# Patient Record
Sex: Female | Born: 1935
Health system: Southern US, Community
[De-identification: ages and names within clinical notes are randomized; demographics above are authoritative.]

## PROBLEM LIST (undated history)

## (undated) DIAGNOSIS — G4733 Obstructive sleep apnea (adult) (pediatric): Secondary | ICD-10-CM

## (undated) DIAGNOSIS — M069 Rheumatoid arthritis, unspecified: Secondary | ICD-10-CM

## (undated) DIAGNOSIS — M810 Age-related osteoporosis without current pathological fracture: Secondary | ICD-10-CM

## (undated) DIAGNOSIS — Z7901 Long term (current) use of anticoagulants: Secondary | ICD-10-CM

## (undated) DIAGNOSIS — Z8679 Personal history of other diseases of the circulatory system: Secondary | ICD-10-CM

## (undated) DIAGNOSIS — F419 Anxiety disorder, unspecified: Secondary | ICD-10-CM

## (undated) DIAGNOSIS — E119 Type 2 diabetes mellitus without complications: Secondary | ICD-10-CM

## (undated) DIAGNOSIS — Z8619 Personal history of other infectious and parasitic diseases: Secondary | ICD-10-CM

## (undated) DIAGNOSIS — J45909 Unspecified asthma, uncomplicated: Secondary | ICD-10-CM

## (undated) DIAGNOSIS — K279 Peptic ulcer, site unspecified, unspecified as acute or chronic, without hemorrhage or perforation: Secondary | ICD-10-CM

## (undated) DIAGNOSIS — I209 Angina pectoris, unspecified: Secondary | ICD-10-CM

## (undated) DIAGNOSIS — Z950 Presence of cardiac pacemaker: Secondary | ICD-10-CM

## (undated) DIAGNOSIS — E785 Hyperlipidemia, unspecified: Secondary | ICD-10-CM

## (undated) DIAGNOSIS — I4891 Unspecified atrial fibrillation: Secondary | ICD-10-CM

## (undated) DIAGNOSIS — F32A Depression, unspecified: Secondary | ICD-10-CM

## (undated) DIAGNOSIS — R0600 Dyspnea, unspecified: Secondary | ICD-10-CM

## (undated) DIAGNOSIS — I509 Heart failure, unspecified: Secondary | ICD-10-CM

## (undated) DIAGNOSIS — I2510 Atherosclerotic heart disease of native coronary artery without angina pectoris: Secondary | ICD-10-CM

## (undated) DIAGNOSIS — I10 Essential (primary) hypertension: Secondary | ICD-10-CM

## (undated) HISTORY — PX: OTHER SURGICAL HISTORY: SHX169

## (undated) HISTORY — DX: Presence of cardiac pacemaker: Z95.0

## (undated) HISTORY — PX: PARTIAL GASTRECTOMY: SHX2172

## (undated) HISTORY — PX: APPENDECTOMY: SHX54

## (undated) HISTORY — PX: ABDOMINAL HYSTERECTOMY: SHX81

## (undated) HISTORY — PX: CHOLECYSTECTOMY: SHX55

## (undated) HISTORY — DX: Personal history of other diseases of the circulatory system: Z86.79

## (undated) HISTORY — PX: ANKLE SURGERY: SHX546

## (undated) HISTORY — DX: Unspecified asthma, uncomplicated: J45.909

## (undated) HISTORY — PX: REPLACEMENT TOTAL KNEE: SUR1224

## (undated) HISTORY — DX: Age-related osteoporosis without current pathological fracture: M81.0

## (undated) HISTORY — DX: Essential (primary) hypertension: I10

## (undated) HISTORY — DX: Type 2 diabetes mellitus without complications: E11.9

## (undated) HISTORY — DX: Peptic ulcer, site unspecified, unspecified as acute or chronic, without hemorrhage or perforation: K27.9

## (undated) HISTORY — DX: Rheumatoid arthritis, unspecified: M06.9

## (undated) HISTORY — DX: Hyperlipidemia, unspecified: E78.5

## (undated) HISTORY — DX: Unspecified atrial fibrillation: I48.91

## (undated) HISTORY — DX: Atherosclerotic heart disease of native coronary artery without angina pectoris: I25.10

---

## 2010-06-25 LAB — PACEMAKER DEVICE OBSERVATION

## 2011-05-16 LAB — BASIC METABOLIC PANEL
BUN: 19 mg/dL (ref 4–21)
Creatinine: 1.2 mg/dL — AB (ref <=1.1)

## 2011-05-16 LAB — LIPID PANEL
Cholesterol: 218 mg/dL — AB (ref 0–200)
HDL: 53 mg/dL (ref 35–70)

## 2011-09-27 ENCOUNTER — Encounter: Payer: Self-pay | Admitting: Family Medicine

## 2011-09-27 ENCOUNTER — Ambulatory Visit (INDEPENDENT_AMBULATORY_CARE_PROVIDER_SITE_OTHER): Payer: Medicare Other | Admitting: Family Medicine

## 2011-09-27 VITALS — BP 118/73 | HR 73 | Temp 98.5°F | Resp 18 | Wt 229.0 lb

## 2011-09-27 DIAGNOSIS — M069 Rheumatoid arthritis, unspecified: Secondary | ICD-10-CM

## 2011-09-27 DIAGNOSIS — I4891 Unspecified atrial fibrillation: Secondary | ICD-10-CM

## 2011-09-27 DIAGNOSIS — I2510 Atherosclerotic heart disease of native coronary artery without angina pectoris: Secondary | ICD-10-CM | POA: Insufficient documentation

## 2011-09-27 DIAGNOSIS — M810 Age-related osteoporosis without current pathological fracture: Secondary | ICD-10-CM

## 2011-09-27 DIAGNOSIS — Z8739 Personal history of other diseases of the musculoskeletal system and connective tissue: Secondary | ICD-10-CM

## 2011-09-27 DIAGNOSIS — G4730 Sleep apnea, unspecified: Secondary | ICD-10-CM

## 2011-09-27 DIAGNOSIS — R509 Fever, unspecified: Secondary | ICD-10-CM

## 2011-09-27 DIAGNOSIS — E785 Hyperlipidemia, unspecified: Secondary | ICD-10-CM

## 2011-09-27 DIAGNOSIS — Z8679 Personal history of other diseases of the circulatory system: Secondary | ICD-10-CM

## 2011-09-27 DIAGNOSIS — I10 Essential (primary) hypertension: Secondary | ICD-10-CM

## 2011-09-27 DIAGNOSIS — Z950 Presence of cardiac pacemaker: Secondary | ICD-10-CM

## 2011-09-27 DIAGNOSIS — N390 Urinary tract infection, site not specified: Secondary | ICD-10-CM

## 2011-09-27 DIAGNOSIS — I4821 Permanent atrial fibrillation: Secondary | ICD-10-CM | POA: Insufficient documentation

## 2011-09-27 HISTORY — DX: Age-related osteoporosis without current pathological fracture: M81.0

## 2011-09-27 HISTORY — DX: Essential (primary) hypertension: I10

## 2011-09-27 HISTORY — DX: Rheumatoid arthritis, unspecified: M06.9

## 2011-09-27 HISTORY — DX: Atherosclerotic heart disease of native coronary artery without angina pectoris: I25.10

## 2011-09-27 HISTORY — DX: Unspecified atrial fibrillation: I48.91

## 2011-09-27 HISTORY — DX: Personal history of other diseases of the circulatory system: Z86.79

## 2011-09-27 HISTORY — DX: Sleep apnea, unspecified: G47.30

## 2011-09-27 HISTORY — DX: Hyperlipidemia, unspecified: E78.5

## 2011-09-27 LAB — POCT URINALYSIS DIPSTICK
Bilirubin, UA: NEGATIVE
Nitrite, UA: NEGATIVE
Protein, UA: 100
pH, UA: 6

## 2011-09-27 MED ORDER — ONDANSETRON 4 MG PO TBDP: 4.0000 mg | ORAL_TABLET | Freq: Three times a day (TID) | ORAL | Status: AC | PRN

## 2011-09-27 MED ORDER — NITROFURANTOIN MONOHYD MACRO 100 MG PO CAPS: 100.0000 mg | ORAL_CAPSULE | Freq: Two times a day (BID) | ORAL | Status: AC

## 2011-09-27 NOTE — Progress Notes (Signed)
CC: Elisama Cervantes is a 78 y.o. female is here for Diarrhea and Fatigue   Subjective: HPI: Patient and the area, former patient of Dr. Marquita Palms in [facility]. Accompanied by her daughter. Complaints of fever as high as 102.0, diarrhea, decreased appetite, mild nausea, pain in the low abdomen, and urination "feels like it should be uncomfortable". Daughter describes the patient being more sleepy and somewhat confused. This is been lasting for last 3 days and today seems somewhat better from a cognitive standpoint but other symptoms persist. No interventions as of yet. Nothing seems to make better or worse. Daughter suspicious of urinary tract infection based on past symptoms with urinary tract infections. Patient denies motor or sensory disturbances, chest pain, shortness of breath, skin breakdown or rash, blood in stool, back pain. The patient denies being in a medical facility or community in the last 2 weeks.  Past medical history is mostly unknown by the patient and the daughter, additionally they're somewhat confused about what medications she takes however we're able to get this from the local pharmacy.  There unsure about the specifics with respect to the Levaquin allergy, however she states penicillin put her in a coma when she was 17. She's not been on a recent antibiotics.   Review Of Systems Outlined In HPI  History reviewed. No pertinent past medical history.   History reviewed. No pertinent family history.   History  Substance Use Topics  . Smoking status: Not on file  . Smokeless tobacco: Not on file  . Alcohol Use:      Objective: Filed Vitals:   09/27/11 0948  BP: 118/73  Pulse: 73  Temp: 98.5 F (36.9 C)  Resp: 18    General: Alert and Oriented, No Acute Distress HEENT: Pupils equal, round, reactive to light. Conjunctivae clear.  External ears unremarkable, canals clear with intact TMs with appropriate landmarks.  Middle ear appears open without  effusion. Pink inferior turbinates.  Moist mucous membranes, pharynx without inflammation nor lesions.  Neck supple without palpable lymphadenopathy nor abnormal masses. 3 cm vertical-linear scar over the right temporal artery. Lungs: Clear to auscultation bilaterally, no wheezing/ronchi/rales.  Comfortable work of breathing. Good air movement. Cardiac: Regular rate and rhythm. Normal S1/S2.  No murmurs, rubs, nor gallops.   Abdomen: Normal bowel sounds, soft and mild suprapubic tenderness without palpable masses. Extremities: No peripheral edema.  Strong peripheral pulses.  Mental Status: No depression, anxiety, nor agitation. Mild confusion with respect to recent events. Skin: Warm and dry.  Assessment & Plan: Jayani was seen today for diarrhea and fatigue.  Diagnoses and associated orders for this visit:  Fever - POCT urinalysis dipstick  Uti (lower urinary tract infection) - nitrofurantoin, macrocrystal-monohydrate, (MACROBID) 100 MG capsule; Take 1 capsule (100 mg total) by mouth 2 (two) times daily. - ondansetron (ZOFRAN ODT) 4 MG disintegrating tablet; Take 1 tablet (4 mg total) by mouth every 8 (eight) hours as needed for nausea. - Urine culture   Urine dipstick shows large blood and leukocytes. We'll send for culture. Antibiotics complicate compared therapy therefore Macrobid until sensitivities none, patient believes fosfomycin caused complications in the past. Will call patient on Monday with culture results and sensitivities. Zofran for nausea to ensure she takes the above antibiotic.Signs and symptoms requring emergent/urgent reevaluation were discussed with the patient. 30 minutes spent in face-to-face visit today of which at least 50% was counseling or coordinating care.    Return if symptoms worsen or fail to improve.  Requested Prescriptions  Signed Prescriptions Disp Refills  . nitrofurantoin, macrocrystal-monohydrate, (MACROBID) 100 MG capsule 14 capsule 0    Sig:  Take 1 capsule (100 mg total) by mouth 2 (two) times daily.  . ondansetron (ZOFRAN ODT) 4 MG disintegrating tablet 20 tablet 0    Sig: Take 1 tablet (4 mg total) by mouth every 8 (eight) hours as needed for nausea.

## 2011-09-29 LAB — URINE CULTURE: Colony Count: >=100000

## 2011-09-30 ENCOUNTER — Telehealth: Payer: Self-pay | Admitting: Family Medicine

## 2011-09-30 MED ORDER — FOSFOMYCIN TROMETHAMINE 3 G PO PACK: 3.0000 g | PACK | Freq: Once | ORAL | Status: DC

## 2011-09-30 NOTE — Telephone Encounter (Signed)
I spoke with her daughter to advise her about the prescription. She states the medication needs a prior auth.

## 2011-09-30 NOTE — Telephone Encounter (Signed)
Marylene Land, I'm able to e-scribe now so I sent this to [facility] pharmacy, would you please let the patient or her daughter know.  Thank you very much.

## 2011-09-30 NOTE — Telephone Encounter (Signed)
Discussed with daughter that Levie is feeling somewhat better and that the urine culture showed multiple species.  Encouraged patient last week and today to use fosfomycin if not significantly improved over the weekend.  Daughter requesting Rx today in order to price it.

## 2011-10-01 ENCOUNTER — Ambulatory Visit (INDEPENDENT_AMBULATORY_CARE_PROVIDER_SITE_OTHER): Payer: Medicare Other | Admitting: Family Medicine

## 2011-10-01 ENCOUNTER — Encounter: Payer: Self-pay | Admitting: Family Medicine

## 2011-10-01 VITALS — BP 126/64 | HR 64 | Temp 98.2°F | Resp 18

## 2011-10-01 DIAGNOSIS — B029 Zoster without complications: Secondary | ICD-10-CM | POA: Insufficient documentation

## 2011-10-01 DIAGNOSIS — N390 Urinary tract infection, site not specified: Secondary | ICD-10-CM

## 2011-10-01 MED ORDER — ACYCLOVIR 400 MG PO TABS: 800.0000 mg | ORAL_TABLET | Freq: Every day | ORAL | Status: AC

## 2011-10-01 NOTE — Progress Notes (Signed)
CC: Katherine Cervantes is a 78 y.o. female is here for Fatigue   Subjective: HPI:  Patient presents for followup of urinary tract infection was diagnosed last week. Due to complicating allergies she was given Macrobid while pending the culture unfortunately the culture was unable to speciate a single bacteria. She reports that the prior nausea, diarrhea, high fevers are now resolved. Additionally flank pain has improved but is still mildly present. She reports low-grade fevers without any objective measurements. She admits that she was quite confused at her last visit and feels that her mentation has improved. I called her former PCP Dr. blank and after talking with his his assistant confirmed that she's not a candidate for Cipro, cephalosporins, sulfas, penicillins, date uncertain reactions in the past that the patient describes as all life threatening. The past 2 days he been waiting for her insurance company to help [place] with a prior authorization for fosfomycin.  She denies chills, shortness of breath, confusion, or dizziness, nausea, dysuria, or change in color or odor consistency of her urine.    she brings to my attention ulcers on her right upper lip I cannot know where 2 days ago. They described as a tingly irritation. She is never had a similar rash before but admits to having shingles many years ago. She is taking methotrexate but no other immunosuppression. She denies any trauma to the site of the these lesions. She denies any pain in her mouth, pain in her ears, ocular pain, nor new neuro or motor or sensory disturbances.   Review Of Systems Outlined In HPI  No past medical history on file.   No family history on file.   History  Substance Use Topics  . Smoking status: Never Smoker   . Smokeless tobacco: Never Used  . Alcohol Use: Not on file     Objective: Filed Vitals:   10/01/11 1145  BP: 126/64  Pulse: 64  Temp: 98.2 F (36.8 C)  Resp: 18    General: Alert and  Oriented, No Acute Distress HEENT: Pupils equal, round, reactive to light. Conjunctivae clear.  External ears unremarkable, canals clear with intact TMs with appropriate landmarks.  Middle ear appears open without effusion. Pink inferior turbinates.  Moist mucous membranes, pharynx without inflammation nor lesions.  Neck supple without palpable lymphadenopathy nor abnormal masses. Right upper lip has clusters of vesicular lesions with erythematous bases not involving the lip. Lungs: Clear to auscultation bilaterally, no wheezing/ronchi/rales.  Comfortable work of breathing. Good air movement. Cardiac: Irregularly irregular rhythm. Normal S1/S2.  No murmurs, rubs, nor gallops.   Back: no midline tenderness, mild CVA tenderness  Mental Status: No depression, anxiety, nor agitation. Alert and oriented to person/place/time Neuro: CN II-XII grossly intact Skin: Warm and dry.  Assessment & Plan: Katherine Cervantes was seen today for fatigue.  Diagnoses and associated orders for this visit:  Shingles outbreak - acyclovir (ZOVIRAX) 400 MG tablet; Take 2 tablets (800 mg total) by mouth 5 (five) times daily.  Uti (lower urinary tract infection)    encourage the patient to consider cath urine culture in order to attempt to speciate and sens to possibly urinary pathogen, discussed but since she's on Macrobid this may complicate the culture. Expressed my concern of lack of antibiotics to use an outpatient and that can guarantee if we get fosfomycin cleared for her that this would treat her 100%. She declined the cath is agreeable to going to the emergency room for any decline in her health which was outlined  in the patient's instruction handout. Additionally discussed with her the diagnosis of shingles in signs and symptoms that require emergent evaluation given her methotrexate use, patient expresses understanding and agreement. Asked her to return on Friday to see how she is progressing before the weekend. Of note  patient's cognitive ability is much improved at today's visit she drastically shows much more insight into her health conditions acutely and chronically.  Return in about 3 days (around 10/04/2011).  Requested Prescriptions   Signed Prescriptions Disp Refills  . acyclovir (ZOVIRAX) 400 MG tablet 70 tablet 0    Sig: Take 2 tablets (800 mg total) by mouth 5 (five) times daily.

## 2011-10-01 NOTE — Telephone Encounter (Signed)
Medication has been approved and patient's daughter is aware.

## 2011-10-01 NOTE — Patient Instructions (Addendum)
Start acyclovir as soon as possible for a total of seven days to help with the rash (shingles) on your upper lip.  If you develop a rash near or on your eye, or approaching your eye, report to the emergency room.  Hold off on methotrexate this week.  If you develop a fever > 100.3, vomiting, confusion, or worsening back/side pain it is imperative that you report to the emergency room because I worry that your UTI could potentially be entering your blood stream.  If we get clearance to Rx you fosfomycin I will call this in ASAP, for now continue with Macrobid.

## 2011-10-04 ENCOUNTER — Ambulatory Visit (INDEPENDENT_AMBULATORY_CARE_PROVIDER_SITE_OTHER): Payer: Medicare Other | Admitting: Family Medicine

## 2011-10-04 ENCOUNTER — Encounter: Payer: Self-pay | Admitting: Family Medicine

## 2011-10-04 VITALS — BP 123/67

## 2011-10-04 DIAGNOSIS — M069 Rheumatoid arthritis, unspecified: Secondary | ICD-10-CM

## 2011-10-04 DIAGNOSIS — N12 Tubulo-interstitial nephritis, not specified as acute or chronic: Secondary | ICD-10-CM

## 2011-10-04 MED ORDER — TRAMADOL-ACETAMINOPHEN 37.5-325 MG PO TABS: 1.0000 | ORAL_TABLET | Freq: Three times a day (TID) | ORAL | Status: DC | PRN

## 2011-10-04 NOTE — Patient Instructions (Signed)
I strongly encourage you to go to the Emergency Room for a possible CT Scan to rule out the possibility of pyelonephritis, I'm limited about what antibiotics I can treat you with as an outpatient due to your allergy list.     Pyelonephritis, Adult Pyelonephritis is a kidney infection. In general, there are 2 main types of pyelonephritis:  Infections that come on quickly without any warning (acute pyelonephritis).   Infections that persist for a long period of time (chronic pyelonephritis).  CAUSES  Two main causes of pyelonephritis are:  Bacteria traveling from the bladder to the kidney. This is a problem especially in pregnant women. The urine in the bladder can become filled with bacteria from multiple causes, including:   Inflammation of the prostate gland (prostatitis).   Sexual intercourse in females.   Bladder infection (cystitis).   Bacteria traveling from the bloodstream to the tissue part of the kidney.  Problems that may increase your risk of getting a kidney infection include:  Diabetes.   Kidney stones or bladder stones.   Cancer.   Catheters placed in the bladder.   Other abnormalities of the kidney or ureter.  SYMPTOMS   Abdominal pain.   Pain in the side or flank area.   Fever.   Chills.   Upset stomach.   Blood in the urine (dark urine).   Frequent urination.   Strong or persistent urge to urinate.   Burning or stinging when urinating.  DIAGNOSIS  Your caregiver may diagnose your kidney infection based on your symptoms. A urine sample may also be taken. TREATMENT  In general, treatment depends on how severe the infection is.   If the infection is mild and caught early, your caregiver may treat you with oral antibiotics and send you home.   If the infection is more severe, the bacteria may have gotten into the bloodstream. This will require intravenous (IV) antibiotics and a hospital stay. Symptoms may include:   High fever.   Severe  flank pain.   Shaking chills.   Even after a hospital stay, your caregiver may require you to be on oral antibiotics for a period of time.   Other treatments may be required depending upon the cause of the infection.  HOME CARE INSTRUCTIONS   Take your antibiotics as directed. Finish them even if you start to feel better.   Make an appointment to have your urine checked to make sure the infection is gone.   Drink enough fluids to keep your urine clear or pale yellow.   Take medicines for the bladder if you have urgency and frequency of urination as directed by your caregiver.  SEEK IMMEDIATE MEDICAL CARE IF:   You have a fever.   You are unable to take your antibiotics or fluids.   You develop shaking chills.   You experience extreme weakness or fainting.   There is no improvement after 2 days of treatment.  MAKE SURE YOU:  Understand these instructions.   Will watch your condition.   Will get help right away if you are not doing well or get worse.  Document Released: 01/28/2005 Document Revised: 01/17/2011 Document Reviewed: 07/04/2010 [facility] Patient Information 2012 [place], [place].

## 2011-10-04 NOTE — Progress Notes (Signed)
CC: Katherine Cervantes is a 78 y.o. female is here for Back Pain   Subjective: HPI:  Patient presents with her daughter Katherine Cervantes, for followup urinary tract infection. Initially treated last week with Macrobid, unfortunately the culture contained multiple species and Katherine Cervantes have reservations about her repeat culture by catheterization when she returned on Tuesday of this week. We're able to get her a dose of fosfomycin which she took on Tuesday. On Tuesday she reported symptomatically improvement including lack of sweats improve cognition but still had some bilateral flank pain. Daughter brings her in today at the patient's cognition has been waxing and waning since Tuesday, she's been experiencing night sweats, has been getting chills throughout the day. Patient reports her flank pain is somewhat worse, present 24 hours a day, and often makes her want to just lie down. She denies any spasm component to her flank pain. She's unsure of any change in the color or consistency of her urine. She denies any shortness of breath or pulmonary complaints, chest pain, abdominal pain, GI distress, nor any new skin lesions.  She requests a refill on Ultracet for her rheumatoid arthritis. She's not taking methotrexate for now is a believe she has an active infection.   Review Of Systems Outlined In HPI  No past medical history on file.   No family history on file.   History  Substance Use Topics  . Smoking status: Never Smoker   . Smokeless tobacco: Never Used  . Alcohol Use: Not on file     Objective: Filed Vitals:   10/04/11 1133  BP: 123/67    General: Alert and Oriented, No Acute Distress HEENT: Pupils equal, round, reactive to light. Conjunctivae clear. Moist mucous membranes, pharynx without inflammation nor lesions.  Neck supple without palpable lymphadenopathy nor abnormal masses. Lungs: Clear to auscultation bilaterally, no wheezing/ronchi/rales.  Comfortable work of breathing. Good air  movement. Cardiac: Irregularly irregular rhythm. Normal S1/S2.  No murmurs, rubs, nor gallops.   Abdomen: Normal bowel sounds, soft and non tender without palpable masses. Extremities: 1+ pitting edema in ankles bilaterally..  Strong peripheral pulses.  Back: CVA tenderness bilaterally, no midline tenderness, no soft tissue tenderness to firm palpation in the paraspinal muscles. Mental Status: No depression, anxiety, nor agitation. Skin: Warm and dry.  Assessment & Plan: Katherine Cervantes was seen today for back pain.  Diagnoses and associated orders for this visit:  Pyelonephritis  Rheumatoid arthritis - traMADol-acetaminophen (ULTRACET) 37.5-325 MG per tablet; Take 1 tablet by mouth every 8 (eight) hours as needed (arthritis pain).    Had a lengthy discussion with Katherine Cervantes in the patient regarding my concerns for pyelonephritis and lack of outpatient medications that would be successful in treating this given her extensive allergy list. I like to get a CT scan to confirm this diagnosis however my clinical suspicion of pyelonephritis is high enough that I believe she warrants inpatient therapy, therefore I strongly encouraged the patient and her daughter to go to the emergency room for urgent evaluation. The patient is stable enough to go by private vehicle, I stressed the importance of antimicrobial therapy in the setting of pyelonephritis in order to to avoid bacteremia and a rapid decline in health. Patient does express understanding however are contemplating whether or not to go today versus tomorrow. I strongly encouraged Katherine Cervantes to get there this afternoon. Regardless if not hospitalized followup with me next week.  Return in about 1 week (around 10/11/2011).  Requested Prescriptions   Signed Prescriptions Disp Refills  .  traMADol-acetaminophen (ULTRACET) 37.5-325 MG per tablet 30 tablet 3    Sig: Take 1 tablet by mouth every 8 (eight) hours as needed (arthritis pain).

## 2011-10-07 ENCOUNTER — Encounter: Payer: Self-pay | Admitting: Family Medicine

## 2011-10-07 ENCOUNTER — Telehealth: Payer: Self-pay

## 2011-10-07 DIAGNOSIS — N200 Calculus of kidney: Secondary | ICD-10-CM

## 2011-10-07 NOTE — Telephone Encounter (Signed)
Patient's daughter aware. Dr Dorothe Pea tried calling the patient at home, there was no answer.

## 2011-10-07 NOTE — Telephone Encounter (Signed)
I'll put in a referral (right now) for a nephrologist within our system.

## 2011-10-07 NOTE — Telephone Encounter (Signed)
Patient has been released from the hospital with a diagnosis of kidney stones. They want her to see a Nephrologist in [place] and she would prefer someone local. Please advise.

## 2011-10-09 ENCOUNTER — Telehealth: Payer: Self-pay | Admitting: Family Medicine

## 2011-10-09 DIAGNOSIS — N200 Calculus of kidney: Secondary | ICD-10-CM

## 2011-10-09 NOTE — Telephone Encounter (Signed)
Katherine Cervantes from [facility] called regarding this patient's referral and said that their Dr. Has reviewed this patient info and thinks we should start with a urology referral. Please advise

## 2011-10-10 ENCOUNTER — Encounter: Payer: Self-pay | Admitting: Family Medicine

## 2011-10-10 NOTE — Telephone Encounter (Signed)
Good morning Katherine Cervantes, The urology referral has been placed, I've listed it as urgent, thank you for the update. -Gregary Signs

## 2011-11-04 ENCOUNTER — Encounter: Payer: Self-pay | Admitting: Family Medicine

## 2011-11-04 DIAGNOSIS — K5790 Diverticulosis of intestine, part unspecified, without perforation or abscess without bleeding: Secondary | ICD-10-CM

## 2011-11-04 HISTORY — DX: Diverticulosis of intestine, part unspecified, without perforation or abscess without bleeding: K57.90

## 2011-12-25 ENCOUNTER — Other Ambulatory Visit: Payer: Self-pay | Admitting: *Deleted

## 2011-12-25 MED ORDER — METHOTREXATE 2.5 MG PO TABS: ORAL_TABLET | ORAL | Status: DC

## 2012-05-21 ENCOUNTER — Other Ambulatory Visit: Payer: Self-pay | Admitting: Family Medicine

## 2012-05-22 ENCOUNTER — Telehealth: Payer: Self-pay | Admitting: Family Medicine

## 2012-05-22 MED ORDER — DIGOXIN 125 MCG PO TABS: 0.1250 mg | ORAL_TABLET | Freq: Every day | ORAL | Status: DC

## 2012-05-22 NOTE — Telephone Encounter (Signed)
Med request

## 2012-05-26 ENCOUNTER — Encounter: Payer: Self-pay | Admitting: Family Medicine

## 2012-05-26 ENCOUNTER — Telehealth: Payer: Self-pay | Admitting: *Deleted

## 2012-05-26 ENCOUNTER — Ambulatory Visit (INDEPENDENT_AMBULATORY_CARE_PROVIDER_SITE_OTHER): Payer: Medicare Other | Admitting: Family Medicine

## 2012-05-26 VITALS — BP 150/78 | HR 75 | Wt 219.0 lb

## 2012-05-26 DIAGNOSIS — L309 Dermatitis, unspecified: Secondary | ICD-10-CM

## 2012-05-26 DIAGNOSIS — R300 Dysuria: Secondary | ICD-10-CM

## 2012-05-26 DIAGNOSIS — L259 Unspecified contact dermatitis, unspecified cause: Secondary | ICD-10-CM

## 2012-05-26 DIAGNOSIS — H547 Unspecified visual loss: Secondary | ICD-10-CM

## 2012-05-26 DIAGNOSIS — Z950 Presence of cardiac pacemaker: Secondary | ICD-10-CM

## 2012-05-26 DIAGNOSIS — I10 Essential (primary) hypertension: Secondary | ICD-10-CM

## 2012-05-26 DIAGNOSIS — I4891 Unspecified atrial fibrillation: Secondary | ICD-10-CM

## 2012-05-26 DIAGNOSIS — I2510 Atherosclerotic heart disease of native coronary artery without angina pectoris: Secondary | ICD-10-CM

## 2012-05-26 DIAGNOSIS — M069 Rheumatoid arthritis, unspecified: Secondary | ICD-10-CM

## 2012-05-26 LAB — POCT URINALYSIS DIPSTICK
Bilirubin, UA: NEGATIVE
Leukocytes, UA: NEGATIVE
Nitrite, UA: NEGATIVE
Protein, UA: 30
pH, UA: 5.5

## 2012-05-26 MED ORDER — PRAVASTATIN SODIUM 10 MG PO TABS: 10.0000 mg | ORAL_TABLET | Freq: Every evening | ORAL | Status: DC

## 2012-05-26 MED ORDER — METOPROLOL SUCCINATE ER 50 MG PO TB24: 50.0000 mg | ORAL_TABLET | Freq: Every day | ORAL | Status: DC

## 2012-05-26 MED ORDER — TRIAMCINOLONE ACETONIDE 0.1 % EX CREA: TOPICAL_CREAM | Freq: Two times a day (BID) | CUTANEOUS | Status: DC

## 2012-05-26 NOTE — Progress Notes (Signed)
CC: Katherine Cervantes is a 79 y.o. female is here for Hypertension   Subjective: HPI:  Followup hypertension: Home health reports blood pressure 180/100 home last week. Patient tells me she stopped taking metoprolol to 2 fears it was causing her hair to fall out. This decision was made months ago. Since then she denies headaches, motor sensory disturbances, chest pain, shortness of breath, irregular heartbeat, orthopnea, peripheral edema.  She informs me that since I saw her last she had a pacemaker replaced in March for a history of atrial fibrillation and congestive heart failure. She has run out digoxin a refill was submitted yesterday. She's requesting a cardiology referral specifically Katherine Cervantes. She has a history of coronary artery disease and has decided to stop taking Plavix and aspirin do to an intolerance but she can't remember at the present time. Her former provider put her on WelChol however she cannot afford this.  She informs me that she has been asked to have a dilated eye exam, she is trouble reading objects up close such as reading material but no other vision loss or ocular complaints but is requesting ophthalmology referral.  Patient complains of dysuria that has been present for the last week. She was seen her former PCP and started on an antibiotic which she stopped after completing the course last week. Since then she's had a fishy odor to her urine with dysuria of the urethra only with urinating. Denies pelvic pain nor vaginal discharge nor GI complaints.   Review Of Systems Outlined In HPI  Past Medical History  Diagnosis Date  . Dyslipidemia 09/27/2011  . Hypertension 09/27/2011  . CAD (coronary artery disease) 09/27/2011  . Atrial fibrillation 09/27/2011  . History of vasculitis 09/27/2011  . Rheumatoid arthritis 09/27/2011  . Osteoporosis 09/27/2011     No family history on file.   History  Substance Use Topics  . Smoking status: Never Smoker   . Smokeless  tobacco: Never Used  . Alcohol Use: Not on file     Objective: Filed Vitals:   05/26/12 0824  BP: 150/78  Pulse: 75    Vital signs reviewed. General: Alert and Oriented, No Acute Distress HEENT: Pupils equal, round, reactive to light. Conjunctivae clear.  External ears unremarkable.  Moist mucous membranes. Lungs: Clear and comfortable work of breathing, speaking in full sentences without accessory muscle use. No wheezing rhonchi nor rales Cardiac: Regular rate and rhythm. No murmurs nor carotid bruits Neuro: CN II-XII grossly intact, gait normal. Extremities: 1+ nonpitting peripheral edema bilaterally of the ankles..  Strong peripheral pulses.  There is a 3 x 1 cm football shaped patch of erythema on the anterior ankle which is nontender Mental Status: No depression, anxiety, nor agitation. Logical though process. Skin: Warm and dry.  Assessment & Plan: Katherine Cervantes was seen today for hypertension.  Diagnoses and associated orders for this visit:  Dysuria - Urine culture - Urinalysis Dipstick  Hypertension - metoprolol succinate (TOPROL XL) 50 MG 24 hr tablet; Take 1 tablet (50 mg total) by mouth daily. Take with or immediately following a meal.  Atrial fibrillation - Ambulatory referral to Cardiology - metoprolol succinate (TOPROL XL) 50 MG 24 hr tablet; Take 1 tablet (50 mg total) by mouth daily. Take with or immediately following a meal.  CAD (coronary artery disease) - Ambulatory referral to Cardiology - metoprolol succinate (TOPROL XL) 50 MG 24 hr tablet; Take 1 tablet (50 mg total) by mouth daily. Take with or immediately following a meal. - pravastatin (  PRAVACHOL) 10 MG tablet; Take 1 tablet (10 mg total) by mouth every evening.  Pacemaker - Ambulatory referral to Cardiology  Vision loss - Ambulatory referral to Ophthalmology  Dermatitis - triamcinolone cream (KENALOG) 0.1 %; Apply topically 2 (two) times daily.    Dysuria: Will obtain culture, urinalysis with  blood therefore culture negative will workup possibility of microscopic hematuria Hypertension: Uncontrolled, restart metoprolol, reassured patient unlikely that this was causing hair loss Atrial fibrillation: Cardiology referral per her request. Currently controlled, we'll use metoprolol as further rate control if needed. Coronary artery disease: Stable, I've asked her to provide with records citing intolerance of her aspirin and/or Plavix as she would greatly benefit from taking either one of these if tolerated. Discussed her benefit of being on a statin therefore we'll start pravastatin to replace welchol Vision loss: Per request patient ophthalmology referral Dermatitis: Apply triamcinolone to right anterior ankle twice a day for 2 weeks  25 minutes spent face-to-face during visit today of which at least 50% was counseling or coordinating care regarding atrial fibrillation, coronary artery disease, vision loss, dermatitis, hypertension, dysuria.   Return in about 4 weeks (around 06/23/2012).

## 2012-05-26 NOTE — Telephone Encounter (Signed)
I'd like her to stop the welchol, pravastatin/pravachol will be a substitute for this.

## 2012-05-26 NOTE — Telephone Encounter (Signed)
Pt called and wanted to know if you were going to rx Welchol for her or was she supposed to get this from Dr. Bronwen Betters? I didn't see welchol on her current of past med list.only pravachol

## 2012-05-27 MED ORDER — TRAMADOL-ACETAMINOPHEN 37.5-325 MG PO TABS: 1.0000 | ORAL_TABLET | Freq: Three times a day (TID) | ORAL | Status: DC | PRN

## 2012-05-27 NOTE — Telephone Encounter (Signed)
Pt notified.pt request a refill for tramodol

## 2012-05-29 LAB — URINE CULTURE: Organism ID, Bacteria: NO GROWTH

## 2012-06-01 ENCOUNTER — Encounter: Payer: Self-pay | Admitting: *Deleted

## 2012-06-24 ENCOUNTER — Ambulatory Visit: Payer: Medicare Other | Admitting: Family Medicine

## 2012-07-17 ENCOUNTER — Other Ambulatory Visit: Payer: Self-pay | Admitting: Family Medicine

## 2012-07-17 DIAGNOSIS — M069 Rheumatoid arthritis, unspecified: Secondary | ICD-10-CM

## 2012-07-31 ENCOUNTER — Ambulatory Visit: Payer: Medicare Other | Admitting: Family Medicine

## 2012-08-26 ENCOUNTER — Other Ambulatory Visit: Payer: Self-pay | Admitting: Family Medicine

## 2012-08-26 NOTE — Telephone Encounter (Signed)
Katherine Cervantes, Can you please clarify if [name] is really requesting torsemide/Demadex, at her last visit in April it was listed that this is no longer being taken and I've never prescribed it for her before.

## 2012-08-28 NOTE — Telephone Encounter (Signed)
Needs f/u appt 

## 2012-11-30 ENCOUNTER — Encounter: Payer: Self-pay | Admitting: Family Medicine

## 2012-11-30 ENCOUNTER — Ambulatory Visit (INDEPENDENT_AMBULATORY_CARE_PROVIDER_SITE_OTHER): Payer: Medicare Other | Admitting: Family Medicine

## 2012-11-30 VITALS — BP 135/73 | HR 60 | Wt 220.0 lb

## 2012-11-30 DIAGNOSIS — E871 Hypo-osmolality and hyponatremia: Secondary | ICD-10-CM

## 2012-11-30 DIAGNOSIS — E119 Type 2 diabetes mellitus without complications: Secondary | ICD-10-CM | POA: Insufficient documentation

## 2012-11-30 MED ORDER — METFORMIN HCL 500 MG PO TABS: ORAL_TABLET | ORAL | Status: DC

## 2012-11-30 NOTE — Progress Notes (Signed)
CC: Katherine Cervantes is a 79 y.o. female is here for Follow-up   Subjective: HPI:  Hospital followup  Seen in emergency room [facility] yesterday due to a skin infection in the groin and while being worked up she was found to have a sugar of 707 after a liter of normal saline and 10 units of insulin blood sugar was brought down to 313. She was discharged on 500 mg of metformin. Patient reports the last 2 months she has had increased thirst, polyphagia and polydipsia. She reports no prior history of elevated blood sugars. She's also complaining of a little bit of blurring in both eyes that has coincided with the above symptoms. She reports fatigue and continuation of above symptoms, she took metformin today has not noticed any side effects as of yet.. she reports she is eating 6 small meals a day but claims she is avoiding starches, pasta, and sweets.  She denies any recent or remote fevers, chills, nausea, abdominal pain, confusion, chest pain, shortness of breath, dysuria  Of note sodium was 125 when sugar was 707 repeat had not been done   Review Of Systems Outlined In HPI  Past Medical History  Diagnosis Date  . Dyslipidemia 09/27/2011  . Hypertension 09/27/2011  . CAD (coronary artery disease) 09/27/2011  . Atrial fibrillation 09/27/2011  . History of vasculitis 09/27/2011  . Rheumatoid arthritis(714.0) 09/27/2011  . Osteoporosis 09/27/2011     History reviewed. No pertinent family history.   History  Substance Use Topics  . Smoking status: Never Smoker   . Smokeless tobacco: Never Used  . Alcohol Use: Not on file     Objective: Filed Vitals:   11/30/12 1438  BP: 135/73  Pulse: 60    General: Alert and Oriented, No Acute Distress HEENT: Pupils equal, round, reactive to light. Conjunctivae clear.  Tacky mucous membranes Lungs: Clear to auscultation bilaterally, no wheezing/ronchi/rales.  Comfortable work of breathing. Good air movement. Cardiac: Regular rate and  rhythm. Normal S1/S2.  No murmurs, rubs, nor gallops.   Abdomen: Obese soft nontender Extremities: No peripheral edema.  Strong peripheral pulses.  Mental Status: No depression, anxiety, nor agitation. Skin: Warm and dry.  Assessment & Plan: Katherine Cervantes was seen today for follow-up.  Diagnoses and associated orders for this visit:  Hyponatremia - Basic Metabolic Panel (BMET)  Type 2 diabetes mellitus - Hemoglobin A1c - Basic Metabolic Panel (BMET) - Ambulatory referral to diabetic education - metFORMIN (GLUCOPHAGE) 500 MG tablet; One by mouth daily, every four days increase by one pill to eventually taking two in the morning and two in the evening.    Type 2 diabetes: Time was taken to answer all the patient's well-thought-out questions, we briefly discussed dietary measures to help keep sugar under control and she was given a folder on diabetic education. I've encouraged her to followup with the diabetic educator referral I placed today. We discussed the titration or metformin as I am sure she'll need a total of 2 g a day, discussed we may need to add further antihypertensive medications in the future based on her response. Encouraged patient to stay well-hydrated would expect polyphagia polydipsia and polyuria to improve with blood sugar control sodium, rechecking glucose and A1c for baseline Hyponatremia: Recheck sodium At the end of the visit patient was asking for me to check her hip, politely asked her to schedule an appointment dedicated to that concern given that we had already used 45 minutes at today's visit  45 minutes spent face-to-face  during visit today of which at least 50% was counseling or coordinating care regarding type 2 diabetes.   Return in about 4 weeks (around 12/28/2012).

## 2012-12-01 LAB — BASIC METABOLIC PANEL
BUN: 16 mg/dL (ref 6–23)
CO2: 23 mEq/L (ref 19–32)
Glucose, Bld: 382 mg/dL — ABNORMAL HIGH (ref 70–99)
Potassium: 4.3 mEq/L (ref 3.5–5.3)
Sodium: 130 mEq/L — ABNORMAL LOW (ref 135–145)

## 2012-12-24 ENCOUNTER — Ambulatory Visit: Payer: Medicare Other | Admitting: Family Medicine

## 2012-12-31 ENCOUNTER — Telehealth: Payer: Self-pay | Admitting: *Deleted

## 2012-12-31 ENCOUNTER — Other Ambulatory Visit: Payer: Self-pay

## 2012-12-31 MED ORDER — DIPHENOXYLATE-ATROPINE 2.5-0.025 MG PO TABS: ORAL_TABLET | ORAL | Status: DC

## 2012-12-31 NOTE — Telephone Encounter (Signed)
Pt has been having diarrhea since  having started metformin. She is now taking two metformin a day.  I did tell pt that this is a common side effect of metformin and a lot of times over time the diarrhea subsides. Pt wanted me to forward this message for recommendations because the diarrhea is unbearable to her right now.Please advise

## 2012-12-31 NOTE — Telephone Encounter (Signed)
Spoke with pt again to let her know about the rx and pt now states she thinks she may have picked up a gi bug. Pt states she feels that since the color of the stool is a yellow color she may have a virus. Pt denies fever or vomiting. Pt states she just has ab cramping and diarrhea. She started taking the metformin twice a day she says on 11/2. I did tell her that metformin can cause cramping and diarrhea. I advised that either way she should stay hydrated she can try to eat food that will bulk up her stools.( foods that are high in fiber) if diarrhea continues through the weekend and/or develops new sxs fever,vomiting or and she is getting dehydrated go to ER for fluids as she has already had some diarrhea for 3 days. If diarrhea remians the same and no other sxs f/u with Dr. Ivan Anchors. Pt agreed and voiced understanding

## 2012-12-31 NOTE — Telephone Encounter (Signed)
I am going to add a prescription for Lomotil, it will be weeding in my box. Keep taking metformin.

## 2013-01-13 ENCOUNTER — Encounter: Payer: Self-pay | Admitting: Family Medicine

## 2013-01-13 ENCOUNTER — Ambulatory Visit (INDEPENDENT_AMBULATORY_CARE_PROVIDER_SITE_OTHER): Payer: Medicare Other | Admitting: Family Medicine

## 2013-01-13 VITALS — BP 143/68 | HR 68 | Wt 206.0 lb

## 2013-01-13 DIAGNOSIS — R220 Localized swelling, mass and lump, head: Secondary | ICD-10-CM

## 2013-01-13 DIAGNOSIS — E119 Type 2 diabetes mellitus without complications: Secondary | ICD-10-CM

## 2013-01-13 DIAGNOSIS — R29818 Other symptoms and signs involving the nervous system: Secondary | ICD-10-CM

## 2013-01-13 DIAGNOSIS — I10 Essential (primary) hypertension: Secondary | ICD-10-CM

## 2013-01-13 DIAGNOSIS — R221 Localized swelling, mass and lump, neck: Secondary | ICD-10-CM

## 2013-01-13 DIAGNOSIS — I2510 Atherosclerotic heart disease of native coronary artery without angina pectoris: Secondary | ICD-10-CM

## 2013-01-13 DIAGNOSIS — R2689 Other abnormalities of gait and mobility: Secondary | ICD-10-CM

## 2013-01-13 DIAGNOSIS — M069 Rheumatoid arthritis, unspecified: Secondary | ICD-10-CM

## 2013-01-13 DIAGNOSIS — I4891 Unspecified atrial fibrillation: Secondary | ICD-10-CM

## 2013-01-13 DIAGNOSIS — E785 Hyperlipidemia, unspecified: Secondary | ICD-10-CM | POA: Insufficient documentation

## 2013-01-13 MED ORDER — METOPROLOL SUCCINATE ER 25 MG PO TB24: 25.0000 mg | ORAL_TABLET | Freq: Every day | ORAL | Status: DC

## 2013-01-13 NOTE — Progress Notes (Signed)
CC: Katherine Cervantes is a 79 y.o. female is here for Diabetes   Subjective: HPI:  Followup type 2 diabetes: Has been taking metformin 1 g twice a day and over the past month denies any GI disturbance such as diarrhea or abdominal discomfort. No outside blood sugars to report. Denies polyuria polyphasia or polydipsia. Vision has slightly improved since starting metformin. Denies poorly healing wounds or skin abnormalities  Followup essential hypertension: She has stopped taking metoprolol because it was causing her hair to fall out. No outside blood pressures to report. She denies chest pain, shortness of breath nor peripheral edema. She does have a history of atrial fibrillation and denies any recent irregular heartbeat or chest pain. She tells me she has recently seen both her former cardiologist and Dr. Leeann Cervantes and she has decided that she does not want anticoagulation.  Her biggest complaint is a balance issue that has been present for the last month. It is described as a unsteadiness that is present only when standing. It is mild in severity present on a daily basis interferes with her confidence when it comes to walking. She denies falls or close calls since I saw her last. She does use a cane for all ambulation. She is independent in all of her activities of daily living  She has decided to stop taking methotrexate for her rheumatoid arthritis. She believes this occurred one to 2 months ago. She denies any new muscle or joint pain since stopping this medication nor joint swelling or redness nor warmth.   Review Of Systems Outlined In HPI  Past Medical History  Diagnosis Date  . Dyslipidemia 09/27/2011  . Hypertension 09/27/2011  . CAD (coronary artery disease) 09/27/2011  . Atrial fibrillation 09/27/2011  . History of vasculitis 09/27/2011  . Rheumatoid arthritis(714.0) 09/27/2011  . Osteoporosis 09/27/2011     History reviewed. No pertinent family history.   History  Substance Use  Topics  . Smoking status: Never Smoker   . Smokeless tobacco: Never Used  . Alcohol Use: Not on file     Objective: Filed Vitals:   01/13/13 1612  BP: 143/68  Pulse: 68    General: Alert and Oriented, No Acute Distress HEENT: Pupils equal, round, reactive to light. Conjunctivae clear.  Moist use membranes pharynx unremarkable. No palpable thyromegaly. In the left supraclavicular space there is a proximally 3.5 cm round soft mass which is nonpulsatile Lungs: Clear to auscultation bilaterally, no wheezing/ronchi/rales.  Comfortable work of breathing. Good air movement. Cardiac: Regular rate and rhythm. Normal S1/S2.  No murmurs, rubs, nor gallops.   Extremities: No peripheral edema.  Strong peripheral pulses.  Mental Status: No depression, anxiety, nor agitation. Skin: Warm and dry.  Assessment & Plan: Katherine Cervantes was seen today for diabetes.  Diagnoses and associated orders for this visit:  Type 2 diabetes mellitus - BASIC METABOLIC PANEL WITH GFR  Atrial fibrillation - metoprolol succinate (TOPROL XL) 25 MG 24 hr tablet; Take 1 tablet (25 mg total) by mouth daily. Take with or immediately following a meal.  CAD (coronary artery disease) - metoprolol succinate (TOPROL XL) 25 MG 24 hr tablet; Take 1 tablet (25 mg total) by mouth daily. Take with or immediately following a meal.  Hypertension - metoprolol succinate (TOPROL XL) 25 MG 24 hr tablet; Take 1 tablet (25 mg total) by mouth daily. Take with or immediately following a meal.  Balance problem - Ambulatory referral to Physical Therapy  Neck mass - US Soft Tissue Head/Neck; Future  Rheumatoid  arthritis    Type 2 diabetes: Currently this is improving, I like her to continue on metformin 1 g twice a day pending a random blood sugar today. Hypokalemia: Checking potassium today to see if we can stop potassium supplementation Coronary artery disease and atrial fibrillation: Clinically controlled I would like her to restart  metoprolol Hypertension: Uncontrolled chronic condition she is agreeable to starting metoprolol again but only at a lower dose than 50 mg which was causing hair loss in her opinion Balance problem: Referring to physical therapy for proprioception training Left neck Mass: Ordering ultrasound soft tissue neck Rheumatoid arthritis: Stable, we discussed risks and benefits of methotrexate, patient currently feels that risks outweigh the benefits which I believe is quite understandable given her age.  40 minutes spent face-to-face during visit today of which at least 50% was counseling or coordinating care regarding type 2 diabetes, hypokalemia, coronary artery disease, hypertension, left neck mass, atrophic relation.   Return in about 4 weeks (around 02/10/2013).

## 2013-01-14 ENCOUNTER — Telehealth: Payer: Self-pay | Admitting: *Deleted

## 2013-01-14 ENCOUNTER — Encounter: Payer: Self-pay | Admitting: Family Medicine

## 2013-01-14 DIAGNOSIS — N289 Disorder of kidney and ureter, unspecified: Secondary | ICD-10-CM

## 2013-01-14 LAB — BASIC METABOLIC PANEL WITH GFR
BUN: 28 mg/dL — ABNORMAL HIGH (ref 6–23)
Chloride: 94 mEq/L — ABNORMAL LOW (ref 96–112)
Creat: 1.27 mg/dL — ABNORMAL HIGH (ref 0.50–1.10)
GFR, Est African American: 46 mL/min — ABNORMAL LOW
GFR, Est Non African American: 40 mL/min — ABNORMAL LOW
Glucose, Bld: 119 mg/dL — ABNORMAL HIGH (ref 70–99)
Potassium: 3.3 mEq/L — ABNORMAL LOW (ref 3.5–5.3)

## 2013-01-14 NOTE — Telephone Encounter (Signed)
Lab order  Printed and faxed downstairs

## 2013-01-15 LAB — BASIC METABOLIC PANEL WITH GFR
BUN: 23 mg/dL (ref 6–23)
CO2: 30 mEq/L (ref 19–32)
Calcium: 9 mg/dL (ref 8.4–10.5)
Chloride: 93 mEq/L — ABNORMAL LOW (ref 96–112)
Creat: 0.93 mg/dL (ref 0.50–1.10)
Glucose, Bld: 130 mg/dL — ABNORMAL HIGH (ref 70–99)

## 2013-01-25 ENCOUNTER — Ambulatory Visit: Payer: Medicare Other | Admitting: Physical Therapy

## 2013-01-26 ENCOUNTER — Encounter: Payer: Self-pay | Admitting: *Deleted

## 2013-01-26 ENCOUNTER — Encounter: Payer: Medicare Other | Attending: Family Medicine | Admitting: *Deleted

## 2013-01-26 VITALS — Ht 60.0 in | Wt 209.8 lb

## 2013-01-26 DIAGNOSIS — Z713 Dietary counseling and surveillance: Secondary | ICD-10-CM | POA: Insufficient documentation

## 2013-01-26 DIAGNOSIS — E119 Type 2 diabetes mellitus without complications: Secondary | ICD-10-CM

## 2013-01-26 NOTE — Progress Notes (Signed)
Appt start time: 1130 end time:  1300.  Assessment:  Patient was seen on  01/26/13 for individual diabetes education. She was diagnosed recently with an A1c of 14.9%. She is here with her oldest daughter, [name], who appears supportive. Recently moved to [place] from [place] to be near her daughter. She lives alone and prepares her own meals. 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 She is not testing her BG yet. She appears familiar with diabetes stating her husband had it prior to passing away in 2002.  Current HbA1c: 14.9% in 11/30/12  Preferred Learning Style:   No preference indicated   Learning Readiness:   Ready  Change in progress  MEDICATIONS: see list, diabetes medication is Metformin  DIETARY INTAKE: 24-hr recall:  B ( AM): hard boiled egg OR 1 slice whole wheat bread OR bagel, coffee with Splenda and creamer Snk ( AM): tries to - fresh fruit  L ( PM): left overs OR salad with vegetables and cooked egg, water Snk ( PM): same as AM D ( PM): lean meat, occasionally a starch, always vegetables, water or coffee, occasionally diet soda Snk ( PM): not typically Beverages: coffee, water, diet soda  Usual physical activity: used to swim, not right now  Estimated energy needs: 1200 calories 135 g carbohydrates 90 g protein 33 g fat    Intervention:  Nutrition counseling provided.  Discussed diabetes disease process and treatment options.  Discussed physiology of diabetes and role of obesity on insulin resistance.  Encouraged moderate weight reduction to improve glucose levels.  Discussed role of medications and diet in glucose control  Provided education on macronutrients on glucose levels.  Provided education on carb counting, importance of regularly scheduled meals/snacks, and meal planning  Discussed effects of physical activity on glucose levels and long-term glucose control.  Recommended she resume her favorite physical  activity of swimming when able  Reviewed patient medications.  Discussed role of medication on blood glucose and possible side effects  Discussed blood glucose monitoring and interpretation.  Discussed recommended target ranges and individual ranges.  Suggested she request Rx from MD for meter and approved strips for her insurance coverage.  Described short-term complications: hyper- and hypo-glycemia.  Discussed causes,symptoms, and treatment options.  Provided information on the following:  Prevention, detection, and treatment of long-term complications.  Discussed the role of prolonged elevated glucose levels on body systems.  Role of stress on blood glucose levels and discussed strategies to manage psychosocial issues.  Recommendations for long-term diabetes self-care.  Established checklist for medical, dental, and emotional self-care.  Plan:  Aim for 2 Carb Choices per meal (30 grams) +/- 1 either way  Aim for 0-1 Carbs per snack if hungry  Consider reading food labels for Total Carbohydrate of foods Continue with your activity level by swimming as tolerated Consider asking your MD about Rx for meter and checking BG at alternate times per day   Teaching Method Utilized: all of the following: Visual Auditory Hands on  Handouts given during visit include: Living Well with Diabetes Carb Counting and Food Label handouts Meal Plan Card  Barriers to learning/adherence to lifestyle change: increasing age and adjusting to new living arrangements  Diabetes self-care support plan:   [facility] support group  Family support  Demonstrated degree of understanding via:  Teach Back   Monitoring/Evaluation:  Dietary intake, exercise, reading food labels, and body weight prn.

## 2013-01-26 NOTE — Patient Instructions (Signed)
Plan:  Aim for 2 Carb Choices per meal (30 grams) +/- 1 either way  Aim for 0-1 Carbs per snack if hungry  Consider reading food labels for Total Carbohydrate of foods Continue with your activity level by swimming as tolerated Consider asking your MD about Rx for meter and checking BG at alternate times per day

## 2013-01-27 ENCOUNTER — Telehealth: Payer: Self-pay | Admitting: *Deleted

## 2013-01-27 NOTE — Telephone Encounter (Signed)
Pt went to her DM education and is asking if you will send rx to [facility] pharmacy for a glucometer and all the other supplies needed for her to check her blood sugar.  Meyer Cory, LPN

## 2013-01-29 MED ORDER — AMBULATORY NON FORMULARY MEDICATION: Status: DC

## 2013-01-29 NOTE — Telephone Encounter (Signed)
Andrea, Rx placed in in-box ready for pickup/faxing.  

## 2013-01-29 NOTE — Telephone Encounter (Signed)
rx have been faxed.

## 2013-02-08 ENCOUNTER — Ambulatory Visit (INDEPENDENT_AMBULATORY_CARE_PROVIDER_SITE_OTHER): Payer: Medicare Other | Admitting: Family Medicine

## 2013-02-08 ENCOUNTER — Ambulatory Visit (INDEPENDENT_AMBULATORY_CARE_PROVIDER_SITE_OTHER): Payer: Medicare Other | Admitting: Physical Therapy

## 2013-02-08 ENCOUNTER — Encounter: Payer: Self-pay | Admitting: Family Medicine

## 2013-02-08 VITALS — BP 182/83 | HR 76 | Wt 221.0 lb

## 2013-02-08 DIAGNOSIS — I10 Essential (primary) hypertension: Secondary | ICD-10-CM

## 2013-02-08 DIAGNOSIS — R220 Localized swelling, mass and lump, head: Secondary | ICD-10-CM

## 2013-02-08 DIAGNOSIS — M25673 Stiffness of unspecified ankle, not elsewhere classified: Secondary | ICD-10-CM

## 2013-02-08 DIAGNOSIS — R29818 Other symptoms and signs involving the nervous system: Secondary | ICD-10-CM

## 2013-02-08 DIAGNOSIS — R2689 Other abnormalities of gait and mobility: Secondary | ICD-10-CM

## 2013-02-08 DIAGNOSIS — R269 Unspecified abnormalities of gait and mobility: Secondary | ICD-10-CM

## 2013-02-08 DIAGNOSIS — M6281 Muscle weakness (generalized): Secondary | ICD-10-CM

## 2013-02-08 DIAGNOSIS — E119 Type 2 diabetes mellitus without complications: Secondary | ICD-10-CM

## 2013-02-08 DIAGNOSIS — R221 Localized swelling, mass and lump, neck: Secondary | ICD-10-CM

## 2013-02-08 NOTE — Progress Notes (Signed)
CC: Katherine Cervantes is a 79 y.o. female is here for Hypertension   Subjective: HPI:  Type 2 DM: Continues on metformin 1 g twice a day without side effects. Fasting blood sugars are ranging in the 90s occasionally 121 has not seen blood sugar higher than this since I saw her last.. Salt diabetic educator patient believes this was very helpful. She's cutting down on portions and carbohydrate intake. Denies polyuria polyphagia polydipsia poorly healing wounds vision loss  Reports she has not taken any of her blood pressure medication this morning. Since restarting metoprolol denies any hair or skin complaints.. Denies any recent chest pain, shortness of breath, orthopnea, peripheral edema, motor sensory disturbances.  Neck mass: She suspects that scheduling called old number that she no longer checks voicemail of. The mass in the left neck has not changed in character severity or size since I saw her last.  Follow balance problem: Since I saw her last she reports no falls or close calls but still uses a cane. First session of physical therapy went extremely well today in her opinion.    Review Of Systems Outlined In HPI  Past Medical History  Diagnosis Date  . Dyslipidemia 09/27/2011  . Hypertension 09/27/2011  . CAD (coronary artery disease) 09/27/2011  . Atrial fibrillation 09/27/2011  . History of vasculitis 09/27/2011  . Rheumatoid arthritis(714.0) 09/27/2011  . Osteoporosis 09/27/2011  . Diabetes mellitus without complication      No family history on file.   History  Substance Use Topics  . Smoking status: Never Smoker   . Smokeless tobacco: Never Used  . Alcohol Use: Not on file     Objective: Filed Vitals:   02/08/13 0947  BP: 182/83  Pulse: 76    General: Alert and Oriented, No Acute Distress HEENT: Pupils equal, round, reactive to light. Conjunctivae clear.   moist mucous membranes pharynx unremarkable. Soft nontender nonpulsatile swelling approximately 3 cm in  diameter in the left medial supraclavicular space. Lungs: Clear to auscultation bilaterally, no wheezing/ronchi/rales.  Comfortable work of breathing. Good air movement. Cardiac:  irregular irregular rhythm . Normal S1/S2.  No murmurs, rubs, nor gallops.   Extremities: No peripheral edema.  Strong peripheral pulses.  Mental Status: No depression, anxiety, nor agitation. Skin: Warm and dry.  Assessment & Plan: Katherine Cervantes was seen today for hypertension.  Diagnoses and associated orders for this visit:  Type 2 diabetes mellitus  Neck mass  Balance problem  Hypertension    Type 2 diabetes: Improved and clinically controlled today. Continue metformin return in one month for A1c applaud her success Essential hypertension: Uncontrolled return later this week for repeat blood pressure check well after taking all a.m. blood pressure medications, next up would be to add lisinopril Neck mass: Persistent and uncontrolled, I provided her with the radiology phone number and encouraged her to call the office directly to schedule time for her ultrasound of the neck the orders still active Balance problem: Stable, continue plan with physical therapy  25 minutes spent face-to-face during visit today of which at least 50% was counseling or coordinating care regarding type 2 diabetes, essential hypertension, neck mass, balance problem.   Return in about 4 weeks (around 03/08/2013) for A1c.

## 2013-03-06 ENCOUNTER — Other Ambulatory Visit: Payer: Self-pay | Admitting: Family Medicine

## 2013-03-10 ENCOUNTER — Ambulatory Visit: Payer: Medicare Other | Admitting: Family Medicine

## 2013-03-19 ENCOUNTER — Ambulatory Visit (INDEPENDENT_AMBULATORY_CARE_PROVIDER_SITE_OTHER): Payer: Medicare Other | Admitting: Family Medicine

## 2013-03-19 ENCOUNTER — Encounter: Payer: Self-pay | Admitting: Family Medicine

## 2013-03-19 VITALS — BP 142/69 | HR 67 | Wt 200.0 lb

## 2013-03-19 DIAGNOSIS — I10 Essential (primary) hypertension: Secondary | ICD-10-CM

## 2013-03-19 DIAGNOSIS — E119 Type 2 diabetes mellitus without complications: Secondary | ICD-10-CM

## 2013-03-19 DIAGNOSIS — R220 Localized swelling, mass and lump, head: Secondary | ICD-10-CM

## 2013-03-19 DIAGNOSIS — Z950 Presence of cardiac pacemaker: Secondary | ICD-10-CM

## 2013-03-19 DIAGNOSIS — I4891 Unspecified atrial fibrillation: Secondary | ICD-10-CM

## 2013-03-19 DIAGNOSIS — R221 Localized swelling, mass and lump, neck: Secondary | ICD-10-CM

## 2013-03-19 DIAGNOSIS — Z8679 Personal history of other diseases of the circulatory system: Secondary | ICD-10-CM

## 2013-03-19 LAB — POCT GLYCOSYLATED HEMOGLOBIN (HGB A1C): Hemoglobin A1C: 6.5

## 2013-03-19 NOTE — Progress Notes (Signed)
CC: Katherine Cervantes is a 80 y.o. female is here for Diabetes   Subjective: HPI:  Followup type 2 diabetes: One month ago she stopped taking metformin because she did not want to take it no known side effects. She's been sticking to a small portion diet and try to stay active with walking on a daily basis. She denies polyuria polyphasia or polydipsia. A1c 3 months ago was 14.9.  Followup hypertension: Continues on metoprolol reports taking this most days of the week but not daily. Denies chest pain, shortness of breath, orthopnea nor peripheral edema  Followup neck mass: Due to a cold she had to cancel an appointment for the soft tissue ultrasound. She states that there's been no change in the swelling in her left neck it is still painless nothing particularly makes better or worse.  Atrial fibrillation with cardiac pacer: Patient reports that a few weeks ago she had an episode where her vision grew dim while she was sitting which was accompanied by fluttering sensation in her chest and in about 1 second later she felt a jolt in her chest and immediate resolution of the above symptoms. She denies any irregular heartbeat, chest pain, nor any motor or sensory disturbances since the episode.  Review Of Systems Outlined In HPI  Past Medical History  Diagnosis Date  . Dyslipidemia 09/27/2011  . Hypertension 09/27/2011  . CAD (coronary artery disease) 09/27/2011  . Atrial fibrillation 09/27/2011  . History of vasculitis 09/27/2011  . Rheumatoid arthritis(714.0) 09/27/2011  . Osteoporosis 09/27/2011  . Diabetes mellitus without complication     Past Surgical History  Procedure Laterality Date  . Appendectomy     No family history on file.  History   Social History  . Marital Status: Widowed    Spouse Name: N/A    Number of Children: N/A  . Years of Education: N/A   Occupational History  . Not on file.   Social History Main Topics  . Smoking status: Never Smoker   . Smokeless tobacco:  Never Used  . Alcohol Use: Not on file  . Drug Use: Not on file  . Sexual Activity: Not on file   Other Topics Concern  . Not on file   Social History Narrative  . No narrative on file     Objective: BP 142/69  Pulse 67  Wt 200 lb (90.719 kg)  General: Alert and Oriented, No Acute Distress HEENT: Pupils equal, round, reactive to light. Conjunctivae clear. Moist mucous membranes pharynx unremarkable Lungs: Clear to auscultation bilaterally, no wheezing/ronchi/rales.  Comfortable work of breathing. Good air movement. Cardiac: Regular rate and rhythm. Normal S1/S2.  No murmurs, rubs, nor gallops.   Extremities: No peripheral edema.  Strong peripheral pulses.  Mental Status: No depression, anxiety, nor agitation. Skin: Warm and dry. Both ankles contain blood-filled vesicles no greater than 5 mm in diameter there about 6 on each ankle they are painless  Assessment & Plan: Renaye was seen today for diabetes.  Diagnoses and associated orders for this visit:  Type 2 diabetes mellitus - POCT HgB A1C - Microalbumin / creatinine urine ratio - Lipid panel - BASIC METABOLIC PANEL WITH GFR  Hypertension  Neck mass  Atrial fibrillation - Ambulatory referral to Cardiology  Cardiac pacemaker in situ - Ambulatory referral to Cardiology  History of vasculitis    Type 2 diabetes: Controlled, since she has obviously made great dietary and exercise changes we will stop metformin for the next 3 months to see if she  actually needs to be restarted on only since she's been off of it for a month already. Hypertension: Uncontrolled encouraged to take metoprolol as prescribed on a daily basis Neck mass: We will attempt to reschedule her for this soft tissue ultrasound Atrial fibrillation with pacemaker: I suspect she has a defibrillator despite her claims that she only has a pacemaker, she is due for follow up with Dr. Leeann Must we will place a referral to help arrange this for her Vasculitis:  Suspect her skin findings on exam is due to her stopping methotrexate during the fall. I've asked her to restart methotrexate she is in agreement   Return in about 3 months (around 06/16/2013).

## 2013-03-26 ENCOUNTER — Other Ambulatory Visit: Payer: Self-pay | Admitting: Family Medicine

## 2013-03-26 DIAGNOSIS — I4891 Unspecified atrial fibrillation: Secondary | ICD-10-CM

## 2013-03-26 DIAGNOSIS — Z950 Presence of cardiac pacemaker: Secondary | ICD-10-CM

## 2013-03-31 ENCOUNTER — Telehealth: Payer: Self-pay | Admitting: *Deleted

## 2013-03-31 NOTE — Telephone Encounter (Signed)
Daughter calls and states that mom is still coughing and has been using Diabetic form of Robitussin no other symptom and states mom mentioned here when seen.  Questions if needs to con tinue the med- has had this since January 28th.  Daughter had at same time and was diagnosed with bronchitis and given antibiotic and injection and inhaler.  Got a bottle of cough med on Monday and now she needs another bottle- med helps for a while but comes right back

## 2013-03-31 NOTE — Telephone Encounter (Signed)
I'd encourage an office visit for evaluation

## 2013-04-01 NOTE — Telephone Encounter (Signed)
Called pt and advised her to schedule an appt. She asked if Dr. Ivan Anchors was available today and I did tell her he was out sick and I didn't know when he would be back. I told her if the only other Dr.here today was booked then the other option would be to be seen in UC. Pt states she does not want to see any other doctor and she would wait until Dr. Ivan Anchors returns. To me pt did sound short of breath on the phone. I did again strongly encourage her to either see what Dr. Karie Schwalbe has available or UC and again pt declined.

## 2013-06-11 ENCOUNTER — Other Ambulatory Visit: Payer: Self-pay | Admitting: Family Medicine

## 2013-07-19 ENCOUNTER — Other Ambulatory Visit: Payer: Self-pay | Admitting: Family Medicine

## 2013-07-21 ENCOUNTER — Other Ambulatory Visit: Payer: Self-pay | Admitting: Family Medicine

## 2013-07-27 ENCOUNTER — Other Ambulatory Visit: Payer: Self-pay | Admitting: Family Medicine

## 2013-07-28 ENCOUNTER — Telehealth: Payer: Self-pay | Admitting: *Deleted

## 2013-07-28 NOTE — Telephone Encounter (Signed)
A nurse from [facility] called and said she had been out to see the patient and she has several non healing lesions on her leg that are pink but not draining. She states the pt wanted to speak to someone about the lesions. I called her back and advised her to schedule an appt to have the lesions evaluated. Pt said she just needed a cream called in for the lesions and to look back in her chart and see that she has vasculitis. I told her that I did see that back in Feb she had been seen for vasculitis but she still needs to be seen for lesions she has today. Pt declined.

## 2013-08-12 ENCOUNTER — Other Ambulatory Visit: Payer: Self-pay | Admitting: *Deleted

## 2013-08-12 DIAGNOSIS — M069 Rheumatoid arthritis, unspecified: Secondary | ICD-10-CM

## 2013-08-12 MED ORDER — METHOTREXATE 2.5 MG PO TABS: ORAL_TABLET | ORAL | Status: DC

## 2013-08-12 NOTE — Telephone Encounter (Signed)
1 month refill sent to pharmacy and note was made to schedule ofc appt for further refills. Corliss Skains, CMA

## 2014-04-12 ENCOUNTER — Ambulatory Visit (INDEPENDENT_AMBULATORY_CARE_PROVIDER_SITE_OTHER): Payer: Medicare Other

## 2014-04-12 ENCOUNTER — Encounter: Payer: Self-pay | Admitting: Family Medicine

## 2014-04-12 ENCOUNTER — Ambulatory Visit (INDEPENDENT_AMBULATORY_CARE_PROVIDER_SITE_OTHER): Payer: Medicare Other | Admitting: Family Medicine

## 2014-04-12 VITALS — BP 149/79 | HR 79 | Wt 233.0 lb

## 2014-04-12 DIAGNOSIS — R222 Localized swelling, mass and lump, trunk: Secondary | ICD-10-CM | POA: Diagnosis not present

## 2014-04-12 DIAGNOSIS — R938 Abnormal findings on diagnostic imaging of other specified body structures: Secondary | ICD-10-CM | POA: Diagnosis not present

## 2014-04-12 DIAGNOSIS — I10 Essential (primary) hypertension: Secondary | ICD-10-CM | POA: Diagnosis not present

## 2014-04-12 DIAGNOSIS — R609 Edema, unspecified: Secondary | ICD-10-CM

## 2014-04-12 DIAGNOSIS — I482 Chronic atrial fibrillation, unspecified: Secondary | ICD-10-CM

## 2014-04-12 DIAGNOSIS — I776 Arteritis, unspecified: Secondary | ICD-10-CM

## 2014-04-12 DIAGNOSIS — L309 Dermatitis, unspecified: Secondary | ICD-10-CM

## 2014-04-12 DIAGNOSIS — E119 Type 2 diabetes mellitus without complications: Secondary | ICD-10-CM

## 2014-04-12 DIAGNOSIS — R221 Localized swelling, mass and lump, neck: Secondary | ICD-10-CM

## 2014-04-12 MED ORDER — METOPROLOL SUCCINATE ER 25 MG PO TB24: 25.0000 mg | ORAL_TABLET | Freq: Every day | ORAL | Status: DC

## 2014-04-12 MED ORDER — TRIAMCINOLONE ACETONIDE 0.1 % EX CREA: TOPICAL_CREAM | Freq: Two times a day (BID) | CUTANEOUS | Status: DC

## 2014-04-12 MED ORDER — FUROSEMIDE 40 MG PO TABS: 40.0000 mg | ORAL_TABLET | Freq: Two times a day (BID) | ORAL | Status: DC

## 2014-04-12 NOTE — Progress Notes (Signed)
CC: Katherine Cervantes is a 81 y.o. female is here for Diabetes   Subjective: HPI:  Follow-up essential hypertension: She continues to take torsemide but has self discontinued metoprolol since I saw her last. She continues to take diltiazem though. No outside blood pressures report. No chest pain shortness of breath orthopnea but she has had worsening peripheral edema over the past few months.  Follow-up type 2 diabetes: A little over 3 months ago she self discontinued metformin because she believed that it was causing her to gain weight. No outside blood sugars to report but she tells me she feels like her blood sugars probably doing pretty well. No formal exercise routine. Admits to not being compliant with trying to reduce carbohydrates. She is currently uncertain how much she is taking it on a daily basis. No polyuria polyphagia or polydipsia.  Follow-up ictal fibrillation: Since I saw her last she tells me that she establish with a cardiologist in [facility]. She was told that she probably didn't need to be on Eliquis.  Further direct questioning she is unable to explain any logic behind her having the option of stopping this blood thinner. She denies any known side effects or intolerance while she was on it.  She has decided not to follow up with cardiology in the [place] system. She's also decided to stop taking digoxin, she denies any known side effects or intolerance to this medication.  Complains of wounds on the back of the calves that have been present for a few months now. She's been putting some over-the-counter moisturizing cream on them but he does not seem to be helping. There painful, tender, but she denies any discharge. She denies any fevers, chills, or swollen lymph nodes. No interventions other than the above. Denies skin changes elsewhere  Complains of worsening edema in both lower extremities localized from the toes proximally up into the knees. She's been taking  furosemide 100 mg once a day but it does not seem to be providing any benefit. Symptoms are present all hours of the day, they do not get any better with elevation. Nothing else seems to make better worse. She is not following any sodium restriction guidelines.  At a prior visit with me a ultrasound of the neck was ordered to evaluate a mass that she has at the left anterior base of the neck. She never got around to doing this. She is uncertain whether not as been getting bigger or smaller but she is confident it is painless.   Review Of Systems Outlined In HPI  Past Medical History  Diagnosis Date  . Dyslipidemia 09/27/2011  . Hypertension 09/27/2011  . CAD (coronary artery disease) 09/27/2011  . Atrial fibrillation 09/27/2011  . History of vasculitis 09/27/2011  . Rheumatoid arthritis(714.0) 09/27/2011  . Osteoporosis 09/27/2011  . Diabetes mellitus without complication     Past Surgical History  Procedure Laterality Date  . Appendectomy     No family history on file.  History   Social History  . Marital Status: Widowed    Spouse Name: N/A  . Number of Children: N/A  . Years of Education: N/A   Occupational History  . Not on file.   Social History Main Topics  . Smoking status: Never Smoker   . Smokeless tobacco: Never Used  . Alcohol Use: Not on file  . Drug Use: Not on file  . Sexual Activity: Not on file   Other Topics Concern  . Not on file  Social History Narrative     Objective: BP 149/79 mmHg  Pulse 79  Wt 233 lb (105.688 kg)  General: Alert and Oriented, No Acute Distress HEENT: Pupils equal, round, reactive to light. Conjunctivae clear.  Moist mucous membranes. No palpable thyromegaly. Soft nontender mass about the size of an ache at the base of the left anterior neck no other palpable adenopathy or abnormality Lungs: Clear to auscultation bilaterally, no wheezing/ronchi/rales.  Comfortable work of breathing. Good air movement. Cardiac: Regular rate and  rhythm. Normal S1/S2.  No murmurs, rubs, nor gallops.   Extremities:   1+ pitting edema from the forefoot proximally back up to the knee.  Mental Status: No depression, anxiety, nor agitation. Skin: Warm and dry. Ulceration approximately 4 cm in diameter tender to the touch on the back of both calves with small islands of hyperkeratosis within these lesions. Assessment & Plan: Rubylee was seen today for diabetes.  Diagnoses and all orders for this visit:  Essential hypertension  Type 2 diabetes mellitus without complication  Chronic atrial fibrillation Orders: -     Ambulatory referral to Cardiology  Vasculitis Orders: -     Ambulatory referral to Rheumatology  Edema Orders: -     furosemide (LASIX) 40 MG tablet; Take 1 tablet (40 mg total) by mouth 2 (two) times daily. To help with swelling.  Neck mass Orders: -     US Soft Tissue Head/Neck; Future  Dermatitis Orders: -     triamcinolone cream (KENALOG) 0.1 %; Apply topically 2 (two) times daily. Apply to ulcers on legs.  Other orders -     metoprolol succinate (TOPROL-XL) 25 MG 24 hr tablet; Take 1 tablet (25 mg total) by mouth daily.    essential hypertension: Uncontrolled, restart metoprolol Type 2 diabetes: A1c of 6.3 today without any metformin use in the last 3 months, this is controlled she does not need to restart her metformin but we discussed the benefits of reducing carbohydrate intake on her diabetes. Atrial fibrillation: I've encouraged her to restart Eliquis since I'm unable to find any contraindication for her to be on this, requesting outside records from her former cardiologist in [place], [place] asked her to visit with Dr. [name] in the near future for further management of atrial fibrillation and her pacemaker. She declines restarting anticoagulation Vasculitis: Uncontrolled chronic condition, continue methotrexate begin using topical triamcinolone cream to the ulcerations on the back of the legs. I strongly  encouraged her to establish with a local rheumatologist and a referral has been placed however she bluntly states that she probably won't go to this visit. Edema: Substituting furosemide for torsemide, do not take both on the same day, let me know furosemide seems to be more effective Neck mass: Ultrasound has been ordered again, I encouraged her to go downstairs to our radiology office before leaving the building today so she doesn't forget to have this procedure done  40 minutes spent face-to-face during visit today of which at least 50% was counseling or coordinating care regarding: 1. Essential hypertension   2. Type 2 diabetes mellitus without complication   3. Chronic atrial fibrillation   4. Vasculitis   5. Edema   6. Neck mass   7. Dermatitis      Return in about 4 weeks (around 05/10/2014) for 30 minute office visit for follow up.

## 2014-04-12 NOTE — Patient Instructions (Addendum)
Please let me know next week if you've not been contacted by the cardiology office for appointment in the near future.  Also please let me know next week if you've not been contacted by a rheumatology office, it is imperative that you see a rheumatologist to manage her vasculitis. For the ulcers in the back of her leg and sent in a prescription of triamcinolone cream to your pharmacy use this for the next 2 weeks.  I've also sent in a prescription for furosemide to replace torsemide to help with the swelling in your legs.   Before you leave the building today would like you to go downstairs for radiology office to talk to them about when they can schedule you for the ultrasound on your neck.

## 2014-04-20 ENCOUNTER — Telehealth: Payer: Self-pay | Admitting: Cardiology

## 2014-04-21 NOTE — Telephone Encounter (Signed)
Closed encounter °

## 2014-04-22 ENCOUNTER — Other Ambulatory Visit: Payer: Self-pay | Admitting: Family Medicine

## 2014-04-23 ENCOUNTER — Telehealth: Payer: Self-pay

## 2014-04-23 NOTE — Telephone Encounter (Signed)
An E-prescription refill request from [facility] pharmacy was received for Tramadol-Acetaminophen 37.5-325 mg for arthritis. Patient was seen on 04/12/2014. Is refill appropriate?

## 2014-04-25 MED ORDER — TRAMADOL-ACETAMINOPHEN 37.5-325 MG PO TABS: ORAL_TABLET | ORAL | Status: DC

## 2014-04-25 NOTE — Telephone Encounter (Signed)
Katherine Cervantes, Rx placed in in-box ready for pickup/faxing.  

## 2014-04-25 NOTE — Telephone Encounter (Signed)
faxed

## 2014-04-28 ENCOUNTER — Telehealth: Payer: Self-pay | Admitting: *Deleted

## 2014-04-28 NOTE — Telephone Encounter (Signed)
Pt wanted to know the status of her referral to rheum.

## 2014-04-29 NOTE — Telephone Encounter (Signed)
I left a msg for them to let me know status.

## 2014-05-02 NOTE — Telephone Encounter (Signed)
Have they updated you yet about an appt? Either way can you let patient know whats going on? Thank you

## 2014-05-04 NOTE — Telephone Encounter (Signed)
Yes.  I have contacted [facility] Rheumatology but they have been unsuccessful in reaching the patient. I have also left msg for patient to return my call but have not heard anything as yet.  Will continue trying.

## 2014-05-10 ENCOUNTER — Ambulatory Visit (INDEPENDENT_AMBULATORY_CARE_PROVIDER_SITE_OTHER): Payer: Medicare Other | Admitting: Family Medicine

## 2014-05-10 ENCOUNTER — Encounter: Payer: Self-pay | Admitting: Family Medicine

## 2014-05-10 VITALS — BP 174/94 | HR 71 | Wt 237.0 lb

## 2014-05-10 DIAGNOSIS — M069 Rheumatoid arthritis, unspecified: Secondary | ICD-10-CM

## 2014-05-10 DIAGNOSIS — I776 Arteritis, unspecified: Secondary | ICD-10-CM

## 2014-05-10 DIAGNOSIS — I10 Essential (primary) hypertension: Secondary | ICD-10-CM | POA: Diagnosis not present

## 2014-05-10 DIAGNOSIS — Z79899 Other long term (current) drug therapy: Secondary | ICD-10-CM

## 2014-05-10 LAB — COMPREHENSIVE METABOLIC PANEL
ALK PHOS: 67 U/L (ref 39–117)
ALT: 15 U/L (ref 0–35)
AST: 14 U/L (ref 0–37)
Albumin: 4.3 g/dL (ref 3.5–5.2)
BUN: 17 mg/dL (ref 6–23)
CO2: 25 meq/L (ref 19–32)
Calcium: 9.6 mg/dL (ref 8.4–10.5)
Chloride: 101 mEq/L (ref 96–112)
Creat: 0.94 mg/dL (ref 0.50–1.10)
GLUCOSE: 104 mg/dL — AB (ref 70–99)
Potassium: 4 mEq/L (ref 3.5–5.3)
SODIUM: 137 meq/L (ref 135–145)
Total Bilirubin: 0.5 mg/dL (ref 0.2–1.2)
Total Protein: 7.8 g/dL (ref 6.0–8.3)

## 2014-05-10 LAB — CBC WITH DIFFERENTIAL/PLATELET
BASOS ABS: 0 10*3/uL (ref 0.0–0.1)
Basophils Relative: 0 % (ref 0–1)
EOS ABS: 0.1 10*3/uL (ref 0.0–0.7)
Eosinophils Relative: 2 % (ref 0–5)
HEMATOCRIT: 43.3 % (ref 36.0–46.0)
HEMOGLOBIN: 14.3 g/dL (ref 12.0–15.0)
Lymphocytes Relative: 26 % (ref 12–46)
Lymphs Abs: 1.9 10*3/uL (ref 0.7–4.0)
MCH: 31 pg (ref 26.0–34.0)
MCHC: 33 g/dL (ref 30.0–36.0)
MCV: 93.7 fL (ref 78.0–100.0)
MPV: 10.4 fL (ref 8.6–12.4)
Monocytes Absolute: 0.7 10*3/uL (ref 0.1–1.0)
Monocytes Relative: 10 % (ref 3–12)
NEUTROS ABS: 4.5 10*3/uL (ref 1.7–7.7)
Neutrophils Relative %: 62 % (ref 43–77)
Platelets: 260 10*3/uL (ref 150–400)
RBC: 4.62 MIL/uL (ref 3.87–5.11)
RDW: 14.3 % (ref 11.5–15.5)
WBC: 7.2 10*3/uL (ref 4.0–10.5)

## 2014-05-10 MED ORDER — METOPROLOL SUCCINATE ER 50 MG PO TB24: 50.0000 mg | ORAL_TABLET | Freq: Every day | ORAL | Status: DC

## 2014-05-10 MED ORDER — METHOTREXATE 2.5 MG PO TABS: ORAL_TABLET | ORAL | Status: DC

## 2014-05-10 MED ORDER — FOLIC ACID 1 MG PO TABS: 1.0000 mg | ORAL_TABLET | Freq: Every day | ORAL | Status: DC

## 2014-05-10 NOTE — Progress Notes (Signed)
CC: Katherine Cervantes is a 81 y.o. female is here for Hypertension   Subjective: HPI:  Follow-up essential hypertension: Currently taking diltiazem, metoprolol 25 mg, and 40 mg of Lasix twice a day. No outside blood pressures reported. No chest pain shortness of breath orthopnea. She states edema is slightly better since I saw her last. Edema is only localized to both lower legs from the shins distally.  Vasculitis: She's been playing phone tag with her rheumatologist and has not been able to establish with a new local rheumatologist.continues to take methotrexate 7.5 mg on a weekly basis with daily folic acid. She had some wounds on her lower legs that were seen last visit, she tells me that these are typical wounds that she gets when her vasculitis has flared up. She was started on triamcinolone cream and she believes it is helping both wounds on the left and right posterior calf. She denies any new skin lesions fevers chills or unintentional weight loss.  Follow-up rheumatoid arthritis: Denies any joint pain since I saw her last.   Review Of Systems Outlined In HPI  Past Medical History  Diagnosis Date  . Dyslipidemia 09/27/2011  . Hypertension 09/27/2011  . CAD (coronary artery disease) 09/27/2011  . Atrial fibrillation 09/27/2011  . History of vasculitis 09/27/2011  . Rheumatoid arthritis(714.0) 09/27/2011  . Osteoporosis 09/27/2011  . Diabetes mellitus without complication     Past Surgical History  Procedure Laterality Date  . Appendectomy     No family history on file.  History   Social History  . Marital Status: Widowed    Spouse Name: N/A  . Number of Children: N/A  . Years of Education: N/A   Occupational History  . Not on file.   Social History Main Topics  . Smoking status: Never Smoker   . Smokeless tobacco: Never Used  . Alcohol Use: Not on file  . Drug Use: Not on file  . Sexual Activity: Not on file   Other Topics Concern  . Not on file   Social History  Narrative     Objective: BP 174/94 mmHg  Pulse 71  Wt 237 lb (107.502 kg)  General: Alert and Oriented, No Acute Distress HEENT: Pupils equal, round, reactive to light. Conjunctivae clear.  Moist mucous membranes pharynxunremarkable Lungs: Clear to auscultation bilaterally, no wheezing/ronchi/rales.  Comfortable work of breathing. Good air movement. Cardiac: Regular rate and rhythm. Normal S1/S2.  No murmurs, rubs, nor gallops.   Extremities: 1+ pitting edema bilaterally in the ankles.  Strong peripheral pulses. Ulcerations on the left calf have shrunk in size and are now crusted over with a scab no signs of infection. Single 1.5 similar diameter shallow ulceration on the back of the inferior right calf without signs of infection Mental Status: No depression, anxiety, nor agitation. Skin: Warm and dry.  Assessment & Plan: Katherine Cervantes was seen today for hypertension.  Diagnoses and all orders for this visit:  Essential hypertension Orders: -     metoprolol succinate (TOPROL-XL) 50 MG 24 hr tablet; Take 1 tablet (50 mg total) by mouth daily.  Vasculitis  Rheumatoid arthritis Orders: -     methotrexate (RHEUMATREX) 2.5 MG tablet; TAKE 3 TABLETS BY MOUTH EVERY WEEK -     CBC w/Diff -     Comp Met (CMET)  High risk medication use Orders: -     CBC w/Diff -     Comp Met (CMET)  Other orders -     folic acid (FOLVITE) 1  MG tablet; Take 1 tablet (1 mg total) by mouth daily.   Essential hypertension: Uncontrolled, increasing metoprolol continue all other medications Vasculitis: Refills provided of methotrexate with folate, she'll need to have CBC and liver function checked today. I have directed her up to the front desk to talk with our referral clerk about getting a direct number and contacting [facility] rheumatology today before she leaves. Vasculitis is uncontrolled I would like her to see rheumatology without any further delays. Rheumatoid arthritis: Controlled, continue methotrexate and  folate.  Return in about 3 months (around 08/10/2014) for DM HTN.

## 2014-05-18 ENCOUNTER — Ambulatory Visit (INDEPENDENT_AMBULATORY_CARE_PROVIDER_SITE_OTHER): Payer: Medicare Other | Admitting: Cardiology

## 2014-05-18 ENCOUNTER — Encounter: Payer: Self-pay | Admitting: Cardiology

## 2014-05-18 VITALS — BP 158/88 | HR 69 | Ht 60.0 in | Wt 212.0 lb

## 2014-05-18 DIAGNOSIS — I10 Essential (primary) hypertension: Secondary | ICD-10-CM | POA: Diagnosis not present

## 2014-05-18 DIAGNOSIS — I482 Chronic atrial fibrillation, unspecified: Secondary | ICD-10-CM

## 2014-05-18 DIAGNOSIS — I2510 Atherosclerotic heart disease of native coronary artery without angina pectoris: Secondary | ICD-10-CM | POA: Diagnosis not present

## 2014-05-18 DIAGNOSIS — I2583 Coronary atherosclerosis due to lipid rich plaque: Secondary | ICD-10-CM

## 2014-05-18 DIAGNOSIS — Z950 Presence of cardiac pacemaker: Secondary | ICD-10-CM | POA: Diagnosis not present

## 2014-05-18 MED ORDER — METOPROLOL SUCCINATE ER 50 MG PO TB24: 50.0000 mg | ORAL_TABLET | Freq: Every day | ORAL | Status: DC

## 2014-05-18 MED ORDER — METOPROLOL SUCCINATE ER 25 MG PO TB24: 25.0000 mg | ORAL_TABLET | Freq: Every day | ORAL | Status: DC

## 2014-05-18 MED ORDER — APIXABAN 5 MG PO TABS: 5.0000 mg | ORAL_TABLET | Freq: Two times a day (BID) | ORAL | Status: DC

## 2014-05-18 NOTE — Patient Instructions (Addendum)
Your physician wants you to follow-up in: 3 MONTHS WITH DR [name] You will receive a reminder letter in the mail two months in advance. If you don't receive a letter, please call our office to schedule the follow-up appointment.   INCREASE METOPROLOL TO 75 MG ONCE DAILY= 1 50 MG TABLET AND 1 25 MG TABLET ONCE DAILY  START ELIQUIS 5 MG ONE TABLET TWICE DAILY  APPOINTMENT WITH ELECTROPHYSIOLOGY TO ESTABLISH FOR PACEMAKER MANAGEMENT  Your physician has requested that you have an echocardiogram. Echocardiography is a painless test that uses sound waves to create images of your heart. It provides your doctor with information about the size and shape of your heart and how well your heart's chambers and valves are working. This procedure takes approximately one hour. There are no restrictions for this procedure.   Your physician recommends that you return for lab work in: 4 WEEKS

## 2014-05-18 NOTE — Assessment & Plan Note (Signed)
Blood pressure elevated.increase Toprol to 75 mg daily.

## 2014-05-18 NOTE — Progress Notes (Signed)
HPI: 81 year old female for evaluation of atrial fibrillation. Nuclear study September 2011 showed ejection fraction 76% and no ischemia or infarction. Patient has had previous care in [place]. She is moving here and presents to establish. She does have some dyspnea on exertion and chronic pedal edema. No orthopnea or PND. She occasionally has chest pain while talking. No syncope or bleeding.  Current Outpatient Prescriptions  Medication Sig Dispense Refill  . folic acid (FOLVITE) 1 MG tablet Take 1 tablet (1 mg total) by mouth daily. 90 tablet 1  . furosemide (LASIX) 40 MG tablet Take 1 tablet (40 mg total) by mouth 2 (two) times daily. To help with swelling. 60 tablet 3  . methotrexate (RHEUMATREX) 2.5 MG tablet TAKE 3 TABLETS BY MOUTH EVERY WEEK 12 tablet 1  . metoprolol succinate (TOPROL-XL) 50 MG 24 hr tablet Take 1 tablet (50 mg total) by mouth daily. 90 tablet 1  . nitroGLYCERIN (NITROSTAT) 0.4 MG SL tablet Place 0.4 mg under the tongue every 5 (five) minutes as needed for chest pain.    . potassium chloride SA (K-DUR,KLOR-CON) 20 MEQ tablet Take 20 mEq by mouth 3 (three) times daily.     . traMADol-acetaminophen (ULTRACET) 37.5-325 MG per tablet TAKE ONE TABLET BY MOUTH EVERY 8 HOURS AS NEEDED FOR ARTHRITIS PAIN 45 tablet 1  . triamcinolone cream (KENALOG) 0.1 % Apply topically 2 (two) times daily. Apply to ulcers on legs. 80 g 1   No current facility-administered medications for this visit.    Allergies  Allergen Reactions  . Aspirin Anaphylaxis  . Codeine Other (See Comments)  . Morphine And Related Other (See Comments)  . Other Other (See Comments)  . Penicillins Other (See Comments)  . Sulfa Antibiotics Other (See Comments)  . Ether   . Levaquin [Levofloxacin In D5w]   . Prednisone   . Procaine   . Toprol Xl  [Metoprolol Tartrate]     Possible     Past Medical History  Diagnosis Date  . Dyslipidemia 09/27/2011  . Hypertension 09/27/2011  . CAD (coronary artery  disease) 09/27/2011  . Atrial fibrillation 09/27/2011  . History of vasculitis 09/27/2011  . Rheumatoid arthritis(714.0) 09/27/2011  . Osteoporosis 09/27/2011  . Diabetes mellitus without complication   . Asthma   . PUD (peptic ulcer disease)   . Pacemaker     Past Surgical History  Procedure Laterality Date  . Appendectomy    . Cholecystectomy    . Abdominal hysterectomy    . Partial gastrectomy    . Ankle surgery    . Carpal tunnel      History   Social History  . Marital Status: Widowed    Spouse Name: N/A  . Number of Children: 7  . Years of Education: N/A   Occupational History  . Not on file.   Social History Main Topics  . Smoking status: Never Smoker   . Smokeless tobacco: Never Used  . Alcohol Use: 0.0 oz/week    0 Standard drinks or equivalent per week     Comment: Occasional  . Drug Use: No  . Sexual Activity: Not on file   Other Topics Concern  . Not on file   Social History Narrative    Family History  Problem Relation Age of Onset  . Hypertension Mother   . Cancer Brother   . Cancer Brother     ROS: no fevers or chills, productive cough, hemoptysis, dysphasia, odynophagia, melena, hematochezia, dysuria, hematuria, rash, seizure activity,  orthopnea, PND, claudication. Remaining systems are negative.  Physical Exam:   Blood pressure 158/88, pulse 69, height 5' (1.524 m), weight 212 lb (96.163 kg).  General:  Well developed/obese in NAD Skin warm/dry Patient not depressed No peripheral clubbing Back-normal HEENT-normal/normal eyelids Neck supple/normal carotid upstroke bilaterally; no bruits; no JVD; no thyromegaly chest - CTA/ normal expansion CV - RRR/normal S1 and S2; no murmurs, rubs or gallops;  PMI nondisplaced Abdomen -NT/ND, no HSM, no mass, + bowel sounds, no bruit 2+ femoral pulses, no bruits Ext-trace ankle edema, no chords, 1+ DP Neuro-grossly nonfocal  ECG ventricular pacing with underlying ectopic atrial tachycardia versus  atrial flutter

## 2014-05-18 NOTE — Assessment & Plan Note (Signed)
Referred to electrophysiology for long-term follow-up.

## 2014-05-18 NOTE — Assessment & Plan Note (Signed)
Patient apparently has a history of atrial fibrillation. She appears to be in ectopic atrial tachycardia versus atrial flutter today. We will obtain all records from [place]. Continue Toprol for rate control. Embolic risk factors of female sex, age greater than 56, diabetes mellitus and hypertension. Add apixaban 5 mg twice a day. Check hemoglobin and renal function in 4 weeks. She also has some dyspnea. Schedule echocardiogram to assess LV function.

## 2014-05-18 NOTE — Assessment & Plan Note (Signed)
Unclear history. She states she has had cardiac catheterizations previously but no stents or procedures. Obtain records from [place].

## 2014-05-25 ENCOUNTER — Telehealth: Payer: Self-pay | Admitting: Cardiology

## 2014-05-25 NOTE — Telephone Encounter (Signed)
Faxed signed release for records to Dr Ernestene Kiel, [facility] [place] per request by Dr [name].  Faxed on 05/25/14. lp

## 2014-05-26 ENCOUNTER — Telehealth: Payer: Self-pay | Admitting: Cardiology

## 2014-05-26 NOTE — Telephone Encounter (Signed)
Received records from Dr Ernestene Kiel [facility]) that Dr [name] requested.  Records given to Foye Spurling Rn for Dr [name] review.  Also records were sent to [facility] on 05/26/14 for appointment with Dr Ladona Ridgel on 05/30/14.

## 2014-05-30 ENCOUNTER — Encounter: Payer: Self-pay | Admitting: Internal Medicine

## 2014-05-30 ENCOUNTER — Ambulatory Visit (INDEPENDENT_AMBULATORY_CARE_PROVIDER_SITE_OTHER): Payer: Medicare Other | Admitting: Internal Medicine

## 2014-05-30 ENCOUNTER — Ambulatory Visit (HOSPITAL_COMMUNITY): Payer: Medicare Other | Attending: Cardiovascular Disease | Admitting: Radiology

## 2014-05-30 VITALS — BP 130/78 | HR 79 | Ht 60.0 in | Wt 238.4 lb

## 2014-05-30 DIAGNOSIS — Z950 Presence of cardiac pacemaker: Secondary | ICD-10-CM | POA: Diagnosis not present

## 2014-05-30 DIAGNOSIS — I2510 Atherosclerotic heart disease of native coronary artery without angina pectoris: Secondary | ICD-10-CM

## 2014-05-30 DIAGNOSIS — I10 Essential (primary) hypertension: Secondary | ICD-10-CM

## 2014-05-30 DIAGNOSIS — I480 Paroxysmal atrial fibrillation: Secondary | ICD-10-CM | POA: Diagnosis not present

## 2014-05-30 DIAGNOSIS — I482 Chronic atrial fibrillation, unspecified: Secondary | ICD-10-CM

## 2014-05-30 DIAGNOSIS — I2583 Coronary atherosclerosis due to lipid rich plaque: Secondary | ICD-10-CM

## 2014-05-30 LAB — MDC_IDC_ENUM_SESS_TYPE_INCLINIC
Brady Statistic RV Percent Paced: 48 %
Date Time Interrogation Session: 20160418170645
Implantable Pulse Generator Model: 5816
Implantable Pulse Generator Serial Number: 1227916
Lead Channel Impedance Value: 238 Ohm
Lead Channel Impedance Value: 319 Ohm
Lead Channel Pacing Threshold Amplitude: 0.5 V
Lead Channel Pacing Threshold Amplitude: 0.875 V
Lead Channel Pacing Threshold Pulse Width: 0.6 ms
Lead Channel Sensing Intrinsic Amplitude: 9.7 mV
Lead Channel Setting Sensing Sensitivity: 2.5 mV
MDC IDC MSMT BATTERY IMPEDANCE: 2300 Ohm
MDC IDC MSMT BATTERY VOLTAGE: 2.75 V
MDC IDC MSMT LEADCHNL RA SENSING INTR AMPL: 1.3 mV
MDC IDC MSMT LEADCHNL RV PACING THRESHOLD PULSEWIDTH: 0.6 ms
MDC IDC SET LEADCHNL RA PACING AMPLITUDE: 2 V
MDC IDC SET LEADCHNL RV PACING PULSEWIDTH: 0.6 ms
MDC IDC STAT BRADY RA PERCENT PACED: 26 %

## 2014-05-30 NOTE — Patient Instructions (Signed)
Medication Instructions:   Your physician recommends that you continue on your current medications as directed. Please refer to the Current Medication list given to you today.    Labwork: None today   Testing/Procedures: None today  Follow-Up: Your physician wants you to follow-up in: 12 months.  You will receive a reminder letter in the mail two months in advance. If you don't receive a letter, please call our office to schedule the follow-up appointment.   ).

## 2014-05-30 NOTE — Progress Notes (Signed)
Echocardiogram complete.  Katherine Cervantes, RCS 

## 2014-05-30 NOTE — Assessment & Plan Note (Signed)
She is in NSR today.

## 2014-05-30 NOTE — Assessment & Plan Note (Signed)
She denies anginal symptoms. She is encouraged to increase her physical activity.

## 2014-05-30 NOTE — Assessment & Plan Note (Signed)
Her blood pressure is minimally elevated. She is encouraged to lose weight and maintain a low sodium diet. She will continue her current meds.

## 2014-05-30 NOTE — Progress Notes (Signed)
HPI Katherine Cervantes is referred today for ongoing evaluation and management of her PPM and atrial fib. She is a pleasant 81 yo woman with a h/o symptomatic bradycardia due to sinus node dysfunction for nearly 10 years. She has had atrial fibrillation which is mostly asymptomatic and appears based on PM interogation to be paroxysmal. She has moved from [place] to [place] to be closer to family. She denies syncope. She admits to non-compliance. She was started on Eliquis by Dr. [name] and has not had the prescription filled. She is anxious about taking a new medication.  Allergies  Allergen Reactions  . Aspirin Anaphylaxis  . Codeine Other (See Comments)  . Morphine And Related Other (See Comments)  . Other Other (See Comments)  . Penicillins Other (See Comments)  . Sulfa Antibiotics Other (See Comments)  . Ether   . Levaquin [Levofloxacin In D5w]   . Prednisone   . Procaine   . Toprol Xl  [Metoprolol Tartrate]     Possible     Current Outpatient Prescriptions  Medication Sig Dispense Refill  . folic acid (FOLVITE) 1 MG tablet Take 1 tablet (1 mg total) by mouth daily. 90 tablet 1  . furosemide (LASIX) 40 MG tablet Take 1 tablet (40 mg total) by mouth 2 (two) times daily. To help with swelling. 60 tablet 3  . methotrexate (RHEUMATREX) 2.5 MG tablet TAKE 3 TABLETS BY MOUTH EVERY WEEK 12 tablet 1  . metoprolol succinate (TOPROL-XL) 50 MG 24 hr tablet Take 1 tablet (50 mg total) by mouth daily. Take with or immediately following a meal. 90 tablet 3  . nitroGLYCERIN (NITROSTAT) 0.4 MG SL tablet Place 0.4 mg under the tongue every 5 (five) minutes as needed for chest pain.    . potassium chloride SA (K-DUR,KLOR-CON) 20 MEQ tablet Take 20 mEq by mouth 3 (three) times daily.     . traMADol-acetaminophen (ULTRACET) 37.5-325 MG per tablet TAKE ONE TABLET BY MOUTH EVERY 8 HOURS AS NEEDED FOR ARTHRITIS PAIN 45 tablet 1  . triamcinolone cream (KENALOG) 0.1 % Apply topically 2 (two) times  daily. Apply to ulcers on legs. 80 g 1  . apixaban (ELIQUIS) 5 MG TABS tablet Take 1 tablet (5 mg total) by mouth 2 (two) times daily. (Patient not taking: Reported on 05/30/2014) 60 tablet 6   No current facility-administered medications for this visit.     Past Medical History  Diagnosis Date  . Dyslipidemia 09/27/2011  . Hypertension 09/27/2011  . CAD (coronary artery disease) 09/27/2011  . Atrial fibrillation 09/27/2011  . History of vasculitis 09/27/2011  . Rheumatoid arthritis(714.0) 09/27/2011  . Osteoporosis 09/27/2011  . Diabetes mellitus without complication   . Asthma   . PUD (peptic ulcer disease)   . Pacemaker     ROS:   All systems reviewed and negative except as noted in the HPI.   Past Surgical History  Procedure Laterality Date  . Appendectomy    . Cholecystectomy    . Abdominal hysterectomy    . Partial gastrectomy    . Ankle surgery    . Carpal tunnel       Family History  Problem Relation Age of Onset  . Hypertension Mother   . Cancer Brother   . Cancer Brother      History   Social History  . Marital Status: Widowed    Spouse Name: N/A  . Number of Children: 7  . Years of Education: N/A   Occupational History  .  Not on file.   Social History Main Topics  . Smoking status: Never Smoker   . Smokeless tobacco: Never Used  . Alcohol Use: 0.0 oz/week    0 Standard drinks or equivalent per week     Comment: Occasional  . Drug Use: No  . Sexual Activity: Not on file   Other Topics Concern  . Not on file   Social History Narrative     BP 130/78 mmHg  Pulse 79  Ht 5' (1.524 m)  Wt 238 lb 6.4 oz (108.138 kg)  BMI 46.56 kg/m2  SpO2 99%  Physical Exam:  Well appearing elderly woman, obese, NAD HEENT: Unremarkable Neck:  No JVD, no thyromegally Back:  No CVA tenderness Lungs:  Clear with no wheezes HEART:  Regular rate rhythm, no murmurs, no rubs, no clicks Abd:  soft, positive bowel sounds, no organomegally, no rebound, no  guarding Ext:  2 plus pulses, no edema, no cyanosis, no clubbing Skin:  No rashes no nodules Neuro:  CN II through XII intact, motor grossly intact   DEVICE  Normal device function.  See PaceArt for details. Underlying rhythm today is sinus  Assess/Plan:

## 2014-06-01 DIAGNOSIS — M0579 Rheumatoid arthritis with rheumatoid factor of multiple sites without organ or systems involvement: Secondary | ICD-10-CM | POA: Diagnosis not present

## 2014-06-01 DIAGNOSIS — Z79899 Other long term (current) drug therapy: Secondary | ICD-10-CM | POA: Diagnosis not present

## 2014-06-02 ENCOUNTER — Telehealth: Payer: Self-pay | Admitting: Cardiology

## 2014-06-02 NOTE — Telephone Encounter (Signed)
Received records from Dr Ernestene Kiel [facility])  requested for patient per Dr [name]   Given to Dr [name] to review.

## 2014-06-16 ENCOUNTER — Other Ambulatory Visit: Payer: Self-pay | Admitting: Family Medicine

## 2014-07-07 DIAGNOSIS — M0579 Rheumatoid arthritis with rheumatoid factor of multiple sites without organ or systems involvement: Secondary | ICD-10-CM | POA: Diagnosis not present

## 2014-07-14 ENCOUNTER — Other Ambulatory Visit: Payer: Self-pay | Admitting: Family Medicine

## 2014-08-10 ENCOUNTER — Ambulatory Visit: Payer: Medicare Other | Admitting: Family Medicine

## 2014-08-10 ENCOUNTER — Telehealth: Payer: Self-pay | Admitting: *Deleted

## 2014-08-10 NOTE — Telephone Encounter (Signed)
Pt left a message on vm that she is having a "kidney issue." I called and talked with patient and she states she has had kindney stones in the past. But then she stated that she had a fever and back pain. She is in [place], [place] and says she can't doesn't want to come in to be seen but wants medication called in. I advised that we cannot call in medications for her without seeing her in the office. Suggested she go to an UC there in [place]. Pt voiced understanding

## 2014-08-12 ENCOUNTER — Emergency Department (INDEPENDENT_AMBULATORY_CARE_PROVIDER_SITE_OTHER)
Admission: EM | Admit: 2014-08-12 | Discharge: 2014-08-12 | Disposition: A | Discharge status: Home / Self Care | Payer: Medicare Other | Source: Home / Self Care | Attending: Family Medicine | Admitting: Family Medicine

## 2014-08-12 ENCOUNTER — Encounter: Payer: Self-pay | Admitting: Emergency Medicine

## 2014-08-12 DIAGNOSIS — R32 Unspecified urinary incontinence: Secondary | ICD-10-CM

## 2014-08-12 DIAGNOSIS — N3090 Cystitis, unspecified without hematuria: Secondary | ICD-10-CM

## 2014-08-12 LAB — POCT URINALYSIS DIP (MANUAL ENTRY)
Bilirubin, UA: NEGATIVE
Glucose, UA: NEGATIVE
Ketones, POC UA: NEGATIVE
Nitrite, UA: NEGATIVE
Protein Ur, POC: NEGATIVE
Spec Grav, UA: 1.015 (ref 1.005–1.03)
UROBILINOGEN UA: 0.2 (ref 0–1)
pH, UA: 5 (ref 5–8)

## 2014-08-12 MED ORDER — FOSFOMYCIN TROMETHAMINE 3 G PO PACK: 3.0000 g | PACK | Freq: Once | ORAL | Status: DC

## 2014-08-12 NOTE — Discharge Instructions (Signed)
Continue increased fluid intake. ° °If symptoms become significantly worse during the night or over the weekend, proceed to the local emergency room.  °

## 2014-08-12 NOTE — ED Notes (Signed)
Urinary incontinence started 4 days ago, had a fever, afebrile today

## 2014-08-12 NOTE — ED Provider Notes (Signed)
CSN: [id number]     Arrival date & time 08/12/14  1724 History   First MD Initiated Contact with Patient 08/12/14 1750     Chief Complaint  Patient presents with  . Urinary Incontinence      HPI Comments: Patient reports that she did not feel well three days ago and developed intermittent urinary incontinence.  She believes that she may have had a low grade fever.  She has developed low back ache and her urine has been malodorous.  She has had lower abdominal pressure sensation.  The history is provided by the patient and a relative.    Past Medical History  Diagnosis Date  . Dyslipidemia 09/27/2011  . Hypertension 09/27/2011  . CAD (coronary artery disease) 09/27/2011  . Atrial fibrillation 09/27/2011  . History of vasculitis 09/27/2011  . Rheumatoid arthritis(714.0) 09/27/2011  . Osteoporosis 09/27/2011  . Diabetes mellitus without complication   . Asthma   . PUD (peptic ulcer disease)   . Pacemaker    Past Surgical History  Procedure Laterality Date  . Appendectomy    . Cholecystectomy    . Abdominal hysterectomy    . Partial gastrectomy    . Ankle surgery    . Carpal tunnel     Family History  Problem Relation Age of Onset  . Hypertension Mother   . Cancer Brother   . Cancer Brother    History  Substance Use Topics  . Smoking status: Never Smoker   . Smokeless tobacco: Never Used  . Alcohol Use: 0.0 oz/week    0 Standard drinks or equivalent per week     Comment: Occasional   OB History    No data available     Review of Systems  Constitutional: Positive for fever, chills, activity change and fatigue.  HENT: Negative.   Eyes: Negative.   Respiratory: Negative.   Cardiovascular: Negative.   Gastrointestinal: Positive for diarrhea. Negative for nausea and abdominal pain.  Genitourinary: Positive for dysuria and urgency. Negative for hematuria.       Urinary incontinence  Musculoskeletal: Positive for back pain.  Skin: Negative.   Neurological: Negative for  headaches.    Allergies  Aspirin; Codeine; Morphine and related; Other; Penicillins; Sulfa antibiotics; Ether; Levaquin; Prednisone; Procaine; and Toprol xl   Home Medications   Prior to Admission medications   Medication Sig Start Date End Date Taking? Authorizing Provider  apixaban (ELIQUIS) 5 MG TABS tablet Take 1 tablet (5 mg total) by mouth 2 (two) times daily. Patient not taking: Reported on 05/30/2014 05/18/14   Lewayne Bunting, MD  folic acid (FOLVITE) 1 MG tablet Take 1 tablet (1 mg total) by mouth daily. 05/10/14   Sean Hommel, DO  fosfomycin (MONUROL) 3 G PACK Take 3 g by mouth once. 08/12/14   Lattie Haw, MD  furosemide (LASIX) 40 MG tablet Take 1 tablet (40 mg total) by mouth 2 (two) times daily. To help with swelling. 04/12/14   Sean Hommel, DO  methotrexate (RHEUMATREX) 2.5 MG tablet TAKE 3 TABLETS BY MOUTH EVERY WEEK 07/15/14   Laren Boom, DO  metoprolol succinate (TOPROL-XL) 50 MG 24 hr tablet Take 1 tablet (50 mg total) by mouth daily. Take with or immediately following a meal. 05/18/14   Lewayne Bunting, MD  nitroGLYCERIN (NITROSTAT) 0.4 MG SL tablet Place 0.4 mg under the tongue every 5 (five) minutes as needed for chest pain.    Historical Provider, MD  potassium chloride SA (K-DUR,KLOR-CON) 20 MEQ tablet Take  20 mEq by mouth 3 (three) times daily.     Historical Provider, MD  traMADol-acetaminophen (ULTRACET) 37.5-325 MG per tablet TAKE ONE TABLET BY MOUTH EVERY 8 HOURS AS NEEDED FOR ARTHRITIS PAIN 06/16/14   Laren Boom, DO  triamcinolone cream (KENALOG) 0.1 % APPLY TOPICALLY 2 TIMES A DAY TO ULCERS ON LEGS. 07/15/14   Sean Hommel, DO   BP 153/73 mmHg  Pulse 70  Temp(Src) 98.1 F (36.7 C) (Oral)  Ht 5' (1.524 m)  Wt 238 lb (107.956 kg)  BMI 46.48 kg/m2  SpO2 96% Physical Exam Nursing notes and Vital Signs reviewed. Appearance:  Patient appears stated age, and in no acute distress.  Patient is obese (BMI 46.5) Eyes:  Pupils are equal, round, and reactive to light and  accomodation.  Extraocular movement is intact.  Conjunctivae are not inflamed  Ears:  Canals normal.  Tympanic membranes normal.  Nose:  Normal turbinates.  No sinus tenderness.  Pharynx:  Normal; moist mucous membranes  Neck:  Supple.  No adenopathy Lungs:  Clear to auscultation.  Breath sounds are equal.  Moving air well. Heart:  Regular rate and rhythm without murmurs, rubs, or gallops.  Abdomen:  Nontender without masses or hepatosplenomegaly.  Bowel sounds are present.  No CVA or flank tenderness.  Extremities:  No edema.  No calf tenderness Skin:  No rash present.   ED Course  Procedures  None    Labs Reviewed  POCT URINALYSIS DIP (MANUAL ENTRY) - Abnormal; Notable for the following:    Clarity, UA cloudy (*)    Blood, UA trace-intact (*)    Leukocytes, UA moderate (2+) (*)    All other components within normal limits  URINE CULTURE      MDM   1. Urinary incontinence, unspecified incontinence type   2. Cystitis    Urine culture pending.  Initially wrote Rx for fosfomycin 3gm powder pack (one dose), but insurance would not cover, and patient could not afford.  Patient very limited as to available choices of antibiotics.  She states that she has taken Keflex in the past without adverse effects.  Will begin Cephalexin 500mg  BID for one week. Continue increased fluid intake. If symptoms become significantly worse during the night or over the weekend, proceed to the local emergency room.  Followup with Family Doctor in 4 days.    , MD 08/16/14 [phone]

## 2014-08-15 ENCOUNTER — Telehealth: Payer: Self-pay | Admitting: *Deleted

## 2014-08-15 LAB — URINE CULTURE

## 2014-08-24 ENCOUNTER — Ambulatory Visit (INDEPENDENT_AMBULATORY_CARE_PROVIDER_SITE_OTHER): Payer: Medicare Other | Admitting: Cardiology

## 2014-08-24 ENCOUNTER — Ambulatory Visit (INDEPENDENT_AMBULATORY_CARE_PROVIDER_SITE_OTHER): Payer: Medicare Other | Admitting: Family Medicine

## 2014-08-24 ENCOUNTER — Encounter: Payer: Self-pay | Admitting: Family Medicine

## 2014-08-24 ENCOUNTER — Encounter: Payer: Self-pay | Admitting: Cardiology

## 2014-08-24 VITALS — BP 156/76 | HR 73 | Wt 238.0 lb

## 2014-08-24 VITALS — BP 126/78 | HR 67 | Ht 60.0 in | Wt 239.0 lb

## 2014-08-24 DIAGNOSIS — I679 Cerebrovascular disease, unspecified: Secondary | ICD-10-CM

## 2014-08-24 DIAGNOSIS — E119 Type 2 diabetes mellitus without complications: Secondary | ICD-10-CM | POA: Diagnosis not present

## 2014-08-24 DIAGNOSIS — I779 Disorder of arteries and arterioles, unspecified: Secondary | ICD-10-CM

## 2014-08-24 DIAGNOSIS — I2510 Atherosclerotic heart disease of native coronary artery without angina pectoris: Secondary | ICD-10-CM

## 2014-08-24 DIAGNOSIS — I482 Chronic atrial fibrillation, unspecified: Secondary | ICD-10-CM

## 2014-08-24 DIAGNOSIS — Z950 Presence of cardiac pacemaker: Secondary | ICD-10-CM

## 2014-08-24 DIAGNOSIS — I10 Essential (primary) hypertension: Secondary | ICD-10-CM

## 2014-08-24 DIAGNOSIS — I776 Arteritis, unspecified: Secondary | ICD-10-CM

## 2014-08-24 DIAGNOSIS — R221 Localized swelling, mass and lump, neck: Secondary | ICD-10-CM | POA: Diagnosis not present

## 2014-08-24 DIAGNOSIS — I2583 Coronary atherosclerosis due to lipid rich plaque: Secondary | ICD-10-CM

## 2014-08-24 DIAGNOSIS — I739 Peripheral vascular disease, unspecified: Secondary | ICD-10-CM

## 2014-08-24 DIAGNOSIS — R609 Edema, unspecified: Secondary | ICD-10-CM | POA: Diagnosis not present

## 2014-08-24 LAB — POCT GLYCOSYLATED HEMOGLOBIN (HGB A1C): HEMOGLOBIN A1C: 6.2

## 2014-08-24 MED ORDER — FUROSEMIDE 40 MG PO TABS: 40.0000 mg | ORAL_TABLET | Freq: Two times a day (BID) | ORAL | Status: DC

## 2014-08-24 MED ORDER — FOLIC ACID 1 MG PO TABS: 1.0000 mg | ORAL_TABLET | Freq: Every day | ORAL | Status: DC

## 2014-08-24 MED ORDER — METOPROLOL SUCCINATE ER 50 MG PO TB24: 50.0000 mg | ORAL_TABLET | Freq: Every day | ORAL | Status: DC

## 2014-08-24 NOTE — Assessment & Plan Note (Signed)
Unclear history. Last nuclear study negative.Will add aspirin or Plavix later if she is unwilling to take anti-coagulants.

## 2014-08-24 NOTE — Assessment & Plan Note (Addendum)
Follow-up carotid Dopplers. We will add aspirin or plavix later if she remains not agreeable to anticoagulation.

## 2014-08-24 NOTE — Progress Notes (Signed)
HPI: FU atrial fibrillation/flutter. Patient has had previous care in [place]. Previous cath but no records available. Previous atrial flutter ablation and pacemaker. Nuclear study September 2011 showed ejection fraction 76% and no ischemia or infarction. Carotid Dopplers July 2014 showed less than 50% bilateral stenosis and follow-up recommended in one year. Echocardiogram April 2016 showed normal LV function, grade 1 diastolic dysfunction and moderate left atrial enlargement. Since last seen, she has some dyspnea on exertion and pedal edema which is chronic. No chest pain, palpitations or syncope.  Current Outpatient Prescriptions  Medication Sig Dispense Refill  . apixaban (ELIQUIS) 5 MG TABS tablet Take 1 tablet (5 mg total) by mouth 2 (two) times daily. 60 tablet 6  . folic acid (FOLVITE) 1 MG tablet Take 1 tablet (1 mg total) by mouth daily. 90 tablet 1  . furosemide (LASIX) 40 MG tablet Take 1 tablet (40 mg total) by mouth 2 (two) times daily. To help with swelling. 60 tablet 3  . methotrexate (RHEUMATREX) 2.5 MG tablet TAKE 3 TABLETS BY MOUTH EVERY WEEK 12 tablet 0  . metoprolol succinate (TOPROL-XL) 50 MG 24 hr tablet Take 1 tablet (50 mg total) by mouth daily. Take with or immediately following a meal. 90 tablet 3  . nitroGLYCERIN (NITROSTAT) 0.4 MG SL tablet Place 0.4 mg under the tongue every 5 (five) minutes as needed for chest pain.    . potassium chloride SA (K-DUR,KLOR-CON) 20 MEQ tablet Take 20 mEq by mouth 3 (three) times daily.     . traMADol-acetaminophen (ULTRACET) 37.5-325 MG per tablet TAKE ONE TABLET BY MOUTH EVERY 8 HOURS AS NEEDED FOR ARTHRITIS PAIN 45 tablet 0  . triamcinolone cream (KENALOG) 0.1 % APPLY TOPICALLY 2 TIMES A DAY TO ULCERS ON LEGS. 80 g 0   No current facility-administered medications for this visit.     Past Medical History  Diagnosis Date  . Dyslipidemia 09/27/2011  . Hypertension 09/27/2011  . CAD (coronary artery disease) 09/27/2011  .  Atrial fibrillation 09/27/2011  . History of vasculitis 09/27/2011  . Rheumatoid arthritis(714.0) 09/27/2011  . Osteoporosis 09/27/2011  . Diabetes mellitus without complication   . Asthma   . PUD (peptic ulcer disease)   . Pacemaker     Past Surgical History  Procedure Laterality Date  . Appendectomy    . Cholecystectomy    . Abdominal hysterectomy    . Partial gastrectomy    . Ankle surgery    . Carpal tunnel      History   Social History  . Marital Status: Widowed    Spouse Name: N/A  . Number of Children: 7  . Years of Education: N/A   Occupational History  . Not on file.   Social History Main Topics  . Smoking status: Never Smoker   . Smokeless tobacco: Never Used  . Alcohol Use: 0.0 oz/week    0 Standard drinks or equivalent per week     Comment: Occasional  . Drug Use: No  . Sexual Activity: Not on file   Other Topics Concern  . Not on file   Social History Narrative    ROS: no fevers or chills, productive cough, hemoptysis, dysphasia, odynophagia, melena, hematochezia, dysuria, hematuria, rash, seizure activity, orthopnea, PND, claudication. Remaining systems are negative.  Physical Exam: Well-developed obese in no acute distress.  Skin is warm and dry.  HEENT is normal.  Neck is supple.  Chest is clear to auscultation with normal expansion.  Cardiovascular exam is regular  rate and rhythm.  Abdominal exam nontender or distended. No masses palpated. Extremities show 1+ edema. neuro grossly intact

## 2014-08-24 NOTE — Assessment & Plan Note (Signed)
Blood pressure controlled. Continue present medications. 

## 2014-08-24 NOTE — Assessment & Plan Note (Signed)
History of atrial fibrillation and flutter. Continue beta blocker. She has risk factors of age greater than 43, female sex and hypertension. I have strongly recommended anticoagulation. She is not taking apixaban and she feels it may be causing side effects. We discussed Coumadin or xarelto. She will consider these and contact us if she is agreeable to take.

## 2014-08-24 NOTE — Progress Notes (Signed)
CC: Katherine Cervantes is a 81 y.o. female is here for Diabetes   Subjective: HPI:  Follow-up Dr. 2 diabetes: Since I saw her last she reports success with continuing to minimize her carbohydrate intake. She is no longer eating dessert every day. She is also spacing out her meals throughout the day and eating more protein relative to carbs. She denies polyuria by vaginal polydipsia. No vision loss. No outside blood sugars to report  Follow-up neck mass: The left-sided neck mass doesn't know longer bother her now that she knows it's only a lipoma. She denies any enlargement or pain. Denies any motor or sensory disturbances in the left upper extremity nor limb claudication.  Follow vasculitis: Since I saw her last she saw a rheumatologist who increased her dose of methotrexate. Since this time she continues to use tramadol and cream on the ulcerations on her right lower leg. She believes it's improving over the past 2 or 3 months. She denies any pain whatsoever. She would like to know if she can start a swimming regimen with these wounds.  Denies fevers, chills, chest pain, nor orthopnea   Review Of Systems Outlined In HPI  Past Medical History  Diagnosis Date  . Dyslipidemia 09/27/2011  . Hypertension 09/27/2011  . CAD (coronary artery disease) 09/27/2011  . Atrial fibrillation 09/27/2011  . History of vasculitis 09/27/2011  . Rheumatoid arthritis(714.0) 09/27/2011  . Osteoporosis 09/27/2011  . Diabetes mellitus without complication   . Asthma   . PUD (peptic ulcer disease)   . Pacemaker     Past Surgical History  Procedure Laterality Date  . Appendectomy    . Cholecystectomy    . Abdominal hysterectomy    . Partial gastrectomy    . Ankle surgery    . Carpal tunnel     Family History  Problem Relation Age of Onset  . Hypertension Mother   . Cancer Brother   . Cancer Brother     History   Social History  . Marital Status: Widowed    Spouse Name: N/A  . Number of Children: 7   . Years of Education: N/A   Occupational History  . Not on file.   Social History Main Topics  . Smoking status: Never Smoker   . Smokeless tobacco: Never Used  . Alcohol Use: 0.0 oz/week    0 Standard drinks or equivalent per week     Comment: Occasional  . Drug Use: No  . Sexual Activity: Not on file   Other Topics Concern  . Not on file   Social History Narrative     Objective: BP 156/76 mmHg  Pulse 73  Wt 238 lb (107.956 kg)  General: Alert and Oriented, No Acute Distress HEENT: Pupils equal, round, reactive to light. Conjunctivae clear.   Moist mucous membranes, pharynx without inflammation nor lesions.  Neck supple without palpable lymphadenopathy nor abnormal masses other than a soft non-mobile approximately 3 cm fleshy mass at the base of   the left lateral neck Lungs: Clear to auscultation bilaterally, no wheezing/ronchi/rales.  Comfortable work of breathing. Good air movement. Cardiac: Regular rate and rhythm. Normal S1/S2.  No murmurs, rubs, nor gallops.   Abdomen: Normal bowel sounds, soft and non tender without palpable masses. Extremities: 1+ peripheral edema in the ankles distally.  Strong peripheral pulses.  Mental Status: No depression, anxiety, nor agitation. Skin: Warm and dry. 2 shallow ulcerations on the lateral distal leg just proximal to the lateral malleoli, one is 0.5 cm diameter  the other approximately 1 cm diameter without signs of infection  Assessment & Plan: Katherine Cervantes was seen today for diabetes.  Diagnoses and all orders for this visit:  Type 2 diabetes mellitus without complication Orders: -     POCT HgB A1C  Neck mass  Vasculitis    type 2 diabetes: A1c 6.2, controlled, congratulated her success with diet. No change to lifestyle interventions other than getting exercise regimen Neck mass: Stable lipoma no further interventions Vasculitis: Improving her lesions are about half the size they were last time she was here, I think it safe to  go swimming if she covers these with Tegaderm, I gave her and her daughter handful of these and showed them how to cover the wounds while she is in the pool water to keep them waterproof.   25 minutes spent face-to-face during visit today of which at least 50% was counseling or coordinating care regarding: 1. Type 2 diabetes mellitus without complication   2. Neck mass   3. Vasculitis      Return in about 3 months (around 11/24/2014).

## 2014-08-24 NOTE — Patient Instructions (Signed)
Medication Instructions:  Patient is going to think about going on  the Eliquis and will let our office   Labwork: -none  Testing/Procedures: Your physician has requested that you have a carotid duplex. This test is an ultrasound of the carotid arteries in your neck. It looks at blood flow through these arteries that supply the brain with blood. Allow one hour for this exam. There are no restrictions or special instructions.    Follow-Up: Your physician wants you to follow-up in: 6 month with Dr. [name]. You will receive a reminder letter in the mail two months in advance. If you don't receive a letter, please call our office to schedule the follow-up appointment.   Any Other Special Instructions Will Be Listed Below (If Applicable).

## 2014-08-24 NOTE — Assessment & Plan Note (Signed)
Managed by electrophysiology. 

## 2014-08-30 ENCOUNTER — Encounter (HOSPITAL_COMMUNITY): Payer: Medicare Other

## 2014-09-01 ENCOUNTER — Encounter (HOSPITAL_COMMUNITY): Payer: Medicare Other

## 2014-09-01 ENCOUNTER — Ambulatory Visit (HOSPITAL_COMMUNITY): Payer: Medicare Other

## 2014-09-06 ENCOUNTER — Encounter (HOSPITAL_COMMUNITY): Payer: Medicare Other

## 2014-09-26 ENCOUNTER — Encounter (HOSPITAL_COMMUNITY): Payer: Medicare Other

## 2014-10-26 ENCOUNTER — Encounter (HOSPITAL_COMMUNITY): Payer: Medicare Other

## 2014-11-25 ENCOUNTER — Ambulatory Visit: Payer: Medicare Other | Admitting: Family Medicine

## 2014-11-30 ENCOUNTER — Ambulatory Visit (INDEPENDENT_AMBULATORY_CARE_PROVIDER_SITE_OTHER): Payer: Medicare Other | Admitting: *Deleted

## 2014-11-30 DIAGNOSIS — I480 Paroxysmal atrial fibrillation: Secondary | ICD-10-CM

## 2014-11-30 DIAGNOSIS — Z950 Presence of cardiac pacemaker: Secondary | ICD-10-CM | POA: Diagnosis not present

## 2014-11-30 LAB — CUP PACEART INCLINIC DEVICE CHECK
Battery Voltage: 2.74 V
Brady Statistic RA Percent Paced: 20 %
Brady Statistic RV Percent Paced: 80 %
Date Time Interrogation Session: 20161019184333
Implantable Lead Location: 753860
Lead Channel Impedance Value: 234 Ohm
Lead Channel Impedance Value: 308 Ohm
Lead Channel Pacing Threshold Amplitude: 1 V
Lead Channel Pacing Threshold Amplitude: 1 V
Lead Channel Pacing Threshold Pulse Width: 0.6 ms
Lead Channel Setting Pacing Pulse Width: 0.6 ms
MDC IDC LEAD IMPLANT DT: 20090521
MDC IDC LEAD IMPLANT DT: 20090521
MDC IDC LEAD LOCATION: 753859
MDC IDC MSMT BATTERY IMPEDANCE: 2700 Ohm
MDC IDC MSMT LEADCHNL RA SENSING INTR AMPL: 0.7 mV
MDC IDC MSMT LEADCHNL RV PACING THRESHOLD PULSEWIDTH: 0.6 ms
MDC IDC MSMT LEADCHNL RV SENSING INTR AMPL: 10.6 mV
MDC IDC SET LEADCHNL RA PACING AMPLITUDE: 2 V
MDC IDC SET LEADCHNL RV SENSING SENSITIVITY: 2.5 mV
Pulse Gen Model: 5816
Pulse Gen Serial Number: 1227916

## 2014-11-30 NOTE — Progress Notes (Signed)
Pacemaker check in clinic. Normal device function. Thresholds, sensing, impedances consistent with previous measurements. Device programmed to maximize longevity. 59 AMS episodes (2.5% mode switch burden), Eliquis ordered but patient not taking due to cost, longest episode 1 day 15 hours, peak A 307bpm. Device programmed at appropriate safety margins. Histogram distribution appropriate for patient activity level. Device programmed to optimize intrinsic conduction. Estimated longevity 1.25-2.5 years. Patient education completed. ROV with GT in 05/2015.   Patient states she will go to pick up Eliquis today after I contacted her pharmacy and confirmed that she should only have to pay $45 this month for the prescription.  We have no samples available and she cannot use the free trial offer card due to having Medicare prescription coverage.  She states that she has been telling her family and her healthcare providers that she is taking the Eliquis but that she has never actually filled the prescription due to cost.  I advised patient about importance of taking Eliquis to reduce risk of CVA.  She states that she is also concerned about her blood pressures and increasing SOB and lower extremity edema, but that she and Dr. [name] had a "slight disagreement" at her last office visit and that she does not want to contact him about her symptoms.  She states that she does not have a scale or a way to check her BP at home.  Spoke with Gypsy Balsam, NP for recommendations, advised appointment with Flex provider to follow-up on SOB and edema.  ROV scheduled with Norma Fredrickson, NP on 12/05/14 at 10:30am.  Patient aware to call with worsening symptoms, questions, or concerns.

## 2014-12-05 ENCOUNTER — Ambulatory Visit: Payer: Medicare Other | Admitting: Nurse Practitioner

## 2014-12-14 ENCOUNTER — Encounter: Payer: Self-pay | Admitting: Internal Medicine

## 2014-12-14 ENCOUNTER — Encounter: Payer: Self-pay | Admitting: Family Medicine

## 2014-12-14 ENCOUNTER — Ambulatory Visit (INDEPENDENT_AMBULATORY_CARE_PROVIDER_SITE_OTHER): Payer: Medicare Other | Admitting: Family Medicine

## 2014-12-14 VITALS — BP 176/93 | HR 72 | Wt 238.0 lb

## 2014-12-14 DIAGNOSIS — I480 Paroxysmal atrial fibrillation: Secondary | ICD-10-CM

## 2014-12-14 DIAGNOSIS — I10 Essential (primary) hypertension: Secondary | ICD-10-CM | POA: Diagnosis not present

## 2014-12-14 DIAGNOSIS — E119 Type 2 diabetes mellitus without complications: Secondary | ICD-10-CM

## 2014-12-14 MED ORDER — FUROSEMIDE 40 MG PO TABS: 40.0000 mg | ORAL_TABLET | Freq: Two times a day (BID) | ORAL | Status: DC

## 2014-12-14 MED ORDER — METOLAZONE 5 MG PO TABS: ORAL_TABLET | ORAL | Status: DC

## 2014-12-14 NOTE — Progress Notes (Signed)
CC: Katherine Cervantes is a 81 y.o. female is here for Hyperglycemia; Leg Swelling; Medication Questions; and Shortness of Breath   Subjective: HPI:  Complaints of bilateral leg edema from the feet proximally to the shins. It's worse the longer she is on her feet. It improves only slightly after taking furosemide. She's tried her best to cut out salt in her diet. She notices that she spends his shortness of breath that is directly proportional to the degree of swelling that she has had any given moment. She denies edema elsewhere. She denies rapid heartbeat or sensation of irregular heartbeat. She tells me she is 100% compliant with eliquis even though she is blaming her edema on this medication. Denies chest pain.  Follow-up essential hypertension: She's been prescribed metoprolol however she admits that she is not entirely compliant with taking his medication. She denies any known side effects or intolerances. No outside blood pressures report from home.  Follow-up type 2 diabetes: No outside blood sugars report. She denies poorly healing wounds or vision loss. She tells me that her diet has not changed since I saw her last and her A1c was controlled. She is currently not taking any antihyperglycemic medications   Review Of Systems Outlined In HPI  Past Medical History  Diagnosis Date  . Dyslipidemia 09/27/2011  . Hypertension 09/27/2011  . CAD (coronary artery disease) 09/27/2011  . Atrial fibrillation (HCC) 09/27/2011  . History of vasculitis 09/27/2011  . Rheumatoid arthritis(714.0) 09/27/2011  . Osteoporosis 09/27/2011  . Diabetes mellitus without complication (HCC)   . Asthma   . PUD (peptic ulcer disease)   . Pacemaker     Past Surgical History  Procedure Laterality Date  . Appendectomy    . Cholecystectomy    . Abdominal hysterectomy    . Partial gastrectomy    . Ankle surgery    . Carpal tunnel     Family History  Problem Relation Age of Onset  . Hypertension Mother   .  Cancer Brother   . Cancer Brother     Social History   Social History  . Marital Status: Widowed    Spouse Name: N/A  . Number of Children: 7  . Years of Education: N/A   Occupational History  . Not on file.   Social History Main Topics  . Smoking status: Never Smoker   . Smokeless tobacco: Never Used  . Alcohol Use: 0.0 oz/week    0 Standard drinks or equivalent per week     Comment: Occasional  . Drug Use: No  . Sexual Activity: Not on file   Other Topics Concern  . Not on file   Social History Narrative     Objective: BP 176/93 mmHg  Pulse 72  Wt 238 lb (107.956 kg)  General: Alert and Oriented, No Acute Distress HEENT: Pupils equal, round, reactive to light. Conjunctivae clear.  Moist mucous membranes Lungs: Clear to auscultation bilaterally, no wheezing/ronchi/rales.  Comfortable work of breathing. Good air movement. Cardiac: distant heart sounds,Regular rate and rhythm. Normal S1/S2.  No murmurs, rubs, nor gallops.   Abdomen: Normal bowel sounds, soft and non tender without palpable masses. Extremities: 1+ pitting edema bilaterally involving the ankles proximally to the middle of the shins, symmetric.  Strong peripheral pulses.  Mental Status: No depression, anxiety, nor agitation. Skin: Warm and dry.  Assessment & Plan: Katherine Cervantes was seen today for hyperglycemia, leg swelling, medication questions and shortness of breath.  Diagnoses and all orders for this visit:  Paroxysmal  atrial fibrillation (HCC) -     furosemide (LASIX) 40 MG tablet; Take 1-1.5 tablets (40-60 mg total) by mouth 2 (two) times daily. To help with swelling. -     metolazone (ZAROXOLYN) 5 MG tablet; Take once every five days first thing in the morning with furosemide, ONLY IF LEG SWELLING IS UNCONTROLLED.  Essential hypertension -     Basic Metabolic Panel (BMET)  Type 2 diabetes mellitus without complication, without long-term current use of insulin (HCC) -     Hemoglobin A1c -      Microalbumin / creatinine urine ratio   Paroxysmal atrial fibrillation: rate is within normal limits today, properly anticoagulated, counseled that is very unlikely Eliquis is causing her edema however we can attack or edema by slightly increasing furosemide and with judicious use of Zaroxolyn. I made sure that she completely understood the latter of these 2 should only be used infrequently and when absolutely necessary, such as when shortness of breath is noticeable. Essential hypertension:uncontrolled, Urged to take metoprolol on a daily basis.return as well as possible blood pressures above 140/90 Type 2 diabetes: Clinically controlledhowever  due for A1c  Return in about 3 months (around 03/16/2015).

## 2014-12-15 LAB — HEMOGLOBIN A1C
HEMOGLOBIN A1C: 6.4 % — AB (ref <5.7)
Mean Plasma Glucose: 137 mg/dL — ABNORMAL HIGH (ref <117)

## 2014-12-15 LAB — BASIC METABOLIC PANEL
BUN: 16 mg/dL (ref 7–25)
CHLORIDE: 101 mmol/L (ref 98–110)
CO2: 26 mmol/L (ref 20–31)
Calcium: 9.6 mg/dL (ref 8.6–10.4)
Creat: 0.88 mg/dL (ref 0.60–0.88)
GLUCOSE: 112 mg/dL — AB (ref 65–99)
POTASSIUM: 3.6 mmol/L (ref 3.5–5.3)
Sodium: 138 mmol/L (ref 135–146)

## 2014-12-15 NOTE — Progress Notes (Signed)
Pt advised.

## 2014-12-19 ENCOUNTER — Other Ambulatory Visit: Payer: Self-pay | Admitting: Family Medicine

## 2014-12-21 ENCOUNTER — Ambulatory Visit: Payer: Medicare Other | Admitting: Family Medicine

## 2014-12-21 ENCOUNTER — Other Ambulatory Visit: Payer: Self-pay | Admitting: Family Medicine

## 2014-12-26 ENCOUNTER — Telehealth: Payer: Self-pay | Admitting: Emergency Medicine

## 2015-02-15 ENCOUNTER — Other Ambulatory Visit: Payer: Self-pay | Admitting: Family Medicine

## 2015-02-24 ENCOUNTER — Telehealth: Payer: Self-pay | Admitting: Emergency Medicine

## 2015-02-24 NOTE — Telephone Encounter (Signed)
Yes

## 2015-03-14 ENCOUNTER — Telehealth: Payer: Self-pay

## 2015-03-14 DIAGNOSIS — E119 Type 2 diabetes mellitus without complications: Secondary | ICD-10-CM

## 2015-03-14 DIAGNOSIS — E785 Hyperlipidemia, unspecified: Secondary | ICD-10-CM

## 2015-03-14 NOTE — Telephone Encounter (Signed)
Just an A1c and Urine Microalbumin which can be obtained with our point of care machines.  No need to get labs ahead of time.

## 2015-03-14 NOTE — Telephone Encounter (Signed)
Daughter would like to know will mom be getting and or need lab work for her Friday visit?

## 2015-03-15 NOTE — Telephone Encounter (Signed)
Daughter notified 

## 2015-03-17 ENCOUNTER — Emergency Department
Admission: EM | Admit: 2015-03-17 | Discharge: 2015-03-17 | Disposition: A | Discharge status: Home / Self Care | Payer: Medicare Other | Source: Home / Self Care | Attending: Family Medicine | Admitting: Family Medicine

## 2015-03-17 ENCOUNTER — Emergency Department (INDEPENDENT_AMBULATORY_CARE_PROVIDER_SITE_OTHER): Payer: Medicare Other

## 2015-03-17 ENCOUNTER — Ambulatory Visit: Payer: Medicare Other | Admitting: Family Medicine

## 2015-03-17 ENCOUNTER — Encounter: Payer: Self-pay | Admitting: *Deleted

## 2015-03-17 DIAGNOSIS — M25531 Pain in right wrist: Secondary | ICD-10-CM | POA: Diagnosis not present

## 2015-03-17 DIAGNOSIS — M8588 Other specified disorders of bone density and structure, other site: Secondary | ICD-10-CM | POA: Diagnosis not present

## 2015-03-17 DIAGNOSIS — M7989 Other specified soft tissue disorders: Secondary | ICD-10-CM | POA: Diagnosis not present

## 2015-03-17 DIAGNOSIS — M25431 Effusion, right wrist: Secondary | ICD-10-CM

## 2015-03-17 DIAGNOSIS — M85841 Other specified disorders of bone density and structure, right hand: Secondary | ICD-10-CM

## 2015-03-17 DIAGNOSIS — M79641 Pain in right hand: Secondary | ICD-10-CM | POA: Diagnosis not present

## 2015-03-17 NOTE — Discharge Instructions (Signed)
May continue Ultram, one or two tabs at bedtime as needed.  Elevate right arm/hand at night. Followup with rheumatologist as soon as possible.

## 2015-03-17 NOTE — ED Provider Notes (Signed)
CSN: [id number]     Arrival date & time 03/17/15  1523 History   First MD Initiated Contact with Patient 03/17/15 1557     Chief Complaint  Patient presents with  . Edema      HPI Comments: Patient recalls experiencing a sudden pain in her right wrist about 6 weeks ago, followed by swelling.  The pain and swelling have persisted, extending to her right elbow.  She recalls no injury.  She feels well otherwise.  Patient is a 82 y.o. female presenting with hand pain. The history is provided by the patient and a relative.  Hand Pain This is a chronic problem. Episode onset: 6 weeks ago. The problem occurs constantly. The problem has been gradually worsening. Exacerbated by: movement of hand. Nothing relieves the symptoms. She has tried a warm compress for the symptoms. The treatment provided mild relief.    Past Medical History  Diagnosis Date  . Dyslipidemia 09/27/2011  . Hypertension 09/27/2011  . CAD (coronary artery disease) 09/27/2011  . Atrial fibrillation (HCC) 09/27/2011  . History of vasculitis 09/27/2011  . Rheumatoid arthritis(714.0) 09/27/2011  . Osteoporosis 09/27/2011  . Diabetes mellitus without complication (HCC)   . Asthma   . PUD (peptic ulcer disease)   . Pacemaker    Past Surgical History  Procedure Laterality Date  . Appendectomy    . Cholecystectomy    . Abdominal hysterectomy    . Partial gastrectomy    . Ankle surgery    . Carpal tunnel     Family History  Problem Relation Age of Onset  . Hypertension Mother   . Cancer Brother   . Cancer Brother    Social History  Substance Use Topics  . Smoking status: Never Smoker   . Smokeless tobacco: Never Used  . Alcohol Use: 0.0 oz/week    0 Standard drinks or equivalent per week     Comment: Occasional   OB History    No data available     Review of Systems  Constitutional: Negative for fever and chills.  All other systems reviewed and are negative.   Allergies  Aspirin; Codeine; Morphine and related;  Other; Penicillins; Sulfa antibiotics; Ether; Levaquin; Metoprolol; Prednisone; Procaine; and Toprol xl   Home Medications   Prior to Admission medications   Medication Sig Start Date End Date Taking? Authorizing Provider  apixaban (ELIQUIS) 5 MG TABS tablet Take 1 tablet (5 mg total) by mouth 2 (two) times daily. 05/18/14   Lewayne Bunting, MD  folic acid (FOLVITE) 1 MG tablet Take 1 tablet (1 mg total) by mouth daily. 08/24/14   Lewayne Bunting, MD  furosemide (LASIX) 40 MG tablet Take 1-1.5 tablets (40-60 mg total) by mouth 2 (two) times daily. To help with swelling. 12/14/14   Sean Hommel, DO  methotrexate (RHEUMATREX) 2.5 MG tablet TAKE 3 TABLETS BY MOUTH EVERY WEEK 07/15/14   Laren Boom, DO  metolazone (ZAROXOLYN) 5 MG tablet Take once every five days first thing in the morning with furosemide, ONLY IF LEG SWELLING IS UNCONTROLLED. 12/14/14   Laren Boom, DO  metoprolol succinate (TOPROL-XL) 50 MG 24 hr tablet Take 1 tablet (50 mg total) by mouth daily. Take with or immediately following a meal. 08/24/14   Lewayne Bunting, MD  nitroGLYCERIN (NITROSTAT) 0.4 MG SL tablet Place 0.4 mg under the tongue every 5 (five) minutes as needed for chest pain.    Historical Provider, MD  potassium chloride SA (K-DUR,KLOR-CON) 20 MEQ tablet Take 20  mEq by mouth 3 (three) times daily.     Historical Provider, MD  traMADol-acetaminophen (ULTRACET) 37.5-325 MG tablet TAKE ONE TABLET BY MOUTH EVERY 8 HOURS AS NEEDED FOR ARTHRITIS PAIN 02/16/15   Laren Boom, DO  triamcinolone cream (KENALOG) 0.1 % APPLY TOPICALLY 2 TIMES A DAY TO ULCERS ON LEGS. 07/15/14   Laren Boom, DO   Meds Ordered and Administered this Visit  Medications - No data to display  BP 166/89 mmHg  Pulse 75  Temp(Src) 97.7 F (36.5 C) (Oral)  Resp 16  Wt 237 lb (107.502 kg)  SpO2 100% No data found.   Physical Exam  Constitutional: She is oriented to person, place, and time. She appears well-developed and well-nourished. No distress.    HENT:  Head: Normocephalic.  Eyes: Pupils are equal, round, and reactive to light.  Neck: Normal range of motion.  Cardiovascular: Normal heart sounds.   Pulmonary/Chest: Breath sounds normal.  Musculoskeletal:       Right wrist: She exhibits decreased range of motion, tenderness, bony tenderness and swelling.       Arms:      Hands: Dorsum of right hand/wrist swollen.  Tenderness over MCP joints and wrist.  Right elbow is tender to palpation. Radial pulse intact.   Neurological: She is alert and oriented to person, place, and time.  Skin: Skin is warm and dry.  Nursing note and vitals reviewed.   ED Course  Procedures  None   Imaging Review Dg Wrist Complete Right  03/17/2015  CLINICAL DATA:  Right wrist pain with swelling for 6 weeks. No known injury. History of rheumatoid arthritis. EXAM: RIGHT WRIST - COMPLETE 3+ VIEW COMPARISON:  None. FINDINGS: The bones are diffusely demineralized. There is no evidence of acute fracture, dislocation or erosive change. Osteoarthritic changes are present at the first carpometacarpal joint. The soft tissues are diffusely prominent without apparent focal swelling at the wrist. IMPRESSION: Osteopenia with nonspecific generalized soft tissue prominence. No acute osseous findings or signs of inflammatory arthropathy. Electronically Signed   By: Carey Bullocks M.D.   On: 03/17/2015 16:46   Dg Hand Complete Right  03/17/2015  CLINICAL DATA:  Right hand pain and swelling for 6 weeks. No known injury. History of rheumatoid arthritis. EXAM: RIGHT HAND - COMPLETE 3+ VIEW COMPARISON:  None. FINDINGS: The bones are diffusely demineralized. There is no evidence of acute fracture, dislocation or erosive change. There is mild metacarpal joint space loss, especially at the first and third MCP joints. The soft tissues are diffusely prominent, especially over the dorsum of the hand. No foreign body identified. IMPRESSION: Osteopenia with nonspecific generalized soft  tissue prominence. No acute osseous findings or signs of inflammatory arthropathy. Electronically Signed   By: Carey Bullocks M.D.   On: 03/17/2015 16:47      MDM   1. Swelling of right hand   2. Pain and swelling of right wrist    Suspect manifestation of RA May continue Ultram, one or two tabs at bedtime as needed.  Elevate right arm/hand at night. Followup with rheumatologist as soon as possible.    Lattie Haw, MD 03/17/15 [id number]

## 2015-03-17 NOTE — ED Notes (Signed)
Patient reports a 1 1/2 month h/o right hand edema without injury. Edema is pitting and radiates to elbow. This happened previously with both hands. H/o RA and vasculitis.

## 2015-03-24 ENCOUNTER — Encounter: Payer: Self-pay | Admitting: Family Medicine

## 2015-03-24 ENCOUNTER — Ambulatory Visit (INDEPENDENT_AMBULATORY_CARE_PROVIDER_SITE_OTHER): Payer: Medicare Other | Admitting: Family Medicine

## 2015-03-24 VITALS — BP 153/72 | HR 73 | Wt 241.0 lb

## 2015-03-24 DIAGNOSIS — I2583 Coronary atherosclerosis due to lipid rich plaque: Secondary | ICD-10-CM

## 2015-03-24 DIAGNOSIS — Z950 Presence of cardiac pacemaker: Secondary | ICD-10-CM

## 2015-03-24 DIAGNOSIS — E119 Type 2 diabetes mellitus without complications: Secondary | ICD-10-CM

## 2015-03-24 DIAGNOSIS — I10 Essential (primary) hypertension: Secondary | ICD-10-CM

## 2015-03-24 DIAGNOSIS — I482 Chronic atrial fibrillation, unspecified: Secondary | ICD-10-CM

## 2015-03-24 DIAGNOSIS — I480 Paroxysmal atrial fibrillation: Secondary | ICD-10-CM

## 2015-03-24 DIAGNOSIS — I2510 Atherosclerotic heart disease of native coronary artery without angina pectoris: Secondary | ICD-10-CM

## 2015-03-24 LAB — POCT GLYCOSYLATED HEMOGLOBIN (HGB A1C): Hemoglobin A1C: 6.5

## 2015-03-24 MED ORDER — FOLIC ACID 1 MG PO TABS: 1.0000 mg | ORAL_TABLET | Freq: Every day | ORAL | Status: DC

## 2015-03-24 MED ORDER — METOPROLOL SUCCINATE ER 100 MG PO TB24: 100.0000 mg | ORAL_TABLET | Freq: Every day | ORAL | Status: DC

## 2015-03-24 MED ORDER — FUROSEMIDE 40 MG PO TABS: 40.0000 mg | ORAL_TABLET | Freq: Two times a day (BID) | ORAL | Status: DC

## 2015-03-24 NOTE — Addendum Note (Signed)
Addended by: Collie Siad on: 03/24/2015 04:22 PM   Modules accepted: Orders

## 2015-03-24 NOTE — Progress Notes (Signed)
CC: Katherine Cervantes is a 82 y.o. female is here for Diabetes   Subjective: HPI:  Follow-up atrial fibrillation: She decided that she does not want take anticoagulation. She tells me that it scares her and makes her feel bad. She is taking metoprolol on a daily basis with no known racing heart or irregular heartbeat. She denies any new motor or sensory disturbances.  Follow-up type 2 diabetes: No current antihypertensive medication regimen. She is watching what she eats and spaces reveals out throughout the day. She tries to stay active and is looking for to starting a water aerobics plans  Follow-up essential hypertension: Continues to take metoprolol on a daily basis without chest pain shortness of breath orthopnea but still has some peripheral edema. If she takes furosemide on a daily basis edema is not interfere with quality of life. She denies orthopnea.  Follow-up CAD: She is requesting a refill on metoprolol. She denies any chest pain or limb claudication.   Review Of Systems Outlined In HPI  Past Medical History  Diagnosis Date  . Dyslipidemia 09/27/2011  . Hypertension 09/27/2011  . CAD (coronary artery disease) 09/27/2011  . Atrial fibrillation (HCC) 09/27/2011  . History of vasculitis 09/27/2011  . Rheumatoid arthritis(714.0) 09/27/2011  . Osteoporosis 09/27/2011  . Diabetes mellitus without complication (HCC)   . Asthma   . PUD (peptic ulcer disease)   . Pacemaker     Past Surgical History  Procedure Laterality Date  . Appendectomy    . Cholecystectomy    . Abdominal hysterectomy    . Partial gastrectomy    . Ankle surgery    . Carpal tunnel     Family History  Problem Relation Age of Onset  . Hypertension Mother   . Cancer Brother   . Cancer Brother     Social History   Social History  . Marital Status: Widowed    Spouse Name: N/A  . Number of Children: 7  . Years of Education: N/A   Occupational History  . Not on file.   Social History Main Topics  .  Smoking status: Never Smoker   . Smokeless tobacco: Never Used  . Alcohol Use: 0.0 oz/week    0 Standard drinks or equivalent per week     Comment: Occasional  . Drug Use: No  . Sexual Activity: Not on file   Other Topics Concern  . Not on file   Social History Narrative     Objective: BP 153/72 mmHg  Pulse 73  Wt 241 lb (109.317 kg)  General: Alert and Oriented, No Acute Distress HEENT: Pupils equal, round, reactive to light. Conjunctivae clear.  Moist membranes pharynx unremarkable Lungs: Clear to auscultation bilaterally, no wheezing/ronchi/rales.  Comfortable work of breathing. Good air movement. Cardiac: Regular rate and rhythm. Normal S1/S2.  No murmurs, rubs, nor gallops.   Abdomen: Normal bowel sounds, soft and non tender without palpable masses. Extremities: 1+ pitting edema on the lower extremities from the forefoot to the shin..  Strong peripheral pulses.  Mental Status: No depression, anxiety, nor agitation. Skin: Warm and dry.  Assessment & Plan: Marca was seen today for diabetes.  Diagnoses and all orders for this visit:  Paroxysmal atrial fibrillation (HCC) -     furosemide (LASIX) 40 MG tablet; Take 1-1.5 tablets (40-60 mg total) by mouth 2 (two) times daily. To help with swelling.  Type 2 diabetes mellitus without complication, without long-term current use of insulin (HCC)  Chronic atrial fibrillation (HCC) -  metoprolol succinate (TOPROL-XL) 100 MG 24 hr tablet; Take 1 tablet (100 mg total) by mouth daily. Take with or immediately following a meal.  Essential hypertension -     metoprolol succinate (TOPROL-XL) 100 MG 24 hr tablet; Take 1 tablet (100 mg total) by mouth daily. Take with or immediately following a meal.  Pacemaker -     metoprolol succinate (TOPROL-XL) 100 MG 24 hr tablet; Take 1 tablet (100 mg total) by mouth daily. Take with or immediately following a meal.  Coronary artery disease due to lipid rich plaque -     metoprolol succinate  (TOPROL-XL) 100 MG 24 hr tablet; Take 1 tablet (100 mg total) by mouth daily. Take with or immediately following a meal.  Other orders -     folic acid (FOLVITE) 1 MG tablet; Take 1 tablet (1 mg total) by mouth daily.   Atrial fibrillation: Currently rate controlled, she is not interested in anticoagulation however I did discuss that it would significantly reduce her risk of stroke. She is aware of this. Essential hypertension: Uncontrolled chronic condition increasing metoprolol Coronary artery disease: She tells me she has a severe allergy to aspirin is not interested in trying this. She does need folic acid refilled for her methotrexate use. Type 2 diabetes: A1c of 6.5, she's made a drastic difference in the last 2 years with improving her A1c. No need for medications for blood sugar at this time.  Return in about 3 months (around 06/21/2015).

## 2015-03-28 ENCOUNTER — Telehealth: Payer: Self-pay | Admitting: Cardiology

## 2015-03-28 NOTE — Telephone Encounter (Signed)
Left detailed message for  Katherine Cervantes, there is no diagnosis of heart failure in the pts chart. Her last EF% on echo from 2014 was 60%. She is to contact us with further questions.

## 2015-03-28 NOTE — Telephone Encounter (Signed)
NewMessage  Rep from [facility] calling to confim a dx of HF. Please call back and discuss.

## 2015-03-30 ENCOUNTER — Other Ambulatory Visit (INDEPENDENT_AMBULATORY_CARE_PROVIDER_SITE_OTHER): Payer: Medicare Other

## 2015-03-30 DIAGNOSIS — R8299 Other abnormal findings in urine: Secondary | ICD-10-CM | POA: Diagnosis not present

## 2015-03-30 DIAGNOSIS — R8290 Unspecified abnormal findings in urine: Secondary | ICD-10-CM

## 2015-03-30 LAB — POCT URINALYSIS DIPSTICK
BILIRUBIN UA: NEGATIVE
Blood, UA: NEGATIVE
Glucose, UA: NEGATIVE
KETONES UA: NEGATIVE
Leukocytes, UA: NEGATIVE
Nitrite, UA: NEGATIVE
PH UA: 5.5
Protein, UA: NEGATIVE
Spec Grav, UA: 1.02
Urobilinogen, UA: 0.2

## 2015-03-31 NOTE — Progress Notes (Signed)
Katherine Cervantes, This patient's urine sample should have had a urine microalbumin:creatine test performed and not a UA.  Can you please ask her to resubmit another sample.

## 2015-03-31 NOTE — Progress Notes (Signed)
Pt notified. Pt stated that she now have blue & black bruises on her wrist that she was complaining about. This is just an [place].

## 2015-04-20 ENCOUNTER — Other Ambulatory Visit (INDEPENDENT_AMBULATORY_CARE_PROVIDER_SITE_OTHER): Payer: Medicare Other | Admitting: Family Medicine

## 2015-04-20 DIAGNOSIS — E119 Type 2 diabetes mellitus without complications: Secondary | ICD-10-CM

## 2015-04-20 LAB — POCT UA - MICROALBUMIN
Albumin/Creatinine Ratio, Urine, POC: <30
Creatinine, POC: 10 mg/dL
Microalbumin Ur, POC: 10 mg/L

## 2015-05-02 ENCOUNTER — Ambulatory Visit (HOSPITAL_COMMUNITY)
Admission: RE | Admit: 2015-05-02 | Discharge: 2015-05-02 | Disposition: A | Discharge status: Home / Self Care | Payer: Medicare Other | Source: Ambulatory Visit | Attending: Cardiovascular Disease | Admitting: Cardiovascular Disease

## 2015-05-02 DIAGNOSIS — Z950 Presence of cardiac pacemaker: Secondary | ICD-10-CM | POA: Diagnosis not present

## 2015-05-02 DIAGNOSIS — I779 Disorder of arteries and arterioles, unspecified: Secondary | ICD-10-CM

## 2015-05-02 DIAGNOSIS — I10 Essential (primary) hypertension: Secondary | ICD-10-CM | POA: Insufficient documentation

## 2015-05-02 DIAGNOSIS — E119 Type 2 diabetes mellitus without complications: Secondary | ICD-10-CM | POA: Diagnosis not present

## 2015-05-02 DIAGNOSIS — E785 Hyperlipidemia, unspecified: Secondary | ICD-10-CM | POA: Diagnosis not present

## 2015-05-02 DIAGNOSIS — I739 Peripheral vascular disease, unspecified: Secondary | ICD-10-CM

## 2015-05-02 DIAGNOSIS — I2510 Atherosclerotic heart disease of native coronary artery without angina pectoris: Secondary | ICD-10-CM | POA: Insufficient documentation

## 2015-05-02 DIAGNOSIS — I4891 Unspecified atrial fibrillation: Secondary | ICD-10-CM | POA: Diagnosis not present

## 2015-05-02 DIAGNOSIS — I6523 Occlusion and stenosis of bilateral carotid arteries: Secondary | ICD-10-CM | POA: Diagnosis not present

## 2015-05-03 ENCOUNTER — Ambulatory Visit (HOSPITAL_COMMUNITY): Payer: Medicare Other

## 2015-05-11 ENCOUNTER — Other Ambulatory Visit: Payer: Self-pay | Admitting: Family Medicine

## 2015-06-13 ENCOUNTER — Encounter: Payer: Self-pay | Admitting: Internal Medicine

## 2015-06-13 ENCOUNTER — Ambulatory Visit (INDEPENDENT_AMBULATORY_CARE_PROVIDER_SITE_OTHER): Payer: Medicare Other | Admitting: Internal Medicine

## 2015-06-13 VITALS — BP 162/84 | HR 83 | Ht 60.0 in | Wt 244.2 lb

## 2015-06-13 DIAGNOSIS — I480 Paroxysmal atrial fibrillation: Secondary | ICD-10-CM

## 2015-06-13 DIAGNOSIS — Z950 Presence of cardiac pacemaker: Secondary | ICD-10-CM

## 2015-06-13 LAB — CUP PACEART INCLINIC DEVICE CHECK
Brady Statistic RV Percent Paced: 69 %
Date Time Interrogation Session: 20170502171139
Implantable Lead Implant Date: 20090521
Implantable Lead Implant Date: 20090521
Implantable Lead Location: 753860
Lead Channel Impedance Value: 344 Ohm
Lead Channel Pacing Threshold Amplitude: 1 V
Lead Channel Pacing Threshold Pulse Width: 0.6 ms
Lead Channel Sensing Intrinsic Amplitude: 10.6 mV
Lead Channel Setting Pacing Amplitude: 2 V
Lead Channel Setting Pacing Pulse Width: 0.6 ms
Lead Channel Setting Sensing Sensitivity: 2.5 mV
MDC IDC LEAD LOCATION: 753859
MDC IDC MSMT BATTERY IMPEDANCE: 3300 Ohm
MDC IDC MSMT BATTERY VOLTAGE: 2.75 V
MDC IDC MSMT LEADCHNL RA IMPEDANCE VALUE: 232 Ohm
MDC IDC MSMT LEADCHNL RA PACING THRESHOLD AMPLITUDE: 0.5 V
MDC IDC MSMT LEADCHNL RA PACING THRESHOLD PULSEWIDTH: 0.6 ms
MDC IDC MSMT LEADCHNL RA SENSING INTR AMPL: 1 mV
MDC IDC STAT BRADY RA PERCENT PACED: 18 %
Pulse Gen Model: 5816
Pulse Gen Serial Number: 1227916

## 2015-06-13 MED ORDER — WARFARIN SODIUM 3 MG PO TABS: 3.0000 mg | ORAL_TABLET | Freq: Every day | ORAL | Status: DC

## 2015-06-13 NOTE — Progress Notes (Signed)
HPI Katherine Cervantes returns today for ongoing evaluation and management of her PPM and atrial fib. She is a pleasant 82 yo woman with a h/o symptomatic bradycardia due to sinus node dysfunction for nearly 10 years. She has had atrial fibrillation which is mostly asymptomatic and appears based on PM interogation to be paroxysmal. She has moved from [place] to [place] to be closer to family. She denies syncope. She admits to non-compliance. She was started on Eliquis by Dr. [name] but c/o trouble with the cost. She is willing to take Warfarin. The patient c/o problems with arthritis and difficulty losing weight.  Allergies  Allergen Reactions  . Aspirin Anaphylaxis  . Codeine Other (See Comments)  . Morphine And Related Other (See Comments)  . Other Other (See Comments)  . Penicillins Other (See Comments)  . Sulfa Antibiotics Other (See Comments)  . Ether   . Levaquin [Levofloxacin In D5w]   . Metoprolol     Possible  . Prednisone   . Procaine   . Toprol Xl  [Metoprolol Tartrate]     Possible     Current Outpatient Prescriptions  Medication Sig Dispense Refill  . folic acid (FOLVITE) 1 MG tablet Take 1 tablet (1 mg total) by mouth daily. 90 tablet 1  . furosemide (LASIX) 40 MG tablet Take 1-1.5 tablets (40-60 mg total) by mouth 2 (two) times daily. To help with swelling. 180 tablet 3  . methotrexate (RHEUMATREX) 2.5 MG tablet TAKE 3 TABLETS BY MOUTH EVERY WEEK 12 tablet 0  . metoprolol succinate (TOPROL-XL) 100 MG 24 hr tablet Take 1 tablet (100 mg total) by mouth daily. Take with or immediately following a meal. 90 tablet 3  . nitroGLYCERIN (NITROSTAT) 0.4 MG SL tablet Place 0.4 mg under the tongue every 5 (five) minutes as needed for chest pain.    . traMADol-acetaminophen (ULTRACET) 37.5-325 MG tablet TAKE ONE TABLET BY MOUTH EVERY 8 HOURS AS NEEDED FOR ARTHRITIS PAIN 45 tablet 0  . warfarin (COUMADIN) 3 MG tablet Take 1 tablet (3 mg total) by mouth daily. 45 tablet 3    No current facility-administered medications for this visit.     Past Medical History  Diagnosis Date  . Dyslipidemia 09/27/2011  . Hypertension 09/27/2011  . CAD (coronary artery disease) 09/27/2011  . Atrial fibrillation (HCC) 09/27/2011  . History of vasculitis 09/27/2011  . Rheumatoid arthritis(714.0) 09/27/2011  . Osteoporosis 09/27/2011  . Diabetes mellitus without complication (HCC)   . Asthma   . PUD (peptic ulcer disease)   . Pacemaker     ROS:   All systems reviewed and negative except as noted in the HPI.   Past Surgical History  Procedure Laterality Date  . Appendectomy    . Cholecystectomy    . Abdominal hysterectomy    . Partial gastrectomy    . Ankle surgery    . Carpal tunnel       Family History  Problem Relation Age of Onset  . Hypertension Mother   . Cancer Brother   . Cancer Brother      Social History   Social History  . Marital Status: Widowed    Spouse Name: N/A  . Number of Children: 7  . Years of Education: N/A   Occupational History  . Not on file.   Social History Main Topics  . Smoking status: Never Smoker   . Smokeless tobacco: Never Used  . Alcohol Use: 0.0 oz/week    0 Standard drinks or  equivalent per week     Comment: Occasional  . Drug Use: No  . Sexual Activity: Not on file   Other Topics Concern  . Not on file   Social History Narrative     BP 162/84 mmHg  Pulse 83  Ht 5' (1.524 m)  Wt 244 lb 3.2 oz (110.768 kg)  BMI 47.69 kg/m2  Physical Exam:  Well appearing elderly woman, obese, NAD HEENT: Unremarkable Neck:  6 cm JVD, no thyromegally Back:  No CVA tenderness Lungs:  Clear with no wheezes HEART:  Regular rate rhythm, no murmurs, no rubs, no clicks Abd:  soft, obese, positive bowel sounds, no organomegally, no rebound, no guarding Ext:  2 plus pulses, no edema, no cyanosis, no clubbing Skin:  No rashes no nodules Neuro:  CN II through XII intact, motor grossly intact  ECG - nsr with atrial  pacing and RBBB/fusion   DEVICE  Normal device function.  See PaceArt for details. Underlying rhythm today is sinus  Assess/Plan: 1. Complete heart block - she is conducting today but with a long AV delay. She paces over 65% of the time despite her long AV delay. Will tighten up the AV delay to a more physiologic interval. 2. Morbid obesity - she is encouraged to lose weight. 3. Rheumatoid arthritis - her symptoms are fairly well controlled. She is unable to take steroids. 4. PPM - her [employer] device is working normally. Will recheck in several months.  5. PAF - she is unwilling to take Eliquis. We will start coumadin and refer to clinic.  Leonia Reeves.D.

## 2015-06-13 NOTE — Patient Instructions (Addendum)
Medication Instructions:  Your physician has recommended you make the following change in your medication:  1) Stop Eliquis 2) Restart Warfarin 3 mg --and follow up with PCP next week--Dr Katrine Coho: None ordered   Testing/Procedures: None ordered     Follow-Up:   Your physician wants you to follow-up in: 6 months with device clinic and 12 months with Dr Court Joy will receive a reminder letter in the mail two months in advance. If you don't receive a letter, please call our office to schedule the follow-up appointment.       Any Other Special Instructions Will Be Listed Below (If Applicable).     If you need a refill on your cardiac medications before your next appointment, please call your pharmacy.

## 2015-08-20 DIAGNOSIS — G4730 Sleep apnea, unspecified: Secondary | ICD-10-CM | POA: Diagnosis not present

## 2015-08-20 DIAGNOSIS — Z882 Allergy status to sulfonamides status: Secondary | ICD-10-CM | POA: Diagnosis not present

## 2015-08-20 DIAGNOSIS — I10 Essential (primary) hypertension: Secondary | ICD-10-CM | POA: Diagnosis not present

## 2015-08-20 DIAGNOSIS — Z7901 Long term (current) use of anticoagulants: Secondary | ICD-10-CM | POA: Diagnosis not present

## 2015-08-20 DIAGNOSIS — Z885 Allergy status to narcotic agent status: Secondary | ICD-10-CM | POA: Diagnosis not present

## 2015-08-20 DIAGNOSIS — R51 Headache: Secondary | ICD-10-CM | POA: Diagnosis not present

## 2015-08-20 DIAGNOSIS — I2510 Atherosclerotic heart disease of native coronary artery without angina pectoris: Secondary | ICD-10-CM | POA: Diagnosis not present

## 2015-08-20 DIAGNOSIS — R509 Fever, unspecified: Secondary | ICD-10-CM | POA: Diagnosis not present

## 2015-08-20 DIAGNOSIS — Z950 Presence of cardiac pacemaker: Secondary | ICD-10-CM | POA: Diagnosis not present

## 2015-08-20 DIAGNOSIS — I4891 Unspecified atrial fibrillation: Secondary | ICD-10-CM | POA: Diagnosis not present

## 2015-08-20 DIAGNOSIS — Z880 Allergy status to penicillin: Secondary | ICD-10-CM | POA: Diagnosis not present

## 2015-08-20 DIAGNOSIS — Z79899 Other long term (current) drug therapy: Secondary | ICD-10-CM | POA: Diagnosis not present

## 2015-08-20 DIAGNOSIS — M109 Gout, unspecified: Secondary | ICD-10-CM | POA: Diagnosis not present

## 2015-08-20 DIAGNOSIS — Z888 Allergy status to other drugs, medicaments and biological substances status: Secondary | ICD-10-CM | POA: Diagnosis not present

## 2015-08-20 DIAGNOSIS — L03116 Cellulitis of left lower limb: Secondary | ICD-10-CM | POA: Diagnosis not present

## 2015-08-20 DIAGNOSIS — M069 Rheumatoid arthritis, unspecified: Secondary | ICD-10-CM | POA: Diagnosis not present

## 2015-08-20 DIAGNOSIS — R110 Nausea: Secondary | ICD-10-CM | POA: Diagnosis not present

## 2015-08-20 DIAGNOSIS — R0602 Shortness of breath: Secondary | ICD-10-CM | POA: Diagnosis not present

## 2015-08-20 DIAGNOSIS — R160 Hepatomegaly, not elsewhere classified: Secondary | ICD-10-CM | POA: Diagnosis not present

## 2015-08-20 DIAGNOSIS — Z96659 Presence of unspecified artificial knee joint: Secondary | ICD-10-CM | POA: Diagnosis not present

## 2015-08-20 DIAGNOSIS — E785 Hyperlipidemia, unspecified: Secondary | ICD-10-CM | POA: Diagnosis not present

## 2015-08-21 ENCOUNTER — Ambulatory Visit (INDEPENDENT_AMBULATORY_CARE_PROVIDER_SITE_OTHER): Payer: Medicare Other | Admitting: Family Medicine

## 2015-08-21 VITALS — BP 134/77 | HR 83

## 2015-08-21 DIAGNOSIS — I10 Essential (primary) hypertension: Secondary | ICD-10-CM

## 2015-08-21 DIAGNOSIS — L03119 Cellulitis of unspecified part of limb: Secondary | ICD-10-CM | POA: Diagnosis not present

## 2015-08-21 DIAGNOSIS — R3911 Hesitancy of micturition: Secondary | ICD-10-CM

## 2015-08-21 MED ORDER — DOXYCYCLINE HYCLATE 100 MG PO TABS: ORAL_TABLET | ORAL | Status: AC

## 2015-08-22 ENCOUNTER — Encounter: Payer: Self-pay | Admitting: Family Medicine

## 2015-08-22 NOTE — Progress Notes (Signed)
CC: Katherine Cervantes is a 82 y.o. female is here for Cellulitis; Headache; Fatigue; and Dizziness   Subjective: HPI:  On the ninth of this month she noticed a red rash on the side of her left leg that was slightly itchy and tender to the touch. She doesn't recall it being there the night before. She went to an emergency room that evening due to her fever 100.4 and she received clindamycin IV and some fluids. She had a CT scan done of her chest, abdomen, pelvis, and brain all of which showed no infectious findings other than a swollen lymph node in the left inguinal region. Other than the single dose of clindamycin she received that night she has not been able to pick up prescription of clindamycin since. She tells that she doesn't feel any better or worse she still feels hot and cold and has some tenderness on her left leg at the site of the rash. She denies any joint pain at the site of the rash. She's had a little bit of nauseousness and decreased appetite.  A urine sample was obtained at the emergency room which showed some blood, protein and white blood cells. She does not she's had urinary hesitancy and incontinence over the past 2 or 3 days but no back pain. She denies any fecal incontinence. She brings a urine sample today that his opaque and cloudy. She denies any flank pain or abdominal pain.   Review Of Systems Outlined In HPI  Past Medical History  Diagnosis Date  . Dyslipidemia 09/27/2011  . Hypertension 09/27/2011  . CAD (coronary artery disease) 09/27/2011  . Atrial fibrillation (HCC) 09/27/2011  . History of vasculitis 09/27/2011  . Rheumatoid arthritis(714.0) 09/27/2011  . Osteoporosis 09/27/2011  . Diabetes mellitus without complication (HCC)   . Asthma   . PUD (peptic ulcer disease)   . Pacemaker     Past Surgical History  Procedure Laterality Date  . Appendectomy    . Cholecystectomy    . Abdominal hysterectomy    . Partial gastrectomy    . Ankle surgery    . Carpal  tunnel     Family History  Problem Relation Age of Onset  . Hypertension Mother   . Cancer Brother   . Cancer Brother     Social History   Social History  . Marital Status: Widowed    Spouse Name: N/A  . Number of Children: 7  . Years of Education: N/A   Occupational History  . Not on file.   Social History Main Topics  . Smoking status: Never Smoker   . Smokeless tobacco: Never Used  . Alcohol Use: 0.0 oz/week    0 Standard drinks or equivalent per week     Comment: Occasional  . Drug Use: No  . Sexual Activity: Not on file   Other Topics Concern  . Not on file   Social History Narrative     Objective: BP 134/77 mmHg  Pulse 83  General: Alert and Oriented, No Acute Distress HEENT: Pupils equal, round, reactive to light. Conjunctivae clear.  External ears unremarkable, canals clear with intact TMs with appropriate landmarks.  Middle ear appears open without effusion. Pink inferior turbinates.  Moist mucous membranes, pharynx without inflammation nor lesions.  Neck supple without palpable lymphadenopathy nor abnormal masses. Lungs: Clear to auscultation bilaterally, no wheezing/ronchi/rales.  Comfortable work of breathing. Good air movement. Cardiac: Regular rate and rhythm. Normal S1/S2.  No murmurs, rubs, nor gallops.   Extremities: .  Strong peripheral pulses.  Just beneath the left knee extending towards the malleolus there is a patch of erythema about the size of a football. This is warm to the touch no signs of abscess. Mental Status: No depression, anxiety, nor agitation. Skin: Warm and dry.  Assessment & Plan: Katherine Cervantes was seen today for cellulitis, headache, fatigue and dizziness.  Diagnoses and all orders for this visit:  Cellulitis of lower extremity, unspecified laterality -     doxycycline (VIBRA-TABS) 100 MG tablet; One by mouth twice a day for ten days.  Essential hypertension  Urinary hesitancy -     Urine culture   Cellulitis: Given her age of  first have double coverage for both staph and strep therefore continue clindamycin once she gets up from the pharmacy after this visit and begin doxycycline as well. Urinary hesitancy is likely reflecting a urinary tract infection given her recent urinalysis. We sent the urine sample that she provided to [place] today to the lab for a urine culture. She has a list of allergies that restricts what antibiotics can be used to treat this. Based on her most recent urine culture I'm hopeful that doxycycline will be helpful if she has Klebsiella again since this effective in  in vitro studies. Signs and symptoms requring emergent/urgent reevaluation were discussed with the patient.   Return if symptoms worsen or fail to improve.

## 2015-08-23 LAB — URINE CULTURE: Colony Count: 40000

## 2015-08-24 ENCOUNTER — Ambulatory Visit (INDEPENDENT_AMBULATORY_CARE_PROVIDER_SITE_OTHER): Payer: Medicare Other | Admitting: Family Medicine

## 2015-08-24 ENCOUNTER — Encounter: Payer: Self-pay | Admitting: Family Medicine

## 2015-08-24 VITALS — BP 156/78 | HR 70 | Temp 97.5°F

## 2015-08-24 DIAGNOSIS — Z882 Allergy status to sulfonamides status: Secondary | ICD-10-CM | POA: Diagnosis not present

## 2015-08-24 DIAGNOSIS — Z7901 Long term (current) use of anticoagulants: Secondary | ICD-10-CM | POA: Diagnosis not present

## 2015-08-24 DIAGNOSIS — D649 Anemia, unspecified: Secondary | ICD-10-CM | POA: Diagnosis not present

## 2015-08-24 DIAGNOSIS — I10 Essential (primary) hypertension: Secondary | ICD-10-CM | POA: Diagnosis not present

## 2015-08-24 DIAGNOSIS — M109 Gout, unspecified: Secondary | ICD-10-CM | POA: Diagnosis not present

## 2015-08-24 DIAGNOSIS — M7989 Other specified soft tissue disorders: Secondary | ICD-10-CM | POA: Diagnosis not present

## 2015-08-24 DIAGNOSIS — M810 Age-related osteoporosis without current pathological fracture: Secondary | ICD-10-CM | POA: Diagnosis not present

## 2015-08-24 DIAGNOSIS — I482 Chronic atrial fibrillation: Secondary | ICD-10-CM | POA: Diagnosis not present

## 2015-08-24 DIAGNOSIS — I2510 Atherosclerotic heart disease of native coronary artery without angina pectoris: Secondary | ICD-10-CM | POA: Diagnosis not present

## 2015-08-24 DIAGNOSIS — Z950 Presence of cardiac pacemaker: Secondary | ICD-10-CM | POA: Diagnosis not present

## 2015-08-24 DIAGNOSIS — G4730 Sleep apnea, unspecified: Secondary | ICD-10-CM | POA: Diagnosis not present

## 2015-08-24 DIAGNOSIS — E119 Type 2 diabetes mellitus without complications: Secondary | ICD-10-CM | POA: Diagnosis not present

## 2015-08-24 DIAGNOSIS — Z888 Allergy status to other drugs, medicaments and biological substances status: Secondary | ICD-10-CM | POA: Diagnosis not present

## 2015-08-24 DIAGNOSIS — Z886 Allergy status to analgesic agent status: Secondary | ICD-10-CM | POA: Diagnosis not present

## 2015-08-24 DIAGNOSIS — Z880 Allergy status to penicillin: Secondary | ICD-10-CM | POA: Diagnosis not present

## 2015-08-24 DIAGNOSIS — L03116 Cellulitis of left lower limb: Secondary | ICD-10-CM | POA: Diagnosis not present

## 2015-08-24 DIAGNOSIS — R791 Abnormal coagulation profile: Secondary | ICD-10-CM | POA: Diagnosis not present

## 2015-08-24 DIAGNOSIS — S81802A Unspecified open wound, left lower leg, initial encounter: Secondary | ICD-10-CM | POA: Diagnosis not present

## 2015-08-24 DIAGNOSIS — I517 Cardiomegaly: Secondary | ICD-10-CM | POA: Diagnosis not present

## 2015-08-24 DIAGNOSIS — M05271 Rheumatoid vasculitis with rheumatoid arthritis of right ankle and foot: Secondary | ICD-10-CM | POA: Diagnosis not present

## 2015-08-24 DIAGNOSIS — L0390 Cellulitis, unspecified: Secondary | ICD-10-CM | POA: Diagnosis not present

## 2015-08-24 DIAGNOSIS — Z884 Allergy status to anesthetic agent status: Secondary | ICD-10-CM | POA: Diagnosis not present

## 2015-08-24 DIAGNOSIS — E876 Hypokalemia: Secondary | ICD-10-CM | POA: Diagnosis not present

## 2015-08-24 DIAGNOSIS — M05272 Rheumatoid vasculitis with rheumatoid arthritis of left ankle and foot: Secondary | ICD-10-CM | POA: Diagnosis not present

## 2015-08-24 DIAGNOSIS — Z881 Allergy status to other antibiotic agents status: Secondary | ICD-10-CM | POA: Diagnosis not present

## 2015-08-24 DIAGNOSIS — R6883 Chills (without fever): Secondary | ICD-10-CM | POA: Diagnosis not present

## 2015-08-24 DIAGNOSIS — Z79899 Other long term (current) drug therapy: Secondary | ICD-10-CM | POA: Diagnosis not present

## 2015-08-24 DIAGNOSIS — Z885 Allergy status to narcotic agent status: Secondary | ICD-10-CM | POA: Diagnosis not present

## 2015-08-24 DIAGNOSIS — M6281 Muscle weakness (generalized): Secondary | ICD-10-CM | POA: Diagnosis not present

## 2015-08-24 DIAGNOSIS — R269 Unspecified abnormalities of gait and mobility: Secondary | ICD-10-CM | POA: Diagnosis not present

## 2015-08-24 NOTE — Patient Instructions (Signed)
Thank you for coming in today. Go to the emergency room Call the office if they think it shingles and you need me to prescribe shingles medicines.

## 2015-08-24 NOTE — Progress Notes (Signed)
Katherine Cervantes is a 82 y.o. female who presents to [name]: Primary Care Sports Medicine today for left leg rash. Patient has for the last several days been treated for a left leg cellulitis initially with clindamycin subsequently with clindamycin and doxycycline. The rash in the lateral aspect of her left leg has improved however starting since last night she developed significant medial leg pain and large erythematous rash with blisters. She notes some subjective fevers and chills without any significant nausea vomiting or diarrhea. She's not quite sure if the current rash is similar to previous episodes of shingles or not. He was worse than she did a few days ago. She currently takes both clindamycin and doxycycline. She is significantly allergic to sulfa antibiotics and penicillin-related antibiotics.   Past Medical History  Diagnosis Date  . Dyslipidemia 09/27/2011  . Hypertension 09/27/2011  . CAD (coronary artery disease) 09/27/2011  . Atrial fibrillation (HCC) 09/27/2011  . History of vasculitis 09/27/2011  . Rheumatoid arthritis(714.0) 09/27/2011  . Osteoporosis 09/27/2011  . Diabetes mellitus without complication (HCC)   . Asthma   . PUD (peptic ulcer disease)   . Pacemaker    Past Surgical History  Procedure Laterality Date  . Appendectomy    . Cholecystectomy    . Abdominal hysterectomy    . Partial gastrectomy    . Ankle surgery    . Carpal tunnel     Social History  Substance Use Topics  . Smoking status: Never Smoker   . Smokeless tobacco: Never Used  . Alcohol Use: 0.0 oz/week    0 Standard drinks or equivalent per week     Comment: Occasional   family history includes Cancer in her brother and brother; Hypertension in her mother.  ROS as above:  Medications: Current Outpatient Prescriptions  Medication Sig Dispense Refill  . doxycycline (VIBRA-TABS) 100 MG tablet  One by mouth twice a day for ten days. 20 tablet 0  . folic acid (FOLVITE) 1 MG tablet Take 1 tablet (1 mg total) by mouth daily. 90 tablet 1  . furosemide (LASIX) 40 MG tablet Take 1-1.5 tablets (40-60 mg total) by mouth 2 (two) times daily. To help with swelling. 180 tablet 3  . methotrexate (RHEUMATREX) 2.5 MG tablet TAKE 3 TABLETS BY MOUTH EVERY WEEK 12 tablet 0  . metoprolol succinate (TOPROL-XL) 100 MG 24 hr tablet Take 1 tablet (100 mg total) by mouth daily. Take with or immediately following a meal. 90 tablet 3  . nitroGLYCERIN (NITROSTAT) 0.4 MG SL tablet Place 0.4 mg under the tongue every 5 (five) minutes as needed for chest pain.    . traMADol-acetaminophen (ULTRACET) 37.5-325 MG tablet TAKE ONE TABLET BY MOUTH EVERY 8 HOURS AS NEEDED FOR ARTHRITIS PAIN 45 tablet 0  . warfarin (COUMADIN) 3 MG tablet Take 1 tablet (3 mg total) by mouth daily. 45 tablet 3   No current facility-administered medications for this visit.   Allergies  Allergen Reactions  . Aspirin Anaphylaxis  . Codeine Other (See Comments)  . Morphine And Related Other (See Comments)  . Other Other (See Comments)  . Penicillins Other (See Comments)  . Sulfa Antibiotics Other (See Comments)  . Ether   . Levaquin [Levofloxacin In D5w]   . Metoprolol     Possible  . Prednisone   . Procaine   . Toprol Xl  [Metoprolol Tartrate]     Possible     Exam:  BP 156/78 mmHg  Pulse 70  Temp(Src) 97.5 F (36.4 C) (Oral)  SpO2 96% Gen: Well NAD HEENT: EOMI,  MMM Lungs: Normal work of breathing. CTABL Heart: RRR no MRG Abd: NABS, Soft. Nondistended, Nontender Exts: warm and well perfused.  Large 10 x 10 cm erythematous patch on the medial leg near the knee with large blisters. Tender to touch. The lateral area of erythema is not particular tender and mild and small.  A culture of a freshly ruptured blister was obtained for bacteria and for zoster.   No results found for this or any previous visit (from the past 24  hour(s)). No results found.    Assessment and Plan: 82 y.o. female with left leg rash concerning for worsening cellulitis. At this point if it is bacterial cellulitis she has failed management with the available oral antibiotics of doxycycline and clindamycin. Unfortunately she is significantly allergic to penicillins as well as sulfa. There really is no other viable oral antibiotic at this time. A wound culture is pending.  Plan to transfer to the emergency department for further evaluation and management hopefully treatment with IV antibiotics.  Certainly this could be shingles. A zoster PCR is also pending.  These results should be visible using care everywhere in the next few days.  Discussed warning signs or symptoms. Please see discharge instructions. Patient expresses understanding.  CC: Laren Boom, DO

## 2015-08-26 LAB — VARICELLA-ZOSTER BY PCR: VZV DNA, QL PCR: NOT DETECTED

## 2015-08-27 LAB — WOUND CULTURE
Gram Stain: NONE SEEN
Organism ID, Bacteria: NO GROWTH

## 2015-08-28 DIAGNOSIS — Z7901 Long term (current) use of anticoagulants: Secondary | ICD-10-CM | POA: Insufficient documentation

## 2015-08-28 DIAGNOSIS — R21 Rash and other nonspecific skin eruption: Secondary | ICD-10-CM | POA: Insufficient documentation

## 2015-08-28 NOTE — Progress Notes (Signed)
Quick Note:  Shingles was not detected.  Culture is negative so far. ______

## 2015-08-29 ENCOUNTER — Telehealth: Payer: Self-pay | Admitting: Family Medicine

## 2015-08-29 DIAGNOSIS — Z792 Long term (current) use of antibiotics: Secondary | ICD-10-CM | POA: Diagnosis not present

## 2015-08-29 DIAGNOSIS — I872 Venous insufficiency (chronic) (peripheral): Secondary | ICD-10-CM | POA: Diagnosis not present

## 2015-08-29 DIAGNOSIS — E785 Hyperlipidemia, unspecified: Secondary | ICD-10-CM | POA: Diagnosis not present

## 2015-08-29 DIAGNOSIS — Z950 Presence of cardiac pacemaker: Secondary | ICD-10-CM | POA: Diagnosis not present

## 2015-08-29 DIAGNOSIS — D849 Immunodeficiency, unspecified: Secondary | ICD-10-CM | POA: Diagnosis not present

## 2015-08-29 DIAGNOSIS — E119 Type 2 diabetes mellitus without complications: Secondary | ICD-10-CM | POA: Diagnosis not present

## 2015-08-29 DIAGNOSIS — Z4800 Encounter for change or removal of nonsurgical wound dressing: Secondary | ICD-10-CM | POA: Diagnosis not present

## 2015-08-29 DIAGNOSIS — L03116 Cellulitis of left lower limb: Secondary | ICD-10-CM | POA: Diagnosis not present

## 2015-08-29 DIAGNOSIS — I482 Chronic atrial fibrillation: Secondary | ICD-10-CM | POA: Diagnosis not present

## 2015-08-29 DIAGNOSIS — Z7901 Long term (current) use of anticoagulants: Secondary | ICD-10-CM | POA: Diagnosis not present

## 2015-08-29 DIAGNOSIS — I10 Essential (primary) hypertension: Secondary | ICD-10-CM | POA: Diagnosis not present

## 2015-08-29 DIAGNOSIS — M069 Rheumatoid arthritis, unspecified: Secondary | ICD-10-CM | POA: Diagnosis not present

## 2015-08-29 DIAGNOSIS — L989 Disorder of the skin and subcutaneous tissue, unspecified: Secondary | ICD-10-CM | POA: Diagnosis not present

## 2015-08-29 DIAGNOSIS — M109 Gout, unspecified: Secondary | ICD-10-CM | POA: Diagnosis not present

## 2015-08-29 DIAGNOSIS — L97929 Non-pressure chronic ulcer of unspecified part of left lower leg with unspecified severity: Secondary | ICD-10-CM | POA: Diagnosis not present

## 2015-08-29 DIAGNOSIS — Z79891 Long term (current) use of opiate analgesic: Secondary | ICD-10-CM | POA: Diagnosis not present

## 2015-08-29 DIAGNOSIS — I2510 Atherosclerotic heart disease of native coronary artery without angina pectoris: Secondary | ICD-10-CM | POA: Diagnosis not present

## 2015-08-29 NOTE — Telephone Encounter (Signed)
Will you please let patient in the pharmacy know that according to the records that I see from her hospitalization with [facility] she received both ceftriaxone and cefuroxime in the hospital without any signs of allergic reaction. I believe it safe for her to continue taking oral cefuroxime.

## 2015-08-29 NOTE — Telephone Encounter (Signed)
I have no further recommendations 

## 2015-08-29 NOTE — Telephone Encounter (Signed)
Katherine Cervantes [phone]) from [facility] called to state the hospital discharge paperwork recommended an aquacel dressing be applied to leg wound. They do not have any in stock so will do vaseline dressing until it comes in. Plan is to change dressing daily.  Pt also is supposed to get her PT/INR done. These results will be sent to PCP and Cardiologist.  Pt was written Rx's for doxycycline and clindamycin. Pt refuses to take clindamycin (due to allergies) and has not started Rx for doxycycline yet. Will route to PCP to see if only Doxy Rx is appropriate.

## 2015-08-29 NOTE — Telephone Encounter (Signed)
Pt was seen in [facility] ED 08/24/15.  They Rx her cefuroxime for cellulitis.  The pharmacy noticed that she has an allergy to penicillin and explained that she could also have an reaction to this medication as well.  Katherine Cervantes is refusing to take the med because of her allergy and is wanting you to Rx her something different. Please advise.

## 2015-08-29 NOTE — Telephone Encounter (Signed)
After explaining to pt that you are ok with her taking cefuroxime she is still refusing to take medication. Please advise.

## 2015-08-30 ENCOUNTER — Telehealth: Payer: Self-pay

## 2015-08-30 DIAGNOSIS — Z7901 Long term (current) use of anticoagulants: Secondary | ICD-10-CM | POA: Diagnosis not present

## 2015-08-30 DIAGNOSIS — Z4800 Encounter for change or removal of nonsurgical wound dressing: Secondary | ICD-10-CM | POA: Diagnosis not present

## 2015-08-30 DIAGNOSIS — I10 Essential (primary) hypertension: Secondary | ICD-10-CM | POA: Diagnosis not present

## 2015-08-30 DIAGNOSIS — M069 Rheumatoid arthritis, unspecified: Secondary | ICD-10-CM | POA: Diagnosis not present

## 2015-08-30 DIAGNOSIS — E785 Hyperlipidemia, unspecified: Secondary | ICD-10-CM | POA: Diagnosis not present

## 2015-08-30 DIAGNOSIS — L97929 Non-pressure chronic ulcer of unspecified part of left lower leg with unspecified severity: Secondary | ICD-10-CM | POA: Diagnosis not present

## 2015-08-30 DIAGNOSIS — Z950 Presence of cardiac pacemaker: Secondary | ICD-10-CM | POA: Diagnosis not present

## 2015-08-30 DIAGNOSIS — Z792 Long term (current) use of antibiotics: Secondary | ICD-10-CM | POA: Diagnosis not present

## 2015-08-30 DIAGNOSIS — I2510 Atherosclerotic heart disease of native coronary artery without angina pectoris: Secondary | ICD-10-CM | POA: Diagnosis not present

## 2015-08-30 DIAGNOSIS — L03116 Cellulitis of left lower limb: Secondary | ICD-10-CM | POA: Diagnosis not present

## 2015-08-30 DIAGNOSIS — M109 Gout, unspecified: Secondary | ICD-10-CM | POA: Diagnosis not present

## 2015-08-30 DIAGNOSIS — Z79891 Long term (current) use of opiate analgesic: Secondary | ICD-10-CM | POA: Diagnosis not present

## 2015-08-30 DIAGNOSIS — I482 Chronic atrial fibrillation: Secondary | ICD-10-CM | POA: Diagnosis not present

## 2015-08-30 DIAGNOSIS — E119 Type 2 diabetes mellitus without complications: Secondary | ICD-10-CM | POA: Diagnosis not present

## 2015-08-30 DIAGNOSIS — D849 Immunodeficiency, unspecified: Secondary | ICD-10-CM | POA: Diagnosis not present

## 2015-08-30 DIAGNOSIS — I872 Venous insufficiency (chronic) (peripheral): Secondary | ICD-10-CM | POA: Diagnosis not present

## 2015-08-30 DIAGNOSIS — L989 Disorder of the skin and subcutaneous tissue, unspecified: Secondary | ICD-10-CM | POA: Diagnosis not present

## 2015-08-30 NOTE — Telephone Encounter (Signed)
Pt.notified

## 2015-08-30 NOTE — Telephone Encounter (Signed)
Christian [phone] ext [phone])  from [facility] called regarding Pt antibiotics. Advised of notes from this phone encounter, verbalized understanding. She will speak with the Pt as a last attempt. No further questions.

## 2015-08-30 NOTE — Telephone Encounter (Signed)
Home health nurse called to report Katherine Cervantes INR.  It was 2.0 and she is taking 3 mg qd.

## 2015-08-30 NOTE — Telephone Encounter (Signed)
If for any reason she is getting enoxaparin shots she can now discontinue this medication.

## 2015-08-30 NOTE — Telephone Encounter (Signed)
French Ana from [facility] called [phone]) and wants to know when INR needs to be recheck. Will route.

## 2015-08-30 NOTE — Telephone Encounter (Signed)
One week please

## 2015-08-31 DIAGNOSIS — Z792 Long term (current) use of antibiotics: Secondary | ICD-10-CM | POA: Diagnosis not present

## 2015-08-31 DIAGNOSIS — E785 Hyperlipidemia, unspecified: Secondary | ICD-10-CM | POA: Diagnosis not present

## 2015-08-31 DIAGNOSIS — L03116 Cellulitis of left lower limb: Secondary | ICD-10-CM | POA: Diagnosis not present

## 2015-08-31 DIAGNOSIS — L989 Disorder of the skin and subcutaneous tissue, unspecified: Secondary | ICD-10-CM | POA: Diagnosis not present

## 2015-08-31 DIAGNOSIS — Z950 Presence of cardiac pacemaker: Secondary | ICD-10-CM | POA: Diagnosis not present

## 2015-08-31 DIAGNOSIS — M069 Rheumatoid arthritis, unspecified: Secondary | ICD-10-CM | POA: Diagnosis not present

## 2015-08-31 DIAGNOSIS — M109 Gout, unspecified: Secondary | ICD-10-CM | POA: Diagnosis not present

## 2015-08-31 DIAGNOSIS — L97929 Non-pressure chronic ulcer of unspecified part of left lower leg with unspecified severity: Secondary | ICD-10-CM | POA: Diagnosis not present

## 2015-08-31 DIAGNOSIS — I10 Essential (primary) hypertension: Secondary | ICD-10-CM | POA: Diagnosis not present

## 2015-08-31 DIAGNOSIS — I872 Venous insufficiency (chronic) (peripheral): Secondary | ICD-10-CM | POA: Diagnosis not present

## 2015-08-31 DIAGNOSIS — I482 Chronic atrial fibrillation: Secondary | ICD-10-CM | POA: Diagnosis not present

## 2015-08-31 DIAGNOSIS — Z79891 Long term (current) use of opiate analgesic: Secondary | ICD-10-CM | POA: Diagnosis not present

## 2015-08-31 DIAGNOSIS — D849 Immunodeficiency, unspecified: Secondary | ICD-10-CM | POA: Diagnosis not present

## 2015-08-31 DIAGNOSIS — Z4800 Encounter for change or removal of nonsurgical wound dressing: Secondary | ICD-10-CM | POA: Diagnosis not present

## 2015-08-31 DIAGNOSIS — E119 Type 2 diabetes mellitus without complications: Secondary | ICD-10-CM | POA: Diagnosis not present

## 2015-08-31 DIAGNOSIS — Z7901 Long term (current) use of anticoagulants: Secondary | ICD-10-CM | POA: Diagnosis not present

## 2015-08-31 DIAGNOSIS — I2510 Atherosclerotic heart disease of native coronary artery without angina pectoris: Secondary | ICD-10-CM | POA: Diagnosis not present

## 2015-08-31 NOTE — Telephone Encounter (Signed)
Tracy notified.

## 2015-09-01 ENCOUNTER — Other Ambulatory Visit: Payer: Self-pay | Admitting: Family Medicine

## 2015-09-01 DIAGNOSIS — Z4800 Encounter for change or removal of nonsurgical wound dressing: Secondary | ICD-10-CM | POA: Diagnosis not present

## 2015-09-01 DIAGNOSIS — M069 Rheumatoid arthritis, unspecified: Secondary | ICD-10-CM | POA: Diagnosis not present

## 2015-09-01 DIAGNOSIS — I872 Venous insufficiency (chronic) (peripheral): Secondary | ICD-10-CM | POA: Diagnosis not present

## 2015-09-01 DIAGNOSIS — I482 Chronic atrial fibrillation: Secondary | ICD-10-CM | POA: Diagnosis not present

## 2015-09-01 DIAGNOSIS — Z792 Long term (current) use of antibiotics: Secondary | ICD-10-CM | POA: Diagnosis not present

## 2015-09-01 DIAGNOSIS — Z950 Presence of cardiac pacemaker: Secondary | ICD-10-CM | POA: Diagnosis not present

## 2015-09-01 DIAGNOSIS — I2510 Atherosclerotic heart disease of native coronary artery without angina pectoris: Secondary | ICD-10-CM | POA: Diagnosis not present

## 2015-09-01 DIAGNOSIS — M109 Gout, unspecified: Secondary | ICD-10-CM | POA: Diagnosis not present

## 2015-09-01 DIAGNOSIS — E119 Type 2 diabetes mellitus without complications: Secondary | ICD-10-CM | POA: Diagnosis not present

## 2015-09-01 DIAGNOSIS — E785 Hyperlipidemia, unspecified: Secondary | ICD-10-CM | POA: Diagnosis not present

## 2015-09-01 DIAGNOSIS — L03116 Cellulitis of left lower limb: Secondary | ICD-10-CM | POA: Diagnosis not present

## 2015-09-01 DIAGNOSIS — I10 Essential (primary) hypertension: Secondary | ICD-10-CM | POA: Diagnosis not present

## 2015-09-01 DIAGNOSIS — Z7901 Long term (current) use of anticoagulants: Secondary | ICD-10-CM | POA: Diagnosis not present

## 2015-09-01 DIAGNOSIS — L989 Disorder of the skin and subcutaneous tissue, unspecified: Secondary | ICD-10-CM | POA: Diagnosis not present

## 2015-09-01 DIAGNOSIS — D849 Immunodeficiency, unspecified: Secondary | ICD-10-CM | POA: Diagnosis not present

## 2015-09-01 DIAGNOSIS — Z79891 Long term (current) use of opiate analgesic: Secondary | ICD-10-CM | POA: Diagnosis not present

## 2015-09-01 DIAGNOSIS — L97929 Non-pressure chronic ulcer of unspecified part of left lower leg with unspecified severity: Secondary | ICD-10-CM | POA: Diagnosis not present

## 2015-09-04 DIAGNOSIS — Z4800 Encounter for change or removal of nonsurgical wound dressing: Secondary | ICD-10-CM | POA: Diagnosis not present

## 2015-09-04 DIAGNOSIS — I2510 Atherosclerotic heart disease of native coronary artery without angina pectoris: Secondary | ICD-10-CM | POA: Diagnosis not present

## 2015-09-04 DIAGNOSIS — M109 Gout, unspecified: Secondary | ICD-10-CM | POA: Diagnosis not present

## 2015-09-04 DIAGNOSIS — L989 Disorder of the skin and subcutaneous tissue, unspecified: Secondary | ICD-10-CM | POA: Diagnosis not present

## 2015-09-04 DIAGNOSIS — I482 Chronic atrial fibrillation: Secondary | ICD-10-CM | POA: Diagnosis not present

## 2015-09-04 DIAGNOSIS — Z950 Presence of cardiac pacemaker: Secondary | ICD-10-CM | POA: Diagnosis not present

## 2015-09-04 DIAGNOSIS — I10 Essential (primary) hypertension: Secondary | ICD-10-CM | POA: Diagnosis not present

## 2015-09-04 DIAGNOSIS — Z7901 Long term (current) use of anticoagulants: Secondary | ICD-10-CM | POA: Diagnosis not present

## 2015-09-04 DIAGNOSIS — E785 Hyperlipidemia, unspecified: Secondary | ICD-10-CM | POA: Diagnosis not present

## 2015-09-04 DIAGNOSIS — D849 Immunodeficiency, unspecified: Secondary | ICD-10-CM | POA: Diagnosis not present

## 2015-09-04 DIAGNOSIS — L97929 Non-pressure chronic ulcer of unspecified part of left lower leg with unspecified severity: Secondary | ICD-10-CM | POA: Diagnosis not present

## 2015-09-04 DIAGNOSIS — M069 Rheumatoid arthritis, unspecified: Secondary | ICD-10-CM | POA: Diagnosis not present

## 2015-09-04 DIAGNOSIS — E119 Type 2 diabetes mellitus without complications: Secondary | ICD-10-CM | POA: Diagnosis not present

## 2015-09-04 DIAGNOSIS — Z79891 Long term (current) use of opiate analgesic: Secondary | ICD-10-CM | POA: Diagnosis not present

## 2015-09-04 DIAGNOSIS — L03116 Cellulitis of left lower limb: Secondary | ICD-10-CM | POA: Diagnosis not present

## 2015-09-04 DIAGNOSIS — Z792 Long term (current) use of antibiotics: Secondary | ICD-10-CM | POA: Diagnosis not present

## 2015-09-04 DIAGNOSIS — I872 Venous insufficiency (chronic) (peripheral): Secondary | ICD-10-CM | POA: Diagnosis not present

## 2015-09-05 DIAGNOSIS — M069 Rheumatoid arthritis, unspecified: Secondary | ICD-10-CM | POA: Diagnosis not present

## 2015-09-05 DIAGNOSIS — E785 Hyperlipidemia, unspecified: Secondary | ICD-10-CM | POA: Diagnosis not present

## 2015-09-05 DIAGNOSIS — I10 Essential (primary) hypertension: Secondary | ICD-10-CM | POA: Diagnosis not present

## 2015-09-05 DIAGNOSIS — Z7901 Long term (current) use of anticoagulants: Secondary | ICD-10-CM | POA: Diagnosis not present

## 2015-09-05 DIAGNOSIS — I872 Venous insufficiency (chronic) (peripheral): Secondary | ICD-10-CM | POA: Diagnosis not present

## 2015-09-05 DIAGNOSIS — E119 Type 2 diabetes mellitus without complications: Secondary | ICD-10-CM | POA: Diagnosis not present

## 2015-09-05 DIAGNOSIS — M109 Gout, unspecified: Secondary | ICD-10-CM | POA: Diagnosis not present

## 2015-09-05 DIAGNOSIS — I482 Chronic atrial fibrillation: Secondary | ICD-10-CM | POA: Diagnosis not present

## 2015-09-05 DIAGNOSIS — D849 Immunodeficiency, unspecified: Secondary | ICD-10-CM | POA: Diagnosis not present

## 2015-09-05 DIAGNOSIS — Z792 Long term (current) use of antibiotics: Secondary | ICD-10-CM | POA: Diagnosis not present

## 2015-09-05 DIAGNOSIS — Z950 Presence of cardiac pacemaker: Secondary | ICD-10-CM | POA: Diagnosis not present

## 2015-09-05 DIAGNOSIS — L03116 Cellulitis of left lower limb: Secondary | ICD-10-CM | POA: Diagnosis not present

## 2015-09-05 DIAGNOSIS — L97929 Non-pressure chronic ulcer of unspecified part of left lower leg with unspecified severity: Secondary | ICD-10-CM | POA: Diagnosis not present

## 2015-09-05 DIAGNOSIS — Z4800 Encounter for change or removal of nonsurgical wound dressing: Secondary | ICD-10-CM | POA: Diagnosis not present

## 2015-09-05 DIAGNOSIS — Z79891 Long term (current) use of opiate analgesic: Secondary | ICD-10-CM | POA: Diagnosis not present

## 2015-09-05 DIAGNOSIS — L989 Disorder of the skin and subcutaneous tissue, unspecified: Secondary | ICD-10-CM | POA: Diagnosis not present

## 2015-09-05 DIAGNOSIS — I2510 Atherosclerotic heart disease of native coronary artery without angina pectoris: Secondary | ICD-10-CM | POA: Diagnosis not present

## 2015-09-07 DIAGNOSIS — M069 Rheumatoid arthritis, unspecified: Secondary | ICD-10-CM | POA: Diagnosis not present

## 2015-09-07 DIAGNOSIS — Z792 Long term (current) use of antibiotics: Secondary | ICD-10-CM | POA: Diagnosis not present

## 2015-09-07 DIAGNOSIS — D849 Immunodeficiency, unspecified: Secondary | ICD-10-CM | POA: Diagnosis not present

## 2015-09-07 DIAGNOSIS — M109 Gout, unspecified: Secondary | ICD-10-CM | POA: Diagnosis not present

## 2015-09-07 DIAGNOSIS — Z79891 Long term (current) use of opiate analgesic: Secondary | ICD-10-CM | POA: Diagnosis not present

## 2015-09-07 DIAGNOSIS — L989 Disorder of the skin and subcutaneous tissue, unspecified: Secondary | ICD-10-CM | POA: Diagnosis not present

## 2015-09-07 DIAGNOSIS — I872 Venous insufficiency (chronic) (peripheral): Secondary | ICD-10-CM | POA: Diagnosis not present

## 2015-09-07 DIAGNOSIS — L97929 Non-pressure chronic ulcer of unspecified part of left lower leg with unspecified severity: Secondary | ICD-10-CM | POA: Diagnosis not present

## 2015-09-07 DIAGNOSIS — Z7901 Long term (current) use of anticoagulants: Secondary | ICD-10-CM | POA: Diagnosis not present

## 2015-09-07 DIAGNOSIS — E785 Hyperlipidemia, unspecified: Secondary | ICD-10-CM | POA: Diagnosis not present

## 2015-09-07 DIAGNOSIS — E119 Type 2 diabetes mellitus without complications: Secondary | ICD-10-CM | POA: Diagnosis not present

## 2015-09-07 DIAGNOSIS — L03119 Cellulitis of unspecified part of limb: Secondary | ICD-10-CM | POA: Diagnosis not present

## 2015-09-07 DIAGNOSIS — I2510 Atherosclerotic heart disease of native coronary artery without angina pectoris: Secondary | ICD-10-CM | POA: Diagnosis not present

## 2015-09-07 DIAGNOSIS — L03116 Cellulitis of left lower limb: Secondary | ICD-10-CM | POA: Diagnosis not present

## 2015-09-07 DIAGNOSIS — Z4800 Encounter for change or removal of nonsurgical wound dressing: Secondary | ICD-10-CM | POA: Diagnosis not present

## 2015-09-07 DIAGNOSIS — Z950 Presence of cardiac pacemaker: Secondary | ICD-10-CM | POA: Diagnosis not present

## 2015-09-07 DIAGNOSIS — I482 Chronic atrial fibrillation: Secondary | ICD-10-CM | POA: Diagnosis not present

## 2015-09-07 DIAGNOSIS — I10 Essential (primary) hypertension: Secondary | ICD-10-CM | POA: Diagnosis not present

## 2015-09-08 DIAGNOSIS — M069 Rheumatoid arthritis, unspecified: Secondary | ICD-10-CM | POA: Diagnosis not present

## 2015-09-08 DIAGNOSIS — L97929 Non-pressure chronic ulcer of unspecified part of left lower leg with unspecified severity: Secondary | ICD-10-CM | POA: Diagnosis not present

## 2015-09-08 DIAGNOSIS — M109 Gout, unspecified: Secondary | ICD-10-CM | POA: Diagnosis not present

## 2015-09-08 DIAGNOSIS — I10 Essential (primary) hypertension: Secondary | ICD-10-CM | POA: Diagnosis not present

## 2015-09-08 DIAGNOSIS — Z79891 Long term (current) use of opiate analgesic: Secondary | ICD-10-CM | POA: Diagnosis not present

## 2015-09-08 DIAGNOSIS — Z7901 Long term (current) use of anticoagulants: Secondary | ICD-10-CM | POA: Diagnosis not present

## 2015-09-08 DIAGNOSIS — Z4800 Encounter for change or removal of nonsurgical wound dressing: Secondary | ICD-10-CM | POA: Diagnosis not present

## 2015-09-08 DIAGNOSIS — Z792 Long term (current) use of antibiotics: Secondary | ICD-10-CM | POA: Diagnosis not present

## 2015-09-08 DIAGNOSIS — I482 Chronic atrial fibrillation: Secondary | ICD-10-CM | POA: Diagnosis not present

## 2015-09-08 DIAGNOSIS — I2510 Atherosclerotic heart disease of native coronary artery without angina pectoris: Secondary | ICD-10-CM | POA: Diagnosis not present

## 2015-09-08 DIAGNOSIS — L989 Disorder of the skin and subcutaneous tissue, unspecified: Secondary | ICD-10-CM | POA: Diagnosis not present

## 2015-09-08 DIAGNOSIS — L03116 Cellulitis of left lower limb: Secondary | ICD-10-CM | POA: Diagnosis not present

## 2015-09-08 DIAGNOSIS — I872 Venous insufficiency (chronic) (peripheral): Secondary | ICD-10-CM | POA: Diagnosis not present

## 2015-09-08 DIAGNOSIS — D849 Immunodeficiency, unspecified: Secondary | ICD-10-CM | POA: Diagnosis not present

## 2015-09-08 DIAGNOSIS — E785 Hyperlipidemia, unspecified: Secondary | ICD-10-CM | POA: Diagnosis not present

## 2015-09-08 DIAGNOSIS — Z950 Presence of cardiac pacemaker: Secondary | ICD-10-CM | POA: Diagnosis not present

## 2015-09-08 DIAGNOSIS — E119 Type 2 diabetes mellitus without complications: Secondary | ICD-10-CM | POA: Diagnosis not present

## 2015-09-12 DIAGNOSIS — I10 Essential (primary) hypertension: Secondary | ICD-10-CM | POA: Diagnosis not present

## 2015-09-12 DIAGNOSIS — I2510 Atherosclerotic heart disease of native coronary artery without angina pectoris: Secondary | ICD-10-CM | POA: Diagnosis not present

## 2015-09-12 DIAGNOSIS — I482 Chronic atrial fibrillation: Secondary | ICD-10-CM | POA: Diagnosis not present

## 2015-09-12 DIAGNOSIS — Z79891 Long term (current) use of opiate analgesic: Secondary | ICD-10-CM | POA: Diagnosis not present

## 2015-09-12 DIAGNOSIS — I872 Venous insufficiency (chronic) (peripheral): Secondary | ICD-10-CM | POA: Diagnosis not present

## 2015-09-12 DIAGNOSIS — L989 Disorder of the skin and subcutaneous tissue, unspecified: Secondary | ICD-10-CM | POA: Diagnosis not present

## 2015-09-12 DIAGNOSIS — M109 Gout, unspecified: Secondary | ICD-10-CM | POA: Diagnosis not present

## 2015-09-12 DIAGNOSIS — L03116 Cellulitis of left lower limb: Secondary | ICD-10-CM | POA: Diagnosis not present

## 2015-09-12 DIAGNOSIS — E119 Type 2 diabetes mellitus without complications: Secondary | ICD-10-CM | POA: Diagnosis not present

## 2015-09-12 DIAGNOSIS — Z792 Long term (current) use of antibiotics: Secondary | ICD-10-CM | POA: Diagnosis not present

## 2015-09-12 DIAGNOSIS — Z4800 Encounter for change or removal of nonsurgical wound dressing: Secondary | ICD-10-CM | POA: Diagnosis not present

## 2015-09-12 DIAGNOSIS — L97929 Non-pressure chronic ulcer of unspecified part of left lower leg with unspecified severity: Secondary | ICD-10-CM | POA: Diagnosis not present

## 2015-09-12 DIAGNOSIS — E785 Hyperlipidemia, unspecified: Secondary | ICD-10-CM | POA: Diagnosis not present

## 2015-09-12 DIAGNOSIS — Z950 Presence of cardiac pacemaker: Secondary | ICD-10-CM | POA: Diagnosis not present

## 2015-09-12 DIAGNOSIS — Z7901 Long term (current) use of anticoagulants: Secondary | ICD-10-CM | POA: Diagnosis not present

## 2015-09-12 DIAGNOSIS — M069 Rheumatoid arthritis, unspecified: Secondary | ICD-10-CM | POA: Diagnosis not present

## 2015-09-12 DIAGNOSIS — D849 Immunodeficiency, unspecified: Secondary | ICD-10-CM | POA: Diagnosis not present

## 2015-09-13 ENCOUNTER — Telehealth: Payer: Self-pay

## 2015-09-13 DIAGNOSIS — I10 Essential (primary) hypertension: Secondary | ICD-10-CM | POA: Diagnosis not present

## 2015-09-13 DIAGNOSIS — Z4800 Encounter for change or removal of nonsurgical wound dressing: Secondary | ICD-10-CM | POA: Diagnosis not present

## 2015-09-13 DIAGNOSIS — E119 Type 2 diabetes mellitus without complications: Secondary | ICD-10-CM | POA: Diagnosis not present

## 2015-09-13 DIAGNOSIS — D849 Immunodeficiency, unspecified: Secondary | ICD-10-CM | POA: Diagnosis not present

## 2015-09-13 DIAGNOSIS — L03116 Cellulitis of left lower limb: Secondary | ICD-10-CM | POA: Diagnosis not present

## 2015-09-13 DIAGNOSIS — L97929 Non-pressure chronic ulcer of unspecified part of left lower leg with unspecified severity: Secondary | ICD-10-CM | POA: Diagnosis not present

## 2015-09-13 DIAGNOSIS — L989 Disorder of the skin and subcutaneous tissue, unspecified: Secondary | ICD-10-CM | POA: Diagnosis not present

## 2015-09-13 DIAGNOSIS — Z792 Long term (current) use of antibiotics: Secondary | ICD-10-CM | POA: Diagnosis not present

## 2015-09-13 DIAGNOSIS — I482 Chronic atrial fibrillation: Secondary | ICD-10-CM | POA: Diagnosis not present

## 2015-09-13 DIAGNOSIS — Z950 Presence of cardiac pacemaker: Secondary | ICD-10-CM | POA: Diagnosis not present

## 2015-09-13 DIAGNOSIS — E785 Hyperlipidemia, unspecified: Secondary | ICD-10-CM | POA: Diagnosis not present

## 2015-09-13 DIAGNOSIS — Z7901 Long term (current) use of anticoagulants: Secondary | ICD-10-CM | POA: Diagnosis not present

## 2015-09-13 DIAGNOSIS — M069 Rheumatoid arthritis, unspecified: Secondary | ICD-10-CM | POA: Diagnosis not present

## 2015-09-13 DIAGNOSIS — I872 Venous insufficiency (chronic) (peripheral): Secondary | ICD-10-CM | POA: Diagnosis not present

## 2015-09-13 DIAGNOSIS — Z79891 Long term (current) use of opiate analgesic: Secondary | ICD-10-CM | POA: Diagnosis not present

## 2015-09-13 DIAGNOSIS — I2510 Atherosclerotic heart disease of native coronary artery without angina pectoris: Secondary | ICD-10-CM | POA: Diagnosis not present

## 2015-09-13 DIAGNOSIS — M109 Gout, unspecified: Secondary | ICD-10-CM | POA: Diagnosis not present

## 2015-09-13 NOTE — Telephone Encounter (Signed)
Pt vm not set up

## 2015-09-13 NOTE — Telephone Encounter (Signed)
Perfect, continue 3mg  of coumadin daily and recheck inr in one month

## 2015-09-14 DIAGNOSIS — Z79891 Long term (current) use of opiate analgesic: Secondary | ICD-10-CM | POA: Diagnosis not present

## 2015-09-14 DIAGNOSIS — Z792 Long term (current) use of antibiotics: Secondary | ICD-10-CM | POA: Diagnosis not present

## 2015-09-14 DIAGNOSIS — I482 Chronic atrial fibrillation: Secondary | ICD-10-CM | POA: Diagnosis not present

## 2015-09-14 DIAGNOSIS — D849 Immunodeficiency, unspecified: Secondary | ICD-10-CM | POA: Diagnosis not present

## 2015-09-14 DIAGNOSIS — L03116 Cellulitis of left lower limb: Secondary | ICD-10-CM | POA: Diagnosis not present

## 2015-09-14 DIAGNOSIS — E785 Hyperlipidemia, unspecified: Secondary | ICD-10-CM | POA: Diagnosis not present

## 2015-09-14 DIAGNOSIS — I872 Venous insufficiency (chronic) (peripheral): Secondary | ICD-10-CM | POA: Diagnosis not present

## 2015-09-14 DIAGNOSIS — M109 Gout, unspecified: Secondary | ICD-10-CM | POA: Diagnosis not present

## 2015-09-14 DIAGNOSIS — I10 Essential (primary) hypertension: Secondary | ICD-10-CM | POA: Diagnosis not present

## 2015-09-14 DIAGNOSIS — Z4800 Encounter for change or removal of nonsurgical wound dressing: Secondary | ICD-10-CM | POA: Diagnosis not present

## 2015-09-14 DIAGNOSIS — I2510 Atherosclerotic heart disease of native coronary artery without angina pectoris: Secondary | ICD-10-CM | POA: Diagnosis not present

## 2015-09-14 DIAGNOSIS — Z950 Presence of cardiac pacemaker: Secondary | ICD-10-CM | POA: Diagnosis not present

## 2015-09-14 DIAGNOSIS — L97929 Non-pressure chronic ulcer of unspecified part of left lower leg with unspecified severity: Secondary | ICD-10-CM | POA: Diagnosis not present

## 2015-09-14 DIAGNOSIS — L989 Disorder of the skin and subcutaneous tissue, unspecified: Secondary | ICD-10-CM | POA: Diagnosis not present

## 2015-09-14 DIAGNOSIS — E119 Type 2 diabetes mellitus without complications: Secondary | ICD-10-CM | POA: Diagnosis not present

## 2015-09-14 DIAGNOSIS — M069 Rheumatoid arthritis, unspecified: Secondary | ICD-10-CM | POA: Diagnosis not present

## 2015-09-14 DIAGNOSIS — Z7901 Long term (current) use of anticoagulants: Secondary | ICD-10-CM | POA: Diagnosis not present

## 2015-09-15 DIAGNOSIS — I482 Chronic atrial fibrillation: Secondary | ICD-10-CM | POA: Diagnosis not present

## 2015-09-15 DIAGNOSIS — I872 Venous insufficiency (chronic) (peripheral): Secondary | ICD-10-CM | POA: Diagnosis not present

## 2015-09-15 DIAGNOSIS — M109 Gout, unspecified: Secondary | ICD-10-CM | POA: Diagnosis not present

## 2015-09-15 DIAGNOSIS — L989 Disorder of the skin and subcutaneous tissue, unspecified: Secondary | ICD-10-CM | POA: Diagnosis not present

## 2015-09-15 DIAGNOSIS — Z950 Presence of cardiac pacemaker: Secondary | ICD-10-CM | POA: Diagnosis not present

## 2015-09-15 DIAGNOSIS — E785 Hyperlipidemia, unspecified: Secondary | ICD-10-CM | POA: Diagnosis not present

## 2015-09-15 DIAGNOSIS — M069 Rheumatoid arthritis, unspecified: Secondary | ICD-10-CM | POA: Diagnosis not present

## 2015-09-15 DIAGNOSIS — Z792 Long term (current) use of antibiotics: Secondary | ICD-10-CM | POA: Diagnosis not present

## 2015-09-15 DIAGNOSIS — Z7901 Long term (current) use of anticoagulants: Secondary | ICD-10-CM | POA: Diagnosis not present

## 2015-09-15 DIAGNOSIS — I2510 Atherosclerotic heart disease of native coronary artery without angina pectoris: Secondary | ICD-10-CM | POA: Diagnosis not present

## 2015-09-15 DIAGNOSIS — Z4800 Encounter for change or removal of nonsurgical wound dressing: Secondary | ICD-10-CM | POA: Diagnosis not present

## 2015-09-15 DIAGNOSIS — D849 Immunodeficiency, unspecified: Secondary | ICD-10-CM | POA: Diagnosis not present

## 2015-09-15 DIAGNOSIS — L97929 Non-pressure chronic ulcer of unspecified part of left lower leg with unspecified severity: Secondary | ICD-10-CM | POA: Diagnosis not present

## 2015-09-15 DIAGNOSIS — E119 Type 2 diabetes mellitus without complications: Secondary | ICD-10-CM | POA: Diagnosis not present

## 2015-09-15 DIAGNOSIS — Z79891 Long term (current) use of opiate analgesic: Secondary | ICD-10-CM | POA: Diagnosis not present

## 2015-09-15 DIAGNOSIS — I10 Essential (primary) hypertension: Secondary | ICD-10-CM | POA: Diagnosis not present

## 2015-09-15 DIAGNOSIS — L03116 Cellulitis of left lower limb: Secondary | ICD-10-CM | POA: Diagnosis not present

## 2015-09-18 ENCOUNTER — Ambulatory Visit (INDEPENDENT_AMBULATORY_CARE_PROVIDER_SITE_OTHER): Payer: Medicare Other | Admitting: Family Medicine

## 2015-09-18 ENCOUNTER — Encounter: Payer: Self-pay | Admitting: Family Medicine

## 2015-09-18 VITALS — BP 145/83 | HR 69 | Wt 235.0 lb

## 2015-09-18 DIAGNOSIS — Z7901 Long term (current) use of anticoagulants: Secondary | ICD-10-CM | POA: Diagnosis not present

## 2015-09-18 DIAGNOSIS — L97929 Non-pressure chronic ulcer of unspecified part of left lower leg with unspecified severity: Secondary | ICD-10-CM | POA: Diagnosis not present

## 2015-09-18 DIAGNOSIS — E119 Type 2 diabetes mellitus without complications: Secondary | ICD-10-CM | POA: Diagnosis not present

## 2015-09-18 DIAGNOSIS — Z4800 Encounter for change or removal of nonsurgical wound dressing: Secondary | ICD-10-CM | POA: Diagnosis not present

## 2015-09-18 DIAGNOSIS — L989 Disorder of the skin and subcutaneous tissue, unspecified: Secondary | ICD-10-CM | POA: Diagnosis not present

## 2015-09-18 DIAGNOSIS — Z792 Long term (current) use of antibiotics: Secondary | ICD-10-CM | POA: Diagnosis not present

## 2015-09-18 DIAGNOSIS — I480 Paroxysmal atrial fibrillation: Secondary | ICD-10-CM | POA: Diagnosis not present

## 2015-09-18 DIAGNOSIS — M109 Gout, unspecified: Secondary | ICD-10-CM | POA: Diagnosis not present

## 2015-09-18 DIAGNOSIS — L03116 Cellulitis of left lower limb: Secondary | ICD-10-CM | POA: Diagnosis not present

## 2015-09-18 DIAGNOSIS — I2510 Atherosclerotic heart disease of native coronary artery without angina pectoris: Secondary | ICD-10-CM | POA: Diagnosis not present

## 2015-09-18 DIAGNOSIS — D849 Immunodeficiency, unspecified: Secondary | ICD-10-CM | POA: Diagnosis not present

## 2015-09-18 DIAGNOSIS — I872 Venous insufficiency (chronic) (peripheral): Secondary | ICD-10-CM | POA: Diagnosis not present

## 2015-09-18 DIAGNOSIS — I482 Chronic atrial fibrillation: Secondary | ICD-10-CM | POA: Diagnosis not present

## 2015-09-18 DIAGNOSIS — M069 Rheumatoid arthritis, unspecified: Secondary | ICD-10-CM | POA: Diagnosis not present

## 2015-09-18 DIAGNOSIS — Z950 Presence of cardiac pacemaker: Secondary | ICD-10-CM | POA: Diagnosis not present

## 2015-09-18 DIAGNOSIS — E785 Hyperlipidemia, unspecified: Secondary | ICD-10-CM | POA: Diagnosis not present

## 2015-09-18 DIAGNOSIS — I10 Essential (primary) hypertension: Secondary | ICD-10-CM | POA: Diagnosis not present

## 2015-09-18 DIAGNOSIS — Z79891 Long term (current) use of opiate analgesic: Secondary | ICD-10-CM | POA: Diagnosis not present

## 2015-09-18 LAB — POCT GLYCOSYLATED HEMOGLOBIN (HGB A1C): HEMOGLOBIN A1C: 6.3

## 2015-09-18 MED ORDER — TRAMADOL-ACETAMINOPHEN 37.5-325 MG PO TABS: ORAL_TABLET | ORAL | 1 refills | Status: DC

## 2015-09-18 MED ORDER — FOLIC ACID 1 MG PO TABS: 1.0000 mg | ORAL_TABLET | Freq: Every day | ORAL | 1 refills | Status: DC

## 2015-09-18 MED ORDER — METHOTREXATE 2.5 MG PO TABS: ORAL_TABLET | ORAL | 1 refills | Status: DC

## 2015-09-18 NOTE — Progress Notes (Signed)
CC: Katherine Cervantes is a 82 y.o. female is here for Medication Refill   Subjective: HPI:  Patient scheduled appointment to discuss transferring her care to a different provider after my departure.  Follow-up type 2 diabetes: No outside blood sugars to report. Currently not taking any antihyperglycemic medication. She stays active doing physical therapy exercises no other formal exercise routine. No polyuria or polyphagia or polydipsia  Follow-up atrial fibrillation: Last week she had a therapeutic INR after being on Coumadin 3 mg for over a week. She denies any compliance issues. She denies any bruising or bleeding abnormalities. As any irregular heartbeats or any motor or sensory disturbances.  Review Of Systems Outlined In HPI  Past Medical History:  Diagnosis Date  . Asthma   . Atrial fibrillation (HCC) 09/27/2011  . CAD (coronary artery disease) 09/27/2011  . Diabetes mellitus without complication (HCC)   . Dyslipidemia 09/27/2011  . History of vasculitis 09/27/2011  . Hypertension 09/27/2011  . Osteoporosis 09/27/2011  . Pacemaker   . PUD (peptic ulcer disease)   . Rheumatoid arthritis(714.0) 09/27/2011    Past Surgical History:  Procedure Laterality Date  . ABDOMINAL HYSTERECTOMY    . ANKLE SURGERY    . APPENDECTOMY    . Carpal tunnel    . CHOLECYSTECTOMY    . PARTIAL GASTRECTOMY     Family History  Problem Relation Age of Onset  . Hypertension Mother   . Cancer Brother   . Cancer Brother     Social History   Social History  . Marital status: Widowed    Spouse name: N/A  . Number of children: 7  . Years of education: N/A   Occupational History  . Not on file.   Social History Main Topics  . Smoking status: Never Smoker  . Smokeless tobacco: Never Used  . Alcohol use 0.0 oz/week     Comment: Occasional  . Drug use: No  . Sexual activity: Not on file   Other Topics Concern  . Not on file   Social History Narrative  . No narrative on file      Objective: BP (!) 145/83   Pulse 69   Wt 235 lb (106.6 kg)   BMI 45.90 kg/m   Vital signs reviewed. General: Alert and Oriented, No Acute Distress HEENT: Pupils equal, round, reactive to light. Conjunctivae clear.  External ears unremarkable.  Moist mucous membranes. Lungs: Clear and comfortable work of breathing, speaking in full sentences without accessory muscle use. Cardiac: Regular rate and rhythm.  Neuro: CN II-XII grossly intact, gait normal. Extremities: No peripheral edema.  Strong peripheral pulses.  Mental Status: No depression, anxiety, nor agitation. Logical though process. Skin: Warm and dry.  Assessment & Plan: Katherine Cervantes was seen today for medication refill.  Diagnoses and all orders for this visit:  Paroxysmal atrial fibrillation (HCC)  Type 2 diabetes mellitus without complication, without long-term current use of insulin (HCC)  Other orders -     traMADol-acetaminophen (ULTRACET) 37.5-325 MG tablet; TAKE ONE TABLET BY MOUTH EVERY 8 HOURS AS NEEDED FOR ARTHRITIS PAIN -     methotrexate (RHEUMATREX) 2.5 MG tablet; TAKE 3 TABLETS BY MOUTH EVERY WEEK -     folic acid (FOLVITE) 1 MG tablet; Take 1 tablet (1 mg total) by mouth daily.   Atrial fibrillation: Currently rate controlled with metoprolol, continue Coumadin 3 mg daily due for repeat INR with home health in 3 weeks. Type 2 diabetes:controlled with a1c of 6.3 no need to consider antihyperglycemic  medication at this time.  Discussed with this patient that I will be resigning from my position here with [facility] in September in order to stay with my family who will be moving to [facility]. I let him know about the providers that are still accepting patients and I feel that this individual will be under great care if he/she stays here with [facility].  Return in about 3 months (around 12/19/2015) for Jade F/U A Fib and Sugar.

## 2015-09-19 DIAGNOSIS — M109 Gout, unspecified: Secondary | ICD-10-CM | POA: Diagnosis not present

## 2015-09-19 DIAGNOSIS — Z7901 Long term (current) use of anticoagulants: Secondary | ICD-10-CM | POA: Diagnosis not present

## 2015-09-19 DIAGNOSIS — E119 Type 2 diabetes mellitus without complications: Secondary | ICD-10-CM | POA: Diagnosis not present

## 2015-09-19 DIAGNOSIS — E785 Hyperlipidemia, unspecified: Secondary | ICD-10-CM | POA: Diagnosis not present

## 2015-09-19 DIAGNOSIS — L989 Disorder of the skin and subcutaneous tissue, unspecified: Secondary | ICD-10-CM | POA: Diagnosis not present

## 2015-09-19 DIAGNOSIS — I872 Venous insufficiency (chronic) (peripheral): Secondary | ICD-10-CM | POA: Diagnosis not present

## 2015-09-19 DIAGNOSIS — L97929 Non-pressure chronic ulcer of unspecified part of left lower leg with unspecified severity: Secondary | ICD-10-CM | POA: Diagnosis not present

## 2015-09-19 DIAGNOSIS — M069 Rheumatoid arthritis, unspecified: Secondary | ICD-10-CM | POA: Diagnosis not present

## 2015-09-19 DIAGNOSIS — D849 Immunodeficiency, unspecified: Secondary | ICD-10-CM | POA: Diagnosis not present

## 2015-09-19 DIAGNOSIS — L03116 Cellulitis of left lower limb: Secondary | ICD-10-CM | POA: Diagnosis not present

## 2015-09-19 DIAGNOSIS — Z950 Presence of cardiac pacemaker: Secondary | ICD-10-CM | POA: Diagnosis not present

## 2015-09-19 DIAGNOSIS — I2510 Atherosclerotic heart disease of native coronary artery without angina pectoris: Secondary | ICD-10-CM | POA: Diagnosis not present

## 2015-09-19 DIAGNOSIS — Z4800 Encounter for change or removal of nonsurgical wound dressing: Secondary | ICD-10-CM | POA: Diagnosis not present

## 2015-09-19 DIAGNOSIS — I10 Essential (primary) hypertension: Secondary | ICD-10-CM | POA: Diagnosis not present

## 2015-09-19 DIAGNOSIS — Z79891 Long term (current) use of opiate analgesic: Secondary | ICD-10-CM | POA: Diagnosis not present

## 2015-09-19 DIAGNOSIS — Z792 Long term (current) use of antibiotics: Secondary | ICD-10-CM | POA: Diagnosis not present

## 2015-09-19 DIAGNOSIS — I482 Chronic atrial fibrillation: Secondary | ICD-10-CM | POA: Diagnosis not present

## 2015-09-20 DIAGNOSIS — I482 Chronic atrial fibrillation: Secondary | ICD-10-CM | POA: Diagnosis not present

## 2015-09-20 DIAGNOSIS — I872 Venous insufficiency (chronic) (peripheral): Secondary | ICD-10-CM | POA: Diagnosis not present

## 2015-09-20 DIAGNOSIS — L989 Disorder of the skin and subcutaneous tissue, unspecified: Secondary | ICD-10-CM | POA: Diagnosis not present

## 2015-09-20 DIAGNOSIS — I2510 Atherosclerotic heart disease of native coronary artery without angina pectoris: Secondary | ICD-10-CM | POA: Diagnosis not present

## 2015-09-20 DIAGNOSIS — D849 Immunodeficiency, unspecified: Secondary | ICD-10-CM | POA: Diagnosis not present

## 2015-09-20 DIAGNOSIS — I10 Essential (primary) hypertension: Secondary | ICD-10-CM | POA: Diagnosis not present

## 2015-09-20 DIAGNOSIS — L97929 Non-pressure chronic ulcer of unspecified part of left lower leg with unspecified severity: Secondary | ICD-10-CM | POA: Diagnosis not present

## 2015-09-20 DIAGNOSIS — Z792 Long term (current) use of antibiotics: Secondary | ICD-10-CM | POA: Diagnosis not present

## 2015-09-20 DIAGNOSIS — M069 Rheumatoid arthritis, unspecified: Secondary | ICD-10-CM | POA: Diagnosis not present

## 2015-09-20 DIAGNOSIS — E785 Hyperlipidemia, unspecified: Secondary | ICD-10-CM | POA: Diagnosis not present

## 2015-09-20 DIAGNOSIS — Z7901 Long term (current) use of anticoagulants: Secondary | ICD-10-CM | POA: Diagnosis not present

## 2015-09-20 DIAGNOSIS — Z79891 Long term (current) use of opiate analgesic: Secondary | ICD-10-CM | POA: Diagnosis not present

## 2015-09-20 DIAGNOSIS — Z950 Presence of cardiac pacemaker: Secondary | ICD-10-CM | POA: Diagnosis not present

## 2015-09-20 DIAGNOSIS — M109 Gout, unspecified: Secondary | ICD-10-CM | POA: Diagnosis not present

## 2015-09-20 DIAGNOSIS — L03116 Cellulitis of left lower limb: Secondary | ICD-10-CM | POA: Diagnosis not present

## 2015-09-20 DIAGNOSIS — Z4800 Encounter for change or removal of nonsurgical wound dressing: Secondary | ICD-10-CM | POA: Diagnosis not present

## 2015-09-20 DIAGNOSIS — E119 Type 2 diabetes mellitus without complications: Secondary | ICD-10-CM | POA: Diagnosis not present

## 2015-09-22 DIAGNOSIS — L97929 Non-pressure chronic ulcer of unspecified part of left lower leg with unspecified severity: Secondary | ICD-10-CM | POA: Diagnosis not present

## 2015-09-22 DIAGNOSIS — E119 Type 2 diabetes mellitus without complications: Secondary | ICD-10-CM | POA: Diagnosis not present

## 2015-09-22 DIAGNOSIS — L03116 Cellulitis of left lower limb: Secondary | ICD-10-CM | POA: Diagnosis not present

## 2015-09-22 DIAGNOSIS — L989 Disorder of the skin and subcutaneous tissue, unspecified: Secondary | ICD-10-CM | POA: Diagnosis not present

## 2015-09-22 DIAGNOSIS — Z79891 Long term (current) use of opiate analgesic: Secondary | ICD-10-CM | POA: Diagnosis not present

## 2015-09-22 DIAGNOSIS — E785 Hyperlipidemia, unspecified: Secondary | ICD-10-CM | POA: Diagnosis not present

## 2015-09-22 DIAGNOSIS — Z950 Presence of cardiac pacemaker: Secondary | ICD-10-CM | POA: Diagnosis not present

## 2015-09-22 DIAGNOSIS — I872 Venous insufficiency (chronic) (peripheral): Secondary | ICD-10-CM | POA: Diagnosis not present

## 2015-09-22 DIAGNOSIS — Z4800 Encounter for change or removal of nonsurgical wound dressing: Secondary | ICD-10-CM | POA: Diagnosis not present

## 2015-09-22 DIAGNOSIS — I482 Chronic atrial fibrillation: Secondary | ICD-10-CM | POA: Diagnosis not present

## 2015-09-22 DIAGNOSIS — M109 Gout, unspecified: Secondary | ICD-10-CM | POA: Diagnosis not present

## 2015-09-22 DIAGNOSIS — I10 Essential (primary) hypertension: Secondary | ICD-10-CM | POA: Diagnosis not present

## 2015-09-22 DIAGNOSIS — M069 Rheumatoid arthritis, unspecified: Secondary | ICD-10-CM | POA: Diagnosis not present

## 2015-09-22 DIAGNOSIS — I2510 Atherosclerotic heart disease of native coronary artery without angina pectoris: Secondary | ICD-10-CM | POA: Diagnosis not present

## 2015-09-22 DIAGNOSIS — Z7901 Long term (current) use of anticoagulants: Secondary | ICD-10-CM | POA: Diagnosis not present

## 2015-09-22 DIAGNOSIS — Z792 Long term (current) use of antibiotics: Secondary | ICD-10-CM | POA: Diagnosis not present

## 2015-09-22 DIAGNOSIS — D849 Immunodeficiency, unspecified: Secondary | ICD-10-CM | POA: Diagnosis not present

## 2015-09-25 DIAGNOSIS — L97929 Non-pressure chronic ulcer of unspecified part of left lower leg with unspecified severity: Secondary | ICD-10-CM | POA: Diagnosis not present

## 2015-09-25 DIAGNOSIS — D849 Immunodeficiency, unspecified: Secondary | ICD-10-CM | POA: Diagnosis not present

## 2015-09-25 DIAGNOSIS — E119 Type 2 diabetes mellitus without complications: Secondary | ICD-10-CM | POA: Diagnosis not present

## 2015-09-25 DIAGNOSIS — L989 Disorder of the skin and subcutaneous tissue, unspecified: Secondary | ICD-10-CM | POA: Diagnosis not present

## 2015-09-25 DIAGNOSIS — Z4800 Encounter for change or removal of nonsurgical wound dressing: Secondary | ICD-10-CM | POA: Diagnosis not present

## 2015-09-25 DIAGNOSIS — I2510 Atherosclerotic heart disease of native coronary artery without angina pectoris: Secondary | ICD-10-CM | POA: Diagnosis not present

## 2015-09-25 DIAGNOSIS — I872 Venous insufficiency (chronic) (peripheral): Secondary | ICD-10-CM | POA: Diagnosis not present

## 2015-09-25 DIAGNOSIS — I482 Chronic atrial fibrillation: Secondary | ICD-10-CM | POA: Diagnosis not present

## 2015-09-25 DIAGNOSIS — Z7901 Long term (current) use of anticoagulants: Secondary | ICD-10-CM | POA: Diagnosis not present

## 2015-09-25 DIAGNOSIS — M109 Gout, unspecified: Secondary | ICD-10-CM | POA: Diagnosis not present

## 2015-09-25 DIAGNOSIS — Z950 Presence of cardiac pacemaker: Secondary | ICD-10-CM | POA: Diagnosis not present

## 2015-09-25 DIAGNOSIS — E785 Hyperlipidemia, unspecified: Secondary | ICD-10-CM | POA: Diagnosis not present

## 2015-09-25 DIAGNOSIS — Z792 Long term (current) use of antibiotics: Secondary | ICD-10-CM | POA: Diagnosis not present

## 2015-09-25 DIAGNOSIS — I10 Essential (primary) hypertension: Secondary | ICD-10-CM | POA: Diagnosis not present

## 2015-09-25 DIAGNOSIS — M069 Rheumatoid arthritis, unspecified: Secondary | ICD-10-CM | POA: Diagnosis not present

## 2015-09-25 DIAGNOSIS — L03116 Cellulitis of left lower limb: Secondary | ICD-10-CM | POA: Diagnosis not present

## 2015-09-25 DIAGNOSIS — Z79891 Long term (current) use of opiate analgesic: Secondary | ICD-10-CM | POA: Diagnosis not present

## 2015-09-28 ENCOUNTER — Encounter: Payer: Self-pay | Admitting: Cardiology

## 2015-09-29 DIAGNOSIS — L03116 Cellulitis of left lower limb: Secondary | ICD-10-CM | POA: Diagnosis not present

## 2015-09-29 DIAGNOSIS — M109 Gout, unspecified: Secondary | ICD-10-CM | POA: Diagnosis not present

## 2015-09-29 DIAGNOSIS — I2510 Atherosclerotic heart disease of native coronary artery without angina pectoris: Secondary | ICD-10-CM | POA: Diagnosis not present

## 2015-09-29 DIAGNOSIS — Z79891 Long term (current) use of opiate analgesic: Secondary | ICD-10-CM | POA: Diagnosis not present

## 2015-09-29 DIAGNOSIS — I10 Essential (primary) hypertension: Secondary | ICD-10-CM | POA: Diagnosis not present

## 2015-09-29 DIAGNOSIS — Z7901 Long term (current) use of anticoagulants: Secondary | ICD-10-CM | POA: Diagnosis not present

## 2015-09-29 DIAGNOSIS — M069 Rheumatoid arthritis, unspecified: Secondary | ICD-10-CM | POA: Diagnosis not present

## 2015-09-29 DIAGNOSIS — Z950 Presence of cardiac pacemaker: Secondary | ICD-10-CM | POA: Diagnosis not present

## 2015-09-29 DIAGNOSIS — D849 Immunodeficiency, unspecified: Secondary | ICD-10-CM | POA: Diagnosis not present

## 2015-09-29 DIAGNOSIS — E119 Type 2 diabetes mellitus without complications: Secondary | ICD-10-CM | POA: Diagnosis not present

## 2015-09-29 DIAGNOSIS — L989 Disorder of the skin and subcutaneous tissue, unspecified: Secondary | ICD-10-CM | POA: Diagnosis not present

## 2015-09-29 DIAGNOSIS — I872 Venous insufficiency (chronic) (peripheral): Secondary | ICD-10-CM | POA: Diagnosis not present

## 2015-09-29 DIAGNOSIS — I482 Chronic atrial fibrillation: Secondary | ICD-10-CM | POA: Diagnosis not present

## 2015-09-29 DIAGNOSIS — Z4800 Encounter for change or removal of nonsurgical wound dressing: Secondary | ICD-10-CM | POA: Diagnosis not present

## 2015-09-29 DIAGNOSIS — E785 Hyperlipidemia, unspecified: Secondary | ICD-10-CM | POA: Diagnosis not present

## 2015-09-29 DIAGNOSIS — Z792 Long term (current) use of antibiotics: Secondary | ICD-10-CM | POA: Diagnosis not present

## 2015-09-29 DIAGNOSIS — L97929 Non-pressure chronic ulcer of unspecified part of left lower leg with unspecified severity: Secondary | ICD-10-CM | POA: Diagnosis not present

## 2015-10-04 DIAGNOSIS — M109 Gout, unspecified: Secondary | ICD-10-CM | POA: Diagnosis not present

## 2015-10-04 DIAGNOSIS — M069 Rheumatoid arthritis, unspecified: Secondary | ICD-10-CM | POA: Diagnosis not present

## 2015-10-04 DIAGNOSIS — L97929 Non-pressure chronic ulcer of unspecified part of left lower leg with unspecified severity: Secondary | ICD-10-CM | POA: Diagnosis not present

## 2015-10-04 DIAGNOSIS — E785 Hyperlipidemia, unspecified: Secondary | ICD-10-CM | POA: Diagnosis not present

## 2015-10-04 DIAGNOSIS — Z79891 Long term (current) use of opiate analgesic: Secondary | ICD-10-CM | POA: Diagnosis not present

## 2015-10-04 DIAGNOSIS — D849 Immunodeficiency, unspecified: Secondary | ICD-10-CM | POA: Diagnosis not present

## 2015-10-04 DIAGNOSIS — I2510 Atherosclerotic heart disease of native coronary artery without angina pectoris: Secondary | ICD-10-CM | POA: Diagnosis not present

## 2015-10-04 DIAGNOSIS — E119 Type 2 diabetes mellitus without complications: Secondary | ICD-10-CM | POA: Diagnosis not present

## 2015-10-04 DIAGNOSIS — I10 Essential (primary) hypertension: Secondary | ICD-10-CM | POA: Diagnosis not present

## 2015-10-04 DIAGNOSIS — L03116 Cellulitis of left lower limb: Secondary | ICD-10-CM | POA: Diagnosis not present

## 2015-10-04 DIAGNOSIS — Z7901 Long term (current) use of anticoagulants: Secondary | ICD-10-CM | POA: Diagnosis not present

## 2015-10-04 DIAGNOSIS — I872 Venous insufficiency (chronic) (peripheral): Secondary | ICD-10-CM | POA: Diagnosis not present

## 2015-10-04 DIAGNOSIS — Z950 Presence of cardiac pacemaker: Secondary | ICD-10-CM | POA: Diagnosis not present

## 2015-10-04 DIAGNOSIS — L989 Disorder of the skin and subcutaneous tissue, unspecified: Secondary | ICD-10-CM | POA: Diagnosis not present

## 2015-10-04 DIAGNOSIS — I482 Chronic atrial fibrillation: Secondary | ICD-10-CM | POA: Diagnosis not present

## 2015-10-04 DIAGNOSIS — Z792 Long term (current) use of antibiotics: Secondary | ICD-10-CM | POA: Diagnosis not present

## 2015-10-04 DIAGNOSIS — Z4800 Encounter for change or removal of nonsurgical wound dressing: Secondary | ICD-10-CM | POA: Diagnosis not present

## 2015-10-06 DIAGNOSIS — I872 Venous insufficiency (chronic) (peripheral): Secondary | ICD-10-CM | POA: Diagnosis not present

## 2015-10-06 DIAGNOSIS — I10 Essential (primary) hypertension: Secondary | ICD-10-CM | POA: Diagnosis not present

## 2015-10-06 DIAGNOSIS — Z950 Presence of cardiac pacemaker: Secondary | ICD-10-CM | POA: Diagnosis not present

## 2015-10-06 DIAGNOSIS — L97929 Non-pressure chronic ulcer of unspecified part of left lower leg with unspecified severity: Secondary | ICD-10-CM | POA: Diagnosis not present

## 2015-10-06 DIAGNOSIS — L03116 Cellulitis of left lower limb: Secondary | ICD-10-CM | POA: Diagnosis not present

## 2015-10-06 DIAGNOSIS — E119 Type 2 diabetes mellitus without complications: Secondary | ICD-10-CM | POA: Diagnosis not present

## 2015-10-06 DIAGNOSIS — D849 Immunodeficiency, unspecified: Secondary | ICD-10-CM | POA: Diagnosis not present

## 2015-10-06 DIAGNOSIS — Z792 Long term (current) use of antibiotics: Secondary | ICD-10-CM | POA: Diagnosis not present

## 2015-10-06 DIAGNOSIS — M069 Rheumatoid arthritis, unspecified: Secondary | ICD-10-CM | POA: Diagnosis not present

## 2015-10-06 DIAGNOSIS — Z7901 Long term (current) use of anticoagulants: Secondary | ICD-10-CM | POA: Diagnosis not present

## 2015-10-06 DIAGNOSIS — Z79891 Long term (current) use of opiate analgesic: Secondary | ICD-10-CM | POA: Diagnosis not present

## 2015-10-06 DIAGNOSIS — E785 Hyperlipidemia, unspecified: Secondary | ICD-10-CM | POA: Diagnosis not present

## 2015-10-06 DIAGNOSIS — L989 Disorder of the skin and subcutaneous tissue, unspecified: Secondary | ICD-10-CM | POA: Diagnosis not present

## 2015-10-06 DIAGNOSIS — I482 Chronic atrial fibrillation: Secondary | ICD-10-CM | POA: Diagnosis not present

## 2015-10-06 DIAGNOSIS — Z4800 Encounter for change or removal of nonsurgical wound dressing: Secondary | ICD-10-CM | POA: Diagnosis not present

## 2015-10-06 DIAGNOSIS — I2510 Atherosclerotic heart disease of native coronary artery without angina pectoris: Secondary | ICD-10-CM | POA: Diagnosis not present

## 2015-10-06 DIAGNOSIS — M109 Gout, unspecified: Secondary | ICD-10-CM | POA: Diagnosis not present

## 2015-10-09 DIAGNOSIS — Z7901 Long term (current) use of anticoagulants: Secondary | ICD-10-CM | POA: Diagnosis not present

## 2015-10-09 DIAGNOSIS — Z950 Presence of cardiac pacemaker: Secondary | ICD-10-CM | POA: Diagnosis not present

## 2015-10-09 DIAGNOSIS — M109 Gout, unspecified: Secondary | ICD-10-CM | POA: Diagnosis not present

## 2015-10-09 DIAGNOSIS — E119 Type 2 diabetes mellitus without complications: Secondary | ICD-10-CM | POA: Diagnosis not present

## 2015-10-09 DIAGNOSIS — Z4800 Encounter for change or removal of nonsurgical wound dressing: Secondary | ICD-10-CM | POA: Diagnosis not present

## 2015-10-09 DIAGNOSIS — I2510 Atherosclerotic heart disease of native coronary artery without angina pectoris: Secondary | ICD-10-CM | POA: Diagnosis not present

## 2015-10-09 DIAGNOSIS — L03116 Cellulitis of left lower limb: Secondary | ICD-10-CM | POA: Diagnosis not present

## 2015-10-09 DIAGNOSIS — I10 Essential (primary) hypertension: Secondary | ICD-10-CM | POA: Diagnosis not present

## 2015-10-09 DIAGNOSIS — Z792 Long term (current) use of antibiotics: Secondary | ICD-10-CM | POA: Diagnosis not present

## 2015-10-09 DIAGNOSIS — L989 Disorder of the skin and subcutaneous tissue, unspecified: Secondary | ICD-10-CM | POA: Diagnosis not present

## 2015-10-09 DIAGNOSIS — M069 Rheumatoid arthritis, unspecified: Secondary | ICD-10-CM | POA: Diagnosis not present

## 2015-10-09 DIAGNOSIS — I872 Venous insufficiency (chronic) (peripheral): Secondary | ICD-10-CM | POA: Diagnosis not present

## 2015-10-09 DIAGNOSIS — Z79891 Long term (current) use of opiate analgesic: Secondary | ICD-10-CM | POA: Diagnosis not present

## 2015-10-09 DIAGNOSIS — L97929 Non-pressure chronic ulcer of unspecified part of left lower leg with unspecified severity: Secondary | ICD-10-CM | POA: Diagnosis not present

## 2015-10-09 DIAGNOSIS — I482 Chronic atrial fibrillation: Secondary | ICD-10-CM | POA: Diagnosis not present

## 2015-10-09 DIAGNOSIS — E785 Hyperlipidemia, unspecified: Secondary | ICD-10-CM | POA: Diagnosis not present

## 2015-10-09 DIAGNOSIS — D849 Immunodeficiency, unspecified: Secondary | ICD-10-CM | POA: Diagnosis not present

## 2015-10-10 NOTE — Progress Notes (Signed)
HPI: FU atrial fibrillation/flutter. Patient has had previous care in [place]. Previous cath but no records available. Previous atrial flutter ablation and pacemaker. Nuclear study September 2011 showed ejection fraction 76% and no ischemia or infarction. Echocardiogram April 2016 showed normal LV function, grade 1 diastolic dysfunction and moderate left atrial enlargement. Carotid dopplers 3/17 showed 1-39 bilateral stenosis. Since last seen, She was admitted in [place] with cellulitis. She has had not recovered completely from this. She notes some dyspnea on exertion. She has chronic pedal edema. She has chest tightness with activities that has been present for years.  Current Outpatient Prescriptions  Medication Sig Dispense Refill  . folic acid (FOLVITE) 1 MG tablet Take 1 tablet (1 mg total) by mouth daily. 90 tablet 1  . furosemide (LASIX) 40 MG tablet Take 1-1.5 tablets (40-60 mg total) by mouth 2 (two) times daily. To help with swelling. 180 tablet 3  . methotrexate (RHEUMATREX) 2.5 MG tablet TAKE 3 TABLETS BY MOUTH EVERY WEEK (Patient taking differently: Take 3.5 mg by mouth once a week. TAKE 3 TABLETS BY MOUTH EVERY WEEK) 12 tablet 1  . metolazone (ZAROXOLYN) 5 MG tablet Take 5 mg by mouth daily as needed.    . metoprolol succinate (TOPROL-XL) 100 MG 24 hr tablet Take 1 tablet (100 mg total) by mouth daily. Take with or immediately following a meal. 90 tablet 3  . nitroGLYCERIN (NITROSTAT) 0.4 MG SL tablet Place 0.4 mg under the tongue every 5 (five) minutes as needed for chest pain.    . traMADol-acetaminophen (ULTRACET) 37.5-325 MG tablet TAKE ONE TABLET BY MOUTH EVERY 8 HOURS AS NEEDED FOR ARTHRITIS PAIN 45 tablet 1  . warfarin (COUMADIN) 3 MG tablet Take 1 tablet (3 mg total) by mouth daily. 45 tablet 3   No current facility-administered medications for this visit.      Past Medical History:  Diagnosis Date  . Asthma   . Atrial fibrillation (HCC) 09/27/2011  . CAD  (coronary artery disease) 09/27/2011  . Diabetes mellitus without complication (HCC)   . Dyslipidemia 09/27/2011  . History of vasculitis 09/27/2011  . Hypertension 09/27/2011  . Osteoporosis 09/27/2011  . Pacemaker   . PUD (peptic ulcer disease)   . Rheumatoid arthritis(714.0) 09/27/2011    Past Surgical History:  Procedure Laterality Date  . ABDOMINAL HYSTERECTOMY    . ANKLE SURGERY    . APPENDECTOMY    . Carpal tunnel    . CHOLECYSTECTOMY    . PARTIAL GASTRECTOMY      Social History   Social History  . Marital status: Widowed    Spouse name: N/A  . Number of children: 7  . Years of education: N/A   Occupational History  . Not on file.   Social History Main Topics  . Smoking status: Never Smoker  . Smokeless tobacco: Never Used  . Alcohol use 0.0 oz/week     Comment: Occasional  . Drug use: No  . Sexual activity: Not on file   Other Topics Concern  . Not on file   Social History Narrative  . No narrative on file    Family History  Problem Relation Age of Onset  . Hypertension Mother   . Cancer Brother   . Cancer Brother     ROS: no fevers or chills, productive cough, hemoptysis, dysphasia, odynophagia, melena, hematochezia, dysuria, hematuria, rash, seizure activity, orthopnea, PND, pedal edema, claudication. Remaining systems are negative.  Physical Exam: Well-developed obese in no acute distress.  Skin is warm and dry.  HEENT is normal.  Neck is supple.  Chest is clear to auscultation with normal expansion. Pacemaker left chest Cardiovascular exam is regular rate and rhythm.  Abdominal exam nontender or distended. No masses palpated. Extremities show 2+ edema. neuro grossly intact  ECG AV paced  A/P  1 Hypertension-Blood pressure elevated but she has not taken her medications this morning. She will monitor her blood pressure and we will increase Toprol if needed.  2 status post pacemaker-management per electrophysiology.  3 history of  paroxysmal atrial fibrillation. Risk factors for embolic event include age greater than 59, female sex and hypertension. Also with possible coronary disease. Continue Coumadin. Continue Toprol.  4 cerebrovascular disease-mild on most recent studies.  5 coronary artery disease-history unclear. Some chest discomfort with exertion chronically.  6 Lower extremity edema-continue present dose of diuretics. Check potassium and renal function.  Olga Millers, MD

## 2015-10-12 ENCOUNTER — Ambulatory Visit (INDEPENDENT_AMBULATORY_CARE_PROVIDER_SITE_OTHER): Payer: Medicare Other | Admitting: Cardiology

## 2015-10-12 ENCOUNTER — Encounter: Payer: Self-pay | Admitting: Cardiology

## 2015-10-12 VITALS — BP 154/84 | HR 60 | Ht 61.0 in | Wt 240.0 lb

## 2015-10-12 DIAGNOSIS — I10 Essential (primary) hypertension: Secondary | ICD-10-CM

## 2015-10-12 DIAGNOSIS — I4891 Unspecified atrial fibrillation: Secondary | ICD-10-CM

## 2015-10-12 DIAGNOSIS — Z79899 Other long term (current) drug therapy: Secondary | ICD-10-CM

## 2015-10-12 LAB — BASIC METABOLIC PANEL
BUN: 20 mg/dL (ref 7–25)
CALCIUM: 9.6 mg/dL (ref 8.6–10.4)
CO2: 23 mmol/L (ref 20–31)
CREATININE: 0.93 mg/dL — AB (ref 0.60–0.88)
Chloride: 103 mmol/L (ref 98–110)
Glucose, Bld: 87 mg/dL (ref 65–99)
Potassium: 4.1 mmol/L (ref 3.5–5.3)
Sodium: 140 mmol/L (ref 135–146)

## 2015-10-12 NOTE — Patient Instructions (Signed)
Medication Instructions:   NO CHANGES.  Labwork: Your physician recommends that you return for lab work TODAY.   Follow-Up: Your physician wants you to follow-up in: 6 MONTHS WITH DR CRENSHAW. You will receive a reminder letter in the mail two months in advance. If you don't receive a letter, please call our office to schedule the follow-up appointment.   If you need a refill on your cardiac medications before your next appointment, please call your pharmacy.

## 2015-10-13 ENCOUNTER — Ambulatory Visit (INDEPENDENT_AMBULATORY_CARE_PROVIDER_SITE_OTHER): Payer: Medicare Other | Admitting: Physician Assistant

## 2015-10-13 ENCOUNTER — Encounter: Payer: Self-pay | Admitting: Physician Assistant

## 2015-10-13 ENCOUNTER — Telehealth: Payer: Self-pay | Admitting: Cardiology

## 2015-10-13 VITALS — BP 156/79 | HR 75 | Ht 61.0 in | Wt 234.0 lb

## 2015-10-13 DIAGNOSIS — R195 Other fecal abnormalities: Secondary | ICD-10-CM

## 2015-10-13 DIAGNOSIS — E785 Hyperlipidemia, unspecified: Secondary | ICD-10-CM

## 2015-10-13 DIAGNOSIS — R159 Full incontinence of feces: Secondary | ICD-10-CM | POA: Diagnosis not present

## 2015-10-13 DIAGNOSIS — R32 Unspecified urinary incontinence: Secondary | ICD-10-CM

## 2015-10-13 DIAGNOSIS — Z79899 Other long term (current) drug therapy: Secondary | ICD-10-CM | POA: Diagnosis not present

## 2015-10-13 NOTE — Telephone Encounter (Signed)
Returned call to patient-made aware of lab results.  Verbalized understanding.

## 2015-10-13 NOTE — Telephone Encounter (Signed)
New message ° ° ° °Pt verbalized that she is returning call for rn  °

## 2015-10-14 LAB — LIPID PANEL
Cholesterol: 214 mg/dL — ABNORMAL HIGH (ref 125–200)
HDL: 66 mg/dL (ref >=46)
LDL CALC: 120 mg/dL (ref <130)
TRIGLYCERIDES: 141 mg/dL (ref <150)
Total CHOL/HDL Ratio: 3.2 Ratio (ref <=5.0)
VLDL: 28 mg/dL (ref <30)

## 2015-10-14 LAB — HEPATIC FUNCTION PANEL
ALK PHOS: 44 U/L (ref 33–130)
ALT: 10 U/L (ref 6–29)
AST: 17 U/L (ref 10–35)
Albumin: 4 g/dL (ref 3.6–5.1)
Bilirubin, Direct: 0.2 mg/dL (ref <=0.2)
Indirect Bilirubin: 0.6 mg/dL (ref 0.2–1.2)
TOTAL PROTEIN: 7.5 g/dL (ref 6.1–8.1)
Total Bilirubin: 0.8 mg/dL (ref 0.2–1.2)

## 2015-10-15 DIAGNOSIS — K921 Melena: Secondary | ICD-10-CM | POA: Insufficient documentation

## 2015-10-15 DIAGNOSIS — R32 Unspecified urinary incontinence: Secondary | ICD-10-CM

## 2015-10-15 DIAGNOSIS — R159 Full incontinence of feces: Secondary | ICD-10-CM | POA: Insufficient documentation

## 2015-10-15 HISTORY — DX: Unspecified urinary incontinence: R32

## 2015-10-16 DIAGNOSIS — Z792 Long term (current) use of antibiotics: Secondary | ICD-10-CM | POA: Diagnosis not present

## 2015-10-16 DIAGNOSIS — Z950 Presence of cardiac pacemaker: Secondary | ICD-10-CM | POA: Diagnosis not present

## 2015-10-16 DIAGNOSIS — I482 Chronic atrial fibrillation: Secondary | ICD-10-CM | POA: Diagnosis not present

## 2015-10-16 DIAGNOSIS — D849 Immunodeficiency, unspecified: Secondary | ICD-10-CM | POA: Diagnosis not present

## 2015-10-16 DIAGNOSIS — E785 Hyperlipidemia, unspecified: Secondary | ICD-10-CM | POA: Diagnosis not present

## 2015-10-16 DIAGNOSIS — Z79891 Long term (current) use of opiate analgesic: Secondary | ICD-10-CM | POA: Diagnosis not present

## 2015-10-16 DIAGNOSIS — I872 Venous insufficiency (chronic) (peripheral): Secondary | ICD-10-CM | POA: Diagnosis not present

## 2015-10-16 DIAGNOSIS — I10 Essential (primary) hypertension: Secondary | ICD-10-CM | POA: Diagnosis not present

## 2015-10-16 DIAGNOSIS — M069 Rheumatoid arthritis, unspecified: Secondary | ICD-10-CM | POA: Diagnosis not present

## 2015-10-16 DIAGNOSIS — L989 Disorder of the skin and subcutaneous tissue, unspecified: Secondary | ICD-10-CM | POA: Diagnosis not present

## 2015-10-16 DIAGNOSIS — Z4800 Encounter for change or removal of nonsurgical wound dressing: Secondary | ICD-10-CM | POA: Diagnosis not present

## 2015-10-16 DIAGNOSIS — I2510 Atherosclerotic heart disease of native coronary artery without angina pectoris: Secondary | ICD-10-CM | POA: Diagnosis not present

## 2015-10-16 DIAGNOSIS — M109 Gout, unspecified: Secondary | ICD-10-CM | POA: Diagnosis not present

## 2015-10-16 DIAGNOSIS — L03116 Cellulitis of left lower limb: Secondary | ICD-10-CM | POA: Diagnosis not present

## 2015-10-16 DIAGNOSIS — Z7901 Long term (current) use of anticoagulants: Secondary | ICD-10-CM | POA: Diagnosis not present

## 2015-10-16 DIAGNOSIS — E119 Type 2 diabetes mellitus without complications: Secondary | ICD-10-CM | POA: Diagnosis not present

## 2015-10-16 DIAGNOSIS — L97929 Non-pressure chronic ulcer of unspecified part of left lower leg with unspecified severity: Secondary | ICD-10-CM | POA: Diagnosis not present

## 2015-10-17 ENCOUNTER — Other Ambulatory Visit: Payer: Self-pay | Admitting: Physician Assistant

## 2015-10-17 ENCOUNTER — Encounter: Payer: Self-pay | Admitting: Physician Assistant

## 2015-10-17 MED ORDER — SIMVASTATIN 20 MG PO TABS: 20.0000 mg | ORAL_TABLET | Freq: Every day | ORAL | 5 refills | Status: DC

## 2015-10-17 NOTE — Progress Notes (Signed)
Subjective:    Patient ID: Katherine Cervantes, female    DOB: 1929/10/11, 82 y.o.   MRN: [id number]  HPI Patient is a 82 year old female who presents to the clinic to reestablish care with myself. She is a Dr. Kary Kos patient and reestablishing with myself due to him leaving the practice. Today she would like to go over her problem list and get to know anymore.  .. Active Ambulatory Problems    Diagnosis Date Noted  . Dyslipidemia 09/27/2011  . Hypertension 09/27/2011  . CAD (coronary artery disease) 09/27/2011  . Atrial fibrillation (HCC) 09/27/2011  . Sleep apnea 09/27/2011  . History of gout 09/27/2011  . Pacemaker 09/27/2011  . Rheumatoid arthritis(714.0) 09/27/2011  . Osteoporosis 09/27/2011  . Shingles outbreak 10/01/2011  . Nephrolithiasis - 0.3cm Right Ureter August 2013 10/07/2011  . History of (heterozygous) Hemochromatosis 10/10/2011  . Diverticulosis 11/04/2011  . Type 2 diabetes mellitus (HCC) 11/30/2012  . Hyperlipidemia 01/13/2013  . Neck mass 02/08/2013  . Vasculitis (HCC) 04/12/2014  . Cerebrovascular disease 08/24/2014  . Blood in stool 10/15/2015  . Encopresis 10/15/2015  . Incontinence 10/15/2015   Resolved Ambulatory Problems    Diagnosis Date Noted  . No Resolved Ambulatory Problems   Past Medical History:  Diagnosis Date  . Asthma   . Atrial fibrillation (HCC) 09/27/2011  . CAD (coronary artery disease) 09/27/2011  . Diabetes mellitus without complication (HCC)   . Dyslipidemia 09/27/2011  . History of vasculitis 09/27/2011  . Hypertension 09/27/2011  . Osteoporosis 09/27/2011  . Pacemaker   . PUD (peptic ulcer disease)   . Rheumatoid arthritis(714.0) 09/27/2011   .Marland Kitchen Family History  Problem Relation Age of Onset  . Hypertension Mother   . Cancer Brother   . Cancer Brother    .Marland Kitchen Social History   Social History  . Marital status: Widowed    Spouse name: N/A  . Number of children: 7  . Years of education: N/A   Occupational History  .  Not on file.   Social History Main Topics  . Smoking status: Never Smoker  . Smokeless tobacco: Never Used  . Alcohol use 0.0 oz/week     Comment: Occasional  . Drug use: No  . Sexual activity: Not on file   Other Topics Concern  . Not on file   Social History Narrative  . No narrative on file   She does mention an episode of losing control of her bladder and bowels. This is only happened once. She also describes her stools appeared to be more dark in color. She wonders if this could be blood. She denies any abdominal pain or rectal pain today. These issues have not occurred in over 3 weeks.  She does not need any medication refills today.   Review of Systems    see HPI.  Objective:   Physical Exam  Constitutional: She is oriented to person, place, and time. She appears well-developed and well-nourished.  Morbidly obese.   HENT:  Head: Normocephalic and atraumatic.  Cardiovascular: Normal rate, regular rhythm and normal heart sounds.   Pulmonary/Chest: Effort normal and breath sounds normal.  Neurological: She is alert and oriented to person, place, and time.  Psychiatric: She has a normal mood and affect. Her behavior is normal.          Assessment & Plan:  Hyperlipidemia- will order to check due to DM goal lower. Pt is not on statin.   Dark stools-  given stool cards to collect and look  for blood.   Encopresis/inconinence- only happened once. Discussed if happened again more work up would be needed.

## 2015-10-19 ENCOUNTER — Other Ambulatory Visit (INDEPENDENT_AMBULATORY_CARE_PROVIDER_SITE_OTHER): Payer: Medicare Other

## 2015-10-19 DIAGNOSIS — R195 Other fecal abnormalities: Secondary | ICD-10-CM

## 2015-10-19 LAB — POC HEMOCCULT BLD/STL (HOME/3-CARD/SCREEN)
Card #2 Fecal Occult Blod, POC: NEGATIVE
Card #3 Fecal Occult Blood, POC: NEGATIVE
Fecal Occult Blood, POC: NEGATIVE

## 2015-10-20 DIAGNOSIS — L989 Disorder of the skin and subcutaneous tissue, unspecified: Secondary | ICD-10-CM | POA: Diagnosis not present

## 2015-10-20 DIAGNOSIS — Z79891 Long term (current) use of opiate analgesic: Secondary | ICD-10-CM | POA: Diagnosis not present

## 2015-10-20 DIAGNOSIS — L97929 Non-pressure chronic ulcer of unspecified part of left lower leg with unspecified severity: Secondary | ICD-10-CM | POA: Diagnosis not present

## 2015-10-20 DIAGNOSIS — I482 Chronic atrial fibrillation: Secondary | ICD-10-CM | POA: Diagnosis not present

## 2015-10-20 DIAGNOSIS — I2510 Atherosclerotic heart disease of native coronary artery without angina pectoris: Secondary | ICD-10-CM | POA: Diagnosis not present

## 2015-10-20 DIAGNOSIS — D849 Immunodeficiency, unspecified: Secondary | ICD-10-CM | POA: Diagnosis not present

## 2015-10-20 DIAGNOSIS — I10 Essential (primary) hypertension: Secondary | ICD-10-CM | POA: Diagnosis not present

## 2015-10-20 DIAGNOSIS — M069 Rheumatoid arthritis, unspecified: Secondary | ICD-10-CM | POA: Diagnosis not present

## 2015-10-20 DIAGNOSIS — E785 Hyperlipidemia, unspecified: Secondary | ICD-10-CM | POA: Diagnosis not present

## 2015-10-20 DIAGNOSIS — L03116 Cellulitis of left lower limb: Secondary | ICD-10-CM | POA: Diagnosis not present

## 2015-10-20 DIAGNOSIS — M109 Gout, unspecified: Secondary | ICD-10-CM | POA: Diagnosis not present

## 2015-10-20 DIAGNOSIS — Z792 Long term (current) use of antibiotics: Secondary | ICD-10-CM | POA: Diagnosis not present

## 2015-10-20 DIAGNOSIS — Z950 Presence of cardiac pacemaker: Secondary | ICD-10-CM | POA: Diagnosis not present

## 2015-10-20 DIAGNOSIS — Z7901 Long term (current) use of anticoagulants: Secondary | ICD-10-CM | POA: Diagnosis not present

## 2015-10-20 DIAGNOSIS — I872 Venous insufficiency (chronic) (peripheral): Secondary | ICD-10-CM | POA: Diagnosis not present

## 2015-10-20 DIAGNOSIS — Z4800 Encounter for change or removal of nonsurgical wound dressing: Secondary | ICD-10-CM | POA: Diagnosis not present

## 2015-10-20 DIAGNOSIS — E119 Type 2 diabetes mellitus without complications: Secondary | ICD-10-CM | POA: Diagnosis not present

## 2015-10-23 DIAGNOSIS — Z7901 Long term (current) use of anticoagulants: Secondary | ICD-10-CM | POA: Diagnosis not present

## 2015-10-23 DIAGNOSIS — I872 Venous insufficiency (chronic) (peripheral): Secondary | ICD-10-CM | POA: Diagnosis not present

## 2015-10-23 DIAGNOSIS — L989 Disorder of the skin and subcutaneous tissue, unspecified: Secondary | ICD-10-CM | POA: Diagnosis not present

## 2015-10-23 DIAGNOSIS — M109 Gout, unspecified: Secondary | ICD-10-CM | POA: Diagnosis not present

## 2015-10-23 DIAGNOSIS — L97929 Non-pressure chronic ulcer of unspecified part of left lower leg with unspecified severity: Secondary | ICD-10-CM | POA: Diagnosis not present

## 2015-10-23 DIAGNOSIS — M069 Rheumatoid arthritis, unspecified: Secondary | ICD-10-CM | POA: Diagnosis not present

## 2015-10-23 DIAGNOSIS — I2510 Atherosclerotic heart disease of native coronary artery without angina pectoris: Secondary | ICD-10-CM | POA: Diagnosis not present

## 2015-10-23 DIAGNOSIS — Z79891 Long term (current) use of opiate analgesic: Secondary | ICD-10-CM | POA: Diagnosis not present

## 2015-10-23 DIAGNOSIS — E119 Type 2 diabetes mellitus without complications: Secondary | ICD-10-CM | POA: Diagnosis not present

## 2015-10-23 DIAGNOSIS — L03116 Cellulitis of left lower limb: Secondary | ICD-10-CM | POA: Diagnosis not present

## 2015-10-23 DIAGNOSIS — Z792 Long term (current) use of antibiotics: Secondary | ICD-10-CM | POA: Diagnosis not present

## 2015-10-23 DIAGNOSIS — I10 Essential (primary) hypertension: Secondary | ICD-10-CM | POA: Diagnosis not present

## 2015-10-23 DIAGNOSIS — Z4800 Encounter for change or removal of nonsurgical wound dressing: Secondary | ICD-10-CM | POA: Diagnosis not present

## 2015-10-23 DIAGNOSIS — I482 Chronic atrial fibrillation: Secondary | ICD-10-CM | POA: Diagnosis not present

## 2015-10-23 DIAGNOSIS — D849 Immunodeficiency, unspecified: Secondary | ICD-10-CM | POA: Diagnosis not present

## 2015-10-23 DIAGNOSIS — E785 Hyperlipidemia, unspecified: Secondary | ICD-10-CM | POA: Diagnosis not present

## 2015-10-23 DIAGNOSIS — Z950 Presence of cardiac pacemaker: Secondary | ICD-10-CM | POA: Diagnosis not present

## 2015-10-27 DIAGNOSIS — L97929 Non-pressure chronic ulcer of unspecified part of left lower leg with unspecified severity: Secondary | ICD-10-CM | POA: Diagnosis not present

## 2015-10-27 DIAGNOSIS — D849 Immunodeficiency, unspecified: Secondary | ICD-10-CM | POA: Diagnosis not present

## 2015-10-27 DIAGNOSIS — Z4800 Encounter for change or removal of nonsurgical wound dressing: Secondary | ICD-10-CM | POA: Diagnosis not present

## 2015-10-27 DIAGNOSIS — E785 Hyperlipidemia, unspecified: Secondary | ICD-10-CM | POA: Diagnosis not present

## 2015-10-27 DIAGNOSIS — Z7901 Long term (current) use of anticoagulants: Secondary | ICD-10-CM | POA: Diagnosis not present

## 2015-10-27 DIAGNOSIS — Z792 Long term (current) use of antibiotics: Secondary | ICD-10-CM | POA: Diagnosis not present

## 2015-10-27 DIAGNOSIS — M069 Rheumatoid arthritis, unspecified: Secondary | ICD-10-CM | POA: Diagnosis not present

## 2015-10-27 DIAGNOSIS — E119 Type 2 diabetes mellitus without complications: Secondary | ICD-10-CM | POA: Diagnosis not present

## 2015-10-27 DIAGNOSIS — M109 Gout, unspecified: Secondary | ICD-10-CM | POA: Diagnosis not present

## 2015-10-27 DIAGNOSIS — L03116 Cellulitis of left lower limb: Secondary | ICD-10-CM | POA: Diagnosis not present

## 2015-10-27 DIAGNOSIS — I482 Chronic atrial fibrillation: Secondary | ICD-10-CM | POA: Diagnosis not present

## 2015-10-27 DIAGNOSIS — L989 Disorder of the skin and subcutaneous tissue, unspecified: Secondary | ICD-10-CM | POA: Diagnosis not present

## 2015-10-27 DIAGNOSIS — I2510 Atherosclerotic heart disease of native coronary artery without angina pectoris: Secondary | ICD-10-CM | POA: Diagnosis not present

## 2015-10-27 DIAGNOSIS — Z950 Presence of cardiac pacemaker: Secondary | ICD-10-CM | POA: Diagnosis not present

## 2015-10-27 DIAGNOSIS — I10 Essential (primary) hypertension: Secondary | ICD-10-CM | POA: Diagnosis not present

## 2015-10-27 DIAGNOSIS — I872 Venous insufficiency (chronic) (peripheral): Secondary | ICD-10-CM | POA: Diagnosis not present

## 2015-10-27 DIAGNOSIS — Z79891 Long term (current) use of opiate analgesic: Secondary | ICD-10-CM | POA: Diagnosis not present

## 2015-11-09 ENCOUNTER — Other Ambulatory Visit: Payer: Self-pay | Admitting: Family Medicine

## 2015-11-17 ENCOUNTER — Encounter: Payer: Self-pay | Admitting: Internal Medicine

## 2015-11-29 ENCOUNTER — Other Ambulatory Visit: Payer: Self-pay | Admitting: Physician Assistant

## 2015-12-15 ENCOUNTER — Other Ambulatory Visit: Payer: Self-pay | Admitting: Physician Assistant

## 2015-12-15 DIAGNOSIS — I480 Paroxysmal atrial fibrillation: Secondary | ICD-10-CM

## 2015-12-19 ENCOUNTER — Ambulatory Visit: Payer: Medicare Other | Admitting: Physician Assistant

## 2016-02-07 ENCOUNTER — Other Ambulatory Visit: Payer: Self-pay | Admitting: Pharmacist

## 2016-02-07 NOTE — Patient Outreach (Signed)
Outreach call to Katherine Cervantes regarding medication adherence to simvastatin. Called and spoke with patient. HIPAA identifiers verified and verbal consent received.  Katherine Cervantes reports that she has been taking her simvastatin as directed. Denies any issues with medication side effects or cost. Reports that she believes that she missed a couple of doses when traveling for the holidays and then also when she was in the hospital. Patient looks at her bottles and reports that she has plenty of medication at this time. Discuss with patient the importance of medication adherence. Katherine Cervantes reports that she has no further medication questions/concerns at this time.   Duanne Moron, PharmD, [facility] Clinical Pharmacist [facility] Care Management [phone]

## 2016-04-16 ENCOUNTER — Encounter: Payer: Self-pay | Admitting: Physician Assistant

## 2016-04-16 ENCOUNTER — Ambulatory Visit (INDEPENDENT_AMBULATORY_CARE_PROVIDER_SITE_OTHER): Payer: Medicare Other | Admitting: Physician Assistant

## 2016-04-16 VITALS — BP 177/79 | HR 82 | Ht 61.0 in | Wt 238.0 lb

## 2016-04-16 DIAGNOSIS — Z950 Presence of cardiac pacemaker: Secondary | ICD-10-CM

## 2016-04-16 DIAGNOSIS — I2583 Coronary atherosclerosis due to lipid rich plaque: Secondary | ICD-10-CM

## 2016-04-16 DIAGNOSIS — I10 Essential (primary) hypertension: Secondary | ICD-10-CM | POA: Diagnosis not present

## 2016-04-16 DIAGNOSIS — I482 Chronic atrial fibrillation, unspecified: Secondary | ICD-10-CM

## 2016-04-16 DIAGNOSIS — I2510 Atherosclerotic heart disease of native coronary artery without angina pectoris: Secondary | ICD-10-CM | POA: Diagnosis not present

## 2016-04-16 DIAGNOSIS — I480 Paroxysmal atrial fibrillation: Secondary | ICD-10-CM | POA: Diagnosis not present

## 2016-04-16 MED ORDER — LISINOPRIL 10 MG PO TABS: 10.0000 mg | ORAL_TABLET | Freq: Every day | ORAL | 1 refills | Status: DC

## 2016-04-16 MED ORDER — WARFARIN SODIUM 3 MG PO TABS: 3.0000 mg | ORAL_TABLET | Freq: Every day | ORAL | 1 refills | Status: DC

## 2016-04-16 MED ORDER — METOPROLOL SUCCINATE ER 100 MG PO TB24: 100.0000 mg | ORAL_TABLET | Freq: Every day | ORAL | 1 refills | Status: DC

## 2016-04-16 MED ORDER — FUROSEMIDE 40 MG PO TABS: ORAL_TABLET | ORAL | 2 refills | Status: DC

## 2016-04-16 MED ORDER — SIMVASTATIN 20 MG PO TABS: 20.0000 mg | ORAL_TABLET | Freq: Every day | ORAL | 1 refills | Status: DC

## 2016-04-16 NOTE — Progress Notes (Signed)
Subjective:    Patient ID: Katherine Cervantes, female    DOB: April 15, 1929, 83 y.o.   MRN: [id number]  HPI  Pt is a 83 yo female who is non-complaint and presents to the clinic for medication follow up.   .. Active Ambulatory Problems    Diagnosis Date Noted  . Dyslipidemia 09/27/2011  . Hypertension 09/27/2011  . CAD (coronary artery disease) 09/27/2011  . Atrial fibrillation (HCC) 09/27/2011  . Sleep apnea 09/27/2011  . History of gout 09/27/2011  . Pacemaker 09/27/2011  . Rheumatoid arthritis(714.0) 09/27/2011  . Osteoporosis 09/27/2011  . Shingles outbreak 10/01/2011  . Nephrolithiasis - 0.3cm Right Ureter August 2013 10/07/2011  . History of (heterozygous) Hemochromatosis 10/10/2011  . Diverticulosis 11/04/2011  . Type 2 diabetes mellitus (HCC) 11/30/2012  . Hyperlipidemia 01/13/2013  . Neck mass 02/08/2013  . Vasculitis (HCC) 04/12/2014  . Cerebrovascular disease 08/24/2014  . Blood in stool 10/15/2015  . Encopresis 10/15/2015  . Incontinence 10/15/2015   Resolved Ambulatory Problems    Diagnosis Date Noted  . No Resolved Ambulatory Problems   Past Medical History:  Diagnosis Date  . Asthma   . Atrial fibrillation (HCC) 09/27/2011  . CAD (coronary artery disease) 09/27/2011  . Diabetes mellitus without complication (HCC)   . Dyslipidemia 09/27/2011  . History of vasculitis 09/27/2011  . Hypertension 09/27/2011  . Osteoporosis 09/27/2011  . Pacemaker   . PUD (peptic ulcer disease)   . Rheumatoid arthritis(714.0) 09/27/2011     Pt has paroxysmal afib with pacemaker and supposed to see Dr. Ladona Ridgel her cardiologist. She has not seen him. She is not currently on any anticoagulation.    She has no concerns.      Review of Systems  All other systems reviewed and are negative.      Objective:   Physical Exam  Constitutional: She is oriented to person, place, and time. She appears well-developed and well-nourished.  Morbidly obese.   HENT:  Head:  Normocephalic and atraumatic.  Cardiovascular: Normal rate, regular rhythm and normal heart sounds.   Pulmonary/Chest: Effort normal and breath sounds normal. She has no wheezes.  Neurological: She is alert and oriented to person, place, and time.  Psychiatric: She has a normal mood and affect. Her behavior is normal.          Assessment & Plan:  Marland KitchenMarland KitchenDiagnoses and all orders for this visit:  Essential hypertension -     metoprolol succinate (TOPROL-XL) 100 MG 24 hr tablet; Take 1 tablet (100 mg total) by mouth daily. Take with or immediately following a meal. -     lisinopril (PRINIVIL,ZESTRIL) 10 MG tablet; Take 1 tablet (10 mg total) by mouth daily.  Paroxysmal atrial fibrillation (HCC) -     furosemide (LASIX) 40 MG tablet; TAKE 1-1&1/2 TABLETS BY MOUTH 2 TIMES A DAY TO HELP WITH SWELLING -     warfarin (COUMADIN) 3 MG tablet; Take 1 tablet (3 mg total) by mouth daily.  Chronic atrial fibrillation (HCC) -     metoprolol succinate (TOPROL-XL) 100 MG 24 hr tablet; Take 1 tablet (100 mg total) by mouth daily. Take with or immediately following a meal.  Pacemaker -     metoprolol succinate (TOPROL-XL) 100 MG 24 hr tablet; Take 1 tablet (100 mg total) by mouth daily. Take with or immediately following a meal.  Coronary artery disease due to lipid rich plaque -     metoprolol succinate (TOPROL-XL) 100 MG 24 hr tablet; Take 1 tablet (100 mg total)  by mouth daily. Take with or immediately following a meal. -     simvastatin (ZOCOR) 20 MG tablet; Take 1 tablet (20 mg total) by mouth at bedtime.   Strongly encouraged to restart anticoagulation. Ordered coumadin 3mg . Recheck in 2 weeks.  BP elevated continue metoprolol and added lisinopril. Recheck in 2 weeks.   Will get labs once on medications again.   Discussed risk of not properly treating chronic diseases.

## 2016-04-16 NOTE — Patient Instructions (Signed)
Check Blood pressure daily bring log. Goal BP under 140/90.  Added lisinopril 10mg  daily.  Start coumadin 3mg  daily. Recheck INR recheck in 2 weeks.

## 2016-04-30 ENCOUNTER — Ambulatory Visit: Payer: Medicare Other | Admitting: Physician Assistant

## 2016-05-13 ENCOUNTER — Telehealth: Payer: Self-pay | Admitting: Emergency Medicine

## 2016-05-13 NOTE — Telephone Encounter (Signed)
Call daughter back. I do need patient to be compliant with follow up appointments. What are her BP readings? Is she having any chest pain? What is her heart rate? I am ok with increasing lisinopril to 20mg  daily and recheck in 2 weeks. Ok to send new dose.

## 2016-05-13 NOTE — Telephone Encounter (Signed)
Patient's daughter, Katherine Cervantes, called to report patient's BP remaining higher than normal and wonders if this needs to be changed; also states patient somewhat agitated. She did not keep her 3/20 appointment for check up and labs. Please respond to daughter at [phone].

## 2016-05-15 ENCOUNTER — Ambulatory Visit (INDEPENDENT_AMBULATORY_CARE_PROVIDER_SITE_OTHER): Payer: Medicare Other | Admitting: Osteopathic Medicine

## 2016-05-15 ENCOUNTER — Encounter: Payer: Self-pay | Admitting: Osteopathic Medicine

## 2016-05-15 ENCOUNTER — Telehealth: Payer: Self-pay

## 2016-05-15 VITALS — BP 135/85 | HR 76 | Wt 235.0 lb

## 2016-05-15 DIAGNOSIS — N3001 Acute cystitis with hematuria: Secondary | ICD-10-CM | POA: Diagnosis not present

## 2016-05-15 DIAGNOSIS — R300 Dysuria: Secondary | ICD-10-CM | POA: Diagnosis not present

## 2016-05-15 DIAGNOSIS — I10 Essential (primary) hypertension: Secondary | ICD-10-CM

## 2016-05-15 DIAGNOSIS — M549 Dorsalgia, unspecified: Secondary | ICD-10-CM

## 2016-05-15 LAB — POCT URINALYSIS DIPSTICK
Bilirubin, UA: NEGATIVE
Glucose, UA: NEGATIVE
Ketones, UA: NEGATIVE
NITRITE UA: POSITIVE
PH UA: 5.5 (ref 5.0–8.0)
PROTEIN UA: 30
Spec Grav, UA: 1.02 (ref 1.030–1.035)
UROBILINOGEN UA: 0.2 (ref ?–2.0)

## 2016-05-15 MED ORDER — NITROFURANTOIN MONOHYD MACRO 100 MG PO CAPS: 100.0000 mg | ORAL_CAPSULE | Freq: Two times a day (BID) | ORAL | 0 refills | Status: DC

## 2016-05-15 NOTE — Telephone Encounter (Signed)
Daughter called, Katherine Cervantes's BP this am is 201/108, Started on a new medication last week, and is concerned that it continues to rise.  Pt is also having signs of confusion.  Pt is not having chest pain, and her pulse is 76.  Spoke with Tandy Gaw, PA-C, and scheduled appointment with Dr. Lyn Hollingshead this afternoon.

## 2016-05-15 NOTE — Progress Notes (Signed)
HPI: Katherine Cervantes is a 83 y.o. female  who presents to [facility] Katherine Cervantes today, 05/15/16,  for chief complaint of:  Chief Complaint  Patient presents with  . Other    BLOOD PRESSURE CONERNS AND POSSIBLE UTI    HTN:   See phone note 05/13/16 - missed follow-up for BP. Katherine Cervantes advised could go up to Lisinopril 20 mg and recheck in 2 weeks.   Patient's daughter made today's appointment. Blood pressures on home cuff reading a bit high there is some confusion about the medications. Patient is not sure whether she is taking the lisinopril, cannot confirm the metoprolol dose. On record review, looks like lisinopril was started at last visit and metoprolol was increased  Home blood pressure monitor verified today in the office, 140/72 on home monitor versus 135/85 with manual recheck by myself.  UTI:  Urinary frequency and low back pain, no nausea or dysuria. Patient is a bit unclear about onset of her symptoms. Daughter has not noted any increased confusion.    140/72 va 135/85 Past medical history, surgical history, social history and family history reviewed.  Patient Active Problem List   Diagnosis Date Noted  . Blood in stool 10/15/2015  . Encopresis 10/15/2015  . Incontinence 10/15/2015  . Cerebrovascular disease 08/24/2014  . Vasculitis (HCC) 04/12/2014  . Neck mass 02/08/2013  . Hyperlipidemia 01/13/2013  . Type 2 diabetes mellitus (HCC) 11/30/2012  . Diverticulosis 11/04/2011  . History of (heterozygous) Hemochromatosis 10/10/2011  . Nephrolithiasis - 0.3cm Right Ureter August 2013 10/07/2011  . Shingles outbreak 10/01/2011  . Dyslipidemia 09/27/2011  . Hypertension 09/27/2011  . CAD (coronary artery disease) 09/27/2011  . Atrial fibrillation (HCC) 09/27/2011  . Sleep apnea 09/27/2011  . History of gout 09/27/2011  . Pacemaker 09/27/2011  . Rheumatoid arthritis(714.0) 09/27/2011  . Osteoporosis 09/27/2011    Current medication list  and allergy/intolerance information reviewed.   Current Outpatient Prescriptions on File Prior to Visit  Medication Sig Dispense Refill  . furosemide (LASIX) 40 MG tablet TAKE 1-1&1/2 TABLETS BY MOUTH 2 TIMES A DAY TO HELP WITH SWELLING 180 tablet 2  . lisinopril (PRINIVIL,ZESTRIL) 10 MG tablet Take 1 tablet (10 mg total) by mouth daily. 30 tablet 1  . metoprolol succinate (TOPROL-XL) 100 MG 24 hr tablet Take 1 tablet (100 mg total) by mouth daily. Take with or immediately following a meal. 90 tablet 1  . nitroGLYCERIN (NITROSTAT) 0.4 MG SL tablet Place 0.4 mg under the tongue every 5 (five) minutes as needed for chest pain.    . simvastatin (ZOCOR) 20 MG tablet Take 1 tablet (20 mg total) by mouth at bedtime. 90 tablet 1  . traMADol-acetaminophen (ULTRACET) 37.5-325 MG tablet TAKE ONE TABLET BY MOUTH EVERY 8 HOURS AS NEEDED FOR ARTHRITIS PAIN 45 tablet 0  . warfarin (COUMADIN) 3 MG tablet Take 1 tablet (3 mg total) by mouth daily. 45 tablet 1   No current facility-administered medications on file prior to visit.    Allergies  Allergen Reactions  . Aspirin Anaphylaxis  . Codeine Other (See Comments)  . Morphine And Related Other (See Comments)  . Other Other (See Comments)  . Penicillins Other (See Comments)  . Sulfa Antibiotics Other (See Comments)  . Ether   . Levaquin [Levofloxacin In D5w]   . Metoprolol     Possible  . Prednisone   . Procaine   . Toprol Xl  [Metoprolol Tartrate]     Possible  Review of Systems:  Constitutional: No recent illness  HEENT: No  headache, no vision change  Cardiac: No  chest pain, No  pressure, No palpitations  Respiratory:  No  shortness of breath. No  Cough  Gastrointestinal: No  abdominal pain  Neurologic: No  weakness, No  Dizziness   Exam:  BP (!) 159/84   Pulse 76   Wt 235 lb (106.6 kg)   BMI 44.40 kg/m   Constitutional: VS see above. General Appearance: alert, well-developed, well-nourished, NAD  Eyes: Normal lids and  conjunctive, non-icteric sclera  Ears, Nose, Mouth, Throat: MMM, Normal external inspection ears/nares/mouth/lips/gums.  Neck: No masses, trachea midline.   Respiratory: Normal respiratory effort. no wheeze, no rhonchi, no rales  Cardiovascular: S1/S2 normal, no murmur, no rub/gallop auscultated. RRR.   Musculoskeletal: Gait normal. Symmetric and independent movement of all extremities  Neurological: Normal balance/coordination. No tremor.  Skin: warm, dry, intact.   Psychiatric: Normal judgment/insight. Normal mood and affect. Oriented x3.    Recent Results (from the past 2160 hour(s))  POCT Urinalysis Dipstick     Status: Abnormal   Collection Time: 05/15/16  3:50 PM  Result Value Ref Range   Color, UA YELLOW    Clarity, UA CLEAR    Glucose, UA NEGATIVE    Bilirubin, UA NEGATIVE    Ketones, UA NEGATIVE    Spec Grav, UA 1.020 1.030 - 1.035   Blood, UA SMALL    pH, UA 5.5 5.0 - 8.0   Protein, UA 30    Urobilinogen, UA 0.2 Negative - 2.0   Nitrite, UA POSITIVE    Leukocytes, UA small (1+) (A) Negative    No results found.  No flowsheet data found.    ASSESSMENT/PLAN:   Essential hypertension - Systolic on home blood pressure cuff within 10 mmHg the diastolic is not. Advised recheck when high at home. Brings pill bottles to follow up appointment - Plan: BASIC METABOLIC PANEL WITH GFR  Acute left-sided back pain, unspecified back location - Plan: POCT Urinalysis Dipstick  Dysuria - Plan: Urine Culture  Acute cystitis with hematuria - Small leukocytes with positive nitrites and blood. Initiate antibiotics and await culture - Plan: nitrofurantoin, macrocrystal-monohydrate, (MACROBID) 100 MG capsule    Follow-up plan: Return in about 2 weeks (around 05/29/2016) for RECHECK BLOOD PRESSURE WITH JADE - bring pill bottles .  Visit summary with medication list and pertinent instructions was printed for patient to review, alert [place] if any changes needed. All questions at  time of visit were answered - patient instructed to contact office with any additional concerns. ER/RTC precautions were reviewed with the patient and understanding verbalized.

## 2016-05-16 LAB — BASIC METABOLIC PANEL WITH GFR
BUN: 26 mg/dL — AB (ref 7–25)
CALCIUM: 9.6 mg/dL (ref 8.6–10.4)
CO2: 25 mmol/L (ref 20–31)
CREATININE: 1.05 mg/dL — AB (ref 0.60–0.88)
Chloride: 105 mmol/L (ref 98–110)
GFR, Est African American: 56 mL/min — ABNORMAL LOW (ref >=60)
GFR, Est Non African American: 49 mL/min — ABNORMAL LOW (ref >=60)
Glucose, Bld: 112 mg/dL — ABNORMAL HIGH (ref 65–99)
Potassium: 3.7 mmol/L (ref 3.5–5.3)
Sodium: 141 mmol/L (ref 135–146)

## 2016-05-18 LAB — URINE CULTURE

## 2016-05-21 ENCOUNTER — Ambulatory Visit: Payer: Medicare Other | Admitting: Physician Assistant

## 2016-05-21 ENCOUNTER — Other Ambulatory Visit: Payer: Self-pay

## 2016-05-21 ENCOUNTER — Telehealth: Payer: Self-pay

## 2016-05-21 DIAGNOSIS — M549 Dorsalgia, unspecified: Secondary | ICD-10-CM

## 2016-05-21 NOTE — Telephone Encounter (Signed)
Pt stated that she is less confused, and able to take care of herself.  She has more energy.  She feels that the antibiotic is helping as prior she really didn't care about eating and taking care of herself.  I updated the allergy list.  Some of the medication reactions were years ago.  She said that while she was confused for several days, she did not take doses of medication, so she has enough to get through until Saturday.  Please advise as to if you will change the antibiotic and if she needs to come back in for a urine.

## 2016-05-21 NOTE — Telephone Encounter (Signed)
Patient advised of recommendations and she will come back for a urine culture.

## 2016-05-21 NOTE — Telephone Encounter (Signed)
I would like her to come back for a nurse visit for urine culture collection to confirm resolution of the infection. Whoever is taking care of nurse visits today and tomorrow, please check with them if it is okay to add this patient to schedule.

## 2016-05-22 ENCOUNTER — Telehealth: Payer: Self-pay | Admitting: *Deleted

## 2016-05-22 DIAGNOSIS — N390 Urinary tract infection, site not specified: Secondary | ICD-10-CM

## 2016-05-22 DIAGNOSIS — M549 Dorsalgia, unspecified: Secondary | ICD-10-CM | POA: Diagnosis not present

## 2016-05-22 DIAGNOSIS — R319 Hematuria, unspecified: Principal | ICD-10-CM

## 2016-05-23 LAB — URINE CULTURE: ORGANISM ID, BACTERIA: NO GROWTH

## 2016-05-28 ENCOUNTER — Encounter: Payer: Self-pay | Admitting: Internal Medicine

## 2016-05-29 ENCOUNTER — Telehealth: Payer: Self-pay

## 2016-05-29 NOTE — Telephone Encounter (Signed)
Pt called and cancelled her appointment on 4-19.  Daughter called and wanted to know if you need to see her, Dorene Grebe asked her to follow up with you in 2 weeks.  If you need to see her, the daughter asked for the office to call and set up the appointment, as the patient will cancel if daughter sets it up.

## 2016-05-30 ENCOUNTER — Ambulatory Visit: Payer: Medicare Other | Admitting: Physician Assistant

## 2016-05-30 NOTE — Telephone Encounter (Signed)
Yes, I want to see that blood pressures are improving. Please call patient to see if will set up.

## 2016-05-30 NOTE — Telephone Encounter (Signed)
Pt is going to talk with her daughter tonight and call back tomorrow and schedule an appointment.

## 2016-06-13 ENCOUNTER — Encounter: Payer: Self-pay | Admitting: Internal Medicine

## 2016-06-13 ENCOUNTER — Ambulatory Visit (INDEPENDENT_AMBULATORY_CARE_PROVIDER_SITE_OTHER): Payer: Medicare Other | Admitting: Internal Medicine

## 2016-06-13 ENCOUNTER — Encounter (INDEPENDENT_AMBULATORY_CARE_PROVIDER_SITE_OTHER): Payer: Self-pay

## 2016-06-13 VITALS — BP 158/84 | HR 72 | Ht 60.0 in | Wt 236.6 lb

## 2016-06-13 DIAGNOSIS — I480 Paroxysmal atrial fibrillation: Secondary | ICD-10-CM

## 2016-06-13 DIAGNOSIS — Z950 Presence of cardiac pacemaker: Secondary | ICD-10-CM

## 2016-06-13 DIAGNOSIS — I10 Essential (primary) hypertension: Secondary | ICD-10-CM | POA: Diagnosis not present

## 2016-06-13 MED ORDER — NITROGLYCERIN 0.4 MG SL SUBL: 0.4000 mg | SUBLINGUAL_TABLET | SUBLINGUAL | 3 refills | Status: DC | PRN

## 2016-06-13 NOTE — Patient Instructions (Addendum)
Medication Instructions:  Your physician recommends that you continue on your current medications as directed. Please refer to the Current Medication list given to you today.   Labwork: None ordered  Testing/Procedures: None ordered  Follow-Up: Your physician wants you to follow-up in: device clinic in 6 months. You will receive a reminder letter in the mail two months in advance. If you don't receive a letter, please call our office to schedule the follow-up appointment.     Your physician wants you to follow-up in: 1 year with Dr. Ladona Ridgel. You will receive a reminder letter in the mail two months in advance. If you don't receive a letter, please call our office to schedule the follow-up appointment.   Any Other Special Instructions Will Be Listed Below (If Applicable).     If you need a refill on your cardiac medications before your next appointment, please call your pharmacy.

## 2016-06-13 NOTE — Progress Notes (Signed)
HPI Katherine Cervantes returns today for ongoing evaluation and management of her PPM and atrial fib. She is a pleasant 83 yo woman with a h/o symptomatic bradycardia due to sinus node dysfunction for nearly 10 years. She has had atrial fibrillation which is mostly asymptomatic and appears based on PM interogation to be paroxysmal. She has moved from [place] to [place] to be closer to family. She denies syncope. She had cellulitis in her leg and required IV anti-biotics. The patient c/o problems with arthritis and difficulty losing weight.  Allergies  Allergen Reactions  . Aspirin Anaphylaxis  . Other Anaphylaxis  . Penicillins Anaphylaxis    Anaphylaxis  . Codeine Other (See Comments)    SOB Asthmatic issues  . Morphine And Related Other (See Comments)    Anaphylaxis  . Sulfa Antibiotics Rash  . Ether   . Levaquin [Levofloxacin In D5w] Other (See Comments)    Disoriented, Hallucinations  . Metoprolol     Possible  . Prednisone Other (See Comments)    Lethargic, rash  . Procaine   . Toprol Xl  [Metoprolol Tartrate]     Possible     Current Outpatient Prescriptions  Medication Sig Dispense Refill  . furosemide (LASIX) 40 MG tablet TAKE 1-1&1/2 TABLETS BY MOUTH 2 TIMES A DAY TO HELP WITH SWELLING 180 tablet 2  . lisinopril (PRINIVIL,ZESTRIL) 10 MG tablet Take 1 tablet (10 mg total) by mouth daily. 30 tablet 1  . metoprolol succinate (TOPROL-XL) 100 MG 24 hr tablet Take 1 tablet (100 mg total) by mouth daily. Take with or immediately following a meal. 90 tablet 1  . nitroGLYCERIN (NITROSTAT) 0.4 MG SL tablet Place 1 tablet (0.4 mg total) under the tongue every 5 (five) minutes as needed for chest pain (MAX 3 TABLETS). 25 tablet 3  . simvastatin (ZOCOR) 20 MG tablet Take 1 tablet (20 mg total) by mouth at bedtime. 90 tablet 1  . traMADol-acetaminophen (ULTRACET) 37.5-325 MG tablet TAKE ONE TABLET BY MOUTH EVERY 8 HOURS AS NEEDED FOR ARTHRITIS PAIN 45 tablet 0  . warfarin  (COUMADIN) 3 MG tablet Take 1 tablet (3 mg total) by mouth daily. 45 tablet 1   No current facility-administered medications for this visit.      Past Medical History:  Diagnosis Date  . Asthma   . Atrial fibrillation (HCC) 09/27/2011  . CAD (coronary artery disease) 09/27/2011  . Diabetes mellitus without complication (HCC)   . Dyslipidemia 09/27/2011  . History of vasculitis 09/27/2011  . Hypertension 09/27/2011  . Osteoporosis 09/27/2011  . Pacemaker   . PUD (peptic ulcer disease)   . Rheumatoid arthritis(714.0) 09/27/2011    ROS:   All systems reviewed and negative except as noted in the HPI.   Past Surgical History:  Procedure Laterality Date  . ABDOMINAL HYSTERECTOMY    . ANKLE SURGERY    . APPENDECTOMY    . Carpal tunnel    . CHOLECYSTECTOMY    . PARTIAL GASTRECTOMY       Family History  Problem Relation Age of Onset  . Hypertension Mother   . Cancer Brother   . Cancer Brother      Social History   Social History  . Marital status: Widowed    Spouse name: N/A  . Number of children: 7  . Years of education: N/A   Occupational History  . Not on file.   Social History Main Topics  . Smoking status: Never Smoker  . Smokeless tobacco: Never  Used  . Alcohol use 0.0 oz/week     Comment: Occasional  . Drug use: No  . Sexual activity: Not on file   Other Topics Concern  . Not on file   Social History Narrative  . No narrative on file     BP (!) 158/84   Pulse 72   Ht 5' (1.524 m)   Wt 236 lb 9.6 oz (107.3 kg)   SpO2 93%   BMI 46.21 kg/m   Physical Exam:  Well appearing elderly woman, obese, NAD HEENT: Unremarkable Neck:  6 cm JVD, no thyromegally Back:  No CVA tenderness Lungs:  Clear with no wheezes HEART:  Regular rate rhythm, no murmurs, no rubs, no clicks Abd:  soft, obese, positive bowel sounds, no organomegally, no rebound, no guarding Ext:  2 plus pulses, no edema, no cyanosis, no clubbing Skin:  No rashes no nodules Neuro:  CN  II through XII intact, motor grossly intact  ECG - nsr with atrial pacing and RBBB/fusion   DEVICE  Normal device function.  See PaceArt for details. Underlying rhythm today is sinus  Assess/Plan: 1. Complete heart block - she is conducting today but with a long AV delay. She paces over 65% of the time despite her long AV delay. Will tighten up the AV delay to a more physiologic interval. 2. Morbid obesity - she is encouraged to lose weight. 3. PPM - her [employer] device is working normally. Will recheck in several months.  4. PAF - she will continue warfarin. Her rate is controlled.  Leonia Reeves.D.

## 2016-06-14 LAB — CUP PACEART INCLINIC DEVICE CHECK
Battery Voltage: 2.73 V
Implantable Lead Implant Date: 20090521
Implantable Lead Location: 753860
Implantable Pulse Generator Implant Date: 20090521
Lead Channel Impedance Value: 237 Ohm
Lead Channel Impedance Value: 331 Ohm
Lead Channel Pacing Threshold Amplitude: 0.875 V
Lead Channel Pacing Threshold Pulse Width: 0.6 ms
Lead Channel Setting Pacing Amplitude: 2 V
Lead Channel Setting Pacing Pulse Width: 0.6 ms
Lead Channel Setting Sensing Sensitivity: 2.5 mV
MDC IDC LEAD IMPLANT DT: 20090521
MDC IDC LEAD LOCATION: 753859
MDC IDC MSMT BATTERY IMPEDANCE: 5300 Ohm
MDC IDC MSMT LEADCHNL RA PACING THRESHOLD AMPLITUDE: 0.5 V
MDC IDC MSMT LEADCHNL RA PACING THRESHOLD PULSEWIDTH: 0.6 ms
MDC IDC MSMT LEADCHNL RA SENSING INTR AMPL: 1.9 mV
MDC IDC PG SERIAL: 1227916
MDC IDC SESS DTM: 20180503162140
Pulse Gen Model: 5816

## 2016-06-27 ENCOUNTER — Other Ambulatory Visit: Payer: Self-pay | Admitting: Physician Assistant

## 2016-06-27 DIAGNOSIS — I10 Essential (primary) hypertension: Secondary | ICD-10-CM

## 2016-07-04 ENCOUNTER — Ambulatory Visit (INDEPENDENT_AMBULATORY_CARE_PROVIDER_SITE_OTHER): Payer: Medicare Other | Admitting: Osteopathic Medicine

## 2016-07-04 ENCOUNTER — Encounter: Payer: Self-pay | Admitting: Osteopathic Medicine

## 2016-07-04 VITALS — BP 142/70 | HR 78 | Temp 97.5°F | Wt 244.0 lb

## 2016-07-04 DIAGNOSIS — M545 Low back pain, unspecified: Secondary | ICD-10-CM

## 2016-07-04 DIAGNOSIS — G8929 Other chronic pain: Secondary | ICD-10-CM

## 2016-07-04 DIAGNOSIS — Z7189 Other specified counseling: Secondary | ICD-10-CM

## 2016-07-04 LAB — POCT URINALYSIS DIPSTICK
Bilirubin, UA: NEGATIVE
Glucose, UA: NEGATIVE
Ketones, UA: NEGATIVE
Nitrite, UA: NEGATIVE
Spec Grav, UA: 1.015 (ref 1.010–1.025)
UROBILINOGEN UA: 0.2 U/dL
pH, UA: 6 (ref 5.0–8.0)

## 2016-07-04 NOTE — Progress Notes (Signed)
Chief Complaint: Possible UTI  History of Present Illness: Katherine Cervantes is a 83 y.o. female who presents to [name]  today with concerns for Chief Complaint  Patient presents with  . Back Pain    Onset: few weeks ago, getting a bit worse past few days Location: R lower back  Quality: soreness, cramping/tightness feeling Context: here because daughter asked she be evaluated for UTI, pt thinks might be "just back pain" hasn't taken Rx/OTC analgesic meds, worse with movement/twisting of the back   Previous Cx(+)UTI: 05/2016  Recurrent UTI (3 times/more annually): no    Past medical, social and family history reviewed: Past Medical History:  Diagnosis Date  . Asthma   . Atrial fibrillation (HCC) 09/27/2011  . CAD (coronary artery disease) 09/27/2011  . Diabetes mellitus without complication (HCC)   . Dyslipidemia 09/27/2011  . History of vasculitis 09/27/2011  . Hypertension 09/27/2011  . Osteoporosis 09/27/2011  . Pacemaker   . PUD (peptic ulcer disease)   . Rheumatoid arthritis(714.0) 09/27/2011   Past Surgical History:  Procedure Laterality Date  . ABDOMINAL HYSTERECTOMY    . ANKLE SURGERY    . APPENDECTOMY    . Carpal tunnel    . CHOLECYSTECTOMY    . PARTIAL GASTRECTOMY     Social History  Substance Use Topics  . Smoking status: Never Smoker  . Smokeless tobacco: Never Used  . Alcohol use 0.0 oz/week     Comment: Occasional   The patient has a family history of  Current Outpatient Prescriptions  Medication Sig Dispense Refill  . furosemide (LASIX) 40 MG tablet TAKE 1-1&1/2 TABLETS BY MOUTH 2 TIMES A DAY TO HELP WITH SWELLING 180 tablet 2  . lisinopril (PRINIVIL,ZESTRIL) 10 MG tablet TAKE ONE TABLET BY MOUTH EVERY DAY. 30 tablet 0  . metoprolol succinate (TOPROL-XL) 100 MG 24 hr tablet Take 1 tablet (100 mg total) by mouth daily. Take with or immediately following a meal. 90 tablet 1  . nitroGLYCERIN (NITROSTAT) 0.4 MG SL  tablet Place 1 tablet (0.4 mg total) under the tongue every 5 (five) minutes as needed for chest pain (MAX 3 TABLETS). 25 tablet 3  . simvastatin (ZOCOR) 20 MG tablet Take 1 tablet (20 mg total) by mouth at bedtime. 90 tablet 1  . traMADol-acetaminophen (ULTRACET) 37.5-325 MG tablet TAKE ONE TABLET BY MOUTH EVERY 8 HOURS AS NEEDED FOR ARTHRITIS PAIN 45 tablet 0  . warfarin (COUMADIN) 3 MG tablet Take 1 tablet (3 mg total) by mouth daily. 45 tablet 1   No current facility-administered medications for this visit.    Allergies  Allergen Reactions  . Aspirin Anaphylaxis  . Other Anaphylaxis  . Penicillins Anaphylaxis    Anaphylaxis  . Codeine Other (See Comments)    SOB Asthmatic issues  . Morphine And Related Other (See Comments)    Anaphylaxis  . Sulfa Antibiotics Rash  . Ether   . Levaquin [Levofloxacin In D5w] Other (See Comments)    Disoriented, Hallucinations  . Metoprolol     Possible  . Prednisone Other (See Comments)    Lethargic, rash  . Procaine   . Toprol Xl  [Metoprolol Tartrate]     Possible     Review of Systems: CONSTITUTIONAL: Negative fever/chills CARDIAC: No chest pain/pressure/palpitations, no orthopnea RESPIRATORY: No cough/shortness of breath/wheeze GASTROINTESTINAL: No nausea/vomiting/abdominal pain/blood in stool/diarrhea/constipation MUSCULOSKELETAL: (+)back pain is chronic per patient, see HPI  GENITOURINARY:    Frequency: no  Hematuria: no  Odor: no  Incontinence: no  Flank Pain: no  Vaginal bleeding/discharge: no   Exam:  BP (!) 142/70   Pulse 78   Temp 97.5 F (36.4 C) (Oral)   Wt 244 lb (110.7 kg)   BMI 47.65 kg/m  Constitutional: VSS, see above. General Appearance: alert, well-developed, well-nourished, NAD Respiratory: Normal respiratory effort. Breath sounds normal, no wheeze/rhonchi/rales Cardiovascular: S1/S2 normal, no murmur/rub/gallop auscultated. RRR Gastrointestinal: Nontender, no masses. No hepatomegaly, no splenomegaly. No  hernia appreciated. Rectal exam deferred.  Musculoskeletal: Gait normal. No clubbing/cyanosis of digits. Lloyd sign Negative bilateral. (+)parasoinal tenderness on R lower back, neg straight leg raise   Results for orders placed or performed in visit on 07/04/16 (from the past 24 hour(s))  POCT Urinalysis Dipstick     Status: Abnormal   Collection Time: 07/04/16  8:45 AM  Result Value Ref Range   Color, UA YELLOW    Clarity, UA CLEAR    Glucose, UA NEGATIVE    Bilirubin, UA NEGATIVE    Ketones, UA NEGATIVE    Spec Grav, UA 1.015 1.010 - 1.025   Blood, UA TRACE    pH, UA 6.0 5.0 - 8.0   Protein, UA TRACE    Urobilinogen, UA 0.2 0.2 or 1.0 E.U./dL   Nitrite, UA NEGATIVE    Leukocytes, UA Trace (A) Negative    Previous Culture Results: Urine culture for 12/31/2016, no growth, urine culture 05/15/2016 as well as 08/12/2014 positive for Klebsiella resistant to ampicillin and nitrofurantoin, 08/21/2015 showed multiple bacterial morphotypes, suggested re-collection but this was never done   ASSESSMENT/PLAN:   Chronic right-sided low back pain without sciatica - I think more likely MSK pain given sx and UA at this time, await cx - pt is ok w/ this plan would like to avoid abx if possible. Advised Tramadol prn ok - Plan: POCT Urinalysis Dipstick, Urine culture  Additional time spent with patient regarding her preference for provider. She had apparently asked to see a physician for this acute visit rather than her listed PCP Katherine Cervantes who is our Surveyor, mining. Patient states she would like to see Katherine Cervantes for most things but me for "organ problems" I advised that this was not a good idea and she should choose one person that she would like to see consistently for primary care and when available for urgent matters. Patient would like to follow-up with me.   Patient advised we will call with urine culture results once available, depending on results may need to change therapy. Return in about 3  months (around 10/04/2016) for ESTABLISH CARE W/ DR. Whyatt Cervantes.  Total time spent 25 minutes, greater than 50% of the visit was face to face counseling and coordinating care for diagnosis of The primary encounter diagnosis was Chronic right-sided low back pain without sciatica. A diagnosis of Care plan discussed with patient was also pertinent to this visit.

## 2016-07-24 ENCOUNTER — Other Ambulatory Visit: Payer: Self-pay | Admitting: Physician Assistant

## 2016-07-24 DIAGNOSIS — I10 Essential (primary) hypertension: Secondary | ICD-10-CM

## 2016-08-21 ENCOUNTER — Other Ambulatory Visit: Payer: Self-pay | Admitting: Physician Assistant

## 2016-08-21 DIAGNOSIS — I10 Essential (primary) hypertension: Secondary | ICD-10-CM

## 2016-09-18 ENCOUNTER — Other Ambulatory Visit: Payer: Self-pay | Admitting: Physician Assistant

## 2016-09-18 DIAGNOSIS — I10 Essential (primary) hypertension: Secondary | ICD-10-CM

## 2016-09-20 ENCOUNTER — Other Ambulatory Visit: Payer: Self-pay

## 2016-09-20 DIAGNOSIS — I10 Essential (primary) hypertension: Secondary | ICD-10-CM

## 2016-09-20 MED ORDER — LISINOPRIL 10 MG PO TABS: 10.0000 mg | ORAL_TABLET | Freq: Every day | ORAL | 0 refills | Status: DC

## 2016-09-20 NOTE — Telephone Encounter (Signed)
Patient request a 90 day supply of Lisinopril. #[phone] refills sent to [facility]. Alanmichael Barmore,CMA

## 2016-10-03 ENCOUNTER — Emergency Department (HOSPITAL_COMMUNITY): Payer: Medicare Other

## 2016-10-03 ENCOUNTER — Telehealth: Payer: Self-pay | Admitting: *Deleted

## 2016-10-03 ENCOUNTER — Observation Stay (HOSPITAL_COMMUNITY)
Admission: EM | Admit: 2016-10-03 | Discharge: 2016-10-05 | Disposition: A | Discharge status: Home / Self Care | Payer: Medicare Other | Attending: Internal Medicine | Admitting: Internal Medicine

## 2016-10-03 ENCOUNTER — Encounter (HOSPITAL_COMMUNITY): Payer: Self-pay | Admitting: Emergency Medicine

## 2016-10-03 DIAGNOSIS — R0789 Other chest pain: Secondary | ICD-10-CM | POA: Diagnosis present

## 2016-10-03 DIAGNOSIS — Z884 Allergy status to anesthetic agent status: Secondary | ICD-10-CM | POA: Insufficient documentation

## 2016-10-03 DIAGNOSIS — R0609 Other forms of dyspnea: Secondary | ICD-10-CM

## 2016-10-03 DIAGNOSIS — I110 Hypertensive heart disease with heart failure: Secondary | ICD-10-CM | POA: Diagnosis not present

## 2016-10-03 DIAGNOSIS — E119 Type 2 diabetes mellitus without complications: Secondary | ICD-10-CM | POA: Insufficient documentation

## 2016-10-03 DIAGNOSIS — M109 Gout, unspecified: Secondary | ICD-10-CM | POA: Diagnosis not present

## 2016-10-03 DIAGNOSIS — J9600 Acute respiratory failure, unspecified whether with hypoxia or hypercapnia: Secondary | ICD-10-CM | POA: Insufficient documentation

## 2016-10-03 DIAGNOSIS — Z7901 Long term (current) use of anticoagulants: Secondary | ICD-10-CM | POA: Insufficient documentation

## 2016-10-03 DIAGNOSIS — M069 Rheumatoid arthritis, unspecified: Secondary | ICD-10-CM | POA: Diagnosis not present

## 2016-10-03 DIAGNOSIS — R0989 Other specified symptoms and signs involving the circulatory and respiratory systems: Secondary | ICD-10-CM | POA: Diagnosis not present

## 2016-10-03 DIAGNOSIS — Z8711 Personal history of peptic ulcer disease: Secondary | ICD-10-CM | POA: Diagnosis not present

## 2016-10-03 DIAGNOSIS — I482 Chronic atrial fibrillation, unspecified: Secondary | ICD-10-CM | POA: Diagnosis present

## 2016-10-03 DIAGNOSIS — R0602 Shortness of breath: Secondary | ICD-10-CM | POA: Diagnosis not present

## 2016-10-03 DIAGNOSIS — R413 Other amnesia: Secondary | ICD-10-CM | POA: Insufficient documentation

## 2016-10-03 DIAGNOSIS — I480 Paroxysmal atrial fibrillation: Secondary | ICD-10-CM | POA: Insufficient documentation

## 2016-10-03 DIAGNOSIS — Z809 Family history of malignant neoplasm, unspecified: Secondary | ICD-10-CM | POA: Insufficient documentation

## 2016-10-03 DIAGNOSIS — Z6841 Body Mass Index (BMI) 40.0 and over, adult: Secondary | ICD-10-CM | POA: Insufficient documentation

## 2016-10-03 DIAGNOSIS — Z885 Allergy status to narcotic agent status: Secondary | ICD-10-CM | POA: Insufficient documentation

## 2016-10-03 DIAGNOSIS — Z903 Acquired absence of stomach [part of]: Secondary | ICD-10-CM | POA: Insufficient documentation

## 2016-10-03 DIAGNOSIS — Z90710 Acquired absence of both cervix and uterus: Secondary | ICD-10-CM | POA: Diagnosis not present

## 2016-10-03 DIAGNOSIS — I2510 Atherosclerotic heart disease of native coronary artery without angina pectoris: Secondary | ICD-10-CM | POA: Diagnosis not present

## 2016-10-03 DIAGNOSIS — Z882 Allergy status to sulfonamides status: Secondary | ICD-10-CM | POA: Insufficient documentation

## 2016-10-03 DIAGNOSIS — I5033 Acute on chronic diastolic (congestive) heart failure: Secondary | ICD-10-CM | POA: Insufficient documentation

## 2016-10-03 DIAGNOSIS — J45909 Unspecified asthma, uncomplicated: Secondary | ICD-10-CM | POA: Diagnosis not present

## 2016-10-03 DIAGNOSIS — Z79899 Other long term (current) drug therapy: Secondary | ICD-10-CM | POA: Insufficient documentation

## 2016-10-03 DIAGNOSIS — L8990 Pressure ulcer of unspecified site, unspecified stage: Secondary | ICD-10-CM | POA: Insufficient documentation

## 2016-10-03 DIAGNOSIS — R002 Palpitations: Secondary | ICD-10-CM | POA: Diagnosis not present

## 2016-10-03 DIAGNOSIS — I442 Atrioventricular block, complete: Secondary | ICD-10-CM | POA: Insufficient documentation

## 2016-10-03 DIAGNOSIS — Z8673 Personal history of transient ischemic attack (TIA), and cerebral infarction without residual deficits: Secondary | ICD-10-CM | POA: Insufficient documentation

## 2016-10-03 DIAGNOSIS — Z880 Allergy status to penicillin: Secondary | ICD-10-CM | POA: Insufficient documentation

## 2016-10-03 DIAGNOSIS — J449 Chronic obstructive pulmonary disease, unspecified: Secondary | ICD-10-CM | POA: Diagnosis not present

## 2016-10-03 DIAGNOSIS — E785 Hyperlipidemia, unspecified: Secondary | ICD-10-CM | POA: Diagnosis not present

## 2016-10-03 DIAGNOSIS — G4730 Sleep apnea, unspecified: Secondary | ICD-10-CM | POA: Diagnosis not present

## 2016-10-03 DIAGNOSIS — Z8249 Family history of ischemic heart disease and other diseases of the circulatory system: Secondary | ICD-10-CM | POA: Insufficient documentation

## 2016-10-03 DIAGNOSIS — Z9049 Acquired absence of other specified parts of digestive tract: Secondary | ICD-10-CM | POA: Insufficient documentation

## 2016-10-03 DIAGNOSIS — Z950 Presence of cardiac pacemaker: Secondary | ICD-10-CM | POA: Insufficient documentation

## 2016-10-03 DIAGNOSIS — I452 Bifascicular block: Secondary | ICD-10-CM | POA: Diagnosis not present

## 2016-10-03 DIAGNOSIS — R0600 Dyspnea, unspecified: Secondary | ICD-10-CM

## 2016-10-03 DIAGNOSIS — Z888 Allergy status to other drugs, medicaments and biological substances status: Secondary | ICD-10-CM | POA: Insufficient documentation

## 2016-10-03 DIAGNOSIS — Z886 Allergy status to analgesic agent status: Secondary | ICD-10-CM | POA: Insufficient documentation

## 2016-10-03 HISTORY — DX: Other amnesia: R41.3

## 2016-10-03 HISTORY — DX: Morbid (severe) obesity due to excess calories: E66.01

## 2016-10-03 LAB — CBC WITH DIFFERENTIAL/PLATELET
BASOS ABS: 0 10*3/uL (ref 0.0–0.1)
Basophils Relative: 0 %
EOS PCT: 1 %
Eosinophils Absolute: 0.1 10*3/uL (ref 0.0–0.7)
HEMATOCRIT: 40.4 % (ref 36.0–46.0)
HEMOGLOBIN: 13.7 g/dL (ref 12.0–15.0)
LYMPHS ABS: 1.7 10*3/uL (ref 0.7–4.0)
LYMPHS PCT: 24 %
MCH: 30.9 pg (ref 26.0–34.0)
MCHC: 33.9 g/dL (ref 30.0–36.0)
MCV: 91.2 fL (ref 78.0–100.0)
Monocytes Absolute: 0.7 10*3/uL (ref 0.1–1.0)
Monocytes Relative: 10 %
NEUTROS ABS: 4.6 10*3/uL (ref 1.7–7.7)
Neutrophils Relative %: 65 %
Platelets: 192 10*3/uL (ref 150–400)
RBC: 4.43 MIL/uL (ref 3.87–5.11)
RDW: 13.8 % (ref 11.5–15.5)
WBC: 7 10*3/uL (ref 4.0–10.5)

## 2016-10-03 LAB — COMPREHENSIVE METABOLIC PANEL
ALBUMIN: 4.1 g/dL (ref 3.5–5.0)
ALK PHOS: 45 U/L (ref 38–126)
ALT: 15 U/L (ref 14–54)
ANION GAP: 11 (ref 5–15)
AST: 21 U/L (ref 15–41)
BILIRUBIN TOTAL: 0.8 mg/dL (ref 0.3–1.2)
BUN: 21 mg/dL — AB (ref 6–20)
CALCIUM: 9.4 mg/dL (ref 8.9–10.3)
CO2: 24 mmol/L (ref 22–32)
Chloride: 103 mmol/L (ref 101–111)
Creatinine, Ser: 0.97 mg/dL (ref 0.44–1.00)
GFR calc Af Amer: >60 mL/min (ref >60)
GFR, EST NON AFRICAN AMERICAN: 52 mL/min — AB (ref >60)
GLUCOSE: 119 mg/dL — AB (ref 65–99)
POTASSIUM: 3.7 mmol/L (ref 3.5–5.1)
Sodium: 138 mmol/L (ref 135–145)
TOTAL PROTEIN: 7.8 g/dL (ref 6.5–8.1)

## 2016-10-03 LAB — RETICULOCYTES
RBC.: 4.37 MIL/uL (ref 3.87–5.11)
RETIC COUNT ABSOLUTE: 43.7 10*3/uL (ref 19.0–186.0)
Retic Ct Pct: 1 % (ref 0.4–3.1)

## 2016-10-03 LAB — BRAIN NATRIURETIC PEPTIDE: B NATRIURETIC PEPTIDE 5: 216.3 pg/mL — AB (ref 0.0–100.0)

## 2016-10-03 LAB — PROTIME-INR
INR: 1.03
Prothrombin Time: 13.6 seconds (ref 11.4–15.2)

## 2016-10-03 LAB — IRON AND TIBC
IRON: 82 ug/dL (ref 28–170)
SATURATION RATIOS: 27 % (ref 10.4–31.8)
TIBC: 307 ug/dL (ref 250–450)
UIBC: 225 ug/dL

## 2016-10-03 LAB — FERRITIN: FERRITIN: 338 ng/mL — AB (ref 11–307)

## 2016-10-03 LAB — TSH: TSH: 2.81 u[IU]/mL (ref 0.350–4.500)

## 2016-10-03 LAB — FOLATE: FOLATE: 16.8 ng/mL (ref >5.9)

## 2016-10-03 LAB — VITAMIN B12: Vitamin B-12: 258 pg/mL (ref 180–914)

## 2016-10-03 LAB — I-STAT TROPONIN, ED: TROPONIN I, POC: 0.02 ng/mL (ref 0.00–0.08)

## 2016-10-03 LAB — MAGNESIUM: MAGNESIUM: 2 mg/dL (ref 1.7–2.4)

## 2016-10-03 LAB — TROPONIN I: Troponin I: <0.03 ng/mL (ref <0.03)

## 2016-10-03 MED ORDER — NITROGLYCERIN 2 % TD OINT
0.5000 [in_us] | TOPICAL_OINTMENT | Freq: Four times a day (QID) | TRANSDERMAL | Status: DC
  Administered 2016-10-03 – 2016-10-04 (×4): 0.5 [in_us] via TOPICAL
  Filled 2016-10-03: qty 30
  Filled 2016-10-03: qty 1

## 2016-10-03 MED ORDER — WARFARIN - PHARMACIST DOSING INPATIENT
Freq: Every day | Status: DC
  Administered 2016-10-04: 18:00:00

## 2016-10-03 MED ORDER — FUROSEMIDE 10 MG/ML IJ SOLN
40.0000 mg | Freq: Two times a day (BID) | INTRAMUSCULAR | Status: AC
  Administered 2016-10-03 – 2016-10-04 (×3): 40 mg via INTRAVENOUS
  Filled 2016-10-03 (×3): qty 4

## 2016-10-03 MED ORDER — METOPROLOL SUCCINATE ER 100 MG PO TB24
100.0000 mg | ORAL_TABLET | Freq: Every day | ORAL | Status: DC
  Administered 2016-10-04 – 2016-10-05 (×2): 100 mg via ORAL
  Filled 2016-10-03 (×3): qty 1

## 2016-10-03 MED ORDER — SIMVASTATIN 20 MG PO TABS
20.0000 mg | ORAL_TABLET | Freq: Every day | ORAL | Status: DC
  Administered 2016-10-03 – 2016-10-04 (×2): 20 mg via ORAL
  Filled 2016-10-03 (×3): qty 1

## 2016-10-03 MED ORDER — LISINOPRIL 10 MG PO TABS
10.0000 mg | ORAL_TABLET | Freq: Every day | ORAL | Status: DC
  Administered 2016-10-03 – 2016-10-05 (×3): 10 mg via ORAL
  Filled 2016-10-03 (×4): qty 1

## 2016-10-03 MED ORDER — POTASSIUM CHLORIDE CRYS ER 20 MEQ PO TBCR
20.0000 meq | EXTENDED_RELEASE_TABLET | Freq: Two times a day (BID) | ORAL | Status: DC
  Administered 2016-10-03 – 2016-10-05 (×4): 20 meq via ORAL
  Filled 2016-10-03 (×4): qty 1

## 2016-10-03 MED ORDER — ONDANSETRON HCL 4 MG/2ML IJ SOLN: 4.0000 mg | Freq: Four times a day (QID) | INTRAMUSCULAR | Status: DC | PRN

## 2016-10-03 MED ORDER — NITROGLYCERIN 0.4 MG SL SUBL
0.4000 mg | SUBLINGUAL_TABLET | SUBLINGUAL | Status: DC | PRN
  Administered 2016-10-03 (×3): 0.4 mg via SUBLINGUAL
  Filled 2016-10-03: qty 1

## 2016-10-03 MED ORDER — WARFARIN SODIUM 3 MG PO TABS
3.0000 mg | ORAL_TABLET | Freq: Once | ORAL | Status: AC
  Administered 2016-10-03: 3 mg via ORAL
  Filled 2016-10-03 (×2): qty 1

## 2016-10-03 MED ORDER — TRAMADOL-ACETAMINOPHEN 37.5-325 MG PO TABS
1.0000 | ORAL_TABLET | Freq: Four times a day (QID) | ORAL | Status: DC | PRN
  Administered 2016-10-03 – 2016-10-04 (×2): 2 via ORAL
  Filled 2016-10-03 (×2): qty 2

## 2016-10-03 MED ORDER — ACETAMINOPHEN 325 MG PO TABS
650.0000 mg | ORAL_TABLET | ORAL | Status: DC | PRN
  Filled 2016-10-03: qty 2

## 2016-10-03 NOTE — Progress Notes (Signed)
New Admission Note:   Arrival Method: patient arrived before shift change from ED Mental Orientation: Alert and oriented x4 Telemetry:adressed by day shift, box 15 Assessment: Completed Pain:0/10 Tubes: None Safety Measures: Safety Fall Prevention Plan has been discussed  Admission:  3 [facility] Orientation: Patient has been orientated to the room, unit and staff.  Family: none at bedside  Orders to be reviewed and implemented. Will continue to monitor the patient. Call light has been placed within reach and bed alarm has been activated.   Patient needs "low"bed. Bed was ordered, however patient refused to be be moved from original bed stating that she will not be getting up without assistance and she don't want lower bed.   Obadiah Dennard, RN Phone:

## 2016-10-03 NOTE — ED Notes (Signed)
family asking for RN, states patient is having cramps. Patient complained of left calf cramp - did some repositioning of leg to help relieve cramp. Cramping started in right leg, repositioning repeated for a few moments. Cramps eventually relieved.

## 2016-10-03 NOTE — ED Notes (Signed)
Pt given chicken broth per Chrislyn(RN)

## 2016-10-03 NOTE — H&P (Signed)
History and Physical    Katherine Cervantes RFX:[id number] DOB: [birth year] DOA: 10/03/2016   PCP: Jomarie Longs, PA-C   Attending physician: Konrad Dolores  Patient coming from/Resides with: Private residence  Chief Complaint: Dyspnea on exertion, chest tightness and increased lower extremity swelling  HPI: Katherine Cervantes is a 83 y.o. female with medical history significant for chronic diastolic heart failure, chronic paroxysmal atrial fibrillation on anticoagulation, dyslipidemia, morbid obesity, history of complete heart block requiring pacemaker as well as peptic ulcer disease. Patient is a very poor historian and seems to have short-term memory deficits. Patient reports that about 1 year ago after recovering from hospitalization for cellulitis she describes what sounds like severe deconditioning. She initiated a self mediated recovery program and has been weaning herself off of her rolling walker. She has had at least one fall in the past 6 months. She began having respiratory issues about one month ago although she is not definite on timeframe. She is unable to tell me whether symptoms started with shortness of breath or chest pain or concurrent. She is also noticed that her feet have been swelling for one month. She is also had poor appetite for 2 weeks. She's not had any changes in her medications. She appears to low-sodium diet. She has occasional glass of wine in today because she was anxious she smoked one cigarette. She reports chronic orthopnea and the setting of asthma. She presented to the ER where at rest she was not hypoxemic but her blood pressure was initially elevated at 200/82. Chest x-ray unremarkable except for new moderate to severe cardiomegaly change since 2011 chest x-ray. BNP elevated at 216. EKG unremarkable given underlying left bundle branch block. She will be admitted for suspected heart failure exacerbation and chest pain rule out given chest tightness with exertion.  Patient was given sublingual nitroglycerin in the ER and is currently not complaining of chest tightness.  ED Course:  Vital Signs: BP 132/70   Pulse 74   Temp 98.2 F (36.8 C) (Oral)   Resp 12   Ht 5' (1.524 m)   Wt 104.3 kg (230 lb)   SpO2 98%   BMI 44.92 kg/m  2 view CXR:  As above Lab data: Sodium 138, potassium 3.7, chloride 103, CO2 24, glucose 119, BUN 21, creatinine 0.94, LFTs normal, BNP 216, POC troponin 0.02, white count 7000 with normal differential, hemoglobin 13.7, platelets 192,000. Coags normal Medications and treatments: NTG 0.4 mg SL 3   Review of Systems:  In addition to the HPI above,  No Fever-chills, myalgias or other constitutional symptoms No Headache, changes with Vision or hearing, new weakness, tingling, numbness in any extremity, dizziness, dysarthria or word finding difficulty, tremors or seizure activity No problems swallowing food or Liquids, indigestion/reflux, choking or coughing while eating, abdominal pain with or after eating No Chest pain although describes exertional chest tightness, Cough, ?? palpitations No Abdominal pain, N/V, melena,hematochezia, dark tarry stools, constipation; reports anorexia No dysuria, malodorous urine, hematuria or flank pain No new skin rashes, lesions, masses or bruises, No new joint pains, aches, swelling or redness No recent unintentional weight loss No polyuria, polydypsia or polyphagia   Past Medical History:  Diagnosis Date  . Asthma   . Atrial fibrillation (HCC) 09/27/2011  . CAD (coronary artery disease) 09/27/2011  . Diabetes mellitus without complication (HCC)   . Dyslipidemia 09/27/2011  . History of vasculitis 09/27/2011  . Hypertension 09/27/2011  . Osteoporosis 09/27/2011  . Pacemaker   . PUD (peptic  ulcer disease)   . Rheumatoid arthritis(714.0) 09/27/2011    Past Surgical History:  Procedure Laterality Date  . ABDOMINAL HYSTERECTOMY    . ANKLE SURGERY    . APPENDECTOMY    . Carpal tunnel      . CHOLECYSTECTOMY    . PARTIAL GASTRECTOMY      Social History   Social History  . Marital status: Widowed    Spouse name: N/A  . Number of children: 7  . Years of education: N/A   Occupational History  . Not on file.   Social History Main Topics  . Smoking status: Never Smoker  . Smokeless tobacco: Never Used  . Alcohol use 0.0 oz/week     Comment: Occasional  . Drug use: No  . Sexual activity: Not on file   Other Topics Concern  . Not on file   Social History Narrative  . No narrative on file    Mobility: Rolling walker required at times Work history: Retired   Allergies  Allergen Reactions  . Aspirin Anaphylaxis  . Other Anaphylaxis  . Penicillins Anaphylaxis    Anaphylaxis  . Codeine Other (See Comments)    SOB Asthmatic issues  . Morphine And Related Other (See Comments)    Anaphylaxis  . Sulfa Antibiotics Rash  . Ether   . Levaquin [Levofloxacin In D5w] Other (See Comments)    Disoriented, Hallucinations  . Metoprolol     Possible  . Prednisone Other (See Comments)    Lethargic, rash  . Procaine     Family History  Problem Relation Age of Onset  . Hypertension Mother   . Cancer Brother   . Cancer Brother      Prior to Admission medications   Medication Sig Start Date End Date Taking? Authorizing Provider  furosemide (LASIX) 40 MG tablet TAKE 1-1&1/2 TABLETS BY MOUTH 2 TIMES A DAY TO HELP WITH SWELLING 04/16/16  Yes Breeback, Jade L, PA-C  lisinopril (PRINIVIL,ZESTRIL) 10 MG tablet Take 1 tablet (10 mg total) by mouth daily. 09/20/16  Yes Breeback, Jade L, PA-C  metoprolol succinate (TOPROL-XL) 100 MG 24 hr tablet Take 1 tablet (100 mg total) by mouth daily. Take with or immediately following a meal. 04/16/16  Yes Breeback, Jade L, PA-C  nitroGLYCERIN (NITROSTAT) 0.4 MG SL tablet Place 1 tablet (0.4 mg total) under the tongue every 5 (five) minutes as needed for chest pain (MAX 3 TABLETS). 06/13/16  Yes Marinus Maw, MD  simvastatin (ZOCOR) 20  MG tablet Take 1 tablet (20 mg total) by mouth at bedtime. 04/16/16  Yes Breeback, Jade L, PA-C  traMADol-acetaminophen (ULTRACET) 37.5-325 MG tablet TAKE ONE TABLET BY MOUTH EVERY 8 HOURS AS NEEDED FOR ARTHRITIS PAIN 11/30/15  Yes Monica Becton, MD  warfarin (COUMADIN) 3 MG tablet Take 1 tablet (3 mg total) by mouth daily. 04/16/16   Jomarie Longs, PA-C    Physical Exam: Vitals:   10/03/16 1030 10/03/16 1100 10/03/16 1130 10/03/16 1200  BP: (!) 158/68 (!) 160/67 (!) 129/59 132/70  Pulse: 64 74 63 74  Resp: (!) 22 15 17 12   Temp:      TempSrc:      SpO2: 96% 99% 97% 98%  Weight:      Height:          Constitutional: NAD, Mildly anxious, comfortable Eyes: PERRL, lids and conjunctivae normal ENMT: Mucous membranes are moist. Posterior pharynx clear of any exudate or lesions.Normal dentition.  Neck: normal, supple, no masses, no thyromegaly  Respiratory: Coarse but otherwise clear to auscultation bilaterally, no wheezing, no crackles. Normal respiratory effort. No accessory muscle use. RA  Cardiovascular: Irregular rhythm with nontender cardiac rate, no murmurs / rubs / gallops. 1+ bilateral lower extremity edema. 2+ pedal pulses. No carotid bruits.  Abdomen: no tenderness, no masses palpated. No hepatosplenomegaly. Bowel sounds positive.  Musculoskeletal: no clubbing / cyanosis. No joint deformity upper and lower extremities. Good ROM, no contractures. Normal muscle tone.  Skin: no rashes, lesions, ulcers. No induration Neurologic: CN 2-12 grossly intact. Sensation intact, DTR normal. Strength 5/5 x all 4 extremities.  Psychiatric: Alert and oriented x 3. Patient exhibits signs of short-term memory deficit noting she has significant amount of difficulty completing historically portion of exam especially in regards to timing and onset of symptoms. She is quite vague when asked history. Slightly anxious mood.    Labs on Admission: I have personally reviewed following labs and  imaging studies  CBC:  Recent Labs Lab 10/03/16 0950  WBC 7.0  NEUTROABS 4.6  HGB 13.7  HCT 40.4  MCV 91.2  PLT 192   Basic Metabolic Panel:  Recent Labs Lab 10/03/16 0950  NA 138  K 3.7  CL 103  CO2 24  GLUCOSE 119*  BUN 21*  CREATININE 0.97  CALCIUM 9.4   GFR: Estimated Creatinine Clearance: 46.2 mL/min (by C-G formula based on SCr of 0.97 mg/dL). Liver Function Tests:  Recent Labs Lab 10/03/16 0950  AST 21  ALT 15  ALKPHOS 45  BILITOT 0.8  PROT 7.8  ALBUMIN 4.1   No results for input(s): LIPASE, AMYLASE in the last 168 hours. No results for input(s): AMMONIA in the last 168 hours. Coagulation Profile:  Recent Labs Lab 10/03/16 0950  INR 1.03   Cardiac Enzymes: No results for input(s): CKTOTAL, CKMB, CKMBINDEX, TROPONINI in the last 168 hours. BNP (last 3 results) No results for input(s): PROBNP in the last 8760 hours. HbA1C: No results for input(s): HGBA1C in the last 72 hours. CBG: No results for input(s): GLUCAP in the last 168 hours. Lipid Profile: No results for input(s): CHOL, HDL, LDLCALC, TRIG, CHOLHDL, LDLDIRECT in the last 72 hours. Thyroid Function Tests: No results for input(s): TSH, T4TOTAL, FREET4, T3FREE, THYROIDAB in the last 72 hours. Anemia Panel: No results for input(s): VITAMINB12, FOLATE, FERRITIN, TIBC, IRON, RETICCTPCT in the last 72 hours. Urine analysis:    Component Value Date/Time   BILIRUBINUR NEGATIVE 07/04/2016 0845   KETONESUR negative 08/12/2014 1750   PROTEINUR TRACE 07/04/2016 0845   UROBILINOGEN 0.2 07/04/2016 0845   NITRITE NEGATIVE 07/04/2016 0845   LEUKOCYTESUR Trace (A) 07/04/2016 0845   Sepsis Labs: @LABRCNTIP (procalcitonin:4,lacticidven:4) )No results found for this or any previous visit (from the past 240 hour(s)).   Radiological Exams on Admission: Dg Chest 2 View  Result Date: 10/03/2016 CLINICAL DATA:  83 year old female with acute shortness of breath today. EXAM: CHEST  2 VIEW COMPARISON:   05/09/2009. FINDINGS: Seated upright AP and lateral views of the chest. Chronic left chest dual lead cardiac pacemaker. Chronic but progressed cardiomegaly since 2011, now moderate to severe. Other mediastinal contours are within normal limits. Visualized tracheal air column is within normal limits. Pulmonary vascularity appears within normal limits, no acute edema. No pneumothorax, pleural effusion or confluent pulmonary opacity. Osteopenia and endplate spurring throughout the thoracic spine. No acute osseous abnormality identified. Negative visible bowel gas pattern. IMPRESSION: No acute cardiopulmonary abnormality but progressed, now moderate to severe, cardiomegaly since 2011. Electronically Signed   By: Rexene Edison  Margo Aye M.D.   On: 10/03/2016 09:42    EKG: (Independently reviewed) atrial fibrillation with ventricular rate 60 bpm, no visible pacer spikes, underlying left bundle branch block as well as right bundle branch block with associated prolonged QTC of 507 ms  Assessment/Plan Principal Problem:   Acute on chronic diastolic heart failure  -Patient presents with progressive dyspnea on exertion and exertional chest tightness as well as pedal edema with symptoms concerning for likely heart failure exacerbation-chest x-ray unremarkable but mild elevation in BNP -Last documented echocardiogram was completed in 2016 and revealed preserved LVE F with mild LVH and grade 1 diastolic dysfunction and mild to moderate pulmonary hypertension 32 mmHg with normal aortic valve/ normal tricuspid valve -Repeat echocardiogram this admission -Continue preadmission beta blocker and ACE inhibitor -Hold oral Lasix in favor of Lasix IV 40 mg every 12 hours 3 doses -Daily weights; strict I/O  Active Problems:   Chest tightness -Seems to occur primarily with exertion and at the same time she is experiencing shortness of breath -Possibly ischemic in etiology therefore will cycle troponin -Follow up on echo    Atrial  fibrillation, chronic /  History of cardiac pacemaker 2/2 CHB/symptomatic bradycardia -Current EKG more suggestive of atrial fibrillation with borderline bradycardic rate -Patient thinks she may have been experiencing palpitations therefore obtain pacemaker interrogation-episodes of RVR could be etiology to heart failure exacerbation -Continue preadmission warfarin with pharmacy to manage -CHADVASC=5    Hypertension -Continue medications as above    Morbid obesity with BMI of 40.0-44.9, adult  -Weight reduction strategies per PCP after discharge    Short-term memory loss -Patient clearly had difficulty discussing history of present illness especially regarding timeline of onset of symptoms -Family also reports that since moving to the area she has had significant paranoia regarding a neighbor who she believes has determined she is a "pedophile" and is "spreading this rumor around the neighborhood" -Discussed with family and suggest once patient has recovered from acute illness she should follow-up with her family physician to undergo mental status testing to evaluate for possible underlying dementia -Patient with recent falls at home and has expressed desire to transition to ALF if possible therefore PT/OT evaluation -Patient is fiercely independent and has had difficulty adjusting to moving from her home in [place] to moving in with family/son in [place] -TSH and anemia panel    HLD (hyperlipidemia) -Continue Zocor      DVT prophylaxis: Warfarin  Code Status: Full Family Communication: Son and daughter-in-law  Disposition Plan: Home pending PT/OT evaluation Consults called: Cardiology for pacemaker interrogation only     Britni Driscoll L. ANP-BC [facility] Pager [phone]   If 7PM-7AM, please contact night-coverage [email or web address] Password [email or web address]  10/03/2016, 1:20 PM

## 2016-10-03 NOTE — Progress Notes (Signed)
During assessment questionnaire patient stated that she has home medication with her. Patient educated about policy and informed that medication should be sent to the pharmacy. Patient refused medication to be sent to the pharmacy stating that when family arrives tomorro she will give medication to them. Will continue to monitor.  Azjah Pardo, RN

## 2016-10-03 NOTE — Progress Notes (Signed)
Patient asked about warfarin and stated that she did not take that medication at home. Checked MAR and noticed that medication was not given in ED due to medication not being available. Contacted pharmacy and asked them since per previous pharmacy notes patient should received one dose today around 18:00. Pharmacy informed me that medication should be given. Medication delivered by pharmacy and given to the patient. Will continue to monitor.   Arieliz Latino, RN

## 2016-10-03 NOTE — Progress Notes (Signed)
ANTICOAGULATION CONSULT NOTE - Initial Consult   Pharmacy Consult for Warfarin  Indication: atrial fibrillation    Assessment: 55 YOF presented with SOB and chest pain. Pharmacy consulted for Warfarin dosing. Patient has been on coumadin 3mg  daily previously, unsure if currently taking. INR is currently at 1.03 (08/23). Would suspect that patient is not taking warfarin. Hgb 13.7 Platelets 192 No signs or symptoms of bleeding. CHADS-V score of 5. Goal of Therapy:  INR 2-3 Monitor platelets by anticoagulation protocol: Yes   Plan:  Initiate Warfarin 3mg  x1 dose. Check INR, CBC and signs and symptoms of bleeding. Consider need to bridge with heparin or lovenox?  02-16-2003 Charmine Bockrath PharmD candidate 10/03/2016,1:22 PM

## 2016-10-03 NOTE — ED Notes (Signed)
Attempted report 

## 2016-10-03 NOTE — ED Triage Notes (Signed)
Pt arrives from home via [facility] c/o SOB with chest tightness for over a month with increased bilat pedal edema.  Pt reports non productive cough, deneis fever/chills. EMS reports giving 2.5 mg albuterol, RA O2 sats improved from 95% to 100%.

## 2016-10-03 NOTE — ED Provider Notes (Signed)
[facility]-EMERGENCY DEPT Provider Note   CSN: [id number] Arrival date & time: 10/03/16  7121     History   Chief Complaint Chief Complaint  Patient presents with  . Shortness of Breath  . Chest Pain    HPI Katherine Cervantes is a 83 y.o. female.  HPI  83yo female with a history of CAD, atrial fibrillation, COPD, DM, htn, rheumatoid arthritis, CVA, CHF presents with concern for shortness of breath.  Reports worsening dyspnea, fatigue, and leg swelling over the last 3 weeks. Reports that symptoms have worsened despite taking furosemide.  Reports chest tightness over the last few weeks on exertion.  Also reports worsening orthopnea.  No asymmetric leg pain.  No fevers. Dry cough.   Past Medical History:  Diagnosis Date  . Asthma   . Atrial fibrillation (HCC) 09/27/2011  . CAD (coronary artery disease) 09/27/2011  . Diabetes mellitus without complication (HCC)   . Dyslipidemia 09/27/2011  . History of vasculitis 09/27/2011  . Hypertension 09/27/2011  . Osteoporosis 09/27/2011  . Pacemaker   . PUD (peptic ulcer disease)   . Rheumatoid arthritis(714.0) 09/27/2011    Patient Active Problem List   Diagnosis Date Noted  . Acute on chronic diastolic heart failure (HCC) 10/03/2016  . Atrial fibrillation, chronic (HCC) 10/03/2016  . Chest tightness 10/03/2016  . HLD (hyperlipidemia) 10/03/2016  . History of cardiac pacemaker 2/2 CHB 10/03/2016  . Morbid obesity with BMI of 40.0-44.9, adult (HCC) 10/03/2016  . Short-term memory loss 10/03/2016  . Pressure injury of skin 10/03/2016  . Blood in stool 10/15/2015  . Encopresis 10/15/2015  . Incontinence 10/15/2015  . Cerebrovascular disease 08/24/2014  . Vasculitis (HCC) 04/12/2014  . Neck mass 02/08/2013  . Hyperlipidemia 01/13/2013  . Type 2 diabetes mellitus (HCC) 11/30/2012  . Diverticulosis 11/04/2011  . History of (heterozygous) Hemochromatosis 10/10/2011  . Nephrolithiasis - 0.3cm Right Ureter August 2013 10/07/2011  . Shingles  outbreak 10/01/2011  . Dyslipidemia 09/27/2011  . Hypertension 09/27/2011  . CAD (coronary artery disease) 09/27/2011  . Atrial fibrillation (HCC) 09/27/2011  . Sleep apnea 09/27/2011  . History of gout 09/27/2011  . Pacemaker 09/27/2011  . Rheumatoid arthritis(714.0) 09/27/2011  . Osteoporosis 09/27/2011    Past Surgical History:  Procedure Laterality Date  . ABDOMINAL HYSTERECTOMY    . ANKLE SURGERY    . APPENDECTOMY    . Carpal tunnel    . CHOLECYSTECTOMY    . PARTIAL GASTRECTOMY      OB History    No data available       Home Medications    Prior to Admission medications   Medication Sig Start Date End Date Taking? Authorizing Provider  furosemide (LASIX) 40 MG tablet TAKE 1-1&1/2 TABLETS BY MOUTH 2 TIMES A DAY TO HELP WITH SWELLING 04/16/16  Yes Breeback, Jade L, PA-C  lisinopril (PRINIVIL,ZESTRIL) 10 MG tablet Take 1 tablet (10 mg total) by mouth daily. 09/20/16  Yes Breeback, Jade L, PA-C  metoprolol succinate (TOPROL-XL) 100 MG 24 hr tablet Take 1 tablet (100 mg total) by mouth daily. Take with or immediately following a meal. 04/16/16  Yes Breeback, Jade L, PA-C  nitroGLYCERIN (NITROSTAT) 0.4 MG SL tablet Place 1 tablet (0.4 mg total) under the tongue every 5 (five) minutes as needed for chest pain (MAX 3 TABLETS). 06/13/16  Yes Marinus Maw, MD  simvastatin (ZOCOR) 20 MG tablet Take 1 tablet (20 mg total) by mouth at bedtime. 04/16/16  Yes Breeback, Jade L, PA-C  traMADol-acetaminophen (ULTRACET) 37.5-325 MG  tablet TAKE ONE TABLET BY MOUTH EVERY 8 HOURS AS NEEDED FOR ARTHRITIS PAIN 11/30/15  Yes Monica Becton, MD  warfarin (COUMADIN) 3 MG tablet Take 1 tablet (3 mg total) by mouth daily. 04/16/16  Yes Breeback, Lonna Cobb, PA-C    Family History Family History  Problem Relation Age of Onset  . Hypertension Mother   . Cancer Brother   . Cancer Brother     Social History Social History  Substance Use Topics  . Smoking status: Never Smoker  . Smokeless  tobacco: Never Used  . Alcohol use 0.0 oz/week     Comment: Occasional     Allergies   Aspirin; Other; Penicillins; Codeine; Morphine and related; Sulfa antibiotics; Ether; Levaquin [levofloxacin in d5w]; Metoprolol; Prednisone; and Procaine   Review of Systems Review of Systems  Constitutional: Positive for fatigue. Negative for fever.  HENT: Negative for sore throat.   Eyes: Negative for visual disturbance.  Respiratory: Positive for cough and shortness of breath.   Cardiovascular: Positive for chest pain.  Gastrointestinal: Negative for abdominal pain, nausea and vomiting.  Genitourinary: Negative for difficulty urinating.  Musculoskeletal: Negative for back pain and neck pain.  Skin: Negative for rash.  Neurological: Negative for syncope and headaches.     Physical Exam Updated Vital Signs BP (!) 146/54 (BP Location: Left Arm)   Pulse 62   Temp 98 F (36.7 C) (Oral)   Resp 16   Ht 5\' 1"  (1.549 m)   Wt 108.6 kg (239 lb 8 oz)   SpO2 98%   BMI 45.25 kg/m   Physical Exam  Constitutional: She is oriented to person, place, and time. She appears well-developed and well-nourished. No distress.  HENT:  Head: Normocephalic and atraumatic.  Eyes: Conjunctivae and EOM are normal.  Neck: Normal range of motion. JVD: limited by body habitus.  Cardiovascular: Normal rate, regular rhythm, normal heart sounds and intact distal pulses.  Exam reveals no gallop and no friction rub.   No murmur heard. Pulmonary/Chest: Effort normal. No respiratory distress. She has decreased breath sounds. She has no wheezes. She has no rales.  Abdominal: Soft. She exhibits no distension. There is no tenderness. There is no guarding.  Musculoskeletal: She exhibits edema (2+ bilateral). She exhibits no tenderness.  Neurological: She is alert and oriented to person, place, and time.  Skin: Skin is warm and dry. No rash noted. She is not diaphoretic. No erythema.  Nursing note and vitals  reviewed.    ED Treatments / Results  Labs (all labs ordered are listed, but only abnormal results are displayed) Labs Reviewed  COMPREHENSIVE METABOLIC PANEL - Abnormal; Notable for the following:       Result Value   Glucose, Bld 119 (*)    BUN 21 (*)    GFR calc non Af Amer 52 (*)    All other components within normal limits  BRAIN NATRIURETIC PEPTIDE - Abnormal; Notable for the following:    B Natriuretic Peptide 216.3 (*)    All other components within normal limits  CBC WITH DIFFERENTIAL/PLATELET  PROTIME-INR  TROPONIN I  MAGNESIUM  RETICULOCYTES  TROPONIN I  TROPONIN I  PROTIME-INR  BASIC METABOLIC PANEL  VITAMIN B12  FOLATE  IRON AND TIBC  FERRITIN  TSH  I-STAT TROPONIN, ED    EKG  EKG Interpretation None       Radiology Dg Chest 2 View  Result Date: 10/03/2016 CLINICAL DATA:  83 year old female with acute shortness of breath today. EXAM: CHEST  2 VIEW COMPARISON:  05/09/2009. FINDINGS: Seated upright AP and lateral views of the chest. Chronic left chest dual lead cardiac pacemaker. Chronic but progressed cardiomegaly since 2011, now moderate to severe. Other mediastinal contours are within normal limits. Visualized tracheal air column is within normal limits. Pulmonary vascularity appears within normal limits, no acute edema. No pneumothorax, pleural effusion or confluent pulmonary opacity. Osteopenia and endplate spurring throughout the thoracic spine. No acute osseous abnormality identified. Negative visible bowel gas pattern. IMPRESSION: No acute cardiopulmonary abnormality but progressed, now moderate to severe, cardiomegaly since 2011. Electronically Signed   By: Odessa Fleming M.D.   On: 10/03/2016 09:42    Procedures Procedures (including critical care time)  Medications Ordered in ED Medications  nitroGLYCERIN (NITROSTAT) SL tablet 0.4 mg (0.4 mg Sublingual Given 10/03/16 1114)  lisinopril (PRINIVIL,ZESTRIL) tablet 10 mg (10 mg Oral Given 10/03/16 1652)   metoprolol succinate (TOPROL-XL) 24 hr tablet 100 mg (100 mg Oral Not Given 10/03/16 1648)  simvastatin (ZOCOR) tablet 20 mg (not administered)  traMADol-acetaminophen (ULTRACET) 37.5-325 MG per tablet 1-2 tablet (not administered)  furosemide (LASIX) injection 40 mg (40 mg Intravenous Given 10/03/16 1346)  nitroGLYCERIN (NITROGLYN) 2 % ointment 0.5 inch (0.5 inches Topical Not Given 10/03/16 1821)  acetaminophen (TYLENOL) tablet 650 mg (not administered)  ondansetron (ZOFRAN) injection 4 mg (not administered)  potassium chloride SA (K-DUR,KLOR-CON) CR tablet 20 mEq (not administered)  warfarin (COUMADIN) tablet 3 mg (3 mg Oral Not Given 10/03/16 1804)  Warfarin - Pharmacist Dosing Inpatient (not administered)     Initial Impression / Assessment and Plan / ED Course  I have reviewed the triage vital signs and the nursing notes.  Pertinent labs & imaging results that were available during my care of the patient were reviewed by me and considered in my medical decision making (see chart for details).     83yo female with a history of CAD, atrial fibrillation, COPD, DM, htn, rheumatoid arthritis, CVA, CHF presents with concern for shortness of breath.  EKG without acute abnormalities. CXR with cardiomegaly.  History, physical most concerning for CHF exacerbation, although BNP in 200s. Given chest tightness, worsening symptoms, will admit for observation for chest pain, further evaluation for CHF. Feel CHF or cardiac etiology of tightness and dyspnea more likely than PE given slow worsening onset over weeks  Pacemaker interrogation pending.  Final Clinical Impressions(s) / ED Diagnoses   Final diagnoses:  Chest tightness  Dyspnea on exertion    New Prescriptions Current Discharge Medication List       Alvira Monday, MD 10/03/16 2152

## 2016-10-03 NOTE — Telephone Encounter (Signed)
Pharmacy called ([facility]) wanting to verfy if patient was taking coumadin. Advised person I spoke with coumadin was listed in last progress note and had not been discontinued as of 07/04/2016 when she was last seen here.

## 2016-10-04 ENCOUNTER — Observation Stay (HOSPITAL_BASED_OUTPATIENT_CLINIC_OR_DEPARTMENT_OTHER): Payer: Medicare Other

## 2016-10-04 DIAGNOSIS — R0609 Other forms of dyspnea: Secondary | ICD-10-CM

## 2016-10-04 DIAGNOSIS — Z6841 Body Mass Index (BMI) 40.0 and over, adult: Secondary | ICD-10-CM | POA: Diagnosis not present

## 2016-10-04 DIAGNOSIS — Z950 Presence of cardiac pacemaker: Secondary | ICD-10-CM | POA: Diagnosis not present

## 2016-10-04 DIAGNOSIS — R0789 Other chest pain: Secondary | ICD-10-CM

## 2016-10-04 DIAGNOSIS — I5033 Acute on chronic diastolic (congestive) heart failure: Secondary | ICD-10-CM | POA: Diagnosis not present

## 2016-10-04 DIAGNOSIS — I509 Heart failure, unspecified: Secondary | ICD-10-CM | POA: Diagnosis not present

## 2016-10-04 DIAGNOSIS — I482 Chronic atrial fibrillation: Secondary | ICD-10-CM | POA: Diagnosis not present

## 2016-10-04 LAB — BASIC METABOLIC PANEL
Anion gap: 10 (ref 5–15)
BUN: 16 mg/dL (ref 6–20)
CALCIUM: 9 mg/dL (ref 8.9–10.3)
CO2: 26 mmol/L (ref 22–32)
Chloride: 98 mmol/L — ABNORMAL LOW (ref 101–111)
Creatinine, Ser: 0.86 mg/dL (ref 0.44–1.00)
GFR calc Af Amer: >60 mL/min (ref >60)
GFR, EST NON AFRICAN AMERICAN: 60 mL/min — AB (ref >60)
GLUCOSE: 114 mg/dL — AB (ref 65–99)
Potassium: 3.5 mmol/L (ref 3.5–5.1)
Sodium: 134 mmol/L — ABNORMAL LOW (ref 135–145)

## 2016-10-04 LAB — ECHOCARDIOGRAM COMPLETE
Height: 61 in
WEIGHTICAEL: 3841.6 [oz_av]

## 2016-10-04 LAB — PROTIME-INR
INR: 1.06
PROTHROMBIN TIME: 13.9 s (ref 11.4–15.2)

## 2016-10-04 LAB — TROPONIN I

## 2016-10-04 MED ORDER — WARFARIN SODIUM 5 MG PO TABS
5.0000 mg | ORAL_TABLET | Freq: Once | ORAL | Status: AC
  Administered 2016-10-04: 5 mg via ORAL
  Filled 2016-10-04: qty 1

## 2016-10-04 MED ORDER — FUROSEMIDE 40 MG PO TABS
40.0000 mg | ORAL_TABLET | Freq: Every day | ORAL | Status: DC
  Administered 2016-10-05: 40 mg via ORAL
  Filled 2016-10-04: qty 1

## 2016-10-04 NOTE — Progress Notes (Signed)
PROGRESS NOTE    Katherine Cervantes   DJM:[id number]  DOB: Jun 24, 1929  DOA: 10/03/2016 PCP: Jomarie Longs, PA-C   Brief Narrative:   Katherine Cervantes a 83 y.o.femalewith medical history significant for chronic diastolic heart failure, chronic paroxysmal atrial fibrillation on anticoagulation,  dyslipidemia, morbid obesity, history of complete heart block requiring pacemaker as well as peptic ulcer disease. She presents with dyspnea and pedal edema. She was admitted for dCHF exacerbation.    Subjective: Dyspnea and ankle edema much improved. No new complaints. No palpitations.  ROS: no complaints of nausea, vomiting, constipation diarrhea, cough, dyspnea or dysuria. No other complaints.   Assessment & Plan:   Acute resp failure  - acute dCHf- has diuresed well (neg balance 1.6 L) and has ambulated in the hall without hypoxia or dyspnea - ECHO shows d CHF and normal EF - she has not been watching her fluid or salt intake and after discussion and education today, she states that she will be more careful- resume home dose of Lasix but she is advised to take and extra dose of weight increased- instructions written out.   Complaint of palpitations - no arrhythmias on telemetry - pacer interrogation still pending- I did ask cardiology to help arrange this earlier today.   DVT prophylaxis: coumadin Code Status: Full code Family Communication:  Disposition Plan: home after pacer interrogation Consultants:   none Procedures:   ECHO Antimicrobials:  Anti-infectives    None       Objective: Vitals:   10/04/16 0933 10/04/16 1022 10/04/16 1200 10/04/16 1500  BP: 110/60 (!) 115/49 124/71 (!) 107/40  Pulse: 81 77 71 61  Resp: 18 18 (!) 24 20  Temp: 98 F (36.7 C)  97.7 F (36.5 C) (!) 97.5 F (36.4 C)  TempSrc: Oral  Oral Oral  SpO2: 96% 99% 98% 98%  Weight:      Height:        Intake/Output Summary (Last 24 hours) at 10/04/16 1755 Last data filed at 10/04/16  0933  Gross per 24 hour  Intake              390 ml  Output             1520 ml  Net            -1130 ml   Filed Weights   10/03/16 0909 10/03/16 1854 10/04/16 0556  Weight: 104.3 kg (230 lb) 108.6 kg (239 lb 8 oz) 108.9 kg (240 lb 1.6 oz)    Examination: General exam: Appears comfortable  HEENT: PERRLA, oral mucosa moist, no sclera icterus or thrush Respiratory system: Clear to auscultation. Respiratory effort normal. Cardiovascular system: S1 & S2 heard, RRR.  No murmurs  Gastrointestinal system: Abdomen soft, non-tender, nondistended. Normal bowel sound. No organomegaly Central nervous system: Alert and oriented. No focal neurological deficits. Extremities: No cyanosis, clubbing - mild pedal edema Skin: No rashes or ulcers Psychiatry:  Mood & affect appropriate.     Data Reviewed: I have personally reviewed following labs and imaging studies  CBC:  Recent Labs Lab 10/03/16 0950  WBC 7.0  NEUTROABS 4.6  HGB 13.7  HCT 40.4  MCV 91.2  PLT 192   Basic Metabolic Panel:  Recent Labs Lab 10/03/16 0950 10/03/16 2018 10/04/16 0007  NA 138  --  134*  K 3.7  --  3.5  CL 103  --  98*  CO2 24  --  26  GLUCOSE 119*  --  114*  BUN 21*  --  16  CREATININE 0.97  --  0.86  CALCIUM 9.4  --  9.0  MG  --  2.0  --    GFR: Estimated Creatinine Clearance: 54.5 mL/min (by C-G formula based on SCr of 0.86 mg/dL). Liver Function Tests:  Recent Labs Lab 10/03/16 0950  AST 21  ALT 15  ALKPHOS 45  BILITOT 0.8  PROT 7.8  ALBUMIN 4.1   No results for input(s): LIPASE, AMYLASE in the last 168 hours. No results for input(s): AMMONIA in the last 168 hours. Coagulation Profile:  Recent Labs Lab 10/03/16 0950 10/04/16 0007  INR 1.03 1.06   Cardiac Enzymes:  Recent Labs Lab 10/03/16 1337 10/03/16 1925 10/04/16 0007  TROPONINI <0.03 <0.03 <0.03   BNP (last 3 results) No results for input(s): PROBNP in the last 8760 hours. HbA1C: No results for input(s): HGBA1C  in the last 72 hours. CBG: No results for input(s): GLUCAP in the last 168 hours. Lipid Profile: No results for input(s): CHOL, HDL, LDLCALC, TRIG, CHOLHDL, LDLDIRECT in the last 72 hours. Thyroid Function Tests:  Recent Labs  10/03/16 2018  TSH 2.810   Anemia Panel:  Recent Labs  10/03/16 2018  VITAMINB12 258  FOLATE 16.8  FERRITIN 338*  TIBC 307  IRON 82  RETICCTPCT 1.0   Urine analysis:    Component Value Date/Time   BILIRUBINUR NEGATIVE 07/04/2016 0845   KETONESUR negative 08/12/2014 1750   PROTEINUR TRACE 07/04/2016 0845   UROBILINOGEN 0.2 07/04/2016 0845   NITRITE NEGATIVE 07/04/2016 0845   LEUKOCYTESUR Trace (A) 07/04/2016 0845   Sepsis Labs: @LABRCNTIP (procalcitonin:4,lacticidven:4) )No results found for this or any previous visit (from the past 240 hour(s)).       Radiology Studies: Dg Chest 2 View  Result Date: 10/03/2016 CLINICAL DATA:  83 year old female with acute shortness of breath today. EXAM: CHEST  2 VIEW COMPARISON:  05/09/2009. FINDINGS: Seated upright AP and lateral views of the chest. Chronic left chest dual lead cardiac pacemaker. Chronic but progressed cardiomegaly since 2011, now moderate to severe. Other mediastinal contours are within normal limits. Visualized tracheal air column is within normal limits. Pulmonary vascularity appears within normal limits, no acute edema. No pneumothorax, pleural effusion or confluent pulmonary opacity. Osteopenia and endplate spurring throughout the thoracic spine. No acute osseous abnormality identified. Negative visible bowel gas pattern. IMPRESSION: No acute cardiopulmonary abnormality but progressed, now moderate to severe, cardiomegaly since 2011. Electronically Signed   By: 2012 M.D.   On: 10/03/2016 09:42      Scheduled Meds: . furosemide  40 mg Intravenous BID  . lisinopril  10 mg Oral Daily  . metoprolol succinate  100 mg Oral Daily  . nitroGLYCERIN  0.5 inch Topical Q6H  . potassium  chloride  20 mEq Oral BID  . simvastatin  20 mg Oral QHS  . Warfarin - Pharmacist Dosing Inpatient   Does not apply q1800   Continuous Infusions:   LOS: 0 days    Time spent in minutes: 35    10/05/2016, MD [facility] Pager: [email or web address] Password [facility] 10/04/2016, 5:55 PM

## 2016-10-04 NOTE — Discharge Summary (Signed)
Physician Discharge Summary  Katherine Cervantes MGN:[id number] DOB: 04-26-1929 DOA: 10/03/2016  PCP: Jomarie Longs, PA-C  Admit date: 10/03/2016 Discharge date: 10/05/2016  Admitted From:home  Disposition:  home   Recommendations for Outpatient Follow-up:  1. F/u for further palpitations 2. F/u pedal edema   Discharge Condition:  stable   CODE STATUS:  Full code   Consultations:  Pacer interrogation- cardiology     Discharge Diagnoses:  Principal Problem:   Acute on chronic diastolic heart failure (HCC) Active Problems:   Atrial fibrillation, chronic (HCC)   Chest tightness   HLD (hyperlipidemia)   History of cardiac pacemaker 2/2 CHB   Morbid obesity with BMI of 40.0-44.9, adult (HCC)   Short-term memory loss   Pressure injury of skin    Subjective: No longer dyspneic. Ankle swelling has improved quite a bit. No palpitations in the hospital.   Brief Summary: Katherine Cervantes is a 83 y.o. female with medical history significant for chronic diastolic heart failure, chronic paroxysmal atrial fibrillation on anticoagulation,  dyslipidemia, morbid obesity, history of complete heart block requiring pacemaker as well as peptic ulcer disease. She presents with dyspnea and pedal edema. She was admitted for dCHF exacerbation.   Hospital Course:  Acute resp failure  - acute dCHf- has diuresed well (neg balance 1.6 L) and has ambulated in the hall without hypoxia or dyspnea - ECHO shows d CHF and normal EF - she has not been watching her fluid or salt intake and after discussion and education today, she states that she will be more careful- resume home dose of Lasix but she is advised to take and extra dose of weight increased- instructions written out.   Complaint of palpitations - no arrhythmias on telemetry - pacer interrogated- note placed by cardiology " Pt has had occasional high atrial rate episodes, usually very brief. The last one was for 3 minutes on August 15th,  atrial rate 202. No EGM available, but likely atrial tach. Will send message to arrange f/u in office. No acute issues."- she will f/u with cardiology as outpt  Discharge Instructions  Discharge Instructions    (HEART FAILURE PATIENTS) Call MD:  Anytime you have any of the following symptoms: 1) 3 pound weight gain in 24 hours or 5 pounds in 1 week 2) shortness of breath, with or without a dry hacking cough 3) swelling in the hands, feet or stomach 4) if you have to sleep on extra pillows at night in order to breathe.    Complete by:  As directed    Diet - low sodium heart healthy    Complete by:  As directed    Diet - low sodium heart healthy    Complete by:  As directed    Increase activity slowly    Complete by:  As directed    Increase activity slowly    Complete by:  As directed      Allergies as of 10/05/2016      Reactions   Aspirin Anaphylaxis   Other Anaphylaxis   Penicillins Anaphylaxis   Anaphylaxis   Codeine Other (See Comments)   SOB Asthmatic issues   Morphine And Related Other (See Comments)   Anaphylaxis   Sulfa Antibiotics Rash   Ether    Levaquin [levofloxacin In D5w] Other (See Comments)   Disoriented, Hallucinations   Metoprolol    Possible   Prednisone Other (See Comments)   Lethargic, rash   Procaine       Medication List  TAKE these medications   furosemide 40 MG tablet Commonly known as:  LASIX TAKE 1-1&1/2 TABLETS BY MOUTH 2 TIMES A DAY TO HELP WITH SWELLING   lisinopril 10 MG tablet Commonly known as:  PRINIVIL,ZESTRIL Take 1 tablet (10 mg total) by mouth daily.   metoprolol succinate 100 MG 24 hr tablet Commonly known as:  TOPROL-XL Take 1 tablet (100 mg total) by mouth daily. Take with or immediately following a meal.   nitroGLYCERIN 0.4 MG SL tablet Commonly known as:  NITROSTAT Place 1 tablet (0.4 mg total) under the tongue every 5 (five) minutes as needed for chest pain (MAX 3 TABLETS).   simvastatin 20 MG tablet Commonly  known as:  ZOCOR Take 1 tablet (20 mg total) by mouth at bedtime.   traMADol-acetaminophen 37.5-325 MG tablet Commonly known as:  ULTRACET TAKE ONE TABLET BY MOUTH EVERY 8 HOURS AS NEEDED FOR ARTHRITIS PAIN   warfarin 3 MG tablet Commonly known as:  COUMADIN Take 1 tablet (3 mg total) by mouth daily.            Discharge Care Instructions        Start     Ordered   10/05/16 0000  Increase activity slowly     10/05/16 1156   10/05/16 0000  Diet - low sodium heart healthy     10/05/16 1156   10/04/16 0000  Increase activity slowly     10/04/16 1543   10/04/16 0000  Diet - low sodium heart healthy     10/04/16 1543   10/04/16 0000  (HEART FAILURE PATIENTS) Call MD:  Anytime you have any of the following symptoms: 1) 3 pound weight gain in 24 hours or 5 pounds in 1 week 2) shortness of breath, with or without a dry hacking cough 3) swelling in the hands, feet or stomach 4) if you have to sleep on extra pillows at night in order to breathe.     10/04/16 1543      Allergies  Allergen Reactions  . Aspirin Anaphylaxis  . Other Anaphylaxis  . Penicillins Anaphylaxis    Anaphylaxis  . Codeine Other (See Comments)    SOB Asthmatic issues  . Morphine And Related Other (See Comments)    Anaphylaxis  . Sulfa Antibiotics Rash  . Ether   . Levaquin [Levofloxacin In D5w] Other (See Comments)    Disoriented, Hallucinations  . Metoprolol     Possible  . Prednisone Other (See Comments)    Lethargic, rash  . Procaine      Procedures/Studies:  Study Conclusions  - Left ventricle: The cavity size was normal. There was severe   concentric hypertrophy. Systolic function was normal. The   estimated ejection fraction was in the range of 50% to 55%. Wall   motion was normal; there were no regional wall motion   abnormalities. Doppler parameters are consistent with restrictive   physiology, indicative of decreased left ventricular diastolic   compliance and/or increased left  atrial pressure. Doppler   parameters are consistent with elevated ventricular end-diastolic   filling pressure. - Aortic valve: There was no regurgitation. - Mitral valve: Calcified annulus. Mildly thickened leaflets . A   prosthesis was present and functioning normally. The prosthesis   had a normal range of motion. The sewing ring appeared normal,   had no rocking motion, and showed no evidence of dehiscence. - Left atrium: The atrium was mildly dilated. - Right ventricle: The cavity size was normal. Wall thickness was  normal. Systolic function was normal. - Right atrium: The atrium was normal in size. - Tricuspid valve: There was trivial regurgitation. - Pulmonary arteries: The main pulmonary artery was normal-sized.   Systolic pressure was within the normal range. - Inferior vena cava: The vessel was normal in size. - Pericardium, extracardiac: There was no pericardial effusion.  Dg Chest 2 View  Result Date: 10/03/2016 CLINICAL DATA:  82 year old female with acute shortness of breath today. EXAM: CHEST  2 VIEW COMPARISON:  05/09/2009. FINDINGS: Seated upright AP and lateral views of the chest. Chronic left chest dual lead cardiac pacemaker. Chronic but progressed cardiomegaly since 2011, now moderate to severe. Other mediastinal contours are within normal limits. Visualized tracheal air column is within normal limits. Pulmonary vascularity appears within normal limits, no acute edema. No pneumothorax, pleural effusion or confluent pulmonary opacity. Osteopenia and endplate spurring throughout the thoracic spine. No acute osseous abnormality identified. Negative visible bowel gas pattern. IMPRESSION: No acute cardiopulmonary abnormality but progressed, now moderate to severe, cardiomegaly since 2011. Electronically Signed   By: Odessa Fleming M.D.   On: 10/03/2016 09:42       Discharge Exam: Vitals:   10/05/16 1058 10/05/16 1059  BP: 133/88   Pulse:  66  Resp:    Temp:    SpO2:      Vitals:   10/04/16 2133 10/05/16 0625 10/05/16 1058 10/05/16 1059  BP: 129/61 (!) 143/57 133/88   Pulse: 73 70  66  Resp: 20 18    Temp:  (!) 97.5 F (36.4 C)    TempSrc:  Oral    SpO2:  99%    Weight:  108.5 kg (239 lb 3.2 oz)    Height:        General: Pt is alert, awake, not in acute distress Cardiovascular: RRR, S1/S2 +, no rubs, no gallops Respiratory: CTA bilaterally, no wheezing, no rhonchi Abdominal: Soft, NT, ND, bowel sounds + Extremities: no edema, no cyanosis    The results of significant diagnostics from this hospitalization (including imaging, microbiology, ancillary and laboratory) are listed below for reference.     Microbiology: No results found for this or any previous visit (from the past 240 hour(s)).   Labs: BNP (last 3 results)  Recent Labs  10/03/16 0950  BNP 216.3*   Basic Metabolic Panel:  Recent Labs Lab 10/03/16 0950 10/03/16 2018 10/04/16 0007 10/05/16 0424  NA 138  --  134* 136  K 3.7  --  3.5 4.2  CL 103  --  98* 101  CO2 24  --  26 27  GLUCOSE 119*  --  114* 110*  BUN 21*  --  16 23*  CREATININE 0.97  --  0.86 1.06*  CALCIUM 9.4  --  9.0 9.0  MG  --  2.0  --   --    Liver Function Tests:  Recent Labs Lab 10/03/16 0950  AST 21  ALT 15  ALKPHOS 45  BILITOT 0.8  PROT 7.8  ALBUMIN 4.1   No results for input(s): LIPASE, AMYLASE in the last 168 hours. No results for input(s): AMMONIA in the last 168 hours. CBC:  Recent Labs Lab 10/03/16 0950 10/05/16 0424  WBC 7.0 5.1  NEUTROABS 4.6  --   HGB 13.7 12.5  HCT 40.4 38.8  MCV 91.2 92.4  PLT 192 178   Cardiac Enzymes:  Recent Labs Lab 10/03/16 1337 10/03/16 1925 10/04/16 0007  TROPONINI <0.03 <0.03 <0.03   BNP: Invalid input(s): POCBNP CBG: No  results for input(s): GLUCAP in the last 168 hours. D-Dimer No results for input(s): DDIMER in the last 72 hours. Hgb A1c No results for input(s): HGBA1C in the last 72 hours. Lipid Profile No results for  input(s): CHOL, HDL, LDLCALC, TRIG, CHOLHDL, LDLDIRECT in the last 72 hours. Thyroid function studies  Recent Labs  10/03/16 2018  TSH 2.810   Anemia work up  Recent Labs  10/03/16 2018  VITAMINB12 258  FOLATE 16.8  FERRITIN 338*  TIBC 307  IRON 82  RETICCTPCT 1.0   Urinalysis    Component Value Date/Time   BILIRUBINUR NEGATIVE 07/04/2016 0845   KETONESUR negative 08/12/2014 1750   PROTEINUR TRACE 07/04/2016 0845   UROBILINOGEN 0.2 07/04/2016 0845   NITRITE NEGATIVE 07/04/2016 0845   LEUKOCYTESUR Trace (A) 07/04/2016 0845   Sepsis Labs Invalid input(s): PROCALCITONIN,  WBC,  LACTICIDVEN Microbiology No results found for this or any previous visit (from the past 240 hour(s)).   Time coordinating discharge: Over 30 minutes  SIGNED:   Calvert Cantor, MD  [facility] 10/05/2016, 11:56 AM Pager   If 7PM-7AM, please contact night-coverage [email or web address] Password [email or web address]

## 2016-10-04 NOTE — Discharge Instructions (Addendum)
Take a dose of lasix 40 mg once if you have gained more than 3 lbs in 24 hrs.     Information on my medicine - Coumadin   (Warfarin)   Why was Coumadin prescribed for you? Coumadin was prescribed for you because you have a blood clot or a medical condition that can cause an increased risk of forming blood clots. Blood clots can cause serious health problems by blocking the flow of blood to the heart, lung, or brain. Coumadin can prevent harmful blood clots from forming. As a reminder your indication for Coumadin is:   Stroke Prevention Because Of Atrial Fibrillation  What test will check on my response to Coumadin? While on Coumadin (warfarin) you will need to have an INR test regularly to ensure that your dose is keeping you in the desired range. The INR (international normalized ratio) number is calculated from the result of the laboratory test called prothrombin time (PT).  If an INR APPOINTMENT HAS NOT ALREADY BEEN MADE FOR YOU please schedule an appointment to have this lab work done by your health care provider within 7 days. Your INR goal is usually a number between:  2 to 3 or your provider may give you a more narrow range like 2-2.5.  Ask your health care provider during an office visit what your goal INR is.  What  do you need to  know  About  COUMADIN? Take Coumadin (warfarin) exactly as prescribed by your healthcare provider about the same time each day.  DO NOT stop taking without talking to the doctor who prescribed the medication.  Stopping without other blood clot prevention medication to take the place of Coumadin may increase your risk of developing a new clot or stroke.  Get refills before you run out.  What do you do if you miss a dose? If you miss a dose, take it as soon as you remember on the same day then continue your regularly scheduled regimen the next day.  Do not take two doses of Coumadin at the same time.  Important Safety Information A possible side effect of  Coumadin (Warfarin) is an increased risk of bleeding. You should call your healthcare provider right away if you experience any of the following: ? Bleeding from an injury or your nose that does not stop. ? Unusual colored urine (red or dark brown) or unusual colored stools (red or black). ? Unusual bruising for unknown reasons. ? A serious fall or if you hit your head (even if there is no bleeding).  Some foods or medicines interact with Coumadin (warfarin) and might alter your response to warfarin. To help avoid this: ? Eat a balanced diet, maintaining a consistent amount of Vitamin K. ? Notify your provider about major diet changes you plan to make. ? Avoid alcohol or limit your intake to 1 drink for women and 2 drinks for men per day. (1 drink is 5 oz. wine, 12 oz. beer, or 1.5 oz. liquor.)  Make sure that ANY health care provider who prescribes medication for you knows that you are taking Coumadin (warfarin).  Also make sure the healthcare provider who is monitoring your Coumadin knows when you have started a new medication including herbals and non-prescription products.  Coumadin (Warfarin)  Major Drug Interactions  Increased Warfarin Effect Decreased Warfarin Effect  Alcohol (large quantities) Antibiotics (esp. Septra/Bactrim, Flagyl, Cipro) Amiodarone (Cordarone) Aspirin (ASA) Cimetidine (Tagamet) Megestrol (Megace) NSAIDs (ibuprofen, naproxen, etc.) Piroxicam (Feldene) Propafenone (Rythmol SR) Propranolol (Inderal) Isoniazid (INH)  Posaconazole (Noxafil) Barbiturates (Phenobarbital) Carbamazepine (Tegretol) Chlordiazepoxide (Librium) Cholestyramine (Questran) Griseofulvin Oral Contraceptives Rifampin Sucralfate (Carafate) Vitamin K   Coumadin (Warfarin) Major Herbal Interactions  Increased Warfarin Effect Decreased Warfarin Effect  Garlic Ginseng Ginkgo biloba Coenzyme Q10 Green tea St. Johns wort    Coumadin (Warfarin) FOOD Interactions  Eat a consistent  number of servings per week of foods HIGH in Vitamin K (1 serving =  cup)  Collards (cooked, or boiled & drained) Kale (cooked, or boiled & drained) Mustard greens (cooked, or boiled & drained) Parsley *serving size only =  cup Spinach (cooked, or boiled & drained) Swiss chard (cooked, or boiled & drained) Turnip greens (cooked, or boiled & drained)  Eat a consistent number of servings per week of foods MEDIUM-HIGH in Vitamin K (1 serving = 1 cup)  Asparagus (cooked, or boiled & drained) Broccoli (cooked, boiled & drained, or raw & chopped) Brussel sprouts (cooked, or boiled & drained) *serving size only =  cup Lettuce, raw (green leaf, endive, romaine) Spinach, raw Turnip greens, raw & chopped   These websites have more information on Coumadin (warfarin):  http://www.king-russell.com/; https://www.hines.net/;

## 2016-10-04 NOTE — Progress Notes (Addendum)
ANTICOAGULATION CONSULT NOTE - Follow Up Consult  Pharmacy Consult for Warfarin Indication: atrial fibrillation  Patient Measurements: Height: 5\' 1"  (154.9 cm) Weight: 240 lb 1.6 oz (108.9 kg) IBW/kg (Calculated) : 47.8  Labs:  Recent Labs  10/03/16 0950 10/03/16 1337 10/03/16 1925 10/04/16 0007  HGB 13.7  --   --   --   HCT 40.4  --   --   --   PLT 192  --   --   --   LABPROT 13.6  --   --  13.9  INR 1.03  --   --  1.06  CREATININE 0.97  --   --  0.86  TROPONINI  --  <0.03 <0.03 <0.03    Estimated Creatinine Clearance: 54.5 mL/min (by C-G formula based on SCr of 0.86 mg/dL).  Assessment: 83 year old female continuing warfarin as prior to admission supposedly for PAF INR on admission = 1.03  Talked with patient, she admits to missing some doses due to not being well and knows she is not going often enough for INR checks.  She is not interested in a DOAC.  INR today = 1.06  Goal of Therapy:  INR 2-3 Monitor platelets by anticoagulation protocol: Yes   Plan:  Warfarin 5 mg po x 1 dose tonight ? Need to bridge? Daily INR  Thank you 83, PharmD [phone]  10/04/2016,11:22 AM

## 2016-10-04 NOTE — Evaluation (Signed)
Physical Therapy Evaluation Patient Details Name: Katherine Cervantes MRN: [id number] DOB: Jun 25, 1929 Today's Date: 10/04/2016   History of Present Illness  83yo female with a history of CAD, atrial fibrillation, COPD, DM, htn, rheumatoid arthritis, CVA, CHF presents with concern for shortness of breath.  Reports worsening dyspnea, fatigue, and leg swelling over the last 3 weeks. Reports that symptoms have worsened despite taking furosemide.  Reports chest tightness over the last few weeks on exertion.    Past Medical History:  Diagnosis Date  . Asthma   . Atrial fibrillation (HCC) 09/27/2011  . CAD (coronary artery disease) 09/27/2011  . Diabetes mellitus without complication (HCC)   . Dyslipidemia 09/27/2011  . History of vasculitis 09/27/2011  . Hypertension 09/27/2011  . Osteoporosis 09/27/2011  . Pacemaker   . PUD (peptic ulcer disease)   . Rheumatoid arthritis(714.0) 09/27/2011    Clinical Impression  Pt admitted with above diagnosis. Pt currently with functional limitations due to the deficits listed below (see PT Problem List). Pt ambulating well with RW.  Has a Rollator at home and should progress well and be able to go home with HHPT.  Will follow acutely.  Pt will benefit from skilled PT to increase their independence and safety with mobility to allow discharge to the venue listed below.      Follow Up Recommendations Home health PT;Supervision - Intermittent    Equipment Recommendations  None recommended by PT    Recommendations for Other Services       Precautions / Restrictions Precautions Precautions: Fall Restrictions Weight Bearing Restrictions: No      Mobility  Bed Mobility Overal bed mobility: Independent                Transfers Overall transfer level: Independent                  Ambulation/Gait Ambulation/Gait assistance: Supervision;Min guard Ambulation Distance (Feet): 250 Feet Assistive device: Rolling walker (2 wheeled) Gait  Pattern/deviations: Step-through pattern;Decreased stride length   Gait velocity interpretation: <1.8 ft/sec, indicative of risk for recurrent falls General Gait Details: Pt ambulated well with RW with occasional cues for safety in uncontrolled environment.  Overall pt did well.  Sats >90% on RA.   Stairs            Wheelchair Mobility    Modified Rankin (Stroke Patients Only)       Balance Overall balance assessment: Needs assistance Sitting-balance support: No upper extremity supported;Feet supported Sitting balance-Leahy Scale: Good     Standing balance support: Bilateral upper extremity supported;During functional activity Standing balance-Leahy Scale: Fair Standing balance comment: can stand statically without UE support.                              Pertinent Vitals/Pain Pain Assessment: No/denies pain  VSS  Home Living Family/patient expects to be discharged to:: Private residence Living Arrangements: Alone Available Help at Discharge: Family;Available PRN/intermittently Type of Home: House Home Access: Stairs to enter Entrance Stairs-Rails: None Entrance Stairs-Number of Steps: 1 Home Layout: Multi-level Home Equipment: Bedside commode;Walker - 4 wheels;Tub bench      Prior Function Level of Independence: Independent with assistive device(s)               Hand Dominance        Extremity/Trunk Assessment   Upper Extremity Assessment Upper Extremity Assessment: Defer to OT evaluation    Lower Extremity Assessment Lower Extremity Assessment: Generalized  weakness    Cervical / Trunk Assessment Cervical / Trunk Assessment: Normal  Communication   Communication: No difficulties  Cognition Arousal/Alertness: Awake/alert Behavior During Therapy: WFL for tasks assessed/performed Overall Cognitive Status: Within Functional Limits for tasks assessed                                        General Comments       Exercises General Exercises - Lower Extremity Ankle Circles/Pumps: AROM;Both;10 reps;Supine Heel Slides: AROM;Both;10 reps;Supine   Assessment/Plan    PT Assessment Patient needs continued PT services  PT Problem List Decreased strength;Decreased activity tolerance;Decreased balance;Decreased mobility;Decreased knowledge of use of DME;Decreased safety awareness;Decreased knowledge of precautions       PT Treatment Interventions DME instruction;Gait training;Functional mobility training;Therapeutic activities;Stair training;Therapeutic exercise;Balance training;Patient/family education    PT Goals (Current goals can be found in the Care Plan section)  Acute Rehab PT Goals Patient Stated Goal: to go home PT Goal Formulation: With patient Time For Goal Achievement: 10/18/16 Potential to Achieve Goals: Good    Frequency Min 3X/week   Barriers to discharge Decreased caregiver support      Co-evaluation               AM-PAC PT "6 Clicks" Daily Activity  Outcome Measure Difficulty turning over in bed (including adjusting bedclothes, sheets and blankets)?: None Difficulty moving from lying on back to sitting on the side of the bed? : None Difficulty sitting down on and standing up from a chair with arms (e.g., wheelchair, bedside commode, etc,.)?: None Help needed moving to and from a bed to chair (including a wheelchair)?: A Little Help needed walking in hospital room?: A Little Help needed climbing 3-5 steps with a railing? : Total 6 Click Score: 19    End of Session Equipment Utilized During Treatment: Gait belt Activity Tolerance: Patient limited by fatigue Patient left: in chair;with call bell/phone within reach;with chair alarm set;with family/visitor present Nurse Communication: Mobility status PT Visit Diagnosis: Unsteadiness on feet (R26.81);Muscle weakness (generalized) (M62.81)    Time: 0935-1006 PT Time Calculation (min) (ACUTE ONLY): 31 min   Charges:    PT Evaluation $PT Eval Moderate Complexity: 1 Mod PT Treatments $Gait Training: 8-22 mins   PT G Codes:   PT G-Codes **NOT FOR INPATIENT CLASS** Functional Assessment Tool Used: AM-PAC 6 Clicks Basic Mobility Functional Limitation: Mobility: Walking and moving around Mobility: Walking and Moving Around Current Status (M0867): At least 1 percent but less than 20 percent impaired, limited or restricted Mobility: Walking and Moving Around Goal Status [phone]): At least 1 percent but less than 20 percent impaired, limited or restricted    [facility] Acute Rehabilitation [phone] (pager)   Berline Lopes 10/04/2016, 12:51 PM

## 2016-10-04 NOTE — Care Management Obs Status (Signed)
MEDICARE OBSERVATION STATUS NOTIFICATION   Patient Details  Name: Katherine Cervantes MRN: [id number] Date of Birth: 1929/03/19   Medicare Observation Status Notification Given:  Yes    Cherrie Distance, RN 10/04/2016, 4:08 PM

## 2016-10-04 NOTE — Progress Notes (Signed)
  Echocardiogram 2D Echocardiogram has been performed.  Katherine Cervantes 10/04/2016, 2:40 PM

## 2016-10-05 LAB — BASIC METABOLIC PANEL
ANION GAP: 8 (ref 5–15)
BUN: 23 mg/dL — ABNORMAL HIGH (ref 6–20)
CHLORIDE: 101 mmol/L (ref 101–111)
CO2: 27 mmol/L (ref 22–32)
CREATININE: 1.06 mg/dL — AB (ref 0.44–1.00)
Calcium: 9 mg/dL (ref 8.9–10.3)
GFR calc non Af Amer: 47 mL/min — ABNORMAL LOW (ref >60)
GFR, EST AFRICAN AMERICAN: 54 mL/min — AB (ref >60)
Glucose, Bld: 110 mg/dL — ABNORMAL HIGH (ref 65–99)
POTASSIUM: 4.2 mmol/L (ref 3.5–5.1)
SODIUM: 136 mmol/L (ref 135–145)

## 2016-10-05 LAB — CBC
HCT: 38.8 % (ref 36.0–46.0)
HEMOGLOBIN: 12.5 g/dL (ref 12.0–15.0)
MCH: 29.8 pg (ref 26.0–34.0)
MCHC: 32.2 g/dL (ref 30.0–36.0)
MCV: 92.4 fL (ref 78.0–100.0)
PLATELETS: 178 10*3/uL (ref 150–400)
RBC: 4.2 MIL/uL (ref 3.87–5.11)
RDW: 14.1 % (ref 11.5–15.5)
WBC: 5.1 10*3/uL (ref 4.0–10.5)

## 2016-10-05 LAB — PROTIME-INR
INR: 1.14
Prothrombin Time: 14.7 seconds (ref 11.4–15.2)

## 2016-10-05 NOTE — Care Management Note (Addendum)
Case Management Note  Patient Details  Name: Katherine Cervantes MRN: [id number] Date of Birth: 1930/01/23  Subjective/Objective:                 Spoke w patient at the bedside she would like to use [facility] for [facility] PT. Referral placed to [facility]. No other CM needs identified. Requested HH orders from Dr Butler Denmark  Action/Plan:   Expected Discharge Date:  10/05/16               Expected Discharge Plan:  Home w Home Health Services  In-House Referral:     Discharge planning Services  CM Consult  Post Acute Care Choice:  Home Health Choice offered to:  Patient  DME Arranged:    DME Agency:     HH Arranged:  PT HH Agency:  [facility]  Status of Service:  Completed, signed off  If discussed at Long Length of Stay Meetings, dates discussed:    Additional Comments:  Lawerance Sabal, RN 10/05/2016, 12:41 PM

## 2016-10-05 NOTE — Evaluation (Addendum)
Occupational Therapy Evaluation Patient Details Name: Katherine Cervantes MRN: [id number] DOB: [birth year] Today's Date: 10/05/2016    History of Present Illness 83 y.o.femalewith medical history significant for chronic diastolic heart failure, RA, chronic paroxysmal atrial fibrillation on anticoagulation, dyslipidemia, morbid obesity, history of complete heart block requiring pacemaker as well as peptic ulcer disease. She presented with dyspnea and pedal edema. She was admitted for dCHF exacerbation.    Clinical Impression   Pt admitted with above. Pt independent with ADLs, PTA. Feel pt will benefit from acute OT to increase independence prior to d/c. Recommending HHOT upon d/c.    Follow Up Recommendations  Home health OT;Supervision/Assistance - 24 hour (24/7 at least initially)    Equipment Recommendations  None recommended by OT    Recommendations for Other Services       Precautions / Restrictions Precautions Precautions: Fall Restrictions Weight Bearing Restrictions: No      Mobility Bed Mobility               General bed mobility comments: not tested  Transfers Overall transfer level: Independent (stand to sit; did not observe sit to stand)                    Balance    Min guard for ambulation without use of RW. No LOB with use of RW while ambulating.                                        ADL either performed or assessed with clinical judgement   ADL Overall ADL's : Needs assistance/impaired                     Lower Body Dressing: Supervision/safety;Set up    Toilet Transfer: Supervision/safety;RW;Ambulation           Functional mobility during ADLs: Rolling walker;Min guard (Min guard without RW; Supervision with RW) General ADL Comments: Educated on energy conservation. Briefly discussed AE.     Vision         Perception     Praxis      Pertinent Vitals/Pain Pain Assessment: 0-10 Pain Score:  (5  in chest and 6-7 in back) Pain Location: back and chest Pain Descriptors / Indicators: Throbbing;Pressure Pain Intervention(s): Monitored during session     Hand Dominance     Extremity/Trunk Assessment Upper Extremity Assessment Upper Extremity Assessment: Generalized weakness (weakness in bilateral shoulder flexors; also has RA)   Lower Extremity Assessment Lower Extremity Assessment: Defer to PT evaluation       Communication Communication Communication: No difficulties   Cognition Arousal/Alertness: Awake/alert Behavior During Therapy: WFL for tasks assessed/performed Overall Cognitive Status: No family/caregiver present to determine baseline cognitive functioning (lost train of thought-decreased memory)                                     General Comments       Exercises     Shoulder Instructions      Home Living Family/patient expects to be discharged to:: Private residence Living Arrangements: Alone Available Help at Discharge: Family;Available 24 hours/day (intially) Type of Home: House Home Access: Stairs to enter [employer] of Steps: 1 Entrance Stairs-Rails: None Home Layout: Multi-level Alternate Level Stairs-Number of Steps: 3 Alternate Level Stairs-Rails: Right Bathroom Shower/Tub: Tub/shower unit  Bathroom Toilet: Standard     Home Equipment: Bedside commode;Walker - 4 wheels;Tub bench          Prior Functioning/Environment Level of Independence: Independent with assistive device(s)                 OT Problem List: Decreased strength;Decreased range of motion;Decreased activity tolerance;Impaired balance (sitting and/or standing);Decreased cognition;Decreased safety awareness;Decreased knowledge of precautions;Obesity;Pain      OT Treatment/Interventions: Self-care/ADL training;DME and/or AE instruction;Energy conservation;Therapeutic activities;Cognitive remediation/compensation;Patient/family  education;Balance training    OT Goals(Current goals can be found in the care plan section) Acute Rehab OT Goals Patient Stated Goal: go home; to swim OT Goal Formulation: With patient Time For Goal Achievement: 10/12/16 Potential to Achieve Goals: Good ADL Goals Pt Will Perform Lower Body Bathing: with modified independence;sit to/from stand Pt Will Perform Lower Body Dressing: with modified independence;sit to/from stand Pt Will Transfer to Toilet: with modified independence;ambulating;regular height toilet Additional ADL Goal #1: Pt will independently verbalize 3 energy conservation techniques and utilize in session as needed.  OT Frequency: Min 2X/week   Barriers to D/C:            Co-evaluation              AM-PAC PT "6 Clicks" Daily Activity     Outcome Measure Help from another person eating meals?: None Help from another person taking care of personal grooming?: A Little Help from another person toileting, which includes using toliet, bedpan, or urinal?: A Little Help from another person bathing (including washing, rinsing, drying)?: A Little Help from another person to put on and taking off regular upper body clothing?: A Little Help from another person to put on and taking off regular lower body clothing?: A Little 6 Click Score: 19   End of Session Equipment Utilized During Treatment: Rolling walker Nurse Communication: Other (comment) (asked tech to put chair alarm under pt)  Activity Tolerance: Patient tolerated treatment well Patient left: in chair;with call bell/phone within reach  OT Visit Diagnosis: Pain;Muscle weakness (generalized) (M62.81) Pain - Right/Left:  (back and chest) Pain - part of body:  (back and chest)                Time: 5009-3818 OT Time Calculation (min): 26 min Charges:  OT General Charges $OT Visit: 1 Procedure OT Evaluation $OT Eval Moderate Complexity: 1 Procedure G-Codes: OT G-codes **NOT FOR INPATIENT CLASS** Functional  Assessment Tool Used: Clinical judgement Functional Limitation: Self care Self Care Current Status (E9937): At least 1 percent but less than 20 percent impaired, limited or restricted Self Care Goal Status (J6967): 0 percent impaired, limited or restricted    Earlie Raveling OTR/L 10/05/2016, 10:35 AM

## 2016-10-05 NOTE — Progress Notes (Signed)
Pt w/ St Jude PPM. Device interrogated and reviewed with the rep. Pt has had occasional high atrial rate episodes, usually very brief. The last one was for 3 minutes on August 15th, atrial rate 202. No EGM available, but likely atrial tach. Will send message to arrange f/u in office. No acute issues.  [name], [name] 10/05/2016 10:50 AM Beeper [phone]

## 2016-10-08 DIAGNOSIS — Z79891 Long term (current) use of opiate analgesic: Secondary | ICD-10-CM | POA: Diagnosis not present

## 2016-10-08 DIAGNOSIS — I5033 Acute on chronic diastolic (congestive) heart failure: Secondary | ICD-10-CM | POA: Diagnosis not present

## 2016-10-08 DIAGNOSIS — Z8711 Personal history of peptic ulcer disease: Secondary | ICD-10-CM | POA: Diagnosis not present

## 2016-10-08 DIAGNOSIS — M069 Rheumatoid arthritis, unspecified: Secondary | ICD-10-CM | POA: Diagnosis not present

## 2016-10-08 DIAGNOSIS — I480 Paroxysmal atrial fibrillation: Secondary | ICD-10-CM | POA: Diagnosis not present

## 2016-10-08 DIAGNOSIS — Z950 Presence of cardiac pacemaker: Secondary | ICD-10-CM | POA: Diagnosis not present

## 2016-10-08 DIAGNOSIS — E119 Type 2 diabetes mellitus without complications: Secondary | ICD-10-CM | POA: Diagnosis not present

## 2016-10-08 DIAGNOSIS — Z7901 Long term (current) use of anticoagulants: Secondary | ICD-10-CM | POA: Diagnosis not present

## 2016-10-08 DIAGNOSIS — I110 Hypertensive heart disease with heart failure: Secondary | ICD-10-CM | POA: Diagnosis not present

## 2016-10-08 DIAGNOSIS — J449 Chronic obstructive pulmonary disease, unspecified: Secondary | ICD-10-CM | POA: Diagnosis not present

## 2016-10-08 DIAGNOSIS — I2510 Atherosclerotic heart disease of native coronary artery without angina pectoris: Secondary | ICD-10-CM | POA: Diagnosis not present

## 2016-10-08 DIAGNOSIS — M810 Age-related osteoporosis without current pathological fracture: Secondary | ICD-10-CM | POA: Diagnosis not present

## 2016-10-08 DIAGNOSIS — Z8673 Personal history of transient ischemic attack (TIA), and cerebral infarction without residual deficits: Secondary | ICD-10-CM | POA: Diagnosis not present

## 2016-10-09 DIAGNOSIS — M069 Rheumatoid arthritis, unspecified: Secondary | ICD-10-CM | POA: Diagnosis not present

## 2016-10-09 DIAGNOSIS — E119 Type 2 diabetes mellitus without complications: Secondary | ICD-10-CM | POA: Diagnosis not present

## 2016-10-09 DIAGNOSIS — Z79891 Long term (current) use of opiate analgesic: Secondary | ICD-10-CM | POA: Diagnosis not present

## 2016-10-09 DIAGNOSIS — I5033 Acute on chronic diastolic (congestive) heart failure: Secondary | ICD-10-CM | POA: Diagnosis not present

## 2016-10-09 DIAGNOSIS — Z950 Presence of cardiac pacemaker: Secondary | ICD-10-CM | POA: Diagnosis not present

## 2016-10-09 DIAGNOSIS — I2510 Atherosclerotic heart disease of native coronary artery without angina pectoris: Secondary | ICD-10-CM | POA: Diagnosis not present

## 2016-10-09 DIAGNOSIS — M810 Age-related osteoporosis without current pathological fracture: Secondary | ICD-10-CM | POA: Diagnosis not present

## 2016-10-09 DIAGNOSIS — Z7901 Long term (current) use of anticoagulants: Secondary | ICD-10-CM | POA: Diagnosis not present

## 2016-10-09 DIAGNOSIS — Z8711 Personal history of peptic ulcer disease: Secondary | ICD-10-CM | POA: Diagnosis not present

## 2016-10-09 DIAGNOSIS — J449 Chronic obstructive pulmonary disease, unspecified: Secondary | ICD-10-CM | POA: Diagnosis not present

## 2016-10-09 DIAGNOSIS — I480 Paroxysmal atrial fibrillation: Secondary | ICD-10-CM | POA: Diagnosis not present

## 2016-10-09 DIAGNOSIS — I110 Hypertensive heart disease with heart failure: Secondary | ICD-10-CM | POA: Diagnosis not present

## 2016-10-09 DIAGNOSIS — Z8673 Personal history of transient ischemic attack (TIA), and cerebral infarction without residual deficits: Secondary | ICD-10-CM | POA: Diagnosis not present

## 2016-10-11 DIAGNOSIS — Z8711 Personal history of peptic ulcer disease: Secondary | ICD-10-CM | POA: Diagnosis not present

## 2016-10-11 DIAGNOSIS — E119 Type 2 diabetes mellitus without complications: Secondary | ICD-10-CM | POA: Diagnosis not present

## 2016-10-11 DIAGNOSIS — M810 Age-related osteoporosis without current pathological fracture: Secondary | ICD-10-CM | POA: Diagnosis not present

## 2016-10-11 DIAGNOSIS — I2510 Atherosclerotic heart disease of native coronary artery without angina pectoris: Secondary | ICD-10-CM | POA: Diagnosis not present

## 2016-10-11 DIAGNOSIS — I5033 Acute on chronic diastolic (congestive) heart failure: Secondary | ICD-10-CM | POA: Diagnosis not present

## 2016-10-11 DIAGNOSIS — J449 Chronic obstructive pulmonary disease, unspecified: Secondary | ICD-10-CM | POA: Diagnosis not present

## 2016-10-11 DIAGNOSIS — Z7901 Long term (current) use of anticoagulants: Secondary | ICD-10-CM | POA: Diagnosis not present

## 2016-10-11 DIAGNOSIS — I110 Hypertensive heart disease with heart failure: Secondary | ICD-10-CM | POA: Diagnosis not present

## 2016-10-11 DIAGNOSIS — Z79891 Long term (current) use of opiate analgesic: Secondary | ICD-10-CM | POA: Diagnosis not present

## 2016-10-11 DIAGNOSIS — Z950 Presence of cardiac pacemaker: Secondary | ICD-10-CM | POA: Diagnosis not present

## 2016-10-11 DIAGNOSIS — M069 Rheumatoid arthritis, unspecified: Secondary | ICD-10-CM | POA: Diagnosis not present

## 2016-10-11 DIAGNOSIS — I480 Paroxysmal atrial fibrillation: Secondary | ICD-10-CM | POA: Diagnosis not present

## 2016-10-11 DIAGNOSIS — Z8673 Personal history of transient ischemic attack (TIA), and cerebral infarction without residual deficits: Secondary | ICD-10-CM | POA: Diagnosis not present

## 2016-10-14 DIAGNOSIS — Z950 Presence of cardiac pacemaker: Secondary | ICD-10-CM | POA: Diagnosis not present

## 2016-10-14 DIAGNOSIS — J449 Chronic obstructive pulmonary disease, unspecified: Secondary | ICD-10-CM | POA: Diagnosis not present

## 2016-10-14 DIAGNOSIS — I5033 Acute on chronic diastolic (congestive) heart failure: Secondary | ICD-10-CM | POA: Diagnosis not present

## 2016-10-14 DIAGNOSIS — I2510 Atherosclerotic heart disease of native coronary artery without angina pectoris: Secondary | ICD-10-CM | POA: Diagnosis not present

## 2016-10-14 DIAGNOSIS — M069 Rheumatoid arthritis, unspecified: Secondary | ICD-10-CM | POA: Diagnosis not present

## 2016-10-14 DIAGNOSIS — Z79891 Long term (current) use of opiate analgesic: Secondary | ICD-10-CM | POA: Diagnosis not present

## 2016-10-14 DIAGNOSIS — Z7901 Long term (current) use of anticoagulants: Secondary | ICD-10-CM | POA: Diagnosis not present

## 2016-10-14 DIAGNOSIS — I480 Paroxysmal atrial fibrillation: Secondary | ICD-10-CM | POA: Diagnosis not present

## 2016-10-14 DIAGNOSIS — M810 Age-related osteoporosis without current pathological fracture: Secondary | ICD-10-CM | POA: Diagnosis not present

## 2016-10-14 DIAGNOSIS — Z8673 Personal history of transient ischemic attack (TIA), and cerebral infarction without residual deficits: Secondary | ICD-10-CM | POA: Diagnosis not present

## 2016-10-14 DIAGNOSIS — Z8711 Personal history of peptic ulcer disease: Secondary | ICD-10-CM | POA: Diagnosis not present

## 2016-10-14 DIAGNOSIS — E119 Type 2 diabetes mellitus without complications: Secondary | ICD-10-CM | POA: Diagnosis not present

## 2016-10-14 DIAGNOSIS — I110 Hypertensive heart disease with heart failure: Secondary | ICD-10-CM | POA: Diagnosis not present

## 2016-10-16 DIAGNOSIS — I110 Hypertensive heart disease with heart failure: Secondary | ICD-10-CM | POA: Diagnosis not present

## 2016-10-16 DIAGNOSIS — Z79891 Long term (current) use of opiate analgesic: Secondary | ICD-10-CM | POA: Diagnosis not present

## 2016-10-16 DIAGNOSIS — M810 Age-related osteoporosis without current pathological fracture: Secondary | ICD-10-CM | POA: Diagnosis not present

## 2016-10-16 DIAGNOSIS — Z950 Presence of cardiac pacemaker: Secondary | ICD-10-CM | POA: Diagnosis not present

## 2016-10-16 DIAGNOSIS — Z8673 Personal history of transient ischemic attack (TIA), and cerebral infarction without residual deficits: Secondary | ICD-10-CM | POA: Diagnosis not present

## 2016-10-16 DIAGNOSIS — J449 Chronic obstructive pulmonary disease, unspecified: Secondary | ICD-10-CM | POA: Diagnosis not present

## 2016-10-16 DIAGNOSIS — M069 Rheumatoid arthritis, unspecified: Secondary | ICD-10-CM | POA: Diagnosis not present

## 2016-10-16 DIAGNOSIS — E119 Type 2 diabetes mellitus without complications: Secondary | ICD-10-CM | POA: Diagnosis not present

## 2016-10-16 DIAGNOSIS — Z7901 Long term (current) use of anticoagulants: Secondary | ICD-10-CM | POA: Diagnosis not present

## 2016-10-16 DIAGNOSIS — Z8711 Personal history of peptic ulcer disease: Secondary | ICD-10-CM | POA: Diagnosis not present

## 2016-10-16 DIAGNOSIS — I5033 Acute on chronic diastolic (congestive) heart failure: Secondary | ICD-10-CM | POA: Diagnosis not present

## 2016-10-16 DIAGNOSIS — I2510 Atherosclerotic heart disease of native coronary artery without angina pectoris: Secondary | ICD-10-CM | POA: Diagnosis not present

## 2016-10-16 DIAGNOSIS — I480 Paroxysmal atrial fibrillation: Secondary | ICD-10-CM | POA: Diagnosis not present

## 2016-10-25 ENCOUNTER — Ambulatory Visit: Payer: Medicare Other | Admitting: Nurse Practitioner

## 2016-10-28 ENCOUNTER — Other Ambulatory Visit: Payer: Self-pay | Admitting: Physician Assistant

## 2016-10-28 DIAGNOSIS — I482 Chronic atrial fibrillation, unspecified: Secondary | ICD-10-CM

## 2016-10-28 DIAGNOSIS — I2510 Atherosclerotic heart disease of native coronary artery without angina pectoris: Secondary | ICD-10-CM

## 2016-10-28 DIAGNOSIS — I2583 Coronary atherosclerosis due to lipid rich plaque: Principal | ICD-10-CM

## 2016-10-28 DIAGNOSIS — I10 Essential (primary) hypertension: Secondary | ICD-10-CM

## 2016-10-28 DIAGNOSIS — Z950 Presence of cardiac pacemaker: Secondary | ICD-10-CM

## 2016-11-05 ENCOUNTER — Ambulatory Visit (INDEPENDENT_AMBULATORY_CARE_PROVIDER_SITE_OTHER): Payer: Medicare Other | Admitting: Internal Medicine

## 2016-11-05 ENCOUNTER — Encounter: Payer: Self-pay | Admitting: Internal Medicine

## 2016-11-05 VITALS — BP 152/100 | HR 81 | Ht 61.0 in | Wt 243.4 lb

## 2016-11-05 DIAGNOSIS — I442 Atrioventricular block, complete: Secondary | ICD-10-CM | POA: Diagnosis not present

## 2016-11-05 DIAGNOSIS — I10 Essential (primary) hypertension: Secondary | ICD-10-CM | POA: Diagnosis not present

## 2016-11-05 DIAGNOSIS — I480 Paroxysmal atrial fibrillation: Secondary | ICD-10-CM | POA: Diagnosis not present

## 2016-11-05 LAB — CUP PACEART INCLINIC DEVICE CHECK
Battery Impedance: 7800 Ohm
Battery Voltage: 2.66 V
Brady Statistic RA Percent Paced: 23 %
Date Time Interrogation Session: 20180925104657
Implantable Pulse Generator Implant Date: 20090521
Lead Channel Impedance Value: 215 Ohm
Lead Channel Impedance Value: 273 Ohm
Lead Channel Pacing Threshold Amplitude: 0.5 V
Lead Channel Pacing Threshold Pulse Width: 0.6 ms
Lead Channel Sensing Intrinsic Amplitude: 1.4 mV
Lead Channel Setting Pacing Amplitude: 2 V
MDC IDC LEAD IMPLANT DT: 20090521
MDC IDC LEAD IMPLANT DT: 20090521
MDC IDC LEAD LOCATION: 753859
MDC IDC LEAD LOCATION: 753860
MDC IDC MSMT LEADCHNL RA PACING THRESHOLD PULSEWIDTH: 0.6 ms
MDC IDC MSMT LEADCHNL RV PACING THRESHOLD AMPLITUDE: 1 V
MDC IDC MSMT LEADCHNL RV SENSING INTR AMPL: 8 mV
MDC IDC SET LEADCHNL RV PACING PULSEWIDTH: 0.6 ms
MDC IDC SET LEADCHNL RV SENSING SENSITIVITY: 2.5 mV
MDC IDC STAT BRADY RV PERCENT PACED: 89 %
Pulse Gen Model: 5816
Pulse Gen Serial Number: 1227916
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
## 2016-11-05 NOTE — Patient Instructions (Addendum)
Medication Instructions:  Your physician recommends that you continue on your current medications as directed. Please refer to the Current Medication list given to you today.  Labwork: None ordered.  Testing/Procedures: None ordered.  Follow-Up:  You will follow up in 6 months with the device clinic for a device check.  Your physician wants you to follow-up in: one year with Dr. Ladona Ridgel.   You will receive a reminder letter in the mail two months in advance. If you don't receive a letter, please call our office to schedule the follow-up appointment.   Any Other Special Instructions Will Be Listed Below (If Applicable).    If you need a refill on your cardiac medications before your next appointment, please call your pharmacy.

## 2016-12-05 ENCOUNTER — Other Ambulatory Visit: Payer: Self-pay

## 2016-12-05 ENCOUNTER — Encounter: Payer: Self-pay | Admitting: *Deleted

## 2016-12-06 ENCOUNTER — Other Ambulatory Visit: Payer: Self-pay | Admitting: *Deleted

## 2016-12-06 NOTE — Telephone Encounter (Signed)
This encounter was created in error - please disregard.

## 2016-12-10 ENCOUNTER — Encounter: Payer: Self-pay | Admitting: *Deleted

## 2016-12-10 NOTE — Patient Outreach (Signed)
[facility]) Care Management  12/06/16  Katherine Cervantes [birth year] [id number]   Telephone Screen  Referral Date: 12/05/16 Referral Source: EMMI Prevent Referral Referral Reason: DM, Heart Failure, Atrial Fibrillation,  Insurance: [facility] Medicare  Outreach attempt to patient. HIPAA verified with patient. Patient confirmed, she was in the hospital in from 10/03/16 to 10/05/16 with heart failure. Patient described her medical history. She was diagnosed with autoimmune disorder (Rheumatoid Arthritis) and Vasculitis. She is prescribed Methotrexate for both diagnoses. She reported having hypoglycemia, instead of DM. She was diagnosed with DM, until she changed her diet to include more protein, vegetables, and green leafy products, per patient. Patient reported a weight of 218 pounds. She stated, she doesn't weigh daily or monitor her BG regularly. Patient explained, "She's getting tired more easily, lately". She doesn't know if she has gained weight. She is starting to exercise less, from twice a week to possibly weekly. She stated, "It depends on how I feel". She reported, having Cellulitis during her last hospital admission. She stated, she waited too long toseek treatment. Per patient, her legs were weeping excessively. Patient discussed, she didn't have a choice but to seek treatment. She was weak and unable to walk. She reported, "She has come a long way since her last illness". Her next primary MD appointment is in November 2018.    Plan: RN CM advised patient to contact RNCM for any needs or concerns. RN CM will send referral to [facility] Health Coach for further education and support in managing Heart Failure. RN CM will send patient information regarding Advanced Directives.    Wynelle Cleveland, RN, BSN, MHA/MSL, [facility] Telephonic Care Manager Coordinator [facility] Direct Phone: [phone] Toll Free: [phone] Fax: [phone]

## 2016-12-11 ENCOUNTER — Other Ambulatory Visit: Payer: Self-pay

## 2016-12-11 ENCOUNTER — Encounter: Payer: Self-pay | Admitting: *Deleted

## 2016-12-11 NOTE — Telephone Encounter (Signed)
This encounter was created in error - please disregard.

## 2016-12-11 NOTE — Patient Outreach (Signed)
[facility]) Care Management  12/11/2016  Makhayla Mcmurry 02-Oct-1929 [id number]   RN Health Coach  Spoke with the member and HIPAA verified. RN Health Coach discussed [facility] services with the member.  The member states that she is interested in the services but would like to verify who I am and the company I am with.  She said that there are so many scams going on she would like to be sure.  RN Health Coach gave the patient two numbers for verification.  She stated that she will call me back.  Plan:  RN Health Coach will reconnect with the member in three business days.  Juanell Fairly RN, BSN, [facility] RN Health Coach Disease Management [facility] Dial:  [phone] Fax: [phone]

## 2016-12-16 ENCOUNTER — Other Ambulatory Visit: Payer: Self-pay

## 2016-12-16 NOTE — Patient Outreach (Signed)
[facility]) Care Management  12/16/2016  Katherine Cervantes February 19, 1929 [id number]   Rn Health Coach spoke with the patient. HIPAA verified. Discussed and offered [facility] care management services with patient. The patient stated at this time she does not feel that she needs any education for her condition.   Plan: RN Health Coach will notify [facility] administrative assistant of case closure.   Juanell Fairly RN, BSN, [facility] RN Health Coach Disease Management [facility] Dial:  [phone] Fax: [phone]

## 2016-12-17 NOTE — Progress Notes (Deleted)
HPI: FU atrial fibrillation/flutter. Patient has had previous care in [place]. Previous cath but no records available. Previous atrial flutter ablation and pacemaker. Nuclear study September 2011 showed ejection fraction 76% and no ischemia or infarction. Carotid dopplers 3/17 showed 1-39 bilateral stenosis. She has chronic chest tightness with exertion. Admitted with diastolic congestive heart failure August 2018 and improved with diuresis. Echocardiogram August 2018 showed normal LV function, restrictive filling, mild LAE. Since last seen,  Current Outpatient Medications  Medication Sig Dispense Refill  . furosemide (LASIX) 40 MG tablet TAKE 1-1&1/2 TABLETS BY MOUTH 2 TIMES A DAY TO HELP WITH SWELLING 180 tablet 2  . lisinopril (PRINIVIL,ZESTRIL) 10 MG tablet Take 1 tablet (10 mg total) by mouth daily. 90 tablet 0  . metoprolol succinate (TOPROL-XL) 100 MG 24 hr tablet Take 1 tablet (100 mg total) by mouth daily. Due for follow up visit 30 tablet 0  . nitroGLYCERIN (NITROSTAT) 0.4 MG SL tablet Place 1 tablet (0.4 mg total) under the tongue every 5 (five) minutes as needed for chest pain (MAX 3 TABLETS). 25 tablet 3  . simvastatin (ZOCOR) 20 MG tablet Take 1 tablet (20 mg total) by mouth at bedtime. Due for follow up visit 30 tablet 0  . traMADol-acetaminophen (ULTRACET) 37.5-325 MG tablet TAKE ONE TABLET BY MOUTH EVERY 8 HOURS AS NEEDED FOR ARTHRITIS PAIN 45 tablet 0  . warfarin (COUMADIN) 3 MG tablet Take 3 mg by mouth daily.     No current facility-administered medications for this visit.      Past Medical History:  Diagnosis Date  . Asthma   . Atrial fibrillation (HCC) 09/27/2011  . CAD (coronary artery disease) 09/27/2011  . Diabetes mellitus without complication (HCC)   . Dyslipidemia 09/27/2011  . History of vasculitis 09/27/2011  . Hypertension 09/27/2011  . Osteoporosis 09/27/2011  . Pacemaker   . PUD (peptic ulcer disease)   . Rheumatoid arthritis(714.0) 09/27/2011     Past Surgical History:  Procedure Laterality Date  . ABDOMINAL HYSTERECTOMY    . ANKLE SURGERY    . APPENDECTOMY    . Carpal tunnel    . CHOLECYSTECTOMY    . PARTIAL GASTRECTOMY      Social History   Socioeconomic History  . Marital status: Widowed    Spouse name: Not on file  . Number of children: 7  . Years of education: Not on file  . Highest education level: Not on file  Social Needs  . Financial resource strain: Not on file  . Food insecurity - worry: Not on file  . Food insecurity - inability: Not on file  . Transportation needs - medical: Not on file  . Transportation needs - non-medical: Not on file  Occupational History  . Not on file  Tobacco Use  . Smoking status: Never Smoker  . Smokeless tobacco: Never Used  Substance and Sexual Activity  . Alcohol use: Yes    Alcohol/week: 0.0 oz    Comment: Occasional  . Drug use: No  . Sexual activity: Not on file  Other Topics Concern  . Not on file  Social History Narrative  . Not on file    Family History  Problem Relation Age of Onset  . Hypertension Mother   . Cancer Brother   . Cancer Brother     ROS: no fevers or chills, productive cough, hemoptysis, dysphasia, odynophagia, melena, hematochezia, dysuria, hematuria, rash, seizure activity, orthopnea, PND, pedal edema, claudication. Remaining systems are negative.  Physical Exam:  Well-developed well-nourished in no acute distress.  Skin is warm and dry.  HEENT is normal.  Neck is supple.  Chest is clear to auscultation with normal expansion.  Cardiovascular exam is regular rate and rhythm.  Abdominal exam nontender or distended. No masses palpated. Extremities show no edema. neuro grossly intact  ECG- personally reviewed  A/P  1  Katherine Millers, MD

## 2016-12-19 ENCOUNTER — Other Ambulatory Visit: Payer: Self-pay | Admitting: Physician Assistant

## 2016-12-19 DIAGNOSIS — Z950 Presence of cardiac pacemaker: Secondary | ICD-10-CM

## 2016-12-19 DIAGNOSIS — I10 Essential (primary) hypertension: Secondary | ICD-10-CM

## 2016-12-19 DIAGNOSIS — I482 Chronic atrial fibrillation, unspecified: Secondary | ICD-10-CM

## 2016-12-19 DIAGNOSIS — I2510 Atherosclerotic heart disease of native coronary artery without angina pectoris: Secondary | ICD-10-CM

## 2016-12-19 DIAGNOSIS — I2583 Coronary atherosclerosis due to lipid rich plaque: Secondary | ICD-10-CM

## 2016-12-27 ENCOUNTER — Ambulatory Visit: Payer: Medicare Other | Admitting: Cardiology

## 2017-01-23 ENCOUNTER — Other Ambulatory Visit: Payer: Self-pay | Admitting: Physician Assistant

## 2017-01-23 DIAGNOSIS — I10 Essential (primary) hypertension: Secondary | ICD-10-CM

## 2017-01-23 DIAGNOSIS — I2583 Coronary atherosclerosis due to lipid rich plaque: Secondary | ICD-10-CM

## 2017-01-23 DIAGNOSIS — Z950 Presence of cardiac pacemaker: Secondary | ICD-10-CM

## 2017-01-23 DIAGNOSIS — I482 Chronic atrial fibrillation, unspecified: Secondary | ICD-10-CM

## 2017-01-23 DIAGNOSIS — I2510 Atherosclerotic heart disease of native coronary artery without angina pectoris: Secondary | ICD-10-CM

## 2017-02-22 IMAGING — US US SOFT TISSUE HEAD/NECK
1 series · 14 of 25 positions shown · non-contrast
Comparison: None.

CLINICAL DATA: Palpable mass of the left neck superior to the
clavicle.

EXAM:
ULTRASOUND OF HEAD/NECK SOFT TISSUES
TECHNIQUE: Ultrasound examination of the head and neck soft tissues was
performed in the area of clinical concern.

[Series 1: us soft tissue head/neck · 0.07mm/px · 14 of 25 slices shown]
[im 1/25]
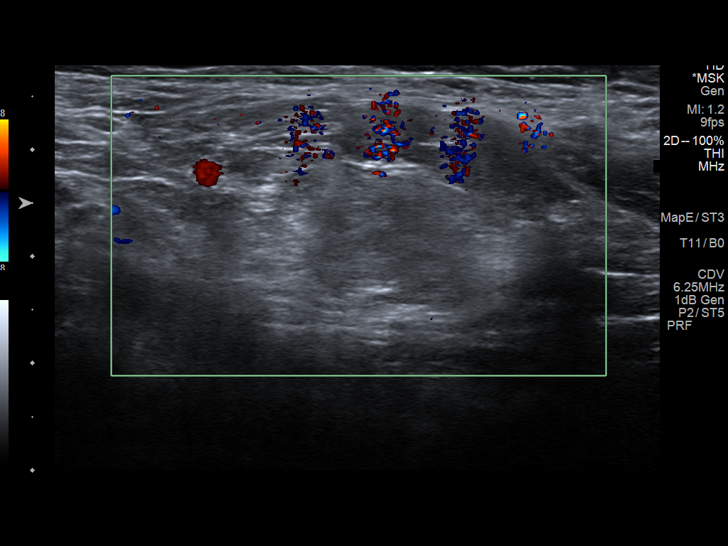
[im 3/25]
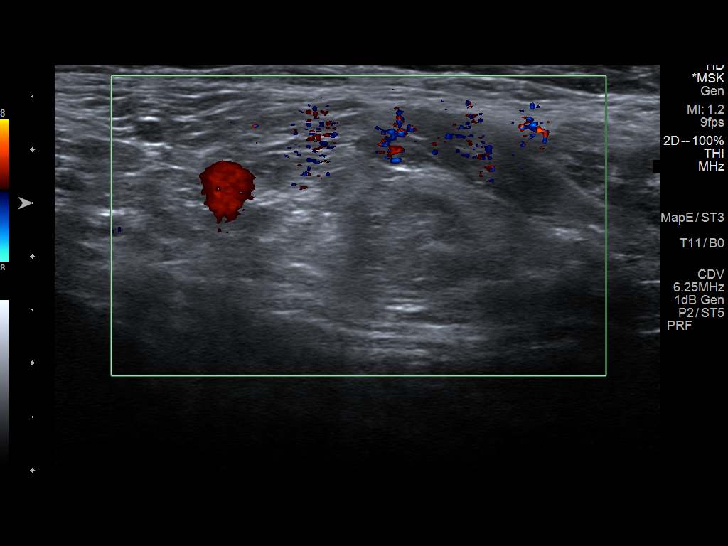
[im 5/25]
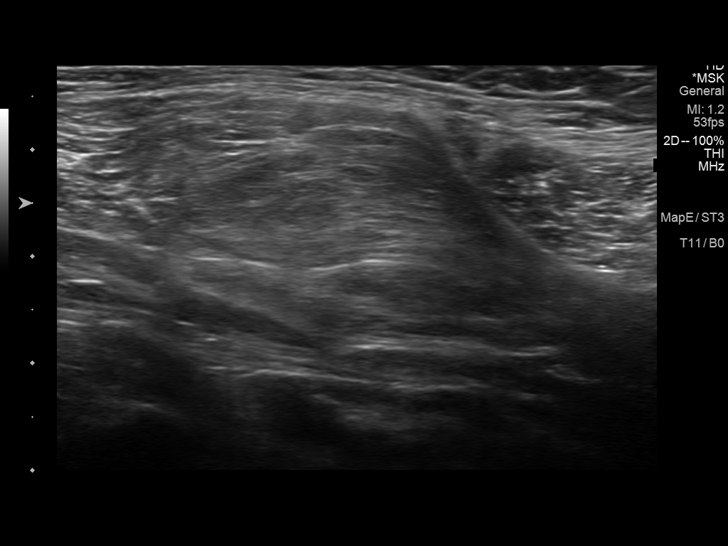
[im 7/25]
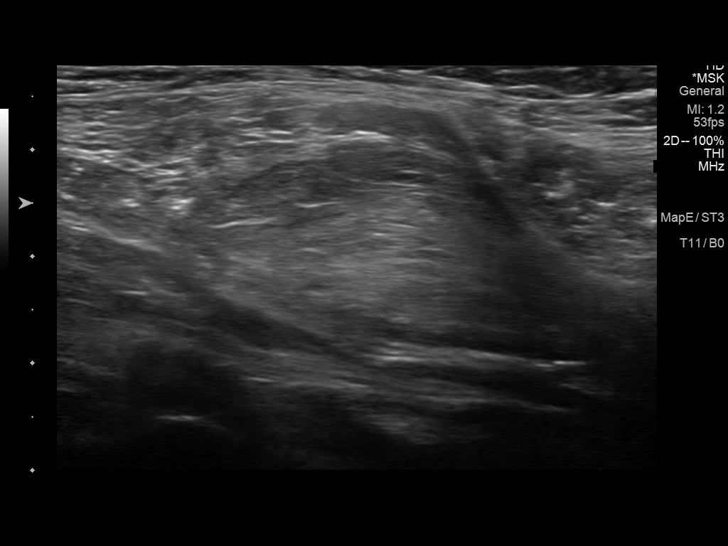
[im 9/25]
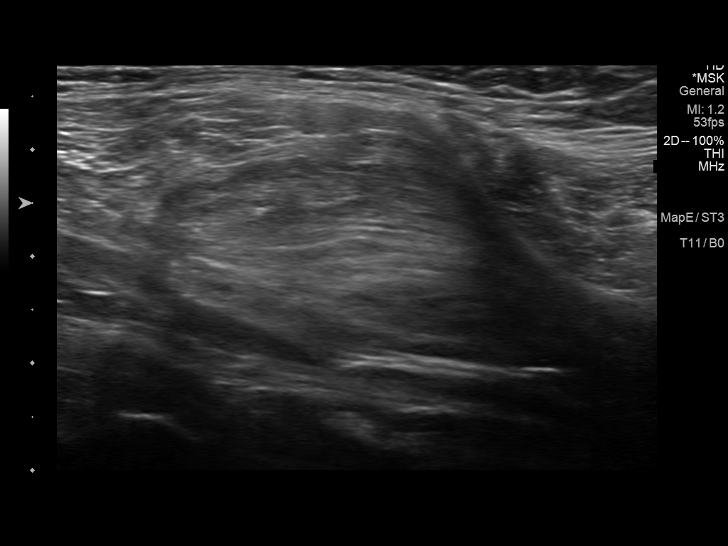
[im 10/25]
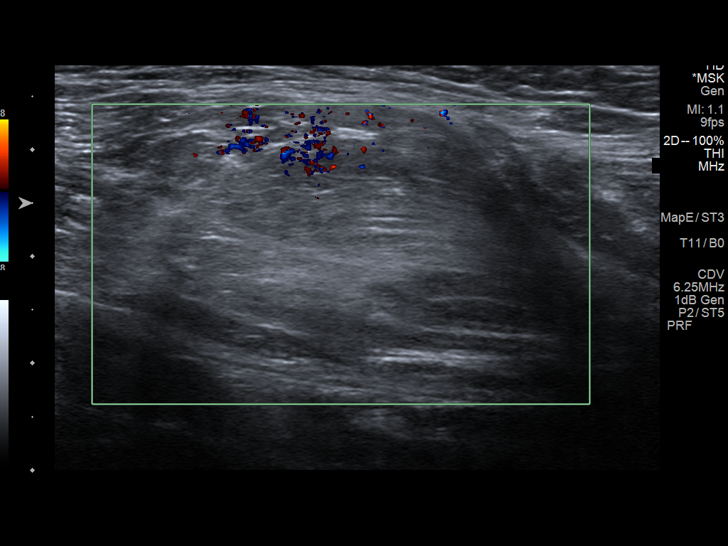
[im 12/25]
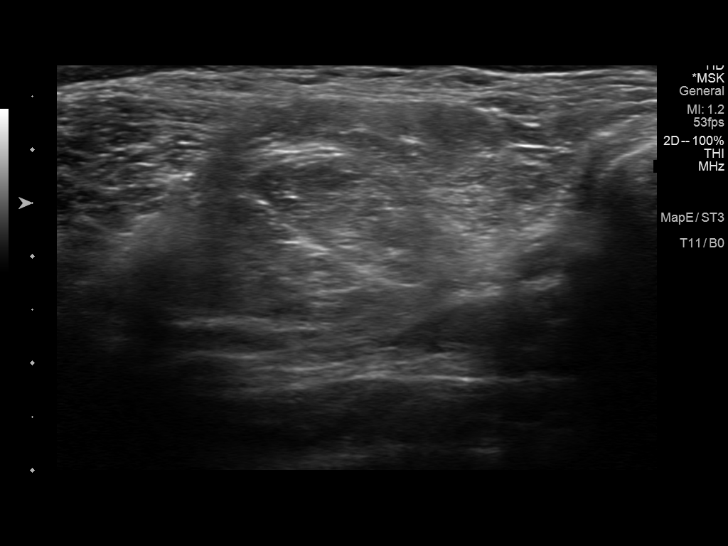
[im 14/25]
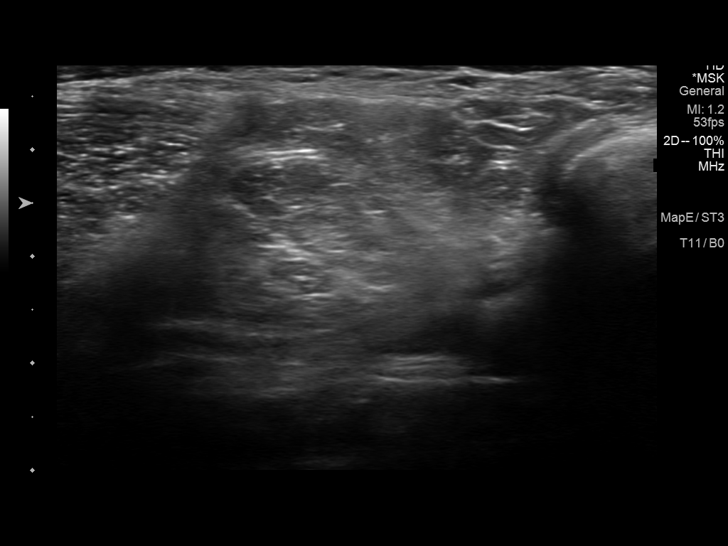
[im 16/25]
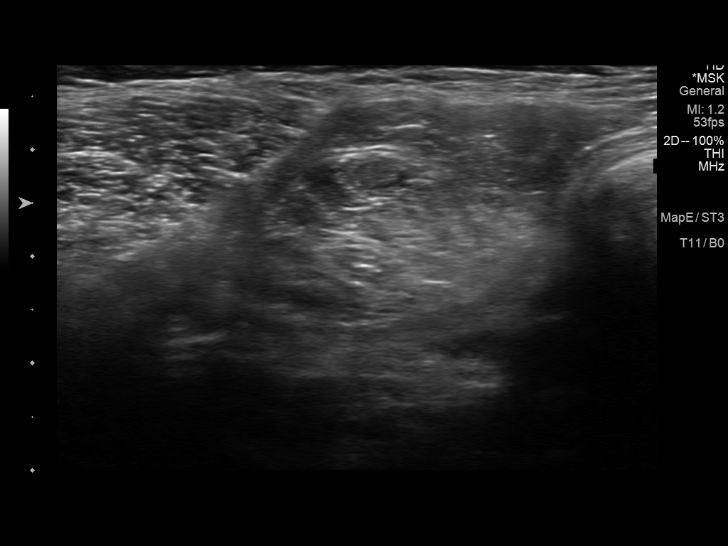
[im 17/25]
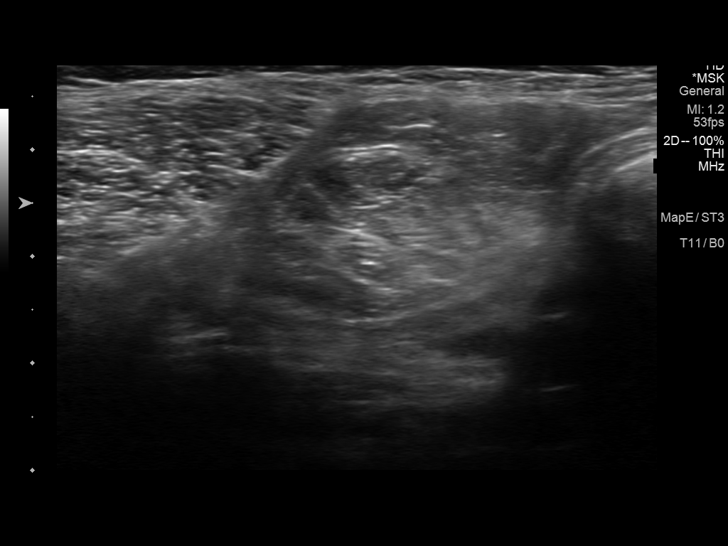
[im 19/25]
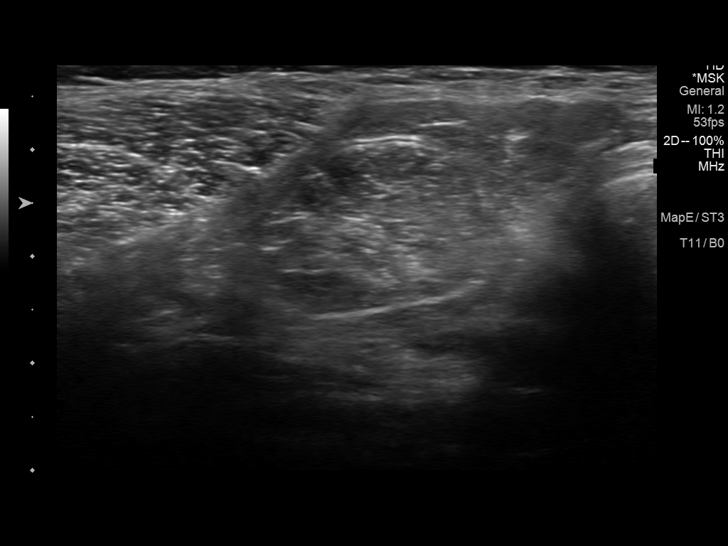
[im 21/25]
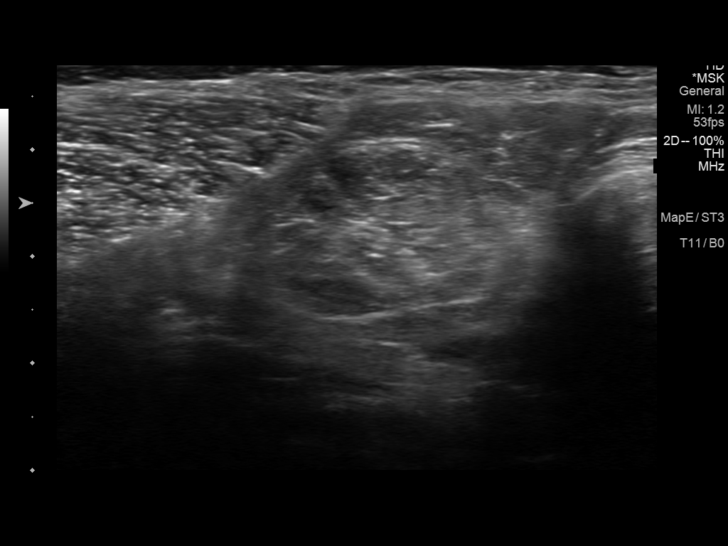
[im 23/25]
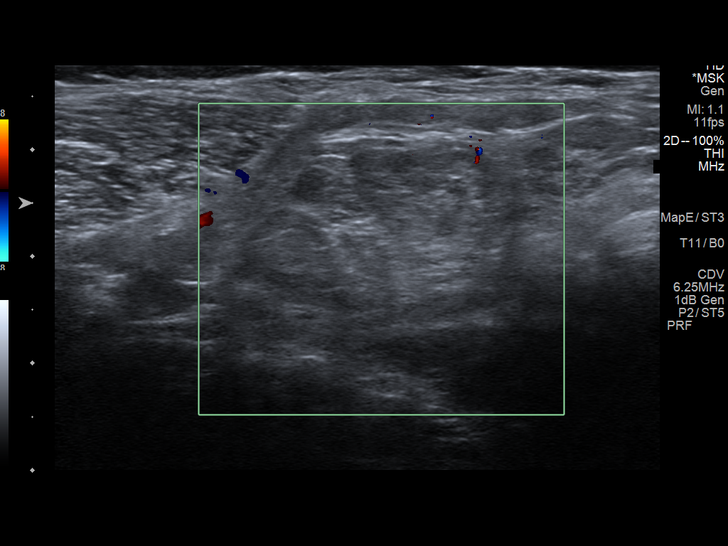
[im 25/25]
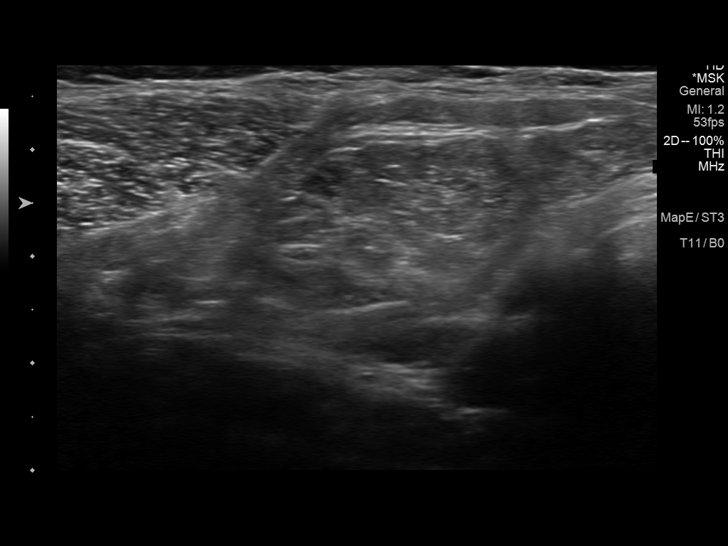

[14 of 25 positions shown; findings below may reference images not displayed]

FINDINGS: Soft tissue ultrasound in the region of clinically palpable
abnormality shows a subtle nodular area of margination in the
subcutaneous fat which could represent a subtle lipoma. This region
measures roughly on the order of 2.5 cm in diameter. No discrete
soft tissue mass, enlarged lymph node or fluid collection is
identified. No vascular abnormalities are seen.
IMPRESSION: Soft tissue ultrasound in the left supraclavicular region shows
vaguely delineated rounded area in the subcutaneous fat. This could
potentially represent a lipoma.

## 2017-03-10 NOTE — Progress Notes (Signed)
HPI: FU atrial fibrillation/flutter. Patient has had previous care in [place]. Previous cath but no records available. Previous atrial flutter ablation and pacemaker. Nuclear study September 2011 showed ejection fraction 76% and no ischemia or infarction. Carotid dopplers 3/17 showed 1-39 bilateral stenosis. Echo 8/18 showed EF 50-55, mild LAE. Since last seen,  she has occasional dyspnea.  Occasional chest tightness that has been intermittent for years and unchanged.  No palpitations or syncope.  Chronic pedal edema. Chronic pedal edema.  Current Outpatient Medications  Medication Sig Dispense Refill  . furosemide (LASIX) 40 MG tablet TAKE 1-1&1/2 TABLETS BY MOUTH 2 TIMES A DAY TO HELP WITH SWELLING 180 tablet 2  . metoprolol succinate (TOPROL-XL) 100 MG 24 hr tablet Take 1 tablet (100 mg total) daily by mouth. MUST make follow up visit for future refills. 15 tablet 0  . nitroGLYCERIN (NITROSTAT) 0.4 MG SL tablet Place 1 tablet (0.4 mg total) under the tongue every 5 (five) minutes as needed for chest pain (MAX 3 TABLETS). 25 tablet 3  . simvastatin (ZOCOR) 20 MG tablet Take 1 tablet (20 mg total) at bedtime by mouth. MUST make appointment for future refills. 15 tablet 0  . traMADol-acetaminophen (ULTRACET) 37.5-325 MG tablet TAKE ONE TABLET BY MOUTH EVERY 8 HOURS AS NEEDED FOR ARTHRITIS PAIN 45 tablet 0   No current facility-administered medications for this visit.      Past Medical History:  Diagnosis Date  . Asthma   . Atrial fibrillation (HCC) 09/27/2011  . CAD (coronary artery disease) 09/27/2011  . Diabetes mellitus without complication (HCC)   . Dyslipidemia 09/27/2011  . History of vasculitis 09/27/2011  . Hypertension 09/27/2011  . Osteoporosis 09/27/2011  . Pacemaker   . PUD (peptic ulcer disease)   . Rheumatoid arthritis(714.0) 09/27/2011    Past Surgical History:  Procedure Laterality Date  . ABDOMINAL HYSTERECTOMY    . ANKLE SURGERY    . APPENDECTOMY    . Carpal  tunnel    . CHOLECYSTECTOMY    . PARTIAL GASTRECTOMY      Social History   Socioeconomic History  . Marital status: Widowed    Spouse name: Not on file  . Number of children: 7  . Years of education: Not on file  . Highest education level: Not on file  Social Needs  . Financial resource strain: Not on file  . Food insecurity - worry: Not on file  . Food insecurity - inability: Not on file  . Transportation needs - medical: Not on file  . Transportation needs - non-medical: Not on file  Occupational History  . Not on file  Tobacco Use  . Smoking status: Never Smoker  . Smokeless tobacco: Never Used  Substance and Sexual Activity  . Alcohol use: Yes    Alcohol/week: 0.0 oz    Comment: Occasional  . Drug use: No  . Sexual activity: Not on file  Other Topics Concern  . Not on file  Social History Narrative  . Not on file    Family History  Problem Relation Age of Onset  . Hypertension Mother   . Cancer Brother   . Cancer Brother     ROS: no fevers or chills, productive cough, hemoptysis, dysphasia, odynophagia, melena, hematochezia, dysuria, hematuria, rash, seizure activity, orthopnea, PND, claudication. Remaining systems are negative.  Physical Exam: Well-developed obese in no acute distress.  Skin is warm and dry.  HEENT is normal.  Neck is supple.  Chest is clear to auscultation  with normal expansion.  Cardiovascular exam is regular rate and rhythm.  Abdominal exam nontender or distended. No masses palpated. Extremities show 1+ edema. neuro grossly intact   A/P  1 paroxysmal atrial fibrillation-patient in sinus rhythm today.  Continue Toprol for rate control if atrial fibrillation recurs.  She discontinued her Coumadin on her own.  She is willing to take apixaban.  Begin 5 mg twice daily.  Check potassium, renal function and hemoglobin in 4 weeks.  2 prior pacemaker-followed by Dr. Ladona Ridgel.  3 hypertension-blood pressure is elevated. Resume lisinopril 10  mg daily and follow.  Advance regimen as needed.  4 coronary artery disease-history is unclear.  Her symptoms of chest pain are unchanged.  No further ischemia evaluation at this point.  5 carotid artery disease-mild on most recent study.  Plan medical therapy.  6 lower extremity edema-continue present dose of diuretic.  Olga Millers, MD

## 2017-03-14 ENCOUNTER — Telehealth: Payer: Self-pay | Admitting: Cardiology

## 2017-03-14 NOTE — Telephone Encounter (Signed)
error 

## 2017-03-18 ENCOUNTER — Ambulatory Visit: Payer: Medicare Other | Admitting: Cardiology

## 2017-03-18 ENCOUNTER — Encounter: Payer: Self-pay | Admitting: Cardiology

## 2017-03-18 VITALS — BP 180/90 | HR 60 | Ht 60.0 in | Wt 243.6 lb

## 2017-03-18 DIAGNOSIS — I480 Paroxysmal atrial fibrillation: Secondary | ICD-10-CM

## 2017-03-18 DIAGNOSIS — I2510 Atherosclerotic heart disease of native coronary artery without angina pectoris: Secondary | ICD-10-CM | POA: Diagnosis not present

## 2017-03-18 DIAGNOSIS — I10 Essential (primary) hypertension: Secondary | ICD-10-CM

## 2017-03-18 MED ORDER — APIXABAN 5 MG PO TABS: 5.0000 mg | ORAL_TABLET | Freq: Two times a day (BID) | ORAL | 6 refills | Status: DC

## 2017-03-18 MED ORDER — LISINOPRIL 10 MG PO TABS: 10.0000 mg | ORAL_TABLET | Freq: Every day | ORAL | 3 refills | Status: DC

## 2017-03-18 NOTE — Patient Instructions (Signed)
Medication Instructions:   RESTART LISINOPRIL 10 MG ONCE DAILY  START ELIQUIS 5 MG ONE TABLET TWICE DAILY  Labwork:  Your physician recommends that you return for lab work in: 4 WEEKS  Follow-Up:  Your physician wants you to follow-up in: 6 MONTHS WITH DR [name] You will receive a reminder letter in the mail two months in advance. If you don't receive a letter, please call our office to schedule the follow-up appointment.   If you need a refill on your cardiac medications before your next appointment, please call your pharmacy.

## 2017-03-19 ENCOUNTER — Encounter: Payer: Self-pay | Admitting: *Deleted

## 2017-03-19 LAB — CBC
HEMATOCRIT: 41.8 % (ref 34.0–46.6)
Hemoglobin: 13.7 g/dL (ref 11.1–15.9)
MCH: 30.3 pg (ref 26.6–33.0)
MCHC: 32.8 g/dL (ref 31.5–35.7)
MCV: 93 fL (ref 79–97)
Platelets: 247 10*3/uL (ref 150–379)
RBC: 4.52 x10E6/uL (ref 3.77–5.28)
RDW: 14.5 % (ref 12.3–15.4)
WBC: 7.5 10*3/uL (ref 3.4–10.8)

## 2017-03-19 LAB — BASIC METABOLIC PANEL
BUN / CREAT RATIO: 17 (ref 12–28)
BUN: 17 mg/dL (ref 8–27)
CO2: 24 mmol/L (ref 20–29)
Calcium: 9.7 mg/dL (ref 8.7–10.3)
Chloride: 102 mmol/L (ref 96–106)
Creatinine, Ser: 1 mg/dL (ref 0.57–1.00)
GFR calc Af Amer: 59 mL/min/{1.73_m2} — ABNORMAL LOW (ref >59)
GFR, EST NON AFRICAN AMERICAN: 51 mL/min/{1.73_m2} — AB (ref >59)
Glucose: 121 mg/dL — ABNORMAL HIGH (ref 65–99)
POTASSIUM: 4 mmol/L (ref 3.5–5.2)
SODIUM: 143 mmol/L (ref 134–144)

## 2017-09-16 ENCOUNTER — Telehealth: Payer: Self-pay | Admitting: Physician Assistant

## 2017-09-16 NOTE — Telephone Encounter (Signed)
Ok with me 

## 2017-09-16 NOTE — Telephone Encounter (Signed)
Pt called and she wants to schedule her CPE and switch her pcp from [facility] to Dr.Alexander. She use to see Hommel when he was here and states she was put with [name] but feels like she connects with Dr.Alexander. Is this okay for the switch and to schedule her CPE?  Thanks

## 2017-09-17 NOTE — Telephone Encounter (Signed)
Fine with me too 

## 2017-10-23 NOTE — Telephone Encounter (Signed)
I called pt tp schedule her Physical and let her know it is okay to switch her pcp to Dr.Alexander but I did not get an answer

## 2017-10-27 ENCOUNTER — Emergency Department (INDEPENDENT_AMBULATORY_CARE_PROVIDER_SITE_OTHER)
Admission: EM | Admit: 2017-10-27 | Discharge: 2017-10-27 | Disposition: A | Discharge status: Home / Self Care | Payer: Medicare Other | Source: Home / Self Care | Attending: Family Medicine | Admitting: Family Medicine

## 2017-10-27 ENCOUNTER — Encounter: Payer: Self-pay | Admitting: Emergency Medicine

## 2017-10-27 ENCOUNTER — Emergency Department (INDEPENDENT_AMBULATORY_CARE_PROVIDER_SITE_OTHER): Payer: Medicare Other

## 2017-10-27 DIAGNOSIS — R600 Localized edema: Secondary | ICD-10-CM | POA: Diagnosis not present

## 2017-10-27 DIAGNOSIS — R05 Cough: Secondary | ICD-10-CM

## 2017-10-27 DIAGNOSIS — L03115 Cellulitis of right lower limb: Secondary | ICD-10-CM

## 2017-10-27 DIAGNOSIS — R0602 Shortness of breath: Secondary | ICD-10-CM | POA: Diagnosis not present

## 2017-10-27 DIAGNOSIS — I509 Heart failure, unspecified: Secondary | ICD-10-CM | POA: Diagnosis not present

## 2017-10-27 DIAGNOSIS — L989 Disorder of the skin and subcutaneous tissue, unspecified: Secondary | ICD-10-CM

## 2017-10-27 DIAGNOSIS — R059 Cough, unspecified: Secondary | ICD-10-CM

## 2017-10-27 DIAGNOSIS — I700 Atherosclerosis of aorta: Secondary | ICD-10-CM | POA: Diagnosis not present

## 2017-10-27 DIAGNOSIS — R300 Dysuria: Secondary | ICD-10-CM

## 2017-10-27 LAB — COMPLETE METABOLIC PANEL WITH GFR
AG Ratio: 1.3 (calc) (ref 1.0–2.5)
ALT: 12 U/L (ref 6–29)
AST: 11 U/L (ref 10–35)
Albumin: 3.8 g/dL (ref 3.6–5.1)
Alkaline phosphatase (APISO): 47 U/L (ref 33–130)
BUN/Creatinine Ratio: 21 (calc) (ref 6–22)
BUN: 19 mg/dL (ref 7–25)
CO2: 29 mmol/L (ref 20–32)
Calcium: 8.8 mg/dL (ref 8.6–10.4)
Chloride: 103 mmol/L (ref 98–110)
Creat: 0.89 mg/dL — ABNORMAL HIGH (ref 0.60–0.88)
GFR, Est African American: 68 mL/min/{1.73_m2} (ref >=60)
GFR, Est Non African American: 59 mL/min/{1.73_m2} — ABNORMAL LOW (ref >=60)
Globulin: 2.9 g/dL (calc) (ref 1.9–3.7)
Glucose, Bld: 134 mg/dL — ABNORMAL HIGH (ref 65–99)
Potassium: 3.3 mmol/L — ABNORMAL LOW (ref 3.5–5.3)
Sodium: 140 mmol/L (ref 135–146)
Total Bilirubin: 0.8 mg/dL (ref 0.2–1.2)
Total Protein: 6.7 g/dL (ref 6.1–8.1)

## 2017-10-27 LAB — CBC
HCT: 38 % (ref 35.0–45.0)
Hemoglobin: 12.7 g/dL (ref 11.7–15.5)
MCH: 30.7 pg (ref 27.0–33.0)
MCHC: 33.4 g/dL (ref 32.0–36.0)
MCV: 91.8 fL (ref 80.0–100.0)
MPV: 12 fL (ref 7.5–12.5)
Platelets: 192 10*3/uL (ref 140–400)
RBC: 4.14 10*6/uL (ref 3.80–5.10)
RDW: 12.6 % (ref 11.0–15.0)
WBC: 6.7 10*3/uL (ref 3.8–10.8)

## 2017-10-27 MED ORDER — MUPIROCIN 2 % EX OINT: TOPICAL_OINTMENT | CUTANEOUS | 0 refills | Status: DC

## 2017-10-27 MED ORDER — DOXYCYCLINE HYCLATE 100 MG PO CAPS: 100.0000 mg | ORAL_CAPSULE | Freq: Two times a day (BID) | ORAL | 0 refills | Status: DC

## 2017-10-27 NOTE — Discharge Instructions (Signed)
°  Please bring back a sample of urine before 8PM tonight, preferably  before 7PM so we can run the urine and send any additional medication if needed.  Please do not take your antibiotic, doxycycline, until you have provided a urine sample so it does not alter the test.   Please take ALL of your medications as prescribed to help with leg swelling and shortness of breath.  Please call Dr. Mardelle Matte office to schedule a follow up appointment this week if possible.  Please go to the hospital if symptoms worsen.

## 2017-10-27 NOTE — ED Notes (Signed)
Attempted to draw blood from [facility], unsuccessful.  Sent to lab for draw

## 2017-10-27 NOTE — ED Provider Notes (Signed)
[facility]    CSN: [id number] Arrival date & time: 10/27/17  0855     History   Chief Complaint Chief Complaint  Patient presents with  . Cough    HPI Lawanda Holzheimer is a 84 y.o. female.   HPI  Jakita Dutkiewicz is a 84 y.o. female presenting to UC with c/o bilateral leg swelling for over 1 month, congestion with cough for about 3 weeks and urinary leakage for about 2 weeks.  She has not taken her Lasix consistently for over 1 year due to not wanting to wear depends due to urinary frequency with the medication.  She has not f/u with her PCP in over 1 year.  She last saw her cardiologist in February of this year.  Denies fever, chills, n/v/d. She does have an inhaler for asthma but has not needed to use it recently. She does have known CHF.  She also reports hx of cellulitis in her legs previously. Her daughter convinced her to come be evaluated for the legs swelling and weeping.    Past Medical History:  Diagnosis Date  . Asthma   . Atrial fibrillation (HCC) 09/27/2011  . CAD (coronary artery disease) 09/27/2011  . Diabetes mellitus without complication (HCC)   . Dyslipidemia 09/27/2011  . History of vasculitis 09/27/2011  . Hypertension 09/27/2011  . Osteoporosis 09/27/2011  . Pacemaker   . PUD (peptic ulcer disease)   . Rheumatoid arthritis(714.0) 09/27/2011    Patient Active Problem List   Diagnosis Date Noted  . Acute on chronic diastolic heart failure (HCC) 10/03/2016  . Atrial fibrillation, chronic (HCC) 10/03/2016  . Chest tightness 10/03/2016  . HLD (hyperlipidemia) 10/03/2016  . History of cardiac pacemaker 2/2 CHB 10/03/2016  . Morbid obesity with BMI of 40.0-44.9, adult (HCC) 10/03/2016  . Short-term memory loss 10/03/2016  . Pressure injury of skin 10/03/2016  . Blood in stool 10/15/2015  . Encopresis 10/15/2015  . Incontinence 10/15/2015  . Cerebrovascular disease 08/24/2014  . Vasculitis (HCC) 04/12/2014  . Neck mass 02/08/2013  .  Hyperlipidemia 01/13/2013  . Type 2 diabetes mellitus (HCC) 11/30/2012  . Diverticulosis 11/04/2011  . History of (heterozygous) Hemochromatosis 10/10/2011  . Nephrolithiasis - 0.3cm Right Ureter August 2013 10/07/2011  . Shingles outbreak 10/01/2011  . Dyslipidemia 09/27/2011  . Hypertension 09/27/2011  . CAD (coronary artery disease) 09/27/2011  . Atrial fibrillation (HCC) 09/27/2011  . Sleep apnea 09/27/2011  . History of gout 09/27/2011  . Pacemaker 09/27/2011  . Rheumatoid arthritis(714.0) 09/27/2011  . Osteoporosis 09/27/2011    Past Surgical History:  Procedure Laterality Date  . ABDOMINAL HYSTERECTOMY    . ANKLE SURGERY    . APPENDECTOMY    . Carpal tunnel    . CHOLECYSTECTOMY    . PARTIAL GASTRECTOMY      OB History   None      Home Medications    Prior to Admission medications   Medication Sig Start Date End Date Taking? Authorizing Provider  apixaban (ELIQUIS) 5 MG TABS tablet Take 1 tablet (5 mg total) by mouth 2 (two) times daily. 03/18/17   Lewayne Bunting, MD  doxycycline (VIBRAMYCIN) 100 MG capsule Take 1 capsule (100 mg total) by mouth 2 (two) times daily. One po bid x 7 days 10/27/17   Lurene Shadow, PA-C  furosemide (LASIX) 40 MG tablet TAKE 1-1&1/2 TABLETS BY MOUTH 2 TIMES A DAY TO HELP WITH SWELLING 04/16/16   Breeback, Jade L, PA-C  lisinopril (PRINIVIL,ZESTRIL) 10 MG  tablet Take 1 tablet (10 mg total) by mouth daily. 03/18/17 06/16/17  Lewayne Bunting, MD  metoprolol succinate (TOPROL-XL) 100 MG 24 hr tablet Take 1 tablet (100 mg total) daily by mouth. MUST make follow up visit for future refills. 01/23/17   Jomarie Longs, PA-C  mupirocin ointment (BACTROBAN) 2 % Apply to wound 3 times daily for 5 days 10/27/17   Lurene Shadow, PA-C  nitroGLYCERIN (NITROSTAT) 0.4 MG SL tablet Place 1 tablet (0.4 mg total) under the tongue every 5 (five) minutes as needed for chest pain (MAX 3 TABLETS). 06/13/16   Marinus Maw, MD  simvastatin (ZOCOR) 20 MG tablet  Take 1 tablet (20 mg total) at bedtime by mouth. MUST make appointment for future refills. 01/23/17   Breeback, Lonna Cobb, PA-C  traMADol-acetaminophen (ULTRACET) 37.5-325 MG tablet TAKE ONE TABLET BY MOUTH EVERY 8 HOURS AS NEEDED FOR ARTHRITIS PAIN 11/30/15   Monica Becton, MD    Family History Family History  Problem Relation Age of Onset  . Hypertension Mother   . Cancer Brother   . Cancer Brother     Social History Social History   Tobacco Use  . Smoking status: Never Smoker  . Smokeless tobacco: Never Used  Substance Use Topics  . Alcohol use: Yes    Alcohol/week: 0.0 standard drinks    Comment: Occasional  . Drug use: No     Allergies   Aspirin; Other; Penicillins; Codeine; Morphine and related; Sulfa antibiotics; Ether; Levaquin [levofloxacin in d5w]; Metoprolol; Prednisone; and Procaine   Review of Systems Review of Systems  Constitutional: Negative for chills and fever.  HENT: Positive for congestion. Negative for ear pain and sore throat.   Respiratory: Positive for cough. Negative for chest tightness and shortness of breath.   Cardiovascular: Positive for leg swelling. Negative for chest pain and palpitations.  Gastrointestinal: Negative for abdominal pain, diarrhea, nausea and vomiting.  Genitourinary: Positive for frequency and urgency.     Physical Exam Triage Vital Signs ED Triage Vitals  Enc Vitals Group     BP 10/27/17 0930 (!) 149/78     Pulse Rate 10/27/17 0930 60     Resp --      Temp 10/27/17 0930 97.6 F (36.4 C)     Temp Source 10/27/17 0930 Oral     SpO2 10/27/17 0930 93 %     Weight 10/27/17 0931 212 lb (96.2 kg)     Height --      Head Circumference --      Peak Flow --      Pain Score --      Pain Loc --      Pain Edu? --      Excl. in GC? --    No data found.  Updated Vital Signs BP (!) 149/78 (BP Location: Right Arm)   Pulse 60   Temp 97.6 F (36.4 C) (Oral)   Wt 212 lb (96.2 kg)   SpO2 93%   BMI 41.40 kg/m    Visual Acuity Right Eye Distance:   Left Eye Distance:   Bilateral Distance:    Right Eye Near:   Left Eye Near:    Bilateral Near:     Physical Exam  Constitutional: She is oriented to person, place, and time. She appears well-developed and well-nourished. No distress.  HENT:  Head: Normocephalic and atraumatic.  Mouth/Throat: Oropharynx is clear and moist.  Eyes: EOM are normal.  Neck: Normal range of motion. Neck supple.  Cardiovascular: Normal rate and regular rhythm.  Pulmonary/Chest: Effort normal. No stridor. No respiratory distress. She has decreased breath sounds in the right middle field, the right lower field, the left middle field and the left lower field. She has no wheezes. She has no rhonchi.  Musculoskeletal: Normal range of motion. She exhibits edema and tenderness.  Bilateral lower leg edema 2-3+ pitting edema. Mild diffuse tenderness in lower legs.   Neurological: She is alert and oriented to person, place, and time.  Skin: Skin is warm and dry. She is not diaphoretic.  Bilateral lower legs: hyperpigmented skin, faint erythema of anterior Right lower leg. Superficial skin wound, weeping clear-yellow discharge on Right leg.  Left leg: 0.5cm area of peeling skin draining scant clear fluid.   Psychiatric: She has a normal mood and affect. Her behavior is normal.  Nursing note and vitals reviewed.    UC Treatments / Results  Labs (all labs ordered are listed, but only abnormal results are displayed) Labs Reviewed  COMPLETE METABOLIC PANEL WITH GFR - Abnormal; Notable for the following components:      Result Value   Glucose, Bld 134 (*)    Creat 0.89 (*)    GFR, Est Non African American 59 (*)    Potassium 3.3 (*)    All other components within normal limits  URINE CULTURE  CBC  BRAIN NATRIURETIC PEPTIDE    EKG None  Radiology Dg Chest 2 View  Result Date: 10/27/2017 CLINICAL DATA:  Cough, chest congestion, and shortness of breath for the past 3-4  months. 3-4 weeks of bilateral leg edema. EXAM: CHEST - 2 VIEW COMPARISON:  PA and lateral chest x-ray of October 03, 2016 FINDINGS: The lungs are well-expanded. The cardiac silhouette is enlarged. The pulmonary vascularity is engorged. The interstitial markings are increased. The ICD is in stable position. There is calcification in the wall of the aortic arch. There is multilevel degenerative disc disease of the thoracic spine. IMPRESSION: CHF with mild interstitial edema.  No alveolar pneumonia. Thoracic aortic atherosclerosis. Electronically Signed   By: David  [place] M.D.   On: 10/27/2017 11:23    Procedures Procedures (including critical care time)  Medications Ordered in UC Medications - No data to display  Initial Impression / Assessment and Plan / UC Course  I have reviewed the triage vital signs and the nursing notes.  Pertinent labs & imaging results that were available during my care of the patient were reviewed by me and considered in my medical decision making (see chart for details).     Discussed imaging with pt Pt has known CHF. CBC and CMP were ordered prior to CXR BNP ordered after CXR results, however, lab was not able to obtain any additional blood today.   Pt is in good condition at this time to be discharged home. Will start on antibiotics for lower leg edema and wounds.  Wounds were bandaged today and legs wrapped with assending Ace wrap for edema.  Strongly advised f/u with PCP and cardiology this week for further evaluation and treatment. Advised pt she may return tomorrow for another try for the BNP lab. Strongly encouraged to take her medications, including furosemide as prescribed. Discussed symptoms that warrant emergent care in the ED. Pt and daughter verbalized understanding and agreement with tx plan.   Final Clinical Impressions(s) / UC Diagnoses   Final diagnoses:  Bilateral leg edema  Cough  Dysuria  Sore on leg  Cellulitis of right lower leg  Discharge Instructions      Please bring back a sample of urine before 8PM tonight, preferably  before 7PM so we can run the urine and send any additional medication if needed.  Please do not take your antibiotic, doxycycline, until you have provided a urine sample so it does not alter the test.   Please take ALL of your medications as prescribed to help with leg swelling and shortness of breath.  Please call Dr. Mardelle Matte office to schedule a follow up appointment this week if possible.  Please go to the hospital if symptoms worsen.     ED Prescriptions    Medication Sig Dispense Auth. Provider   doxycycline (VIBRAMYCIN) 100 MG capsule Take 1 capsule (100 mg total) by mouth 2 (two) times daily. One po bid x 7 days 14 capsule Doroteo Glassman, Caedyn Tassinari O, PA-C   mupirocin ointment (BACTROBAN) 2 % Apply to wound 3 times daily for 5 days 22 g Lurene Shadow, [place]     Controlled Substance Prescriptions [place] Controlled Substance Registry consulted? Not Applicable   Rolla Plate 10/27/17 1713

## 2017-10-27 NOTE — ED Triage Notes (Signed)
Pt c/o bilateral leg swelling for 1 month, cough and congestion x3 weeks and urinary leakage x2 weeks. She has not scheduled and appt with her pcp/

## 2017-10-28 ENCOUNTER — Telehealth: Payer: Self-pay

## 2017-10-28 ENCOUNTER — Telehealth: Payer: Self-pay | Admitting: Cardiology

## 2017-10-28 LAB — POCT URINALYSIS DIP (MANUAL ENTRY)
Bilirubin, UA: NEGATIVE
Glucose, UA: NEGATIVE mg/dL
Ketones, POC UA: NEGATIVE mg/dL
Leukocytes, UA: NEGATIVE
Nitrite, UA: NEGATIVE
Protein Ur, POC: 30 mg/dL — AB
Spec Grav, UA: 1.015 (ref 1.010–1.025)
Urobilinogen, UA: 1 E.U./dL
pH, UA: 6.5 (ref 5.0–8.0)

## 2017-10-28 NOTE — Telephone Encounter (Signed)
Did not call. Noticed UCX was pending.

## 2017-10-28 NOTE — Telephone Encounter (Signed)
Called pt back she states that she went to [facility] ER yesterday. She has stopped ALL of her medications(for about 1 year), she states that she had "given up but did not realize it until her daughter came yesterday" and told her to go to the ER, but she promises to re-start her medications tomorrow and she will take her BP once daily 1 hour after taking her medications. Made appt with [facility] 9-23 and pt will take her medications until then and bring them and log with her to appt for [facility] review.

## 2017-10-28 NOTE — Telephone Encounter (Signed)
Spoke with patient, she is feeling better, is calling cardiologist today for follow up.

## 2017-10-28 NOTE — Telephone Encounter (Signed)
New message:     Pt's daughter is calling and states the pt went to the med center in [place] on yesterday with leg edema and sob. She states they told the pt to f/u with [facility] as soon as possible.

## 2017-10-30 ENCOUNTER — Ambulatory Visit (INDEPENDENT_AMBULATORY_CARE_PROVIDER_SITE_OTHER): Payer: Medicare Other | Admitting: Osteopathic Medicine

## 2017-10-30 ENCOUNTER — Encounter: Payer: Self-pay | Admitting: Osteopathic Medicine

## 2017-10-30 ENCOUNTER — Telehealth: Payer: Self-pay | Admitting: *Deleted

## 2017-10-30 VITALS — BP 161/60 | HR 50 | Temp 98.1°F

## 2017-10-30 DIAGNOSIS — Z7189 Other specified counseling: Secondary | ICD-10-CM

## 2017-10-30 DIAGNOSIS — I482 Chronic atrial fibrillation, unspecified: Secondary | ICD-10-CM

## 2017-10-30 DIAGNOSIS — I2510 Atherosclerotic heart disease of native coronary artery without angina pectoris: Secondary | ICD-10-CM | POA: Diagnosis not present

## 2017-10-30 DIAGNOSIS — I10 Essential (primary) hypertension: Secondary | ICD-10-CM | POA: Diagnosis not present

## 2017-10-30 DIAGNOSIS — Z950 Presence of cardiac pacemaker: Secondary | ICD-10-CM | POA: Diagnosis not present

## 2017-10-30 DIAGNOSIS — R600 Localized edema: Secondary | ICD-10-CM

## 2017-10-30 DIAGNOSIS — I2583 Coronary atherosclerosis due to lipid rich plaque: Secondary | ICD-10-CM

## 2017-10-30 NOTE — Telephone Encounter (Signed)
Called [facility] regarding urine culture. Lab did not receive a urine culture for this patient on this date of service.

## 2017-10-30 NOTE — Progress Notes (Signed)
HPI: Katherine Cervantes is a 84 y.o. female who  has a past medical history of Asthma, Atrial fibrillation (HCC) (09/27/2011), CAD (coronary artery disease) (09/27/2011), Diabetes mellitus without complication (HCC), Dyslipidemia (09/27/2011), History of vasculitis (09/27/2011), Hypertension (09/27/2011), Osteoporosis (09/27/2011), Pacemaker, PUD (peptic ulcer disease), and Rheumatoid arthritis(714.0) (09/27/2011).  she presents to [facility] today, 10/30/17,  for chief complaint of: Follow-up from urgent care, see specific headings below  On interview with the patient today:  She is here today with her son.  We had a long discussion about medications, status of her current medical problems particularly the lower extremity edema and atrial fib.  We had a long discussion about medication management of these issues.  We had a long discussion about advanced directives and we reviewed the advanced directives that she has on file with [place] currently.  See below for assessment/plan.    Pertinent records reviewed prior to visit:  I have not seen this patient since 06/2016. She was seen 3 days ago in urgent care with complaints of bilateral leg swelling for 1 month, cough/congestion for 3 weeks, urinary incontinence for about 2 weeks.  Had not been taking her Lasix consistently over the past year.  Legs have been swelling to the point of skin weeping and her daughter convinced her to go to urgent care.  On physical exam, decreased breath sounds in right and left middle and lower lung fields, bilateral lower leg edema 2-3+ with evidence of peripheral vascular disease.  Chest x-ray demonstrated CHF with mild interstitial edema. labs demonstrated proteinuria, Kos of 134, renal function was okay, potassium was decreased to 3.3, BNP is pending as is urine culture.  Patient was discharged on antibiotics for skin infection concern.   Last echocardiogram on file 10/04/2016, EF 50 to 55%,  decreased left ventricular diastolic compliance, calcified mitral valve, recommended additional imaging with echo contrast.  Last cardiology visit with Dr. [name] 03/18/2017 for follow-up atrial fibrillation/flutter.  At that time, had self discontinued Coumadin and was started on apixaban 5 mg twice daily, resume lisinopril 10 mg, coronary artery disease history of unclear no ischemia evaluation was felt to be necessary at that time.  Lower extremity edema plan was to continue present dose of diuretic which was furosemide 40 mg 1 to 1.5 tablets twice daily  Wt Readings from Last 3 Encounters:  10/27/17 212 lb (96.2 kg)  03/18/17 243 lb 9.6 oz (110.5 kg)  11/05/16 243 lb 6.4 oz (110.4 kg)          Past medical, surgical, social and family history reviewed:  Patient Active Problem List   Diagnosis Date Noted  . Acute on chronic diastolic heart failure (HCC) 10/03/2016  . Atrial fibrillation, chronic (HCC) 10/03/2016  . Chest tightness 10/03/2016  . HLD (hyperlipidemia) 10/03/2016  . History of cardiac pacemaker 2/2 CHB 10/03/2016  . Morbid obesity with BMI of 40.0-44.9, adult (HCC) 10/03/2016  . Short-term memory loss 10/03/2016  . Pressure injury of skin 10/03/2016  . Blood in stool 10/15/2015  . Encopresis 10/15/2015  . Incontinence 10/15/2015  . Cerebrovascular disease 08/24/2014  . Vasculitis (HCC) 04/12/2014  . Neck mass 02/08/2013  . Hyperlipidemia 01/13/2013  . Type 2 diabetes mellitus (HCC) 11/30/2012  . Diverticulosis 11/04/2011  . History of (heterozygous) Hemochromatosis 10/10/2011  . Nephrolithiasis - 0.3cm Right Ureter August 2013 10/07/2011  . Shingles outbreak 10/01/2011  . Dyslipidemia 09/27/2011  . Hypertension 09/27/2011  . CAD (coronary artery disease) 09/27/2011  . Atrial  fibrillation (HCC) 09/27/2011  . Sleep apnea 09/27/2011  . History of gout 09/27/2011  . Pacemaker 09/27/2011  . Rheumatoid arthritis(714.0) 09/27/2011  . Osteoporosis 09/27/2011     Past Surgical History:  Procedure Laterality Date  . ABDOMINAL HYSTERECTOMY    . ANKLE SURGERY    . APPENDECTOMY    . Carpal tunnel    . CHOLECYSTECTOMY    . PARTIAL GASTRECTOMY      Social History   Tobacco Use  . Smoking status: Never Smoker  . Smokeless tobacco: Never Used  Substance Use Topics  . Alcohol use: Yes    Alcohol/week: 0.0 standard drinks    Comment: Occasional    Family History  Problem Relation Age of Onset  . Hypertension Mother   . Cancer Brother   . Cancer Brother      Current medication list and allergy/intolerance information reviewed:    Current Outpatient Medications  Medication Sig Dispense Refill  . apixaban (ELIQUIS) 5 MG TABS tablet Take 1 tablet (5 mg total) by mouth 2 (two) times daily. 60 tablet 6  . doxycycline (VIBRAMYCIN) 100 MG capsule Take 1 capsule (100 mg total) by mouth 2 (two) times daily. One po bid x 7 days 14 capsule 0  . furosemide (LASIX) 40 MG tablet TAKE 1-1&1/2 TABLETS BY MOUTH 2 TIMES A DAY TO HELP WITH SWELLING 180 tablet 2  . lisinopril (PRINIVIL,ZESTRIL) 10 MG tablet Take 1 tablet (10 mg total) by mouth daily. 90 tablet 3  . metoprolol succinate (TOPROL-XL) 100 MG 24 hr tablet Take 1 tablet (100 mg total) daily by mouth. MUST make follow up visit for future refills. 15 tablet 0  . mupirocin ointment (BACTROBAN) 2 % Apply to wound 3 times daily for 5 days 22 g 0  . nitroGLYCERIN (NITROSTAT) 0.4 MG SL tablet Place 1 tablet (0.4 mg total) under the tongue every 5 (five) minutes as needed for chest pain (MAX 3 TABLETS). 25 tablet 3  . simvastatin (ZOCOR) 20 MG tablet Take 1 tablet (20 mg total) at bedtime by mouth. MUST make appointment for future refills. 15 tablet 0  . traMADol-acetaminophen (ULTRACET) 37.5-325 MG tablet TAKE ONE TABLET BY MOUTH EVERY 8 HOURS AS NEEDED FOR ARTHRITIS PAIN 45 tablet 0   No current facility-administered medications for this visit.     Allergies  Allergen Reactions  . Aspirin  Anaphylaxis  . Other Anaphylaxis  . Penicillins Anaphylaxis    Anaphylaxis  . Codeine Other (See Comments)    SOB Asthmatic issues  . Morphine And Related Other (See Comments)    Anaphylaxis  . Sulfa Antibiotics Rash  . Ether   . Levaquin [Levofloxacin In D5w] Other (See Comments)    Disoriented, Hallucinations  . Metoprolol     Possible  . Prednisone Other (See Comments)    Lethargic, rash  . Procaine       Review of Systems:  Constitutional:  No  fever, no chills, +recent illness, +significant fatigue.   HEENT: No  headache, no vision change  Cardiac: No  chest pain, No  pressure, +palpitations, +Orthopnea  Respiratory:  No  shortness of breath. No  Cough  Gastrointestinal: No  abdominal pain, No  nausea  Skin: +Rash  Genitourinary: +incontinence  Neurologic: +genrealized but no focal weakness, No  dizziness, No  slurred speech/focal weakness/facial droop  Psychiatric: +occasional concerns with depression, No  concerns with anxiety, No sleep problems, No mood problems  Exam:  BP (!) 161/60 (BP Location: Left Arm, Patient Position:  Sitting, Cuff Size: Large)   Pulse (!) 50   Temp 98.1 F (36.7 C) (Oral)   SpO2 95%   Constitutional: VS see above. General Appearance: alert, well-developed, well-nourished, NAD  Eyes: Normal lids and conjunctive, non-icteric sclera  Ears, Nose, Mouth, Throat: MMM  Neck: No masses, trachea midline. No thyroid enlargement  Respiratory: Normal respiratory effort. no wheeze, no rhonchi, no rales  Cardiovascular: S1/S2 normal, no murmur, no rub/gallop auscultated.  Gastrointestinal: Nontender, no masses.   Neurological: Normal balance/coordination. No tremor.   Skin: warm. Hyperpigmented and superficial ulceration bilateral lower extremities no cellulitis apparent   Psychiatric: Normal judgment/insight. Normal mood and affect. Oriented x3.    Results for orders placed or performed in visit on 10/28/17 (from the past 72  hour(s))  POCT urinalysis dipstick     Status: Abnormal   Collection Time: 10/28/17  9:39 AM  Result Value Ref Range   Color, UA yellow yellow   Clarity, UA clear clear   Glucose, UA negative negative mg/dL   Bilirubin, UA negative negative   Ketones, POC UA negative negative mg/dL   Spec Grav, UA 7.673 4.193 - 1.025   Blood, UA trace-lysed (A) negative   pH, UA 6.5 5.0 - 8.0   Protein Ur, POC =30 (A) negative mg/dL   Urobilinogen, UA 1.0 0.2 or 1.0 E.U./dL   Nitrite, UA Negative Negative   Leukocytes, UA Negative Negative    Dg Chest 2 View  Result Date: 10/27/2017 CLINICAL DATA:  Cough, chest congestion, and shortness of breath for the past 3-4 months. 3-4 weeks of bilateral leg edema. EXAM: CHEST - 2 VIEW COMPARISON:  PA and lateral chest x-ray of October 03, 2016 FINDINGS: The lungs are well-expanded. The cardiac silhouette is enlarged. The pulmonary vascularity is engorged. The interstitial markings are increased. The ICD is in stable position. There is calcification in the wall of the aortic arch. There is multilevel degenerative disc disease of the thoracic spine. IMPRESSION: CHF with mild interstitial edema.  No alveolar pneumonia. Thoracic aortic atherosclerosis. Electronically Signed   By: David  [place] M.D.   On: 10/27/2017 11:23                   ASSESSMENT/PLAN: The primary encounter diagnosis was Advance directive discussed with patient. Diagnoses of Essential hypertension, Coronary artery disease due to lipid rich plaque, Chronic atrial fibrillation (HCC), Pacemaker, and Bilateral lower extremity edema were also pertinent to this visit.   I had a nice long discussion with the patient and her son.  I voiced my concerns about her not taking her medications.  She states that she is 84 years old, she has lived a good life, she wants to just let nature take its course.  I advised her that it was of course my job to make sure that she was fully informed of the  potential consequences of her decisions.  We talked about how, ideally, she would have an event in her sleep and passed peacefully but this is not always the reality and she certainly has the potential to have a nonfatal stroke or heart attack or other complication that leaves her debilitated and unable to care for herself.  She states that she is fine with having CPR or intubation if needed for illness such as pneumonia, but if it is looking like she would not be able to successfully come off of life support she would want her kids to "pull the plug".    Reviewed the advanced directives  that she completed in January of last year and she is satisfied with the state of the document.  I let her know that if she ever changes her mind and absolutely does not want CPR, intubation, she should let me know so that we can arrange a DNR order and MOST form  Patient will think about resuming certain medications for palliative purposes, particularly the Lasix, metoprolol to avoid palpitations, Eliquis to avoid stroke     Visit summary with medication list and pertinent instructions was printed for patient to review. All questions at time of visit were answered - patient instructed to contact office with any additional concerns. ER/RTC precautions were reviewed with the patient.   Follow-up plan: Return in about 1 week (around 11/06/2017) for recheck leg wounds and revisit medications vs no .  Note: Total time spent 40 minutes, greater than 50% of the visit was spent face-to-face counseling and coordinating care for the following: The primary encounter diagnosis was Advance directive discussed with patient. Diagnoses of Essential hypertension, Coronary artery disease due to lipid rich plaque, Chronic atrial fibrillation (HCC), Pacemaker, and Bilateral lower extremity edema were also pertinent to this visit.Marland Kitchen  Please note: voice recognition software was used to produce this document, and typos may escape review.  Please contact Dr. Lyn Hollingshead for any needed clarifications.

## 2017-11-03 ENCOUNTER — Ambulatory Visit: Payer: Medicare Other | Admitting: Cardiology

## 2017-11-03 NOTE — Progress Notes (Deleted)
HPI: FU atrial fibrillation/flutter. Patient has had previous care in [place]. Previous cath but no records available. Previous atrial flutter ablation and pacemaker. Nuclear study September 2011 showed ejection fraction 76% and no ischemia or infarction. Carotid dopplers 3/17 showed 1-39 bilateral stenosis.Echo 8/18 showed EF 50-55, mild LAE.   Patient seen with CHF September 2019.  Since last seen,   Current Outpatient Medications  Medication Sig Dispense Refill  . apixaban (ELIQUIS) 5 MG TABS tablet Take 1 tablet (5 mg total) by mouth 2 (two) times daily. 60 tablet 6  . doxycycline (VIBRAMYCIN) 100 MG capsule Take 1 capsule (100 mg total) by mouth 2 (two) times daily. One po bid x 7 days 14 capsule 0  . furosemide (LASIX) 40 MG tablet TAKE 1-1&1/2 TABLETS BY MOUTH 2 TIMES A DAY TO HELP WITH SWELLING 180 tablet 2  . lisinopril (PRINIVIL,ZESTRIL) 10 MG tablet Take 1 tablet (10 mg total) by mouth daily. 90 tablet 3  . metoprolol succinate (TOPROL-XL) 100 MG 24 hr tablet Take 1 tablet (100 mg total) daily by mouth. MUST make follow up visit for future refills. 15 tablet 0  . mupirocin ointment (BACTROBAN) 2 % Apply to wound 3 times daily for 5 days 22 g 0  . nitroGLYCERIN (NITROSTAT) 0.4 MG SL tablet Place 1 tablet (0.4 mg total) under the tongue every 5 (five) minutes as needed for chest pain (MAX 3 TABLETS). 25 tablet 3  . simvastatin (ZOCOR) 20 MG tablet Take 1 tablet (20 mg total) at bedtime by mouth. MUST make appointment for future refills. 15 tablet 0  . traMADol-acetaminophen (ULTRACET) 37.5-325 MG tablet TAKE ONE TABLET BY MOUTH EVERY 8 HOURS AS NEEDED FOR ARTHRITIS PAIN 45 tablet 0   No current facility-administered medications for this visit.      Past Medical History:  Diagnosis Date  . Asthma   . Atrial fibrillation (HCC) 09/27/2011  . CAD (coronary artery disease) 09/27/2011  . Diabetes mellitus without complication (HCC)   . Dyslipidemia 09/27/2011  . History of  vasculitis 09/27/2011  . Hypertension 09/27/2011  . Osteoporosis 09/27/2011  . Pacemaker   . PUD (peptic ulcer disease)   . Rheumatoid arthritis(714.0) 09/27/2011    Past Surgical History:  Procedure Laterality Date  . ABDOMINAL HYSTERECTOMY    . ANKLE SURGERY    . APPENDECTOMY    . Carpal tunnel    . CHOLECYSTECTOMY    . PARTIAL GASTRECTOMY      Social History   Socioeconomic History  . Marital status: Widowed    Spouse name: Not on file  . Number of children: 7  . Years of education: Not on file  . Highest education level: Not on file  Occupational History  . Not on file  Social Needs  . Financial resource strain: Not on file  . Food insecurity:    Worry: Not on file    Inability: Not on file  . Transportation needs:    Medical: Not on file    Non-medical: Not on file  Tobacco Use  . Smoking status: Never Smoker  . Smokeless tobacco: Never Used  Substance and Sexual Activity  . Alcohol use: Yes    Alcohol/week: 0.0 standard drinks    Comment: Occasional  . Drug use: No  . Sexual activity: Not on file  Lifestyle  . Physical activity:    Days per week: Not on file    Minutes per session: Not on file  . Stress: Not on file  Relationships  . Social connections:    Talks on phone: Not on file    Gets together: Not on file    Attends religious service: Not on file    Active member of club or organization: Not on file    Attends meetings of clubs or organizations: Not on file    Relationship status: Not on file  . Intimate partner violence:    Fear of current or ex partner: Not on file    Emotionally abused: Not on file    Physically abused: Not on file    Forced sexual activity: Not on file  Other Topics Concern  . Not on file  Social History Narrative  . Not on file    Family History  Problem Relation Age of Onset  . Hypertension Mother   . Cancer Brother   . Cancer Brother     ROS: no fevers or chills, productive cough, hemoptysis, dysphasia,  odynophagia, melena, hematochezia, dysuria, hematuria, rash, seizure activity, orthopnea, PND, pedal edema, claudication. Remaining systems are negative.  Physical Exam: Well-developed well-nourished in no acute distress.  Skin is warm and dry.  HEENT is normal.  Neck is supple.  Chest is clear to auscultation with normal expansion.  Cardiovascular exam is regular rate and rhythm.  Abdominal exam nontender or distended. No masses palpated. Extremities show no edema. neuro grossly intact  ECG- personally reviewed  A/P  1  Katherine Millers, MD

## 2017-11-04 ENCOUNTER — Ambulatory Visit: Payer: Medicare Other | Admitting: Physician Assistant

## 2017-11-06 ENCOUNTER — Ambulatory Visit: Payer: Medicare Other | Admitting: Osteopathic Medicine

## 2017-11-10 ENCOUNTER — Telehealth: Payer: Self-pay | Admitting: Family Medicine

## 2017-11-10 ENCOUNTER — Encounter: Payer: Self-pay | Admitting: Family Medicine

## 2017-11-19 NOTE — Progress Notes (Signed)
HPI: FU atrial fibrillation/flutter. Patient has had previous care in [place]. Previous cath but no records available. Previous atrial flutter ablation and pacemaker. Nuclear study September 2011 showed ejection fraction 76% and no ischemia or infarction. Carotid dopplers 3/17 showed 1-39 bilateral stenosis.Echo 8/18 showed EF 50-55, mild LAE. Since last seen, patient discontinued all of her medications on her own.  She has some dyspnea on exertion but actually states it is improving with activities.  No orthopnea or PND.  She has not taken her Lasix in 3 months and she has had worsening pedal edema.  She occasionally has chest pain which is chronic and unchanged.  No syncope.  Current Outpatient Medications  Medication Sig Dispense Refill  . furosemide (LASIX) 40 MG tablet TAKE 1-1&1/2 TABLETS BY MOUTH 2 TIMES A DAY TO HELP WITH SWELLING (Patient taking differently: Take 1.5 tablets (60 mg total) by mouth every morning and take 1 tablet (40 mg total) by mouth every afternoon) 180 tablet 2  . furosemide (LASIX) 40 MG tablet Take 1.5 tablets (60 mg total) by mouth every morning and take 1 tablet (40 mg total) by mouth every afternoon    . apixaban (ELIQUIS) 5 MG TABS tablet Take 1 tablet (5 mg total) by mouth 2 (two) times daily. (Patient not taking: Reported on 12/02/2017) 60 tablet 6  . doxycycline (VIBRAMYCIN) 100 MG capsule Take 1 capsule (100 mg total) by mouth 2 (two) times daily. One po bid x 7 days (Patient not taking: Reported on 12/02/2017) 14 capsule 0  . lisinopril (PRINIVIL,ZESTRIL) 10 MG tablet Take 1 tablet (10 mg total) by mouth daily. (Patient not taking: Reported on 12/02/2017) 90 tablet 3  . metoprolol succinate (TOPROL-XL) 100 MG 24 hr tablet Take 1 tablet (100 mg total) daily by mouth. MUST make follow up visit for future refills. (Patient not taking: Reported on 12/02/2017) 15 tablet 0  . mupirocin ointment (BACTROBAN) 2 % Apply to wound 3 times daily for 5 days (Patient  not taking: Reported on 12/02/2017) 22 g 0  . nitroGLYCERIN (NITROSTAT) 0.4 MG SL tablet Place 1 tablet (0.4 mg total) under the tongue every 5 (five) minutes as needed for chest pain (MAX 3 TABLETS). (Patient not taking: Reported on 12/02/2017) 25 tablet 3  . simvastatin (ZOCOR) 20 MG tablet Take 1 tablet (20 mg total) at bedtime by mouth. MUST make appointment for future refills. (Patient not taking: Reported on 12/02/2017) 15 tablet 0  . traMADol-acetaminophen (ULTRACET) 37.5-325 MG tablet TAKE ONE TABLET BY MOUTH EVERY 8 HOURS AS NEEDED FOR ARTHRITIS PAIN (Patient not taking: Reported on 12/02/2017) 45 tablet 0   No current facility-administered medications for this visit.      Past Medical History:  Diagnosis Date  . Asthma   . Atrial fibrillation (HCC) 09/27/2011  . CAD (coronary artery disease) 09/27/2011  . Diabetes mellitus without complication (HCC)   . Dyslipidemia 09/27/2011  . History of vasculitis 09/27/2011  . Hypertension 09/27/2011  . Osteoporosis 09/27/2011  . Pacemaker   . PUD (peptic ulcer disease)   . Rheumatoid arthritis(714.0) 09/27/2011    Past Surgical History:  Procedure Laterality Date  . ABDOMINAL HYSTERECTOMY    . ANKLE SURGERY    . APPENDECTOMY    . Carpal tunnel    . CHOLECYSTECTOMY    . PARTIAL GASTRECTOMY      Social History   Socioeconomic History  . Marital status: Widowed    Spouse name: Not on file  . Number of  children: 7  . Years of education: Not on file  . Highest education level: Not on file  Occupational History  . Not on file  Social Needs  . Financial resource strain: Not on file  . Food insecurity:    Worry: Not on file    Inability: Not on file  . Transportation needs:    Medical: Not on file    Non-medical: Not on file  Tobacco Use  . Smoking status: Never Smoker  . Smokeless tobacco: Never Used  Substance and Sexual Activity  . Alcohol use: Yes    Alcohol/week: 0.0 standard drinks    Comment: Occasional  . Drug use:  No  . Sexual activity: Not on file  Lifestyle  . Physical activity:    Days per week: Not on file    Minutes per session: Not on file  . Stress: Not on file  Relationships  . Social connections:    Talks on phone: Not on file    Gets together: Not on file    Attends religious service: Not on file    Active member of club or organization: Not on file    Attends meetings of clubs or organizations: Not on file    Relationship status: Not on file  . Intimate partner violence:    Fear of current or ex partner: Not on file    Emotionally abused: Not on file    Physically abused: Not on file    Forced sexual activity: Not on file  Other Topics Concern  . Not on file  Social History Narrative  . Not on file    Family History  Problem Relation Age of Onset  . Hypertension Mother   . Cancer Brother   . Cancer Brother     ROS: no fevers or chills, productive cough, hemoptysis, dysphasia, odynophagia, melena, hematochezia, dysuria, hematuria, rash, seizure activity, orthopnea, PND, pedal edema, claudication. Remaining systems are negative.  Physical Exam: Well-developed well-nourished in no acute distress.  Skin is warm and dry.  HEENT is normal.  Neck is supple.  Chest is clear to auscultation with normal expansion.  Cardiovascular exam is irregular Abdominal exam nontender or distended. No masses palpated. Extremities show 3+ edema and chronic skin changes. neuro grossly intact  ECG-atrial fibrillation at a rate of 54.  Right bundle branch block.  Left anterior fascicular block.  Lateral T wave inversion.  Personally reviewed  A/P  1 paroxysmal atrial fibrillation-patient is in atrial fibrillation today.  Her rate is controlled on no medications and she discontinued beta-blockade on her own.  She also discontinued her Eliquis.  I will resume at 5 mg twice daily.  Given history of noncompliance we will plan rate control for now.  Will likely need a 24-hour Holter monitor in the  future to make sure rate is controlled.  2 prior pacemaker-followed by electrophysiology.  3 hypertension-patient's blood pressure is controlled.  She recently discontinued all of her medications.  We will follow her blood pressure and adjust regimen as needed.  4 coronary artery disease-she occasionally has atypical chest pain.  Previous nuclear study negative.  No plans for further ischemia evaluation at this point.  5 mild carotid artery disease-plan to continue medical therapy.  No aspirin given need for anticoagulation.  Continue statin.  6 acute on chronic diastolic congestive heart failure/lower extremity edema-patient discontinued her Lasix 3 months ago.  Her edema is much worse.  She would prefer outpatient therapy.  We will treat with Lasix 80  mg twice daily.  We discussed the importance of fluid restriction and low-sodium diet.  She will see 1 of our APP's in 1 week.  Check potassium and renal function as well as CBC at that time.  If she does not improve would likely need admission and IV diuresis.  I will see her back in 6 to 8 weeks.  7 Noncompliance  Olga Millers, MD

## 2017-12-02 ENCOUNTER — Encounter: Payer: Self-pay | Admitting: Cardiology

## 2017-12-02 ENCOUNTER — Ambulatory Visit: Payer: Medicare Other | Admitting: Cardiology

## 2017-12-02 VITALS — BP 132/80 | HR 54 | Ht 60.0 in | Wt 270.7 lb

## 2017-12-02 DIAGNOSIS — I482 Chronic atrial fibrillation, unspecified: Secondary | ICD-10-CM | POA: Diagnosis not present

## 2017-12-02 DIAGNOSIS — I480 Paroxysmal atrial fibrillation: Secondary | ICD-10-CM

## 2017-12-02 DIAGNOSIS — I10 Essential (primary) hypertension: Secondary | ICD-10-CM

## 2017-12-02 DIAGNOSIS — I2583 Coronary atherosclerosis due to lipid rich plaque: Secondary | ICD-10-CM

## 2017-12-02 DIAGNOSIS — Z950 Presence of cardiac pacemaker: Secondary | ICD-10-CM | POA: Diagnosis not present

## 2017-12-02 DIAGNOSIS — I2510 Atherosclerotic heart disease of native coronary artery without angina pectoris: Secondary | ICD-10-CM

## 2017-12-02 DIAGNOSIS — I5033 Acute on chronic diastolic (congestive) heart failure: Secondary | ICD-10-CM

## 2017-12-02 MED ORDER — FUROSEMIDE 40 MG PO TABS: 80.0000 mg | ORAL_TABLET | Freq: Two times a day (BID) | ORAL | 2 refills | Status: DC

## 2017-12-02 MED ORDER — APIXABAN 5 MG PO TABS: 5.0000 mg | ORAL_TABLET | Freq: Two times a day (BID) | ORAL | 6 refills | Status: DC

## 2017-12-02 NOTE — Patient Instructions (Signed)
Medication Instructions:  START FUROSEMIDE 80 MG TWICE DAILY= 2 OF THE 40 MG TABLETS TWICE DAILY  START ELIQUIS 5 MG TWICE DAILY If you need a refill on your cardiac medications before your next appointment, please call your pharmacy.   Lab work: Your physician recommends that you return for lab work in: ONE WEEK WHEN SEEN If you have labs (blood work) drawn today and your tests are completely normal, you will receive your results only by: Marland Kitchen MyChart Message (if you have MyChart) OR . A paper copy in the mail If you have any lab test that is abnormal or we need to change your treatment, we will call you to review the results.  Follow-Up: Your physician recommends that you schedule a follow-up appointment in: ONE WEEK WITH APP  Your physician recommends that you schedule a follow-up appointment in: 8 WEEKS WITH DR [name]

## 2017-12-09 ENCOUNTER — Encounter: Payer: Self-pay | Admitting: Cardiology

## 2017-12-09 ENCOUNTER — Ambulatory Visit: Payer: Medicare Other | Admitting: Cardiology

## 2017-12-09 DIAGNOSIS — G5603 Carpal tunnel syndrome, bilateral upper limbs: Secondary | ICD-10-CM | POA: Insufficient documentation

## 2017-12-09 DIAGNOSIS — Z7901 Long term (current) use of anticoagulants: Secondary | ICD-10-CM

## 2017-12-09 DIAGNOSIS — Z950 Presence of cardiac pacemaker: Secondary | ICD-10-CM

## 2017-12-09 DIAGNOSIS — M069 Rheumatoid arthritis, unspecified: Secondary | ICD-10-CM | POA: Insufficient documentation

## 2017-12-09 DIAGNOSIS — I5033 Acute on chronic diastolic (congestive) heart failure: Secondary | ICD-10-CM

## 2017-12-09 DIAGNOSIS — I482 Chronic atrial fibrillation, unspecified: Secondary | ICD-10-CM | POA: Diagnosis not present

## 2017-12-09 DIAGNOSIS — Z9114 Patient's other noncompliance with medication regimen: Secondary | ICD-10-CM

## 2017-12-09 DIAGNOSIS — M109 Gout, unspecified: Secondary | ICD-10-CM | POA: Insufficient documentation

## 2017-12-09 MED ORDER — METOLAZONE 2.5 MG PO TABS: 2.5000 mg | ORAL_TABLET | Freq: Every day | ORAL | 0 refills | Status: DC

## 2017-12-09 NOTE — Assessment & Plan Note (Signed)
BMI 53- she admits she had a positive sleep study some years ago

## 2017-12-09 NOTE — Assessment & Plan Note (Addendum)
Pt may have some underlying psychological issues-anxiety, depression, ? PTSD (see Dr Lubertha Basque last office note). Unfortunately she reports she doesn't have a good relationship with her PCP.

## 2017-12-09 NOTE — Assessment & Plan Note (Signed)
VR 50-s

## 2017-12-09 NOTE — Assessment & Plan Note (Signed)
On Eliquis- CHADS VASC= 7

## 2017-12-09 NOTE — Assessment & Plan Note (Signed)
Pt has been on Lasix 80 mg BID- she would like to avoid admission but will consider that if she doesn't improve.

## 2017-12-09 NOTE — Progress Notes (Signed)
12/09/2017 Katherine Cervantes   [birth year]  [id number]  Primary Physician Katherine Nielsen, DO Primary Cardiologist: Katherine Katherine Cervantes  HPI:   Katherine Cervantes is a pleasant 84 y/o female who has been followed by Katherine Katherine Cervantes and Katherine Katherine Cervantes. She had been followed in [place] till 2016.  She has a Charity fundraiser and had a low risk Myoview in 2011 in [place]. Her last echo in Aug 2018 showed preserved LVF.  The pt is a widow, she lives alone in her own home.  Her husband of 50 years passed in 2002.  She has seven children, several still in this area.  In Sept 2019 she presented to the ED with LE edema and cellulitis.  She had stopped all her medication some time ago. She had been active when younger but admits "I've lost the will to do anything".  She was placed on Lasix, antibiotics, and Eliquis. She saw Katherine Katherine Cervantes on 12/02/17 and is back today for follow up. She admits her edema is better though she still has weeping LE edema on exam. She has DOE- chronic.     Current Outpatient Medications  Medication Sig Dispense Refill  . apixaban (ELIQUIS) 5 MG TABS tablet Take 1 tablet (5 mg total) by mouth 2 (two) times daily. 60 tablet 6  . furosemide (LASIX) 40 MG tablet Take 2 tablets (80 mg total) by mouth 2 (two) times daily. 180 tablet 2  . nitroGLYCERIN (NITROSTAT) 0.4 MG SL tablet Place 1 tablet (0.4 mg total) under the tongue every 5 (five) minutes as needed for chest pain (MAX 3 TABLETS). 25 tablet 3  . traMADol-acetaminophen (ULTRACET) 37.5-325 MG tablet TAKE ONE TABLET BY MOUTH EVERY 8 HOURS AS NEEDED FOR ARTHRITIS PAIN 45 tablet 0  . metolazone (ZAROXOLYN) 2.5 MG tablet Take 1 tablet (2.5 mg total) by mouth daily. 10 tablet 0   No current facility-administered medications for this visit.     Allergies  Allergen Reactions  . Aspirin Anaphylaxis  . Other Anaphylaxis  . Penicillins Anaphylaxis    Anaphylaxis  . Codeine Other (See Comments)    SOB Asthmatic issues  . Morphine And Related  Other (See Comments)    Anaphylaxis  . Sulfa Antibiotics Rash  . Ether   . Levaquin [Levofloxacin In D5w] Other (See Comments)    Disoriented, Hallucinations  . Metoprolol     Possible  . Prednisone Other (See Comments)    Lethargic, rash  . Procaine     Past Medical History:  Diagnosis Date  . Asthma   . Atrial fibrillation (HCC) 09/27/2011  . CAD (coronary artery disease) 09/27/2011  . Diabetes mellitus without complication (HCC)   . Dyslipidemia 09/27/2011  . History of vasculitis 09/27/2011  . Hypertension 09/27/2011  . Osteoporosis 09/27/2011  . Pacemaker   . PUD (peptic ulcer disease)   . Rheumatoid arthritis(714.0) 09/27/2011    Social History   Socioeconomic History  . Marital status: Widowed    Spouse name: Not on file  . Number of children: 7  . Years of education: Not on file  . Highest education level: Not on file  Occupational History  . Not on file  Social Needs  . Financial resource strain: Not on file  . Food insecurity:    Worry: Not on file    Inability: Not on file  . Transportation needs:    Medical: Not on file    Non-medical: Not on file  Tobacco Use  . Smoking status: Never Smoker  .  Smokeless tobacco: Never Used  Substance and Sexual Activity  . Alcohol use: Yes    Alcohol/week: 0.0 standard drinks    Comment: Occasional  . Drug use: No  . Sexual activity: Not on file  Lifestyle  . Physical activity:    Days per week: Not on file    Minutes per session: Not on file  . Stress: Not on file  Relationships  . Social connections:    Talks on phone: Not on file    Gets together: Not on file    Attends religious service: Not on file    Active member of club or organization: Not on file    Attends meetings of clubs or organizations: Not on file    Relationship status: Not on file  . Intimate partner violence:    Fear of current or ex partner: Not on file    Emotionally abused: Not on file    Physically abused: Not on file    Forced  sexual activity: Not on file  Other Topics Concern  . Not on file  Social History Narrative  . Not on file     Family History  Problem Relation Age of Onset  . Hypertension Mother   . Cancer Brother   . Cancer Brother      Review of Systems: General: negative for chills, fever, night sweats or weight changes.  Cardiovascular: negative for chest pain, orthopnea, palpitations, paroxysmal nocturnal dyspnea Dermatological: negative for rash Respiratory: negative for cough or wheezing Urologic: negative for hematuria Abdominal: negative for nausea, vomiting, diarrhea, bright red blood per rectum, melena, or hematemesis Neurologic: negative for visual changes, syncope, or dizziness All other systems reviewed and are otherwise negative except as noted above.    Blood pressure (!) 160/82, pulse 65, height 5' (1.524 m), weight 273 lb (123.8 kg), SpO2 94 %.  General appearance: alert, cooperative, no distress and morbidly obese Lungs: decreased breath sounds Heart: irregularly irregular rhythm Extremities: 2-3+ bilateral edema with weeping blisters Skin: pale, dry Neurologic: Grossly normal  EKG 12/03/17- AF with VR 54  ASSESSMENT AND PLAN:   Acute on chronic diastolic heart failure (HCC) Pt has been on Lasix 80 mg BID- she would like to avoid admission but will consider that if she doesn't improve.   Atrial fibrillation, chronic (HCC) VR 50-s  History of cardiac pacemaker 2/2 [employer] H/O St Jude pacemaker-followed by Katherine Katherine Cervantes  Morbid obesity (HCC) BMI 53- she admits she had a positive sleep study some years ago  Chronic anticoagulation On Eliquis- CHADS VASC= 7  Non compliance w medication regimen Pt may have some underlying psychological issues-anxiety, depression, ? PTSD (see Katherine Cervantes last office note). Unfortunately she reports she doesn't have a good relationship with her PCP.    PLAN  I suggested we add Zaroxolyn for 4 doses. I'll see her back in a few days. She  agrees to admission for IV diuresis if not significantly improved. If admitted she should be worked up for diabetes and depression.   Katherine Shelter PA-C 12/09/2017 4:33 PM

## 2017-12-09 NOTE — Assessment & Plan Note (Signed)
H/O St Jude pacemaker-followed by Dr Ladona Ridgel

## 2017-12-09 NOTE — Patient Instructions (Addendum)
Medication Instructions:  START Zaroxlyn Take 1 tablet tonight and one tablet every morning for 3 additional days then STOP If you need a refill on your cardiac medications before your next appointment, please call your pharmacy.   Lab work: None  If you have labs (blood work) drawn today and your tests are completely normal, you will receive your results only by: [facility] MyChart Message (if you have MyChart) OR . A paper copy in the mail If you have any lab test that is abnormal or we need to change your treatment, we will call you to review the results.  Testing/Procedures: None   Follow-Up: At [facility], you and your health needs are our priority.  As part of our continuing mission to provide you with exceptional heart care, we have created designated Provider Care Teams.  These Care Teams include your primary Cardiologist (physician) and Advanced Practice Providers (APPs -  Physician Assistants and Nurse Practitioners) who all work together to provide you with the care you need, when you need it. . Follow up Monday   Any Other Special Instructions Will Be Listed Below (If Applicable).

## 2017-12-10 ENCOUNTER — Telehealth: Payer: Self-pay | Admitting: Cardiology

## 2017-12-10 NOTE — Telephone Encounter (Signed)
New message   Patient's daughter wants to know that if the medicine that was prescribed on yesterday does not work will the patient be admitted into the hospital?  Please call to discuss.

## 2017-12-10 NOTE — Telephone Encounter (Signed)
Called patient as daughter is not on DPR. Patient states she informed her daughter of the visit info from yesterday and she is able to care for herself.   Per PA note (which patient is clear about and understands):  Acute on chronic diastolic heart failure (HCC) Pt has been on Lasix 80 mg BID- she would like to avoid admission but will consider that if she doesn't improve.   She has f/up 11/4

## 2017-12-15 ENCOUNTER — Telehealth: Payer: Self-pay | Admitting: Cardiology

## 2017-12-15 ENCOUNTER — Ambulatory Visit: Payer: Medicare Other | Admitting: Cardiology

## 2017-12-15 ENCOUNTER — Telehealth: Payer: Self-pay | Admitting: *Deleted

## 2017-12-15 NOTE — Telephone Encounter (Signed)
Open error 

## 2017-12-15 NOTE — Telephone Encounter (Signed)
Patient was scheduled to see Franky Macho PA today 12/15/17 Her daughter is not listed on DPR - only patient is listed.   Spoke with patient who reiterated that she only wants her medical conditions discussed with her. Rescheduled her 11/4 cancelled appointment for 11/6 with [facility] PA @ 11am

## 2017-12-15 NOTE — Telephone Encounter (Signed)
New Message          Patient's daughter called today, she is concerned about her mother. Patient's daughter is asking how serious is her mothers medical condition. Patient's daughter would like a call back concerning this matter.

## 2017-12-15 NOTE — Telephone Encounter (Signed)
Spoke daughter cathey-  ( [facility] po[facility])  informed her patient last office visit -  Medication of metolazone 2.5 mg  Take for 4 before morning dose of lasix days then stop.  daughter wanted to know  If patient needs come to office appointment sooner since she cancelled appointment for today.   RN  Informed daughter -  New medication started  And follow up is needed to evaluate how patient's swelling is doing.  suggest check to see patient has taken metolazone --weigh daily and keep record -- amt schedule for 12/24/17 if patient can not make 12/17/17 appointment.  daughter states she will discuss with patient - medication and follow up appointments

## 2017-12-17 ENCOUNTER — Ambulatory Visit: Payer: Medicare Other | Admitting: Cardiology

## 2017-12-18 NOTE — Telephone Encounter (Signed)
Note opened in error.

## 2017-12-24 ENCOUNTER — Encounter: Payer: Self-pay | Admitting: Cardiology

## 2017-12-24 ENCOUNTER — Encounter: Payer: Self-pay | Admitting: Adult Health

## 2017-12-24 ENCOUNTER — Ambulatory Visit: Payer: Medicare Other | Admitting: Adult Health

## 2017-12-24 VITALS — BP 136/92 | HR 81 | Ht 60.0 in | Wt 254.4 lb

## 2017-12-24 DIAGNOSIS — E7800 Pure hypercholesterolemia, unspecified: Secondary | ICD-10-CM | POA: Diagnosis not present

## 2017-12-24 DIAGNOSIS — Z79899 Other long term (current) drug therapy: Secondary | ICD-10-CM | POA: Diagnosis not present

## 2017-12-24 DIAGNOSIS — L03116 Cellulitis of left lower limb: Secondary | ICD-10-CM | POA: Diagnosis not present

## 2017-12-24 DIAGNOSIS — I5032 Chronic diastolic (congestive) heart failure: Secondary | ICD-10-CM

## 2017-12-24 LAB — LIPID PANEL
CHOL/HDL RATIO: 2.5 ratio (ref 0.0–4.4)
Cholesterol, Total: 157 mg/dL (ref 100–199)
HDL: 63 mg/dL (ref >39)
LDL Calculated: 78 mg/dL (ref 0–99)
Triglycerides: 78 mg/dL (ref 0–149)
VLDL Cholesterol Cal: 16 mg/dL (ref 5–40)

## 2017-12-24 LAB — HEPATIC FUNCTION PANEL
ALBUMIN: 4.4 g/dL (ref 3.5–4.7)
ALT: 14 IU/L (ref 0–32)
AST: 20 IU/L (ref 0–40)
Alkaline Phosphatase: 61 IU/L (ref 39–117)
BILIRUBIN TOTAL: 0.7 mg/dL (ref 0.0–1.2)
Bilirubin, Direct: 0.25 mg/dL (ref 0.00–0.40)
Total Protein: 7.9 g/dL (ref 6.0–8.5)

## 2017-12-24 LAB — BASIC METABOLIC PANEL
BUN / CREAT RATIO: 23 (ref 12–28)
BUN: 26 mg/dL (ref 8–27)
CO2: 23 mmol/L (ref 20–29)
Calcium: 9.8 mg/dL (ref 8.7–10.3)
Chloride: 94 mmol/L — ABNORMAL LOW (ref 96–106)
Creatinine, Ser: 1.11 mg/dL — ABNORMAL HIGH (ref 0.57–1.00)
GFR calc Af Amer: 52 mL/min/{1.73_m2} — ABNORMAL LOW (ref >59)
GFR, EST NON AFRICAN AMERICAN: 45 mL/min/{1.73_m2} — AB (ref >59)
Glucose: 119 mg/dL — ABNORMAL HIGH (ref 65–99)
Potassium: 3.5 mmol/L (ref 3.5–5.2)
SODIUM: 138 mmol/L (ref 134–144)

## 2017-12-24 MED ORDER — NITROGLYCERIN 0.4 MG SL SUBL: 0.4000 mg | SUBLINGUAL_TABLET | SUBLINGUAL | 3 refills | Status: AC | PRN

## 2017-12-24 MED ORDER — CLINDAMYCIN HCL 150 MG PO CAPS: 150.0000 mg | ORAL_CAPSULE | Freq: Three times a day (TID) | ORAL | 0 refills | Status: DC

## 2017-12-24 NOTE — Progress Notes (Signed)
Cardiology Office Note   Date:  12/24/2017   ID:  Katherine Cervantes, DOB 17-Jun-1929, MRN [id number]  PCP:  Sunnie Nielsen, DO  Cardiologist:  Winfield Rast  EP: Dr.Taylor  No chief complaint on file.    History of Present Illness: Katherine Cervantes is a 84 y.o. female who presents for ongoing assessment and management of atrial fibrillation on Eliquis, St Jude PPM in situ, acute on chronic diastolic CHF, non-compliance with medical regimen. When last seen on 12/09/2017 by Corine Shelter, PA, she was given zaroxolyn for 4 doses due to fluid overload. She was follow up in a few days.   She comes today having lost 20 lbs and is breathing and feeling better. She still has some LEE edema. She also has some open sores that are weeping now. She is medically compliant but does not always take her lasix at full doses, sometimes talking 20 mg instead of 40 mg, depending on how she feels. No complaints of bleeding or excessive bruising.    Past Medical History:  Diagnosis Date  . Asthma   . Atrial fibrillation (HCC) 09/27/2011  . CAD (coronary artery disease) 09/27/2011  . Diabetes mellitus without complication (HCC)   . Dyslipidemia 09/27/2011  . History of vasculitis 09/27/2011  . Hypertension 09/27/2011  . Osteoporosis 09/27/2011  . Pacemaker   . PUD (peptic ulcer disease)   . Rheumatoid arthritis(714.0) 09/27/2011    Past Surgical History:  Procedure Laterality Date  . ABDOMINAL HYSTERECTOMY    . ANKLE SURGERY    . APPENDECTOMY    . Carpal tunnel    . CHOLECYSTECTOMY    . PARTIAL GASTRECTOMY       Current Outpatient Medications  Medication Sig Dispense Refill  . apixaban (ELIQUIS) 5 MG TABS tablet Take 1 tablet (5 mg total) by mouth 2 (two) times daily. 60 tablet 6  . furosemide (LASIX) 40 MG tablet Take 2 tablets (80 mg total) by mouth 2 (two) times daily. 180 tablet 2  . metolazone (ZAROXOLYN) 2.5 MG tablet Take 1 tablet (2.5 mg total) by mouth daily. 10 tablet 0  . nitroGLYCERIN  (NITROSTAT) 0.4 MG SL tablet Place 1 tablet (0.4 mg total) under the tongue every 5 (five) minutes as needed for chest pain (MAX 3 TABLETS). 25 tablet 3  . traMADol-acetaminophen (ULTRACET) 37.5-325 MG tablet TAKE ONE TABLET BY MOUTH EVERY 8 HOURS AS NEEDED FOR ARTHRITIS PAIN 45 tablet 0   No current facility-administered medications for this visit.     Allergies:   Aspirin; Other; Penicillins; Codeine; Morphine and related; Sulfa antibiotics; Ether; Levaquin [levofloxacin in d5w]; Metoprolol; Prednisone; and Procaine    Social History:  The patient  reports that she has never smoked. She has never used smokeless tobacco. She reports that she drinks alcohol. She reports that she does not use drugs.   Family History:  The patient's family history includes Cancer in her brother and brother; Hypertension in her mother.    ROS: All other systems are reviewed and negative. Unless otherwise mentioned in H&P    PHYSICAL EXAM: VS:  There were no vitals taken for this visit. , BMI There is no height or weight on file to calculate BMI. GEN: Well nourished, well developed, in no acute distress, obese  HEENT: normal Neck: no JVD, carotid bruits, or masses Cardiac: RRR; no murmurs, rubs, or gallops,no edema  Respiratory:  Clear to auscultation bilaterally, normal work of breathing, some upper airway crackles.  GI: soft, nontender, nondistended, + BS MS:  no deformity or atrophy Skin: warm and dry, no rash, erythema and redness with open sores from skin splitting, with serous drainage.  Neuro:  Strength and sensation are intact Psych: euthymic mood, full affect   EKG:  Not completed this office visit.   Recent Labs: 10/27/2017: ALT 12; BUN 19; Creat 0.89; Hemoglobin 12.7; Platelets 192; Potassium 3.3; Sodium 140    Lipid Panel    Component Value Date/Time   CHOL 214 (H) 10/13/2015 1115   TRIG 141 10/13/2015 1115   HDL 66 10/13/2015 1115   CHOLHDL 3.2 10/13/2015 1115   VLDL 28 10/13/2015  1115   LDLCALC 120 10/13/2015 1115      Wt Readings from Last 3 Encounters:  12/09/17 273 lb (123.8 kg)  12/02/17 270 lb 11.2 oz (122.8 kg)  10/27/17 212 lb (96.2 kg)      Other studies Reviewed: Echocardiogram 2017-10-10 Left ventricle: The cavity size was normal. There was severe   concentric hypertrophy. Systolic function was normal. The   estimated ejection fraction was in the range of 50% to 55%. Wall   motion was normal; there were no regional wall motion   abnormalities. Doppler parameters are consistent with restrictive   physiology, indicative of decreased left ventricular diastolic   compliance and/or increased left atrial pressure. Doppler   parameters are consistent with elevated ventricular end-diastolic   filling pressure. - Aortic valve: There was no regurgitation. - Mitral valve: Calcified annulus. Mildly thickened leaflets . A   prosthesis was present and functioning normally. The prosthesis   had a normal range of motion. The sewing ring appeared normal,   had no rocking motion, and showed no evidence of dehiscence. - Left atrium: The atrium was mildly dilated. - Right ventricle: The cavity size was normal. Wall thickness was   normal. Systolic function was normal. - Right atrium: The atrium was normal in size. - Tricuspid valve: There was trivial regurgitation. - Pulmonary arteries: The main pulmonary artery was normal-sized.   Systolic pressure was within the normal range. - Inferior vena cava: The vessel was normal in size. - Pericardium, extracardiac: There was no pericardial effusion.  Impressions:  - The endocardium visualization is limited. Additional images with   Definity echocontrast are recommended. Overall LVEF 55%.  ASSESSMENT AND PLAN:  1.  Acute on Chronic Diastolic CHF:  She has lost 20 lbs with use of Zaroxolyn and continues on lasix 40 mg BID. She admits that she doesn't always take the lasix at full dose every day, she gauges her  lasix as to how she feels. She does not have a scale. I have advised her to take her medications as directed and not to depend on how she feels. She is to buy a scale and weigh daily. BMET will be drawn today.   2. LEE cellulitis of the left lower extremity: She has multiple drug allergies. She will be placed on cleocin 150 mg TID for 10 days. She has a dressing placed in the clinic today. She will follow up with [place] in two weeks.  3. Hypercholesterolemia: She has not had labs drawn for this in 2 years. I will have lipids and LFT's drawn today.   Current medicines are reviewed at length with the patient today.    Labs/ tests ordered today include: BMET and Lipid panel with LFTs.  Bettey Mare. Liborio Nixon, ANP, AACC   12/24/2017 6:59 AM    [facility] [address] Office [phone] Fax [phone])  [phone] 

## 2017-12-24 NOTE — Patient Instructions (Signed)
Medication Instructions:  TAKE CLEOCIN 150MG  THREE TIMES DAILY FOR 10 DAYS  If you need a refill on your cardiac medications before your next appointment, please call your pharmacy.  Labwork: BMET, LFT AND LIPID TODAY HERE IN OUR OFFICE AT [facility]  If you have labs (blood work) drawn today and your tests are completely normal, you will receive your results only by: [facility] MyChart Message (if you have MyChart) OR . A paper copy in the mail If you have any lab test that is abnormal or we need to change your treatment, we will call you to review the results.  Follow-Up: You will need a follow up appointment in 12-30-2017 @ 1130AM WITH 01-01-2018.   At [facility], you and your health needs are our priority.  As part of our continuing mission to provide you with exceptional heart care, we have created designated Provider Care Teams.  These Care Teams include your primary Cardiologist (physician) and Advanced Practice Providers (APPs -  Physician Assistants and Nurse Practitioners) who all work together to provide you with the care you need, when you need it.  Thank you for choosing [facility] at [facility]!!

## 2017-12-30 ENCOUNTER — Ambulatory Visit: Payer: Medicare Other | Admitting: Cardiology

## 2018-01-06 ENCOUNTER — Ambulatory Visit: Payer: Medicare Other | Admitting: Adult Health

## 2018-01-07 ENCOUNTER — Ambulatory Visit: Payer: Medicare Other | Admitting: Adult Health

## 2018-01-19 NOTE — Progress Notes (Deleted)
Subjective:   Katherine Cervantes is a 84 y.o. female who presents for an Initial Medicare Annual Wellness Visit.  Review of Systems    No ROS.  Medicare Wellness Visit. Additional risk factors are reflected in the social history.     Sleep patterns:  Home Safety/Smoke Alarms: Feels safe in home. Smoke alarms in place.  Living environment;  Seat Belt Safety/Bike Helmet: Wears seat belt.   Female:   Pap-  Aged out     Mammo-       Dexa scan-        CCS-      Objective:    There were no vitals filed for this visit. There is no height or weight on file to calculate BMI.  Advanced Directives 12/06/2016 10/03/2016  Does Patient Have a Medical Advance Directive? Yes No;Yes  Type of Advance Directive Healthcare Power of Attorney -  Does patient want to make changes to medical advance directive? Yes (MAU/Ambulatory/Procedural Areas - Information given) -  Copy of Healthcare Power of Attorney in Chart? No - copy requested -    Current Medications (verified) Outpatient Encounter Medications as of 01/27/2018  Medication Sig  . apixaban (ELIQUIS) 5 MG TABS tablet Take 1 tablet (5 mg total) by mouth 2 (two) times daily.  . clindamycin (CLEOCIN) 150 MG capsule Take 1 capsule (150 mg total) by mouth 3 (three) times daily.  . furosemide (LASIX) 40 MG tablet Take 2 tablets (80 mg total) by mouth 2 (two) times daily. (Patient taking differently: Take 80 mg by mouth daily. )  . nitroGLYCERIN (NITROSTAT) 0.4 MG SL tablet Place 1 tablet (0.4 mg total) under the tongue every 5 (five) minutes as needed for chest pain (MAX 3 TABLETS).  . traMADol-acetaminophen (ULTRACET) 37.5-325 MG tablet TAKE ONE TABLET BY MOUTH EVERY 8 HOURS AS NEEDED FOR ARTHRITIS PAIN   No facility-administered encounter medications on file as of 01/27/2018.     Allergies (verified) Aspirin; Other; Penicillins; Codeine; Morphine and related; Sulfa antibiotics; Ether; Levaquin [levofloxacin in d5w]; Metoprolol;  Prednisone; and Procaine   History: Past Medical History:  Diagnosis Date  . Asthma   . Atrial fibrillation (HCC) 09/27/2011  . CAD (coronary artery disease) 09/27/2011  . Diabetes mellitus without complication (HCC)   . Dyslipidemia 09/27/2011  . History of vasculitis 09/27/2011  . Hypertension 09/27/2011  . Osteoporosis 09/27/2011  . Pacemaker   . PUD (peptic ulcer disease)   . Rheumatoid arthritis(714.0) 09/27/2011   Past Surgical History:  Procedure Laterality Date  . ABDOMINAL HYSTERECTOMY    . ANKLE SURGERY    . APPENDECTOMY    . Carpal tunnel    . CHOLECYSTECTOMY    . PARTIAL GASTRECTOMY     Family History  Problem Relation Age of Onset  . Hypertension Mother   . Cancer Brother   . Cancer Brother    Social History   Socioeconomic History  . Marital status: Widowed    Spouse name: Not on file  . Number of children: 7  . Years of education: Not on file  . Highest education level: Not on file  Occupational History  . Not on file  Social Needs  . Financial resource strain: Not on file  . Food insecurity:    Worry: Not on file    Inability: Not on file  . Transportation needs:    Medical: Not on file    Non-medical: Not on file  Tobacco Use  . Smoking status: Never Smoker  .  Smokeless tobacco: Never Used  Substance and Sexual Activity  . Alcohol use: Yes    Alcohol/week: 0.0 standard drinks    Comment: Occasional  . Drug use: No  . Sexual activity: Not on file  Lifestyle  . Physical activity:    Days per week: Not on file    Minutes per session: Not on file  . Stress: Not on file  Relationships  . Social connections:    Talks on phone: Not on file    Gets together: Not on file    Attends religious service: Not on file    Active member of club or organization: Not on file    Attends meetings of clubs or organizations: Not on file    Relationship status: Not on file  Other Topics Concern  . Not on file  Social History Narrative  . Not on file     Tobacco Counseling Counseling given: Not Answered   Clinical Intake:                        Activities of Daily Living No flowsheet data found.   Immunizations and Health Maintenance  There is no immunization history on file for this patient. Health Maintenance Due  Topic Date Due  . OPHTHALMOLOGY EXAM  03/10/1939  . DEXA SCAN  03/10/1996  . FOOT EXAM  08/24/2015  . HEMOGLOBIN A1C  03/20/2016  . URINE MICROALBUMIN  04/19/2016    Patient Care Team: Sunnie Nielsen, DO as PCP - General (Osteopathic Medicine) Ernestene Kiel MD as Attending Physician (Cardiology)  Indicate any recent Medical Services you may have received from other than [facility] providers in the past year (date may be approximate).     Assessment:   This is a routine wellness examination for [facility].Physical assessment deferred to PCP.   Hearing/Vision screen No exam data present  Dietary issues and exercise activities discussed:   Diet  Breakfast: Lunch:  Dinner:       Goals   None    Depression Screen PHQ 2/9 Scores 12/06/2016 03/19/2013  PHQ - 2 Score 2 2  PHQ- 9 Score 3 -    Fall Risk Fall Risk  12/06/2016 03/19/2013  Falls in the past year? No Yes  Risk for fall due to : History of fall(s);Impaired mobility -    Is the patient's home free of loose throw rugs in walkways, pet beds, electrical cords, etc?   {Blank single:19197::"yes","no"}      Grab bars in the bathroom? {Blank single:19197::"yes","no"}      Handrails on the stairs?   {Blank single:19197::"yes","no"}      Adequate lighting?   {Blank single:19197::"yes","no"}   Cognitive Function:        Screening Tests Health Maintenance  Topic Date Due  . OPHTHALMOLOGY EXAM  03/10/1939  . DEXA SCAN  03/10/1996  . FOOT EXAM  08/24/2015  . HEMOGLOBIN A1C  03/20/2016  . URINE MICROALBUMIN  04/19/2016  . INFLUENZA VACCINE  02/10/2018 (Originally 09/11/2017)  . TETANUS/TDAP  10/31/2018 (Originally 03/10/1948)  . PNA  vac Low Risk Adult (1 of 2 - PCV13) 10/12/2025 (Originally 03/10/1996)      Plan:   ***  I have personally reviewed and noted the following in the patient's chart:   . Medical and social history . Use of alcohol, tobacco or illicit drugs  . Current medications and supplements . Functional ability and status . Nutritional status . Physical activity . Advanced directives . List of other physicians .  Hospitalizations, surgeries, and ER visits in previous 12 months . Vitals . Screenings to include cognitive, depression, and falls . Referrals and appointments  In addition, I have reviewed and discussed with patient certain preventive protocols, quality metrics, and best practice recommendations. A written personalized care plan for preventive services as well as general preventive health recommendations were provided to patient.     Normand Sloop, LPN   [id number]

## 2018-01-21 NOTE — Progress Notes (Signed)
HPI: FU atrial fibrillation/flutter and diastolic CHF. Patient has had previous care in [place]. Previous cath but no records available. Previous atrial flutter ablation and pacemaker. Nuclear study September 2011 showed ejection fraction 76% and no ischemia or infarction. Carotid dopplers 3/17 showed 1-39 bilateral stenosis.Echo 8/18 showed EF 50-55, mild LAE.Since last seen,  patient notes dyspnea on exertion, orthopnea but no PND.  She has bilateral lower extremity pedal edema with erythema.  Occasional chest pain not exertional.  No palpitations, syncope or bleeding.  Current Outpatient Medications  Medication Sig Dispense Refill  . apixaban (ELIQUIS) 5 MG TABS tablet Take 1 tablet (5 mg total) by mouth 2 (two) times daily. 60 tablet 6  . clindamycin (CLEOCIN) 150 MG capsule Take 1 capsule (150 mg total) by mouth 3 (three) times daily. 30 capsule 0  . furosemide (LASIX) 40 MG tablet Take 2 tablets (80 mg total) by mouth 2 (two) times daily. (Patient taking differently: Take 80 mg by mouth daily. ) 180 tablet 2  . nitroGLYCERIN (NITROSTAT) 0.4 MG SL tablet Place 1 tablet (0.4 mg total) under the tongue every 5 (five) minutes as needed for chest pain (MAX 3 TABLETS). 25 tablet 3  . traMADol-acetaminophen (ULTRACET) 37.5-325 MG tablet TAKE ONE TABLET BY MOUTH EVERY 8 HOURS AS NEEDED FOR ARTHRITIS PAIN 45 tablet 0   No current facility-administered medications for this visit.      Past Medical History:  Diagnosis Date  . Asthma   . Atrial fibrillation (HCC) 09/27/2011  . CAD (coronary artery disease) 09/27/2011  . Diabetes mellitus without complication (HCC)   . Dyslipidemia 09/27/2011  . History of vasculitis 09/27/2011  . Hypertension 09/27/2011  . Osteoporosis 09/27/2011  . Pacemaker   . PUD (peptic ulcer disease)   . Rheumatoid arthritis(714.0) 09/27/2011    Past Surgical History:  Procedure Laterality Date  . ABDOMINAL HYSTERECTOMY    . ANKLE SURGERY    . APPENDECTOMY      . Carpal tunnel    . CHOLECYSTECTOMY    . PARTIAL GASTRECTOMY      Social History   Socioeconomic History  . Marital status: Widowed    Spouse name: Not on file  . Number of children: 7  . Years of education: Not on file  . Highest education level: Not on file  Occupational History  . Not on file  Social Needs  . Financial resource strain: Not on file  . Food insecurity:    Worry: Not on file    Inability: Not on file  . Transportation needs:    Medical: Not on file    Non-medical: Not on file  Tobacco Use  . Smoking status: Never Smoker  . Smokeless tobacco: Never Used  Substance and Sexual Activity  . Alcohol use: Yes    Alcohol/week: 0.0 standard drinks    Comment: Occasional  . Drug use: No  . Sexual activity: Not on file  Lifestyle  . Physical activity:    Days per week: Not on file    Minutes per session: Not on file  . Stress: Not on file  Relationships  . Social connections:    Talks on phone: Not on file    Gets together: Not on file    Attends religious service: Not on file    Active member of club or organization: Not on file    Attends meetings of clubs or organizations: Not on file    Relationship status: Not on file  .  Intimate partner violence:    Fear of current or ex partner: Not on file    Emotionally abused: Not on file    Physically abused: Not on file    Forced sexual activity: Not on file  Other Topics Concern  . Not on file  Social History Narrative  . Not on file    Family History  Problem Relation Age of Onset  . Hypertension Mother   . Cancer Brother   . Cancer Brother     ROS: Pain in knees but no fevers or chills, productive cough, hemoptysis, dysphasia, odynophagia, melena, hematochezia, dysuria, hematuria, rash, seizure activity, orthopnea, PND, claudication. Remaining systems are negative.  Physical Exam: Well-developed morbidly obese in no acute distress.  Skin is warm and dry.  HEENT is normal.  Neck is supple. No  bruits Chest is clear to auscultation with normal expansion.  Cardiovascular exam is regular rate and rhythm.  Abdominal exam nontender or distended. No masses palpated. obese Extremities show 3+edema; there is erythema and weeping. neuro grossly intact  ECG-atrial fibrillation at a rate of 59, left anterior fascicular block, right bundle branch block.  Personally reviewed  A/P  1 acute on chronic diastolic congestive heart failure-patient remains markedly volume overloaded on examination.  She is now willing to be admitted for IV diuresis.  We will admit and treat with Lasix 80 mg IV twice daily.  Fluid restrict to 1 L daily.  Follow low-sodium diet.  Follow renal function daily.  Repeat echocardiogram.  Note she has not been taking her medications and this is been a problem in the past.  2 persistent atrial fibrillation-patient's heart rate is controlled on no medications.  She has had problems with compliance in the past.  She states she has not taken her Eliquis every day.  We will resume 5 mg twice daily.  Would not consider cardioversion unless she can be persistent with apixaban.  3 Coronary artery disease-plan to continue medical therapy; resume Lipitor 40 mg daily.  No aspirin given need for anticoagulation.  4 prior pacemaker-followed by electrophysiology.  5 hypertension-patient's blood pressure is mildly elevated.  We will follow and increase medications as needed.  6 carotid artery disease-mild on most recent Dopplers.  Continue statin.  7 history of noncompliance- I discussed the importance of compliance.  8 lower extremity edema-diurese as outlined above.  We will ask the wound care team to follow.  We will continue antibiotics for possible component of cellulitis.  Olga Millers, MD

## 2018-01-27 ENCOUNTER — Ambulatory Visit: Payer: Medicare Other

## 2018-01-28 NOTE — Progress Notes (Deleted)
Subjective:   Katherine Cervantes is a 84 y.o. female who presents for an Initial Medicare Annual Wellness Visit.  Review of Systems    No ROS.  Medicare Wellness Visit. Additional risk factors are reflected in the social history.     Sleep patterns: Home Safety/Smoke Alarms: Feels safe in home. Smoke alarms in place.  Living environment; Seat Belt Safety/Bike Helmet: Wears seat belt.   Female:   Pap- aged out unless necessary       Mammo-       Dexa scan-        CCS-      Objective:    There were no vitals filed for this visit. There is no height or weight on file to calculate BMI.  Advanced Directives 12/06/2016 10/03/2016  Does Patient Have a Medical Advance Directive? Yes No;Yes  Type of Advance Directive Healthcare Power of Attorney -  Does patient want to make changes to medical advance directive? Yes (MAU/Ambulatory/Procedural Areas - Information given) -  Copy of Healthcare Power of Attorney in Chart? No - copy requested -    Current Medications (verified) Outpatient Encounter Medications as of 02/02/2018  Medication Sig  . apixaban (ELIQUIS) 5 MG TABS tablet Take 1 tablet (5 mg total) by mouth 2 (two) times daily.  . clindamycin (CLEOCIN) 150 MG capsule Take 1 capsule (150 mg total) by mouth 3 (three) times daily.  . furosemide (LASIX) 40 MG tablet Take 2 tablets (80 mg total) by mouth 2 (two) times daily. (Patient taking differently: Take 80 mg by mouth daily. )  . nitroGLYCERIN (NITROSTAT) 0.4 MG SL tablet Place 1 tablet (0.4 mg total) under the tongue every 5 (five) minutes as needed for chest pain (MAX 3 TABLETS).  . traMADol-acetaminophen (ULTRACET) 37.5-325 MG tablet TAKE ONE TABLET BY MOUTH EVERY 8 HOURS AS NEEDED FOR ARTHRITIS PAIN   No facility-administered encounter medications on file as of 02/02/2018.     Allergies (verified) Aspirin; Other; Penicillins; Codeine; Morphine and related; Sulfa antibiotics; Ether; Levaquin [levofloxacin in d5w];  Metoprolol; Prednisone; and Procaine   History: Past Medical History:  Diagnosis Date  . Asthma   . Atrial fibrillation (HCC) 09/27/2011  . CAD (coronary artery disease) 09/27/2011  . Diabetes mellitus without complication (HCC)   . Dyslipidemia 09/27/2011  . History of vasculitis 09/27/2011  . Hypertension 09/27/2011  . Osteoporosis 09/27/2011  . Pacemaker   . PUD (peptic ulcer disease)   . Rheumatoid arthritis(714.0) 09/27/2011   Past Surgical History:  Procedure Laterality Date  . ABDOMINAL HYSTERECTOMY    . ANKLE SURGERY    . APPENDECTOMY    . Carpal tunnel    . CHOLECYSTECTOMY    . PARTIAL GASTRECTOMY     Family History  Problem Relation Age of Onset  . Hypertension Mother   . Cancer Brother   . Cancer Brother    Social History   Socioeconomic History  . Marital status: Widowed    Spouse name: Not on file  . Number of children: 7  . Years of education: Not on file  . Highest education level: Not on file  Occupational History  . Not on file  Social Needs  . Financial resource strain: Not on file  . Food insecurity:    Worry: Not on file    Inability: Not on file  . Transportation needs:    Medical: Not on file    Non-medical: Not on file  Tobacco Use  . Smoking status: Never Smoker  .  Smokeless tobacco: Never Used  Substance and Sexual Activity  . Alcohol use: Yes    Alcohol/week: 0.0 standard drinks    Comment: Occasional  . Drug use: No  . Sexual activity: Not on file  Lifestyle  . Physical activity:    Days per week: Not on file    Minutes per session: Not on file  . Stress: Not on file  Relationships  . Social connections:    Talks on phone: Not on file    Gets together: Not on file    Attends religious service: Not on file    Active member of club or organization: Not on file    Attends meetings of clubs or organizations: Not on file    Relationship status: Not on file  Other Topics Concern  . Not on file  Social History Narrative  . Not on  file    Tobacco Counseling Counseling given: Not Answered   Clinical Intake:                        Activities of Daily Living No flowsheet data found.   Immunizations and Health Maintenance  There is no immunization history on file for this patient. Health Maintenance Due  Topic Date Due  . OPHTHALMOLOGY EXAM  03/10/1939  . DEXA SCAN  03/10/1996  . FOOT EXAM  08/24/2015  . HEMOGLOBIN A1C  03/20/2016  . URINE MICROALBUMIN  04/19/2016    Patient Care Team: Sunnie Nielsen, DO as PCP - General (Osteopathic Medicine) Ernestene Kiel MD as Attending Physician (Cardiology)  Indicate any recent Medical Services you may have received from other than [facility] providers in the past year (date may be approximate).     Assessment:   This is a routine wellness examination for [facility].Physical assessment deferred to PCP.   Hearing/Vision screen No exam data present  Dietary issues and exercise activities discussed:   Diet  Breakfast: Lunch:  Dinner:       Goals   None    Depression Screen PHQ 2/9 Scores 12/06/2016 03/19/2013  PHQ - 2 Score 2 2  PHQ- 9 Score 3 -    Fall Risk Fall Risk  12/06/2016 03/19/2013  Falls in the past year? No Yes  Risk for fall due to : History of fall(s);Impaired mobility -    Is the patient's home free of loose throw rugs in walkways, pet beds, electrical cords, etc?   {Blank single:19197::"yes","no"}      Grab bars in the bathroom? {Blank single:19197::"yes","no"}      Handrails on the stairs?   {Blank single:19197::"yes","no"}      Adequate lighting?   {Blank single:19197::"yes","no"}   Cognitive Function:        Screening Tests Health Maintenance  Topic Date Due  . OPHTHALMOLOGY EXAM  03/10/1939  . DEXA SCAN  03/10/1996  . FOOT EXAM  08/24/2015  . HEMOGLOBIN A1C  03/20/2016  . URINE MICROALBUMIN  04/19/2016  . INFLUENZA VACCINE  02/10/2018 (Originally 09/11/2017)  . TETANUS/TDAP  10/31/2018 (Originally 03/10/1948)  .  PNA vac Low Risk Adult (1 of 2 - PCV13) 10/12/2025 (Originally 03/10/1996)       Plan:   ***  I have personally reviewed and noted the following in the patient's chart:   . Medical and social history . Use of alcohol, tobacco or illicit drugs  . Current medications and supplements . Functional ability and status . Nutritional status . Physical activity . Advanced directives . List of other  physicians . Hospitalizations, surgeries, and ER visits in previous 12 months . Vitals . Screenings to include cognitive, depression, and falls . Referrals and appointments  In addition, I have reviewed and discussed with patient certain preventive protocols, quality metrics, and best practice recommendations. A written personalized care plan for preventive services as well as general preventive health recommendations were provided to patient.     Normand Sloop, LPN   [id number]/63/[id number]

## 2018-01-30 ENCOUNTER — Encounter: Payer: Self-pay | Admitting: Cardiology

## 2018-01-30 ENCOUNTER — Ambulatory Visit: Payer: Medicare Other | Admitting: Cardiology

## 2018-01-30 ENCOUNTER — Other Ambulatory Visit: Payer: Self-pay

## 2018-01-30 ENCOUNTER — Encounter (HOSPITAL_COMMUNITY): Payer: Self-pay

## 2018-01-30 ENCOUNTER — Inpatient Hospital Stay (HOSPITAL_COMMUNITY)
Admission: AD | Admit: 2018-01-30 | Discharge: 2018-02-09 | DRG: 292 | Disposition: A | Discharge status: Skilled Nursing Facility | Payer: Medicare Other | Source: Ambulatory Visit | Attending: Cardiology | Admitting: Cardiology

## 2018-01-30 VITALS — BP 142/78 | HR 59 | Ht 60.0 in | Wt 255.0 lb

## 2018-01-30 DIAGNOSIS — Z950 Presence of cardiac pacemaker: Secondary | ICD-10-CM

## 2018-01-30 DIAGNOSIS — Z7901 Long term (current) use of anticoagulants: Secondary | ICD-10-CM

## 2018-01-30 DIAGNOSIS — Z888 Allergy status to other drugs, medicaments and biological substances status: Secondary | ICD-10-CM

## 2018-01-30 DIAGNOSIS — I10 Essential (primary) hypertension: Secondary | ICD-10-CM

## 2018-01-30 DIAGNOSIS — R402363 Coma scale, best motor response, obeys commands, at hospital admission: Secondary | ICD-10-CM | POA: Diagnosis not present

## 2018-01-30 DIAGNOSIS — Z9119 Patient's noncompliance with other medical treatment and regimen: Secondary | ICD-10-CM

## 2018-01-30 DIAGNOSIS — Z8711 Personal history of peptic ulcer disease: Secondary | ICD-10-CM

## 2018-01-30 DIAGNOSIS — Z8679 Personal history of other diseases of the circulatory system: Secondary | ICD-10-CM

## 2018-01-30 DIAGNOSIS — Z751 Person awaiting admission to adequate facility elsewhere: Secondary | ICD-10-CM | POA: Diagnosis not present

## 2018-01-30 DIAGNOSIS — R600 Localized edema: Secondary | ICD-10-CM

## 2018-01-30 DIAGNOSIS — I110 Hypertensive heart disease with heart failure: Principal | ICD-10-CM | POA: Diagnosis present

## 2018-01-30 DIAGNOSIS — I5033 Acute on chronic diastolic (congestive) heart failure: Secondary | ICD-10-CM | POA: Diagnosis not present

## 2018-01-30 DIAGNOSIS — I2510 Atherosclerotic heart disease of native coronary artery without angina pectoris: Secondary | ICD-10-CM | POA: Diagnosis present

## 2018-01-30 DIAGNOSIS — I872 Venous insufficiency (chronic) (peripheral): Secondary | ICD-10-CM

## 2018-01-30 DIAGNOSIS — I4892 Unspecified atrial flutter: Secondary | ICD-10-CM | POA: Diagnosis present

## 2018-01-30 DIAGNOSIS — Z7401 Bed confinement status: Secondary | ICD-10-CM | POA: Diagnosis not present

## 2018-01-30 DIAGNOSIS — R079 Chest pain, unspecified: Secondary | ICD-10-CM | POA: Diagnosis not present

## 2018-01-30 DIAGNOSIS — E785 Hyperlipidemia, unspecified: Secondary | ICD-10-CM | POA: Diagnosis not present

## 2018-01-30 DIAGNOSIS — I5031 Acute diastolic (congestive) heart failure: Secondary | ICD-10-CM | POA: Diagnosis not present

## 2018-01-30 DIAGNOSIS — F410 Panic disorder [episodic paroxysmal anxiety] without agoraphobia: Secondary | ICD-10-CM | POA: Diagnosis not present

## 2018-01-30 DIAGNOSIS — M810 Age-related osteoporosis without current pathological fracture: Secondary | ICD-10-CM | POA: Diagnosis not present

## 2018-01-30 DIAGNOSIS — X58XXXA Exposure to other specified factors, initial encounter: Secondary | ICD-10-CM | POA: Diagnosis present

## 2018-01-30 DIAGNOSIS — R402143 Coma scale, eyes open, spontaneous, at hospital admission: Secondary | ICD-10-CM | POA: Diagnosis not present

## 2018-01-30 DIAGNOSIS — Z903 Acquired absence of stomach [part of]: Secondary | ICD-10-CM

## 2018-01-30 DIAGNOSIS — I5020 Unspecified systolic (congestive) heart failure: Secondary | ICD-10-CM | POA: Diagnosis not present

## 2018-01-30 DIAGNOSIS — L03818 Cellulitis of other sites: Secondary | ICD-10-CM | POA: Diagnosis not present

## 2018-01-30 DIAGNOSIS — R5381 Other malaise: Secondary | ICD-10-CM

## 2018-01-30 DIAGNOSIS — M069 Rheumatoid arthritis, unspecified: Secondary | ICD-10-CM | POA: Diagnosis present

## 2018-01-30 DIAGNOSIS — I4819 Other persistent atrial fibrillation: Secondary | ICD-10-CM

## 2018-01-30 DIAGNOSIS — Z792 Long term (current) use of antibiotics: Secondary | ICD-10-CM

## 2018-01-30 DIAGNOSIS — I878 Other specified disorders of veins: Secondary | ICD-10-CM | POA: Diagnosis present

## 2018-01-30 DIAGNOSIS — Z881 Allergy status to other antibiotic agents status: Secondary | ICD-10-CM

## 2018-01-30 DIAGNOSIS — R609 Edema, unspecified: Secondary | ICD-10-CM | POA: Diagnosis not present

## 2018-01-30 DIAGNOSIS — E119 Type 2 diabetes mellitus without complications: Secondary | ICD-10-CM | POA: Diagnosis present

## 2018-01-30 DIAGNOSIS — J45909 Unspecified asthma, uncomplicated: Secondary | ICD-10-CM | POA: Diagnosis present

## 2018-01-30 DIAGNOSIS — Z79899 Other long term (current) drug therapy: Secondary | ICD-10-CM

## 2018-01-30 DIAGNOSIS — I4821 Permanent atrial fibrillation: Secondary | ICD-10-CM | POA: Diagnosis present

## 2018-01-30 DIAGNOSIS — L0390 Cellulitis, unspecified: Secondary | ICD-10-CM | POA: Diagnosis not present

## 2018-01-30 DIAGNOSIS — L03115 Cellulitis of right lower limb: Secondary | ICD-10-CM | POA: Diagnosis not present

## 2018-01-30 DIAGNOSIS — Z9114 Patient's other noncompliance with medication regimen: Secondary | ICD-10-CM

## 2018-01-30 DIAGNOSIS — Z884 Allergy status to anesthetic agent status: Secondary | ICD-10-CM

## 2018-01-30 DIAGNOSIS — R402253 Coma scale, best verbal response, oriented, at hospital admission: Secondary | ICD-10-CM | POA: Diagnosis not present

## 2018-01-30 DIAGNOSIS — M2550 Pain in unspecified joint: Secondary | ICD-10-CM | POA: Diagnosis not present

## 2018-01-30 DIAGNOSIS — F05 Delirium due to known physiological condition: Secondary | ICD-10-CM | POA: Diagnosis not present

## 2018-01-30 DIAGNOSIS — I5032 Chronic diastolic (congestive) heart failure: Secondary | ICD-10-CM | POA: Diagnosis not present

## 2018-01-30 DIAGNOSIS — Z886 Allergy status to analgesic agent status: Secondary | ICD-10-CM

## 2018-01-30 DIAGNOSIS — L03116 Cellulitis of left lower limb: Secondary | ICD-10-CM | POA: Diagnosis not present

## 2018-01-30 DIAGNOSIS — R531 Weakness: Secondary | ICD-10-CM | POA: Diagnosis not present

## 2018-01-30 DIAGNOSIS — Z885 Allergy status to narcotic agent status: Secondary | ICD-10-CM

## 2018-01-30 DIAGNOSIS — I779 Disorder of arteries and arterioles, unspecified: Secondary | ICD-10-CM | POA: Diagnosis not present

## 2018-01-30 DIAGNOSIS — I4811 Longstanding persistent atrial fibrillation: Secondary | ICD-10-CM | POA: Diagnosis not present

## 2018-01-30 DIAGNOSIS — S81809A Unspecified open wound, unspecified lower leg, initial encounter: Secondary | ICD-10-CM | POA: Diagnosis not present

## 2018-01-30 DIAGNOSIS — I370 Nonrheumatic pulmonary valve stenosis: Secondary | ICD-10-CM | POA: Diagnosis not present

## 2018-01-30 DIAGNOSIS — I480 Paroxysmal atrial fibrillation: Secondary | ICD-10-CM

## 2018-01-30 DIAGNOSIS — L03119 Cellulitis of unspecified part of limb: Secondary | ICD-10-CM | POA: Diagnosis not present

## 2018-01-30 DIAGNOSIS — I482 Chronic atrial fibrillation, unspecified: Secondary | ICD-10-CM | POA: Diagnosis not present

## 2018-01-30 DIAGNOSIS — Z9109 Other allergy status, other than to drugs and biological substances: Secondary | ICD-10-CM

## 2018-01-30 DIAGNOSIS — Z880 Allergy status to penicillin: Secondary | ICD-10-CM

## 2018-01-30 LAB — BASIC METABOLIC PANEL
Anion gap: 15 (ref 5–15)
BUN: 12 mg/dL (ref 8–23)
CALCIUM: 9.2 mg/dL (ref 8.9–10.3)
CO2: 25 mmol/L (ref 22–32)
Chloride: 102 mmol/L (ref 98–111)
Creatinine, Ser: 1.01 mg/dL — ABNORMAL HIGH (ref 0.44–1.00)
GFR calc Af Amer: 58 mL/min — ABNORMAL LOW (ref >60)
GFR calc non Af Amer: 50 mL/min — ABNORMAL LOW (ref >60)
Glucose, Bld: 119 mg/dL — ABNORMAL HIGH (ref 70–99)
Potassium: 3 mmol/L — ABNORMAL LOW (ref 3.5–5.1)
Sodium: 142 mmol/L (ref 135–145)

## 2018-01-30 LAB — CBC WITH DIFFERENTIAL/PLATELET
Abs Immature Granulocytes: 0.03 10*3/uL (ref 0.00–0.07)
Basophils Absolute: 0 10*3/uL (ref 0.0–0.1)
Basophils Relative: 0 %
EOS PCT: 4 %
Eosinophils Absolute: 0.3 10*3/uL (ref 0.0–0.5)
HCT: 46.6 % — ABNORMAL HIGH (ref 36.0–46.0)
HEMOGLOBIN: 14.4 g/dL (ref 12.0–15.0)
Immature Granulocytes: 0 %
Lymphocytes Relative: 17 %
Lymphs Abs: 1.3 10*3/uL (ref 0.7–4.0)
MCH: 29 pg (ref 26.0–34.0)
MCHC: 30.9 g/dL (ref 30.0–36.0)
MCV: 94 fL (ref 80.0–100.0)
Monocytes Absolute: 1 10*3/uL (ref 0.1–1.0)
Monocytes Relative: 13 %
Neutro Abs: 4.8 10*3/uL (ref 1.7–7.7)
Neutrophils Relative %: 66 %
Platelets: 196 10*3/uL (ref 150–400)
RBC: 4.96 MIL/uL (ref 3.87–5.11)
RDW: 16.4 % — AB (ref 11.5–15.5)
WBC: 7.3 10*3/uL (ref 4.0–10.5)
nRBC: 0 % (ref 0.0–0.2)

## 2018-01-30 LAB — BRAIN NATRIURETIC PEPTIDE: B Natriuretic Peptide: 193.5 pg/mL — ABNORMAL HIGH (ref 0.0–100.0)

## 2018-01-30 LAB — GLUCOSE, CAPILLARY
Glucose-Capillary: 101 mg/dL — ABNORMAL HIGH (ref 70–99)
Glucose-Capillary: 156 mg/dL — ABNORMAL HIGH (ref 70–99)
Glucose-Capillary: 134 mg/dL — ABNORMAL HIGH (ref 70–99)

## 2018-01-30 MED ORDER — SODIUM CHLORIDE 0.9 % IV SOLN: 250.0000 mL | INTRAVENOUS | Status: DC | PRN

## 2018-01-30 MED ORDER — ONDANSETRON HCL 4 MG/2ML IJ SOLN
4.0000 mg | Freq: Four times a day (QID) | INTRAMUSCULAR | Status: DC | PRN
  Administered 2018-02-03 – 2018-02-05 (×2): 4 mg via INTRAVENOUS
  Filled 2018-01-30 (×2): qty 2

## 2018-01-30 MED ORDER — POTASSIUM CHLORIDE CRYS ER 20 MEQ PO TBCR
40.0000 meq | EXTENDED_RELEASE_TABLET | Freq: Two times a day (BID) | ORAL | Status: AC
  Administered 2018-01-31 – 2018-02-03 (×8): 40 meq via ORAL
  Filled 2018-01-30 (×8): qty 2

## 2018-01-30 MED ORDER — SODIUM CHLORIDE 0.9% FLUSH: 3.0000 mL | INTRAVENOUS | Status: DC | PRN

## 2018-01-30 MED ORDER — APIXABAN 5 MG PO TABS
5.0000 mg | ORAL_TABLET | Freq: Two times a day (BID) | ORAL | Status: DC
  Administered 2018-01-30 – 2018-02-09 (×21): 5 mg via ORAL
  Filled 2018-01-30 (×21): qty 1

## 2018-01-30 MED ORDER — ZOLPIDEM TARTRATE 5 MG PO TABS
5.0000 mg | ORAL_TABLET | Freq: Every evening | ORAL | Status: DC | PRN
  Administered 2018-01-31 – 2018-02-03 (×4): 5 mg via ORAL
  Filled 2018-01-30 (×4): qty 1

## 2018-01-30 MED ORDER — ATORVASTATIN CALCIUM 40 MG PO TABS
40.0000 mg | ORAL_TABLET | Freq: Every day | ORAL | Status: DC
  Administered 2018-01-30 – 2018-02-08 (×10): 40 mg via ORAL
  Filled 2018-01-30 (×10): qty 1

## 2018-01-30 MED ORDER — CLINDAMYCIN HCL 150 MG PO CAPS
150.0000 mg | ORAL_CAPSULE | Freq: Three times a day (TID) | ORAL | Status: AC
  Administered 2018-01-30 – 2018-02-07 (×26): 150 mg via ORAL
  Filled 2018-01-30 (×26): qty 1

## 2018-01-30 MED ORDER — ALPRAZOLAM 0.25 MG PO TABS: 0.2500 mg | ORAL_TABLET | Freq: Two times a day (BID) | ORAL | Status: DC | PRN

## 2018-01-30 MED ORDER — TRAMADOL-ACETAMINOPHEN 37.5-325 MG PO TABS
1.0000 | ORAL_TABLET | Freq: Four times a day (QID) | ORAL | Status: DC | PRN
  Administered 2018-01-30 – 2018-02-06 (×7): 1 via ORAL
  Filled 2018-01-30 (×8): qty 1

## 2018-01-30 MED ORDER — NITROGLYCERIN 0.4 MG SL SUBL
0.4000 mg | SUBLINGUAL_TABLET | SUBLINGUAL | Status: DC | PRN
  Administered 2018-02-03: 0.4 mg via SUBLINGUAL
  Filled 2018-01-30: qty 1

## 2018-01-30 MED ORDER — ACETAMINOPHEN 325 MG PO TABS
650.0000 mg | ORAL_TABLET | ORAL | Status: DC | PRN
  Administered 2018-01-30 – 2018-02-04 (×2): 650 mg via ORAL
  Filled 2018-01-30 (×2): qty 2

## 2018-01-30 MED ORDER — SODIUM CHLORIDE 0.9% FLUSH
3.0000 mL | Freq: Two times a day (BID) | INTRAVENOUS | Status: DC
  Administered 2018-01-30 – 2018-02-09 (×21): 3 mL via INTRAVENOUS

## 2018-01-30 MED ORDER — FUROSEMIDE 10 MG/ML IJ SOLN
80.0000 mg | Freq: Two times a day (BID) | INTRAMUSCULAR | Status: DC
  Administered 2018-01-30 – 2018-02-03 (×6): 80 mg via INTRAVENOUS
  Filled 2018-01-30 (×8): qty 8

## 2018-01-30 MED ORDER — POTASSIUM CHLORIDE CRYS ER 20 MEQ PO TBCR
40.0000 meq | EXTENDED_RELEASE_TABLET | ORAL | Status: AC
  Administered 2018-01-30 (×2): 40 meq via ORAL
  Filled 2018-01-30 (×2): qty 2

## 2018-01-30 NOTE — H&P (Signed)
Katherine Bunting, MD  Physician  Cardiology  Progress Notes    Signed  Encounter Date:  01/30/2018       Related encounter: Office Visit from 01/30/2018 in [facility]      Signed         Show:Clear all [x] Manual[x] Template[x] Copied  Added by: [x] , MD  [] Hover for details      HPI: FU atrial fibrillation/flutter and diastolic CHF. Patient has had previous care in [place]. Previous cath but no records available. Previous atrial flutter ablation and pacemaker. Nuclear study September 2011 showed ejection fraction 76% and no ischemia or infarction. Carotid dopplers 3/17 showed 1-39 bilateral stenosis.Echo 8/18 showed EF 50-55, mild LAE.Since last seen, patient notes dyspnea on exertion, orthopnea but no PND.  She has bilateral lower extremity pedal edema with erythema.  Occasional chest pain not exertional.  No palpitations, syncope or bleeding.        Current Outpatient Medications  Medication Sig Dispense Refill  . apixaban (ELIQUIS) 5 MG TABS tablet Take 1 tablet (5 mg total) by mouth 2 (two) times daily. 60 tablet 6  . clindamycin (CLEOCIN) 150 MG capsule Take 1 capsule (150 mg total) by mouth 3 (three) times daily. 30 capsule 0  . furosemide (LASIX) 40 MG tablet Take 2 tablets (80 mg total) by mouth 2 (two) times daily. (Patient taking differently: Take 80 mg by mouth daily. ) 180 tablet 2  . nitroGLYCERIN (NITROSTAT) 0.4 MG SL tablet Place 1 tablet (0.4 mg total) under the tongue every 5 (five) minutes as needed for chest pain (MAX 3 TABLETS). 25 tablet 3  . traMADol-acetaminophen (ULTRACET) 37.5-325 MG tablet TAKE ONE TABLET BY MOUTH EVERY 8 HOURS AS NEEDED FOR ARTHRITIS PAIN 45 tablet 0   No current facility-administered medications for this visit.          Past Medical History:  Diagnosis Date  . Asthma   . Atrial fibrillation (HCC) 09/27/2011  . CAD (coronary artery disease) 09/27/2011  . Diabetes mellitus without  complication (HCC)   . Dyslipidemia 09/27/2011  . History of vasculitis 09/27/2011  . Hypertension 09/27/2011  . Osteoporosis 09/27/2011  . Pacemaker   . PUD (peptic ulcer disease)   . Rheumatoid arthritis(714.0) 09/27/2011         Past Surgical History:  Procedure Laterality Date  . ABDOMINAL HYSTERECTOMY    . ANKLE SURGERY    . APPENDECTOMY    . Carpal tunnel    . CHOLECYSTECTOMY    . PARTIAL GASTRECTOMY      Social History        Socioeconomic History  . Marital status: Widowed    Spouse name: Not on file  . Number of children: 7  . Years of education: Not on file  . Highest education level: Not on file  Occupational History  . Not on file  Social Needs  . Financial resource strain: Not on file  . Food insecurity:    Worry: Not on file    Inability: Not on file  . Transportation needs:    Medical: Not on file    Non-medical: Not on file  Tobacco Use  . Smoking status: Never Smoker  . Smokeless tobacco: Never Used  Substance and Sexual Activity  . Alcohol use: Yes    Alcohol/week: 0.0 standard drinks    Comment: Occasional  . Drug use: No  . Sexual activity: Not on file  Lifestyle  . Physical activity:    Days per week: Not  on file    Minutes per session: Not on file  . Stress: Not on file  Relationships  . Social connections:    Talks on phone: Not on file    Gets together: Not on file    Attends religious service: Not on file    Active member of club or organization: Not on file    Attends meetings of clubs or organizations: Not on file    Relationship status: Not on file  . Intimate partner violence:    Fear of current or ex partner: Not on file    Emotionally abused: Not on file    Physically abused: Not on file    Forced sexual activity: Not on file  Other Topics Concern  . Not on file  Social History Narrative  . Not on file         Family History  Problem Relation Age of Onset    . Hypertension Mother   . Cancer Brother   . Cancer Brother     ROS: Pain in knees but no fevers or chills, productive cough, hemoptysis, dysphasia, odynophagia, melena, hematochezia, dysuria, hematuria, rash, seizure activity, orthopnea, PND, claudication. Remaining systems are negative.  Physical Exam: Well-developed morbidly obese in no acute distress.  Skin is warm and dry.  HEENT is normal.  Neck is supple. No bruits Chest is clear to auscultation with normal expansion.  Cardiovascular exam is regular rate and rhythm.  Abdominal exam nontender or distended. No masses palpated. obese Extremities show 3+edema; there is erythema and weeping. neuro grossly intact  ECG-atrial fibrillation at a rate of 59, left anterior fascicular block, right bundle branch block.  Personally reviewed  A/P  1 acute on chronic diastolic congestive heart failure-patient remains markedly volume overloaded on examination.  She is now willing to be admitted for IV diuresis.  We will admit and treat with Lasix 80 mg IV twice daily.  Fluid restrict to 1 L daily.  Follow low-sodium diet.  Follow renal function daily.  Repeat echocardiogram.  Note she has not been taking her medications and this is been a problem in the past.  2 persistent atrial fibrillation-patient's heart rate is controlled on no medications.  She has had problems with compliance in the past.  She states she has not taken her Eliquis every day.  We will resume 5 mg twice daily.  Would not consider cardioversion unless she can be persistent with apixaban.  3 Coronary artery disease-plan to continue medical therapy; resume Lipitor 40 mg daily.  No aspirin given need for anticoagulation.  4 prior pacemaker-followed by electrophysiology.  5 hypertension-patient's blood pressure is mildly elevated.  We will follow and increase medications as needed.  6 carotid artery disease-mild on most recent Dopplers.  Continue statin.  7  history of noncompliance- I discussed the importance of compliance.  8 lower extremity edema-diurese as outlined above.  We will ask the wound care team to follow.  We will continue antibiotics for possible component of cellulitis.  Olga Millers, MD

## 2018-01-31 ENCOUNTER — Inpatient Hospital Stay (HOSPITAL_COMMUNITY): Payer: Medicare Other

## 2018-01-31 DIAGNOSIS — I370 Nonrheumatic pulmonary valve stenosis: Secondary | ICD-10-CM

## 2018-01-31 DIAGNOSIS — I5020 Unspecified systolic (congestive) heart failure: Secondary | ICD-10-CM

## 2018-01-31 DIAGNOSIS — I482 Chronic atrial fibrillation, unspecified: Secondary | ICD-10-CM

## 2018-01-31 LAB — ECHOCARDIOGRAM COMPLETE
HEIGHTINCHES: 60 in
Weight: 4275.2 oz

## 2018-01-31 LAB — BASIC METABOLIC PANEL
Anion gap: 12 (ref 5–15)
BUN: 14 mg/dL (ref 8–23)
CALCIUM: 8.4 mg/dL — AB (ref 8.9–10.3)
CO2: 26 mmol/L (ref 22–32)
CREATININE: 1.09 mg/dL — AB (ref 0.44–1.00)
Chloride: 104 mmol/L (ref 98–111)
GFR calc Af Amer: 53 mL/min — ABNORMAL LOW (ref >60)
GFR calc non Af Amer: 46 mL/min — ABNORMAL LOW (ref >60)
Glucose, Bld: 122 mg/dL — ABNORMAL HIGH (ref 70–99)
Potassium: 4.3 mmol/L (ref 3.5–5.1)
Sodium: 142 mmol/L (ref 135–145)

## 2018-01-31 LAB — GLUCOSE, CAPILLARY
Glucose-Capillary: 130 mg/dL — ABNORMAL HIGH (ref 70–99)
Glucose-Capillary: 145 mg/dL — ABNORMAL HIGH (ref 70–99)
Glucose-Capillary: 115 mg/dL — ABNORMAL HIGH (ref 70–99)

## 2018-01-31 MED ORDER — PERFLUTREN LIPID MICROSPHERE
1.0000 mL | INTRAVENOUS | Status: AC | PRN
  Administered 2018-01-31: 1 mL via INTRAVENOUS
  Administered 2018-01-31: 2 mL via INTRAVENOUS
  Filled 2018-01-31: qty 10

## 2018-01-31 NOTE — Plan of Care (Signed)
Monitor I and O. Admin prescribed diuretics. Wound care for Lower Ext vasculitis.

## 2018-01-31 NOTE — Progress Notes (Signed)
*  PRELIMINARY RESULTS* Echocardiogram 2D Echocardiogram has been performed with Definity.  Stacey Drain 01/31/2018, 2:33 PM

## 2018-01-31 NOTE — Progress Notes (Signed)
Progress Note  Patient Name: Katherine Cervantes Date of Encounter: 01/31/2018  Primary Cardiologist: [name]  Subjective   Legs hurt no dyspnea   Inpatient Medications    Scheduled Meds: . apixaban  5 mg Oral BID  . atorvastatin  40 mg Oral q1800  . clindamycin  150 mg Oral TID  . furosemide  80 mg Intravenous BID  . potassium chloride  40 mEq Oral BID  . sodium chloride flush  3 mL Intravenous Q12H   Continuous Infusions: . sodium chloride     PRN Meds: sodium chloride, acetaminophen, ALPRAZolam, nitroGLYCERIN, ondansetron (ZOFRAN) IV, sodium chloride flush, traMADol-acetaminophen, zolpidem   Vital Signs    Vitals:   01/30/18 1943 01/30/18 2031 01/31/18 0400 01/31/18 0752  BP:  (!) 165/80 (!) 154/69   Pulse: 67 75 62 (!) 56  Temp:  98 F (36.7 C) 97.9 F (36.6 C)   TempSrc:  Oral Oral   SpO2: 98% 95% 94% 94%  Weight:   121.2 kg   Height:        Intake/Output Summary (Last 24 hours) at 01/31/2018 1054 Last data filed at 01/31/2018 0900 Gross per 24 hour  Intake 720 ml  Output 600 ml  Net 120 ml   Filed Weights   01/30/18 1318 01/31/18 0400  Weight: 121.2 kg 121.2 kg    Telemetry    afib rates 60-80 - Personally Reviewed  ECG    afib RBBB LAD  - Personally Reviewed  Physical Exam  Morbidly obese female  GEN: No acute distress.   Neck: No JVD Cardiac: RRR, no murmurs, rubs, or gallops. Pacer under left clavicle  Respiratory: Clear to auscultation bilaterally. GI: Soft, nontender, non-distended  Plus 4 bilateral LE edema with skin breakdown right TKR Neuro:  Nonfocal  Psych: Normal affect   Labs    Chemistry Recent Labs  Lab 01/30/18 1530 01/31/18 0312  NA 142 142  K 3.0* 4.3  CL 102 104  CO2 25 26  GLUCOSE 119* 122*  BUN 12 14  CREATININE 1.01* 1.09*  CALCIUM 9.2 8.4*  GFRNONAA 50* 46*  GFRAA 58* 53*  ANIONGAP 15 12     Hematology Recent Labs  Lab 01/30/18 1530  WBC 7.3  RBC 4.96  HGB 14.4  HCT 46.6*  MCV 94.0    MCH 29.0  MCHC 30.9  RDW 16.4*  PLT 196    Cardiac EnzymesNo results for input(s): TROPONINI in the last 168 hours. No results for input(s): TROPIPOC in the last 168 hours.   BNP Recent Labs  Lab 01/30/18 1530  BNP 193.5*     DDimer No results for input(s): DDIMER in the last 168 hours.   Radiology    No results found.  Cardiac Studies   TTE pending August 2019 EF 50-55%   Patient Profile     84 y.o. female with chronic afib, PPM, CHF medical non compliance admitted form office By Dr [name] for iv diuresis   Assessment & Plan    Edema:  Marked wound care consult in orders. Legs wrapped On anticoagulaiton for afib So don't need to worry about DVT. Continue iv lasix diuresis   CHF:  F/u TTE pending EF 50-55% by TTE 10/04/16 BNP only 193 suggesting that she has  LE venous disease accounting for most of her leg issues   AFib:  Chronic rate control fine has been non compliant with anticoagulation in past currently on eliquis  PPM:  Normal function f/u EP  For questions  or updates, please contact [facility] Please consult [email or web address] for contact info under        Signed, Charlton Haws, MD  01/31/2018, 10:54 AM

## 2018-02-01 LAB — BASIC METABOLIC PANEL
Anion gap: 11 (ref 5–15)
BUN: 14 mg/dL (ref 8–23)
CO2: 27 mmol/L (ref 22–32)
Calcium: 8.7 mg/dL — ABNORMAL LOW (ref 8.9–10.3)
Chloride: 102 mmol/L (ref 98–111)
Creatinine, Ser: 1.15 mg/dL — ABNORMAL HIGH (ref 0.44–1.00)
GFR calc Af Amer: 50 mL/min — ABNORMAL LOW (ref >60)
GFR calc non Af Amer: 43 mL/min — ABNORMAL LOW (ref >60)
GLUCOSE: 122 mg/dL — AB (ref 70–99)
Potassium: 4.2 mmol/L (ref 3.5–5.1)
Sodium: 140 mmol/L (ref 135–145)

## 2018-02-01 MED ORDER — ALPRAZOLAM 0.25 MG PO TABS
0.2500 mg | ORAL_TABLET | Freq: Three times a day (TID) | ORAL | Status: DC | PRN
  Administered 2018-02-06 – 2018-02-09 (×5): 0.25 mg via ORAL
  Filled 2018-02-01 (×6): qty 1

## 2018-02-01 NOTE — Progress Notes (Signed)
Progress Note  Patient Name: Katherine Cervantes Date of Encounter: 02/01/2018  Primary Cardiologist: [name]  Subjective   Panic attack last night needs to be able to get OOB to chair   Inpatient Medications    Scheduled Meds: . apixaban  5 mg Oral BID  . atorvastatin  40 mg Oral q1800  . clindamycin  150 mg Oral TID  . furosemide  80 mg Intravenous BID  . potassium chloride  40 mEq Oral BID  . sodium chloride flush  3 mL Intravenous Q12H   Continuous Infusions: . sodium chloride     PRN Meds: sodium chloride, acetaminophen, ALPRAZolam, nitroGLYCERIN, ondansetron (ZOFRAN) IV, sodium chloride flush, traMADol-acetaminophen, zolpidem   Vital Signs    Vitals:   01/31/18 1430 01/31/18 1600 01/31/18 1958 02/01/18 0527  BP: (!) 126/50  (!) 148/69 (!) 157/68  Pulse: 67 (!) 53 66 (!) 56  Resp: 18     Temp:   98.1 F (36.7 C) 98 F (36.7 C)  TempSrc:   Oral Oral  SpO2: 94% 95% 91% 97%  Weight:    122 kg  Height:        Intake/Output Summary (Last 24 hours) at 02/01/2018 1157 Last data filed at 02/01/2018 0800 Gross per 24 hour  Intake 440 ml  Output 1800 ml  Net -1360 ml   Filed Weights   01/30/18 1318 01/31/18 0400 02/01/18 0527  Weight: 121.2 kg 121.2 kg 122 kg    Telemetry    afib rates 60-80 - Personally Reviewed  ECG    afib RBBB LAD  - Personally Reviewed  Physical Exam  Morbidly obese female  GEN: No acute distress.   Neck: No JVD Cardiac: RRR, no murmurs, rubs, or gallops. Pacer under left clavicle  Respiratory: Clear to auscultation bilaterally. GI: Soft, nontender, non-distended  Plus 4 bilateral LE edema with skin breakdown right TKR Neuro:  Nonfocal  Psych: Normal affect   Labs    Chemistry Recent Labs  Lab 01/30/18 1530 01/31/18 0312 02/01/18 0504  NA 142 142 140  K 3.0* 4.3 4.2  CL 102 104 102  CO2 25 26 27   GLUCOSE 119* 122* 122*  BUN 12 14 14   CREATININE 1.01* 1.09* 1.15*  CALCIUM 9.2 8.4* 8.7*  GFRNONAA 50* 46* 43*    GFRAA 58* 53* 50*  ANIONGAP 15 12 11      Hematology Recent Labs  Lab 01/30/18 1530  WBC 7.3  RBC 4.96  HGB 14.4  HCT 46.6*  MCV 94.0  MCH 29.0  MCHC 30.9  RDW 16.4*  PLT 196    Cardiac EnzymesNo results for input(s): TROPONINI in the last 168 hours. No results for input(s): TROPIPOC in the last 168 hours.   BNP Recent Labs  Lab 01/30/18 1530  BNP 193.5*     DDimer No results for input(s): DDIMER in the last 168 hours.   Radiology    No results found.  Cardiac Studies   TTE pending August 2019 EF 50-55%   Patient Profile     84 y.o. female with chronic afib, PPM, CHF medical non compliance admitted form office By Dr 02/01/18 for iv diuresis   Assessment & Plan    Edema:  Marked wound care consult in orders. Legs wrapped On anticoagulaiton for afib So don't need to worry about DVT. Continue iv lasix diuresis 1.3 Liters out yesterday  CHF:  TTE 01/31/18 EF 60-65%  BNP only 193 suggesting that she has  LE venous disease accounting for  most of her leg issues   AFib:  Chronic rate control fine has been non compliant with anticoagulation in past currently on eliquis  PPM:  Normal function f/u EP  Panic Attack:  Will write order for OOB to chair with assistance and PRN xanax   For questions or updates, please contact [facility] Please consult [email or web address] for contact info under        Signed, Charlton Haws, MD  02/01/2018, 9:27 AM

## 2018-02-01 NOTE — Discharge Instructions (Addendum)
Heart Failure daily educations 1. Weigh yourself EVERY morning after you go to the bathroom but before you eat or drink anything. Write this number down in a weight log/diary. If you gain 3 pounds overnight or 5 pounds in a week, call the office. 2. Take your medicines as prescribed. If you have concerns about your medications, please call us before you stop taking them.  3. Eat low salt foods--Limit salt (sodium) to 2000 mg per day. This will help prevent your body from holding onto fluid. Read food labels as many processed foods have a lot of sodium, especially canned goods and prepackaged meats. If you would like some assistance choosing low sodium foods, we would be happy to set you up with a nutritionist. 4. Stay as active as you can everyday. Staying active will give you more energy and make your muscles stronger. Start with 5 minutes at a time and work your way up to 30 minutes a day. Break up your activities--do some in the morning and some in the afternoon. Start with 3 days per week and work your way up to 5 days as you can.  If you have chest pain, feel short of breath, dizzy, or lightheaded, STOP. If you don't feel better after a short rest, call 911. If you do feel better, call the office to let us know you have symptoms with exercise. 5. Limit all fluids for the day to less than 2 liters. Fluid includes all drinks, coffee, juice, ice chips, soup, jello, and all other liquids.       Information on my medicine - ELIQUIS (apixaban)  This medication education was reviewed with me or my healthcare representative as part of my discharge preparation.  The pharmacist that spoke with me during my hospital stay was:  Mosetta Anis, [facility]  Why was Eliquis prescribed for you? Eliquis was prescribed for you to reduce the risk of a blood clot forming that can cause a stroke if you have a medical condition called atrial fibrillation (a type of irregular heartbeat).  What do You need to know  about Eliquis ? Take your Eliquis TWICE DAILY - one tablet in the morning and one tablet in the evening with or without food. If you have difficulty swallowing the tablet whole please discuss with your pharmacist how to take the medication safely.  Take Eliquis exactly as prescribed by your doctor and DO NOT stop taking Eliquis without talking to the doctor who prescribed the medication.  Stopping may increase your risk of developing a stroke.  Refill your prescription before you run out.  After discharge, you should have regular check-up appointments with your healthcare provider that is prescribing your Eliquis.  In the future your dose may need to be changed if your kidney function or weight changes by a significant amount or as you get older.  What do you do if you miss a dose? If you miss a dose, take it as soon as you remember on the same day and resume taking twice daily.  Do not take more than one dose of ELIQUIS at the same time to make up a missed dose.  Important Safety Information A possible side effect of Eliquis is bleeding. You should call your healthcare provider right away if you experience any of the following: ? Bleeding from an injury or your nose that does not stop. ? Unusual colored urine (red or dark brown) or unusual colored stools (red or black). ? Unusual bruising for unknown reasons. ?  A serious fall or if you hit your head (even if there is no bleeding).  Some medicines may interact with Eliquis and might increase your risk of bleeding or clotting while on Eliquis. To help avoid this, consult your healthcare provider or pharmacist prior to using any new prescription or non-prescription medications, including herbals, vitamins, non-steroidal anti-inflammatory drugs (NSAIDs) and supplements.  This website has more information on Eliquis (apixaban): http://www.eliquis.com/eliquis/home

## 2018-02-02 ENCOUNTER — Ambulatory Visit: Payer: Medicare Other

## 2018-02-02 ENCOUNTER — Encounter (HOSPITAL_COMMUNITY): Payer: Self-pay

## 2018-02-02 DIAGNOSIS — I4811 Longstanding persistent atrial fibrillation: Secondary | ICD-10-CM

## 2018-02-02 DIAGNOSIS — L03119 Cellulitis of unspecified part of limb: Secondary | ICD-10-CM

## 2018-02-02 DIAGNOSIS — R600 Localized edema: Secondary | ICD-10-CM

## 2018-02-02 DIAGNOSIS — I5032 Chronic diastolic (congestive) heart failure: Secondary | ICD-10-CM

## 2018-02-02 LAB — BASIC METABOLIC PANEL
Anion gap: 12 (ref 5–15)
BUN: 12 mg/dL (ref 8–23)
CO2: 30 mmol/L (ref 22–32)
Calcium: 8.8 mg/dL — ABNORMAL LOW (ref 8.9–10.3)
Chloride: 98 mmol/L (ref 98–111)
Creatinine, Ser: 1.09 mg/dL — ABNORMAL HIGH (ref 0.44–1.00)
GFR calc non Af Amer: 46 mL/min — ABNORMAL LOW (ref >60)
GFR, EST AFRICAN AMERICAN: 53 mL/min — AB (ref >60)
Glucose, Bld: 143 mg/dL — ABNORMAL HIGH (ref 70–99)
Potassium: 4.1 mmol/L (ref 3.5–5.1)
Sodium: 140 mmol/L (ref 135–145)

## 2018-02-02 NOTE — Consult Note (Addendum)
WOC Nurse wound consult note Reason for Consult: Consult requested for cellulitis to bilat legs; she has systemic coverage with antibiotics and states legs have greatly improved since admission. Pt states she developed blisters and swelling which ruptured and evolved into full thickness wounds last week. Wound type: Patchy areas of full thickness wounds to anterior legs; 3X3X.2cm to right anterior calf and 6X6X.2cm to left anterior calf, red and moist, small amt yellow drainage, no odor or fluctuance. Periwound: Generalized edema to bilat legs and feet, tender to touch. Dressing procedure/placement/frequency: Foam dressing to open areas to absorb drainage and promote healing.  Ace wrap to provide light compression.  Discussed plan of care with patient and daughter at the bedside. Please re-consult if further assistance is needed.  Thank-you,  Cammie Mcgee MSN, RN, CWOCN, [place], CNS [phone]

## 2018-02-02 NOTE — Addendum Note (Signed)
Addended by: Alyson Ingles on: 02/02/2018 02:16 PM   Modules accepted: Orders

## 2018-02-02 NOTE — Progress Notes (Addendum)
Progress Note  Patient Name: Katherine Cervantes Date of Encounter: 02/02/2018  Primary Cardiologist: Katherine Millers, MD   Subjective   Breathing improving.   Inpatient Medications    Scheduled Meds: . apixaban  5 mg Oral BID  . atorvastatin  40 mg Oral q1800  . clindamycin  150 mg Oral TID  . furosemide  80 mg Intravenous BID  . potassium chloride  40 mEq Oral BID  . sodium chloride flush  3 mL Intravenous Q12H   Continuous Infusions: . sodium chloride     PRN Meds: sodium chloride, acetaminophen, ALPRAZolam, nitroGLYCERIN, ondansetron (ZOFRAN) IV, sodium chloride flush, traMADol-acetaminophen, zolpidem   Vital Signs    Vitals:   02/01/18 1331 02/01/18 2111 02/02/18 0446 02/02/18 0449  BP: (!) 147/64 135/60 (!) 180/65   Pulse: 60 65 67   Resp: (!) 25  19   Temp: 97.8 F (36.6 C) 98.3 F (36.8 C) 98.4 F (36.9 C)   TempSrc: Oral Oral Oral   SpO2: 94% 97% 95%   Weight:    117.8 kg  Height:        Intake/Output Summary (Last 24 hours) at 02/02/2018 0844 Last data filed at 02/02/2018 0449 Gross per 24 hour  Intake 483 ml  Output 4450 ml  Net -3967 ml   Filed Weights   01/31/18 0400 02/01/18 0527 02/02/18 0449  Weight: 121.2 kg 122 kg 117.8 kg    Telemetry    afib at 70s - Personally Reviewed  ECG    N/A  Physical Exam   GEN: No acute distress.   Neck: No JVD Cardiac: RRR, no murmurs, rubs, or gallops.  Respiratory: Clear to auscultation bilaterally. GI: Soft, nontender, non-distended  MS:  No deformity. 2+ BL LE edema with warping and erythemia Neuro:  Nonfocal  Psych: Normal affect   Labs    Chemistry Recent Labs  Lab 01/31/18 0312 02/01/18 0504 02/02/18 0458  NA 142 140 140  K 4.3 4.2 4.1  CL 104 102 98  CO2 26 27 30   GLUCOSE 122* 122* 143*  BUN 14 14 12   CREATININE 1.09* 1.15* 1.09*  CALCIUM 8.4* 8.7* 8.8*  GFRNONAA 46* 43* 46*  GFRAA 53* 50* 53*  ANIONGAP 12 11 12      Hematology Recent Labs  Lab 01/30/18 1530  WBC  7.3  RBC 4.96  HGB 14.4  HCT 46.6*  MCV 94.0  MCH 29.0  MCHC 30.9  RDW 16.4*  PLT 196    Cardiac EnzymesNo results for input(s): TROPONINI in the last 168 hours. No results for input(s): TROPIPOC in the last 168 hours.   BNP Recent Labs  Lab 01/30/18 1530  BNP 193.5*     DDimer No results for input(s): DDIMER in the last 168 hours.   Radiology    No results found.  Cardiac Studies   Echo 01/31/18 Study Conclusions  - Left ventricle: The cavity size was normal. Wall thickness was   increased in a pattern of moderate LVH. Systolic function was   normal. The estimated ejection fraction was in the range of 60%   to 65%. Wall motion was normal; there were no regional wall   motion abnormalities. - Mitral valve: Severely calcified annulus. - Left atrium: The atrium was severely dilated. - Right atrium: The atrium was mildly dilated. - Pulmonary arteries: Systolic pressure was mildly increased. PA   peak pressure: 44 mm Hg (S). - Pericardium, extracardiac: A small pericardial effusion was   identified.  Impressions:  -  Normal LV systolic function; moderate LVH; biatrial enlargement;   trace TR with mild pulmonary hypertension; small pericardial   effusion.  Patient Profile     84 y.o. female with hx of Persistent atrial fibrillation on Eliquis, St [employer] PPM in situ, HTN, carotid artery disease, chronic diastolic CHF, non-compliance with medication admitted from clinic for IV diuresis.   Assessment & Plan    1. Lower extremity edema/ Acute on chronic diastolic CHF  with wound and cellulitis -- Admitted from clinic for IV diuresis in setting of non compliance.   Echo showed LVEF of 60-65%, no WM abnormality. BNP 193. - Sx not likely related to Chronic DHF (HFpEF).   Net I & O negative 5.2L. Renal function stable.   Her edema is also due to possible LE disease. Wound care evaluation is pending on abx.   LE study per MD.  - Still has significant LE edema with  Erythremia .   Continue IV lasix for now - renal Fxn stable. .   2. Persistent atrial fibrillation - Hx of non compliance with Eliquis. Taking here. Rate stable.     SignedSharrell Ku [place], PA  02/02/2018, 8:44 AM    I have seen, examined and evaluated the patient this AM along with Mr. Katherine Cervantes, [place].  After reviewing all the available data and chart, we discussed the patients laboratory, study & physical findings as well as symptoms in detail. I agree with his findings, examination as well as impression recommendations as per our discussion.    Attending adjustments noted in italics.   Probably still needs a few more days IV Lasix  Continue Abx LE Venous dopplers- -not likely DVT (but has not be compliant with Eliquis) - eval for DVT vs. V stasis.   Katherine Cervantes, M.D., M.S. Interventional Cardiologist   Pager # [phone] Phone # [phone] [address] [place], [place] [address]  For questions or updates, please contact [facility] Please consult [email or web address] for contact info under

## 2018-02-02 NOTE — Care Management Important Message (Signed)
Important Message  Patient Details  Name: Katherine Cervantes MRN: [id number] Date of Birth: January 15, 1930   Medicare Important Message Given:  Yes    Cale Bethard P Emilyrose Darrah 02/02/2018, 3:13 PM

## 2018-02-03 DIAGNOSIS — L03116 Cellulitis of left lower limb: Secondary | ICD-10-CM

## 2018-02-03 LAB — BASIC METABOLIC PANEL
Anion gap: 12 (ref 5–15)
BUN: 12 mg/dL (ref 8–23)
CO2: 30 mmol/L (ref 22–32)
Calcium: 8.7 mg/dL — ABNORMAL LOW (ref 8.9–10.3)
Chloride: 95 mmol/L — ABNORMAL LOW (ref 98–111)
Creatinine, Ser: 1.06 mg/dL — ABNORMAL HIGH (ref 0.44–1.00)
GFR calc Af Amer: 55 mL/min — ABNORMAL LOW (ref >60)
GFR calc non Af Amer: 48 mL/min — ABNORMAL LOW (ref >60)
Glucose, Bld: 138 mg/dL — ABNORMAL HIGH (ref 70–99)
POTASSIUM: 3.5 mmol/L (ref 3.5–5.1)
Sodium: 137 mmol/L (ref 135–145)

## 2018-02-03 MED ORDER — POTASSIUM CHLORIDE CRYS ER 20 MEQ PO TBCR
40.0000 meq | EXTENDED_RELEASE_TABLET | Freq: Every day | ORAL | Status: DC
  Administered 2018-02-04 – 2018-02-09 (×4): 40 meq via ORAL
  Filled 2018-02-03 (×4): qty 2

## 2018-02-03 MED ORDER — POTASSIUM CHLORIDE CRYS ER 20 MEQ PO TBCR
40.0000 meq | EXTENDED_RELEASE_TABLET | Freq: Once | ORAL | Status: AC
  Administered 2018-02-03: 40 meq via ORAL
  Filled 2018-02-03: qty 2

## 2018-02-03 MED ORDER — FUROSEMIDE 80 MG PO TABS
80.0000 mg | ORAL_TABLET | Freq: Every day | ORAL | Status: DC
  Administered 2018-02-03 – 2018-02-09 (×7): 80 mg via ORAL
  Filled 2018-02-03 (×7): qty 1

## 2018-02-03 NOTE — Progress Notes (Signed)
C/O Chest pain, SOB and nausea. EKG done. NTG 0.4 mg sl given along with Zofran. Sats noted in 60's on RA. Lips cyanotic. O2 applied at 5L [place] and repositioned in bed. Sats returned to 98%. O2 decreased to 2L. Glorious Peach, PA notified. No new orders received. Daughter into room at bedside.

## 2018-02-03 NOTE — Evaluation (Signed)
Physical Therapy Evaluation Patient Details Name: Katherine Cervantes MRN: [id number] DOB: 1930/01/24 Today's Date: 02/03/2018   History of Present Illness  84 y.o. female with hx of Persistent atrial fibrillation on Eliquis, St Jude PPM in situ, HTN, carotid artery disease, chronic diastolic CHF, non-compliance with medication admitted from clinic for IV diuresis.   Clinical Impression  Pt admitted with above diagnosis. Pt currently with functional limitations due to the deficits listed below (see PT Problem List). Pt was able to stand but needed mod assist of 2 persons and could only stand 1 minute.  Pt could not maintain balance enough to take steps.  Will follow acutely.  Needs SNF for rehab as pt lived alone and family cannot provide 24 hour care.   Pt will benefit from skilled PT to increase their independence and safety with mobility to allow discharge to the venue listed below.      Follow Up Recommendations SNF;Supervision/Assistance - 24 hour    Equipment Recommendations  None recommended by PT    Recommendations for Other Services       Precautions / Restrictions Precautions Precautions: Fall Restrictions Weight Bearing Restrictions: No      Mobility  Bed Mobility               General bed mobility comments: in chair on arrival.   Transfers Overall transfer level: Needs assistance Equipment used: Rolling walker (2 wheeled) Transfers: Sit to/from Stand Sit to Stand: Mod assist;+2 physical assistance         General transfer comment: Pt needed mod assist to power up with posterior lean and took incr time for pt to maintain balance. Stood for 1 minute with cues to move her feet but pt unable to take steps.  Pt very fearful of falling and needec controlled descent into chair.   Ambulation/Gait             General Gait Details: unable to take steps  Stairs            Wheelchair Mobility    Modified Rankin (Stroke Patients Only)        Balance Overall balance assessment: Needs assistance         Standing balance support: Bilateral upper extremity supported;During functional activity Standing balance-Leahy Scale: Poor Standing balance comment: relies on UES for support                             Pertinent Vitals/Pain Pain Assessment: No/denies pain    Home Living Family/patient expects to be discharged to:: Private residence Living Arrangements: Alone Available Help at Discharge: Family Type of Home: House Home Access: Stairs to enter Entrance Stairs-Rails: None Entrance Stairs-Number of Steps: 1   Home Equipment: Environmental consultant - 4 wheels;Bedside commode;Tub bench;Transport chair      Prior Function Level of Independence: Needs assistance   Gait / Transfers Assistance Needed: used 4 wheeled walker inside andoutside.   ADL's / Homemaking Assistance Needed: Bathing and dressing on own        Hand Dominance   Dominant Hand: Left    Extremity/Trunk Assessment   Upper Extremity Assessment Upper Extremity Assessment: Defer to OT evaluation    Lower Extremity Assessment Lower Extremity Assessment: RLE deficits/detail;LLE deficits/detail RLE Deficits / Details: grossly 3/5 RLE: Unable to fully assess due to pain LLE Deficits / Details: grossly 3/5 LLE: Unable to fully assess due to pain    Cervical / Trunk Assessment Cervical / Trunk  Assessment: Kyphotic  Communication   Communication: No difficulties  Cognition Arousal/Alertness: Awake/alert Behavior During Therapy: WFL for tasks assessed/performed Overall Cognitive Status: History of cognitive impairments - at baseline                                        General Comments      Exercises General Exercises - Lower Extremity Ankle Circles/Pumps: AROM;Both;10 reps;Seated Long Arc Quad: AROM;Both;10 reps;Seated Hip Flexion/Marching: AROM;Both;10 reps;Seated   Assessment/Plan    PT Assessment Patient needs  continued PT services  PT Problem List Decreased activity tolerance;Decreased range of motion;Decreased strength;Decreased balance;Decreased mobility;Decreased knowledge of use of DME;Decreased safety awareness;Decreased knowledge of precautions;Pain;Obesity       PT Treatment Interventions DME instruction;Gait training;Functional mobility training;Therapeutic activities;Therapeutic exercise;Balance training;Patient/family education;Stair training    PT Goals (Current goals can be found in the Care Plan section)  Acute Rehab PT Goals Patient Stated Goal: to go to Rehab PT Goal Formulation: With patient Time For Goal Achievement: 02/17/18 Potential to Achieve Goals: Good    Frequency Min 3X/week   Barriers to discharge Decreased caregiver support      Co-evaluation               AM-PAC PT "6 Clicks" Mobility  Outcome Measure Help needed turning from your back to your side while in a flat bed without using bedrails?: A Lot Help needed moving from lying on your back to sitting on the side of a flat bed without using bedrails?: A Lot Help needed moving to and from a bed to a chair (including a wheelchair)?: A Lot Help needed standing up from a chair using your arms (e.g., wheelchair or bedside chair)?: A Lot Help needed to walk in hospital room?: Total Help needed climbing 3-5 steps with a railing? : Total 6 Click Score: 10    End of Session Equipment Utilized During Treatment: Gait belt Activity Tolerance: Patient limited by fatigue;Patient limited by pain Patient left: in chair;with call bell/phone within reach;with family/visitor present Nurse Communication: Mobility status PT Visit Diagnosis: Unsteadiness on feet (R26.81);Muscle weakness (generalized) (M62.81);Pain Pain - Right/Left: (bil) Pain - part of body: Leg    Time: 1038-1100 PT Time Calculation (min) (ACUTE ONLY): 22 min   Charges:   PT Evaluation $PT Eval Moderate Complexity: 1 Mod          Jourdan Maldonado,PT Acute Rehabilitation Services Pager:  [phone]  Office:  [phone]    Berline Lopes 02/03/2018, 11:47 AM

## 2018-02-03 NOTE — Progress Notes (Addendum)
Progress Note  Patient Name: Katherine Cervantes Date of Encounter: 02/03/2018  Primary Cardiologist: Kirk Ruths, MD   Subjective   Breathing better.  Legs less painful. Brisk diuresis  Inpatient Medications    Scheduled Meds: . apixaban  5 mg Oral BID  . atorvastatin  40 mg Oral q1800  . clindamycin  150 mg Oral TID  . furosemide  80 mg Intravenous BID  . potassium chloride  40 mEq Oral BID  . sodium chloride flush  3 mL Intravenous Q12H   Continuous Infusions: . sodium chloride     PRN Meds: sodium chloride, acetaminophen, ALPRAZolam, nitroGLYCERIN, ondansetron (ZOFRAN) IV, sodium chloride flush, traMADol-acetaminophen, zolpidem   Vital Signs    Vitals:   02/02/18 1959 02/02/18 2311 02/03/18 0500 02/03/18 0525  BP: (!) 160/78   (!) 157/68  Pulse: 78   73  Resp:      Temp: 98.8 F (37.1 C)   98.4 F (36.9 C)  TempSrc: Oral   Oral  SpO2: 94%   91%  Weight:  115.1 kg 115.1 kg   Height:        Intake/Output Summary (Last 24 hours) at 02/03/2018 1002 Last data filed at 02/03/2018 0953 Gross per 24 hour  Intake 123 ml  Output 4900 ml  Net -4777 ml   Filed Weights   02/02/18 0449 02/02/18 2311 02/03/18 0500  Weight: 117.8 kg 115.1 kg 115.1 kg    Telemetry    Maintaining A. fib in 70s.- Personally Reviewed  ECG    N/A  Physical Exam   GEN:  Pleasantly confused.  NAD. Daughter not present Neck: No JVD Cardiac:  Irregularly irregular rhythm with normal rate., no M/R/G Respiratory:  CTA B, nonlabored, good air movement GI:  Obese, soft/NT/ND says NABS.  No HSM. MS:  Bilateral legs with wrapping in place.  Edema notably improved.  Now 1-2+.  Erythema present, but no longer excessively warm. Neuro:  Nonfocal. Psych: Normal mood and affect.  Mildly confused.  Labs    Chemistry Recent Labs  Lab 02/01/18 0504 02/02/18 0458 02/03/18 0329  NA 140 140 137  K 4.2 4.1 3.5  CL 102 98 95*  CO2 _0 GLUCOSE 122* 143* 138*  BUN _1 CREATININE 1.15* 1.09* 1.06*  CALCIUM 8.7* 8.8* 8.7*  GFRNONAA 43* 46* 48*  GFRAA 50* 53* 55*  ANIONGAP _2 Hematology Recent Labs  Lab 01/30/18 1530  WBC 7.3  RBC 4.96  HGB 14.4  HCT 46.6*  MCV 94.0  MCH 29.0  MCHC 30.9  RDW 16.4*  PLT 196    Cardiac EnzymesNo results for input(s): TROPONINI in the last 168 hours. No results for input(s): TROPIPOC in the last 168 hours.   BNP Recent Labs  Lab 01/30/18 1530  BNP 193.5*     DDimer No results for input(s): DDIMER in the last 168 hours.   Radiology    No results found.  Cardiac Studies    Echo 01/31/18: Normal LV size and function.  EF 60-65%.  No R WMA.  Severe MAC.  Severe LA dilation.  Mild RA dilation.  Mildly increased PA pressure 44 mmHg.  Small pericardial effusion.   Patient Profile     84 y.o. female with hx of Persistent atrial fibrillation on Eliquis, St Jude PPM in situ, HTN, carotid artery disease, chronic diastolic CHF, non-compliance with medication admitted from clinic for IV diuresis.  Has been diuresing  briskly with steady improvement in renal function.  Assessment & Plan    1. Lower extremity edema/ Acute on chronic diastolic CHF w/ wound and cellulitis -- Admitted from clinic for IV diuresis in setting of non compliance.   Echo showed LVEF of 60-65%, no WM abnormality. BNP 193. -This would suggest that the edema is probably not cardiac in nature.  More related to venous stasis..   Net I & O negative 9.6+ liters.  Renal function stable to improving.    I think she is probably reaching endpoint for IV diuresis, will switch to oral 80 mg dose tonight and then place her on 80 mg daily p.o. in the morning with probably additional 40 mg as needed for weight gain greater than 3 pounds.  We will need to discuss sliding scale Lasix on discharge.  Anticipate that she would be ready to discharge as early as tomorrow.  Her edema is also due to possible LE disease.   Can consider outpatient  lower extremity venous Doppler study as an outpatient  Wound care evaluation is pending on abx. -  Plan 10-day course of clindamycin for CELLULITIS.  -Notably improved LE edema with less erythremia .   Continue Ace wraps  Convert to oral Lasix this afternoon.  Renal Fxn stable. .   2. Persistent atrial fibrillation -rate controlled without beta-blocker or calcium channel blocker..  Now back on Eliquis.   With plan discharge tomorrow, will have physical therapy evaluation as well as social work/case management to see for possible home health requirements etc. May benefit from [facility] outpatient home health services to assist with medical management given her history of nonadherence.   Signed, Glenetta Hew, MD  02/03/2018, 10:02 AM    Glenetta Hew, M.D., M.S. Interventional Cardiologist   Pager # [phone] Phone # [phone] [address] [place],  [address]  For questions or updates, please contact [place] Please consult [email or web address] for contact info under

## 2018-02-03 NOTE — Progress Notes (Signed)
OT Cancellation Note  Patient Details Name: Katherine Cervantes MRN: [id number] DOB: Feb 11, 1930   Cancelled Treatment:    Reason Eval/Treat Not Completed: Medical issues which prohibited therapy(pt with HTN and RN had to give nitroglyceride. Hold.)   Revonda Standard Cecil Cranker) Glendell Docker OTR/L Acute Rehabilitation Services Pager: [phone] Office: [phone]  Sandrea Hughs 02/03/2018, 2:46 PM

## 2018-02-04 MED ORDER — FUROSEMIDE 10 MG/ML IJ SOLN
40.0000 mg | Freq: Once | INTRAMUSCULAR | Status: AC
  Administered 2018-02-04: 40 mg via INTRAVENOUS
  Filled 2018-02-04: qty 4

## 2018-02-04 NOTE — Progress Notes (Signed)
Progress Note  Patient Name: Katherine Cervantes Date of Encounter: 02/04/2018  Primary Cardiologist: Kirk Ruths, MD   Subjective   Had a brief episode of dyspnea yesterday were noted to have perioral cyanosis documented by the nurse.  Unfortunately this was not seen and commented on by the PA upon evaluation. Currently resting comfortably in the chair eating breakfast.  No complaints of dyspnea.  Still mild edema, but has improved.  Legs are still tender to palpation.  Inpatient Medications    Scheduled Meds: . apixaban  5 mg Oral BID  . atorvastatin  40 mg Oral q1800  . clindamycin  150 mg Oral TID  . furosemide  80 mg Oral Daily  . potassium chloride  40 mEq Oral Daily  . sodium chloride flush  3 mL Intravenous Q12H   Continuous Infusions: . sodium chloride     PRN Meds: sodium chloride, acetaminophen, ALPRAZolam, nitroGLYCERIN, ondansetron (ZOFRAN) IV, sodium chloride flush, traMADol-acetaminophen, zolpidem   Vital Signs    Vitals:   02/03/18 1325 02/03/18 1343 02/03/18 2119 02/04/18 0505  BP: (!) 150/70 (!) 150/70 (!) 143/58 (!) 144/61  Pulse: 75 79 63 (!) 57  Resp: (!) 25 17    Temp:   97.8 F (36.6 C) 97.8 F (36.6 C)  TempSrc:   Oral Oral  SpO2: (!) 64% 100% 90% (!) 86%  Weight:      Height:        Intake/Output Summary (Last 24 hours) at 02/04/2018 0911 Last data filed at 02/03/2018 1500 Gross per 24 hour  Intake -  Output 2300 ml  Net -2300 ml   Filed Weights   02/02/18 0449 02/02/18 2311 02/03/18 0500  Weight: 117.8 kg 115.1 kg 115.1 kg    Telemetry    Atrial fibrillation in the 60s and 70s- Personally Reviewed  ECG    N/A  Physical Exam   GEN:  Pleasantly confused.  NAD.  Feels fine according to her, close to baseline.  No more the dyspnea spells. Neck: No JVD Cardiac:  Rate controlled irregularly irregular rhythm.  No other M/R/G. Respiratory:  Nonlabored, CTA B.  Good air movement. GI:  Obese, soft/NT/ND/NABS.  No HSM. MS:   Still has bilateral Ace wraps in place.  Edema is improving as is the erythema.  Still has 2+ lower extremity edema.  Quite sensitive to even light touch. Neuro:  Nonfocal. Psych: Mildly confused, but answers questions appropriately.  Is excited to have grandson coming in today.  Labs    Chemistry Recent Labs  Lab 02/01/18 0504 02/02/18 0458 02/03/18 0329  NA 140 140 137  K 4.2 4.1 3.5  CL 102 98 95*  CO2 _0 GLUCOSE 122* 143* 138*  BUN _1 CREATININE 1.15* 1.09* 1.06*  CALCIUM 8.7* 8.8* 8.7*  GFRNONAA 43* 46* 48*  GFRAA 50* 53* 55*  ANIONGAP _2 Hematology Recent Labs  Lab 01/30/18 1530  WBC 7.3  RBC 4.96  HGB 14.4  HCT 46.6*  MCV 94.0  MCH 29.0  MCHC 30.9  RDW 16.4*  PLT 196    Cardiac EnzymesNo results for input(s): TROPONINI in the last 168 hours. No results for input(s): TROPIPOC in the last 168 hours.   BNP Recent Labs  Lab 01/30/18 1530  BNP 193.5*     DDimer No results for input(s): DDIMER in the last 168 hours.   Radiology    No results found.  Cardiac Studies  Echo 01/31/18: Normal LV size and function.  EF 60-65%.  No R WMA.  Severe MAC.  Severe LA dilation.  Mild RA dilation.  Mildly increased PA pressure 44 mmHg.  Small pericardial effusion.   Patient Profile     84 y.o. female with hx of Persistent atrial fibrillation on Eliquis, St Jude PPM in situ, HTN, carotid artery disease, chronic diastolic CHF, non-compliance with medication admitted from clinic for IV diuresis.  Has been diuresing briskly with steady improvement in renal function.  Assessment & Plan    1. Lower extremity edema/ Acute on chronic diastolic CHF w/ wound and cellulitis -- Admitted from clinic for IV diuresis in setting of non compliance.   Echo showed LVEF of 60-65%, no WM abnormality. BNP 193. -This would suggest that the edema is probably not cardiac in nature.  More related to venous stasis..   Net I & O negative 10.6 L.  Renal  function stable to improving.  -Reassess tomorrow  Still has edema, and may benefit from 1 more dose of IV Lasix today.  We converted to oral Lasix with standing dose of 80 mg starting this morning.  We will give an additional 40 mg IV today.  Reassess tomorrow.  If renal function remains stable, would anticipate hopefully discharge tomorrow if we can get home health assistance set up.  Her edema is also due to possible LE disease.   Plan outpatient lower extremity venous Doppler.  Wound care evaluation is pending on abx. -  Plan to complete 10-day course of clindamycin for Cellulitis  -Notably improved LE edema with less erythremia .   Continue Ace wraps  Convert to oral Lasix this afternoon.  Renal Fxn stable. .   2. Persistent atrial fibrillation -rate controlled without beta-blocker or calcium channel blocker..  Now back on Eliquis.  Episodic dyspnea: Seems to be improved today, but had some strange episode yesterday documented by the nurse, but not seen by PA.  Would like to have an episode free evening prior to discharge.  Initial plan was to attempt discharge today, but based on her episode of dyspnea last night and how deconditioned she is I think we need more time for allowing to set up potential home health either physical therapy or home health nurse aide. With her history of medical nonadherence, having a nursing home would potentially be beneficial.   Signed, Glenetta Hew, MD  02/04/2018, 9:11 AM    Glenetta Hew, M.D., M.S. Interventional Cardiologist   Pager # [phone] Phone # [phone] [address] [place], [place] [address]  For questions or updates, please contact [place] Please consult [email or web address] for contact info under

## 2018-02-05 DIAGNOSIS — R600 Localized edema: Secondary | ICD-10-CM

## 2018-02-05 DIAGNOSIS — I872 Venous insufficiency (chronic) (peripheral): Secondary | ICD-10-CM

## 2018-02-05 DIAGNOSIS — R5381 Other malaise: Secondary | ICD-10-CM

## 2018-02-05 DIAGNOSIS — L0390 Cellulitis, unspecified: Secondary | ICD-10-CM

## 2018-02-05 LAB — COMPREHENSIVE METABOLIC PANEL
ALT: 16 U/L (ref 0–44)
AST: 24 U/L (ref 15–41)
Albumin: 2.6 g/dL — ABNORMAL LOW (ref 3.5–5.0)
Alkaline Phosphatase: 42 U/L (ref 38–126)
Anion gap: 10 (ref 5–15)
BILIRUBIN TOTAL: 1.3 mg/dL — AB (ref 0.3–1.2)
BUN: 28 mg/dL — AB (ref 8–23)
CO2: 31 mmol/L (ref 22–32)
Calcium: 8.4 mg/dL — ABNORMAL LOW (ref 8.9–10.3)
Chloride: 93 mmol/L — ABNORMAL LOW (ref 98–111)
Creatinine, Ser: 1.22 mg/dL — ABNORMAL HIGH (ref 0.44–1.00)
GFR calc Af Amer: 46 mL/min — ABNORMAL LOW (ref >60)
GFR calc non Af Amer: 40 mL/min — ABNORMAL LOW (ref >60)
Glucose, Bld: 137 mg/dL — ABNORMAL HIGH (ref 70–99)
Potassium: 4.3 mmol/L (ref 3.5–5.1)
Sodium: 134 mmol/L — ABNORMAL LOW (ref 135–145)
Total Protein: 6.8 g/dL (ref 6.5–8.1)

## 2018-02-05 NOTE — Progress Notes (Signed)
Pt has to be reminded to keep O2 on via [place].  Pt frequently takes [place] off and sats drop to low 80's this shift.

## 2018-02-05 NOTE — NC FL2 (Addendum)
[place] MEDICAID FL2 LEVEL OF CARE SCREENING TOOL     IDENTIFICATION  Patient Name: Katherine Cervantes Birthdate: 05-16-1929 Sex: female Admission Date (Current Location): 01/30/2018  [facility] and [place] Number:  Producer, television/film/video and Address:  [facility], [address] [place], [place] [address]      Provider Number: [id number]  Attending Physician Name and Address:  Lewayne Bunting, MD  Relative Name and Phone Number:  Lorra Freeman, daughter, [phone]    Current Level of Care: Hospital Recommended Level of Care: Skilled Nursing Facility Prior Approval Number:    Date Approved/Denied:   PASRR Number: [id number]  Discharge Plan: SNF    Current Diagnoses: Patient Active Problem List   Diagnosis Date Noted  . CHF (congestive heart failure) (HCC) 01/30/2018  . Carpal tunnel syndrome, bilateral 12/09/2017  . Gout 12/09/2017  . Morbid obesity (HCC) 12/09/2017  . Rheumatoid arthritis (HCC) 12/09/2017  . Non compliance w medication regimen 12/09/2017  . Acute on chronic diastolic heart failure (HCC) 10/03/2016  . Atrial fibrillation, chronic 10/03/2016  . Chest tightness 10/03/2016  . HLD (hyperlipidemia) 10/03/2016  . History of cardiac pacemaker 2/2 CHB 10/03/2016  . Morbid obesity with BMI of 40.0-44.9, adult (HCC) 10/03/2016  . Short-term memory loss 10/03/2016  . Pressure injury of skin 10/03/2016  . Blood in stool 10/15/2015  . Encopresis 10/15/2015  . Incontinence 10/15/2015  . Bullous rash 08/28/2015  . Chronic anticoagulation 08/28/2015  . Cerebrovascular disease 08/24/2014  . Vasculitis (HCC) 04/12/2014  . Neck mass 02/08/2013  . Hyperlipidemia 01/13/2013  . Type 2 diabetes mellitus (HCC) 11/30/2012  . Diverticulosis 11/04/2011  . History of (heterozygous) Hemochromatosis 10/10/2011  . Nephrolithiasis - 0.3cm Right Ureter August 2013 10/07/2011  . Shingles outbreak 10/01/2011  . Dyslipidemia 09/27/2011  .  Hypertension 09/27/2011  . CAD (coronary artery disease) 09/27/2011  . Atrial fibrillation (HCC) 09/27/2011  . Sleep apnea 09/27/2011  . History of gout 09/27/2011  . Pacemaker 09/27/2011  . Rheumatoid arthritis(714.0) 09/27/2011  . Osteoporosis 09/27/2011    Orientation RESPIRATION BLADDER Height & Weight     Self, Time, Situation, Place  O2(nasal cannula 2L) Incontinent, External catheter Weight: 108.8 kg Height:  5' (152.4 cm)  BEHAVIORAL SYMPTOMS/MOOD NEUROLOGICAL BOWEL NUTRITION STATUS      Incontinent Diet(please see DC summary)  AMBULATORY STATUS COMMUNICATION OF NEEDS Skin   Extensive Assist Verbally Normal                       Personal Care Assistance Level of Assistance  Bathing, Feeding, Dressing Bathing Assistance: Maximum assistance Feeding assistance: Independent Dressing Assistance: Maximum assistance     Functional Limitations Info  Sight, Hearing, Speech Sight Info: Adequate Hearing Info: Adequate Speech Info: Adequate    SPECIAL CARE FACTORS FREQUENCY  PT (By licensed PT), OT (By licensed OT)     PT Frequency: 5x/week OT Frequency: 5x/week            Contractures Contractures Info: Not present    Additional Factors Info  Code Status, Allergies Code Status Info: Full Allergies Info: Aspirin, Other, Penicillins, Codeine, Morphine And Related, Sulfa Antibiotics, Ether, Levaquin Levofloxacin In D5w, Metoprolol, Prednisone, Procaine           Current Medications (02/05/2018):  This is the current hospital active medication list Current Facility-Administered Medications  Medication Dose Route Frequency Provider Last Rate Last Dose  . 0.9 %  sodium chloride infusion  250 mL Intravenous PRN  Barrett, Joline Salt, PA-C      . acetaminophen (TYLENOL) tablet 650 mg  650 mg Oral Q4H PRN Barrett, Joline Salt, PA-C   650 mg at 02/04/18 2154  . ALPRAZolam Prudy Feeler) tablet 0.25 mg  0.25 mg Oral TID PRN Wendall Stade, MD      . apixaban (ELIQUIS) tablet 5  mg  5 mg Oral BID Barrett, Rhonda G, PA-C   5 mg at 02/05/18 0937  . atorvastatin (LIPITOR) tablet 40 mg  40 mg Oral q1800 Barrett, Rhonda G, PA-C   40 mg at 02/04/18 1722  . clindamycin (CLEOCIN) capsule 150 mg  150 mg Oral TID Barrett, Rhonda G, PA-C   150 mg at 02/05/18 3536  . furosemide (LASIX) tablet 80 mg  80 mg Oral Daily Marykay Lex, MD   80 mg at 02/05/18 1443  . nitroGLYCERIN (NITROSTAT) SL tablet 0.4 mg  0.4 mg Sublingual Q5 Min x 3 PRN Barrett, Rhonda G, PA-C   0.4 mg at 02/03/18 1317  . ondansetron (ZOFRAN) injection 4 mg  4 mg Intravenous Q6H PRN Barrett, Rhonda G, PA-C   4 mg at 02/05/18 1133  . potassium chloride SA (K-DUR,KLOR-CON) CR tablet 40 mEq  40 mEq Oral Daily Marykay Lex, MD   40 mEq at 02/05/18 1540  . sodium chloride flush (NS) 0.9 % injection 3 mL  3 mL Intravenous Q12H Barrett, Rhonda G, PA-C   3 mL at 02/05/18 0938  . sodium chloride flush (NS) 0.9 % injection 3 mL  3 mL Intravenous PRN Barrett, Joline Salt, PA-C      . traMADol-acetaminophen (ULTRACET) 37.5-325 MG per tablet 1 tablet  1 tablet Oral Q6H PRN Barrett, Joline Salt, PA-C   1 tablet at 02/05/18 1132  . zolpidem (AMBIEN) tablet 5 mg  5 mg Oral QHS PRN Barrett, Joline Salt, PA-C   5 mg at 02/03/18 2147     Discharge Medications: Please see discharge summary for a list of discharge medications.  Relevant Imaging Results:  Relevant Lab Results:   Additional Information SSN: [id number]  Abigail Butts, LCSW

## 2018-02-05 NOTE — Progress Notes (Signed)
Progress Note  Patient Name: Katherine Cervantes Date of Encounter: 02/05/2018  Primary Cardiologist: Olga Millers, MD   Subjective   No recurrent periorbital cyanosis. Breathing is stable. PT/OT recommended SNF.   Inpatient Medications    Scheduled Meds: . apixaban  5 mg Oral BID  . atorvastatin  40 mg Oral q1800  . clindamycin  150 mg Oral TID  . furosemide  80 mg Oral Daily  . potassium chloride  40 mEq Oral Daily  . sodium chloride flush  3 mL Intravenous Q12H   Continuous Infusions: . sodium chloride     PRN Meds: sodium chloride, acetaminophen, ALPRAZolam, nitroGLYCERIN, ondansetron (ZOFRAN) IV, sodium chloride flush, traMADol-acetaminophen, zolpidem   Vital Signs    Vitals:   02/04/18 2016 02/05/18 0105 02/05/18 0534 02/05/18 0817  BP: 136/66  133/64 (!) 143/66  Pulse: 92  64 68  Resp: (!) 23  11 16   Temp: 99.6 F (37.6 C) 98.2 F (36.8 C) 98.4 F (36.9 C) 98.3 F (36.8 C)  TempSrc: Oral Oral Oral Oral  SpO2:   99% 95%  Weight:   108.8 kg   Height:        Intake/Output Summary (Last 24 hours) at 02/05/2018 1029 Last data filed at 02/04/2018 2200 Gross per 24 hour  Intake 840 ml  Output 1750 ml  Net -910 ml   Filed Weights   02/02/18 2311 02/03/18 0500 02/05/18 0534  Weight: 115.1 kg 115.1 kg 108.8 kg    Telemetry    afib at 60-70s- Personally Reviewed  ECG    N/A  Physical Exam   GEN: Obese female in no acute distress.   Neck: No JVD Cardiac: IR IR, no murmurs, rubs, or gallops.  Respiratory: Clear to auscultation bilaterally. GI: Soft, nontender, non-distended  MS: 1+ BL LE edema with ACE warps; No deformity. Neuro:  Nonfocal  Psych: Normal affect   Labs    Chemistry Recent Labs  Lab 02/02/18 0458 02/03/18 0329 02/05/18 0336  NA 140 137 134*  K 4.1 3.5 4.3  CL 98 95* 93*  CO2 30 30 31   GLUCOSE 143* 138* 137*  BUN 12 12 28*  CREATININE 1.09* 1.06* 1.22*  CALCIUM 8.8* 8.7* 8.4*  PROT  --   --  6.8  ALBUMIN  --    --  2.6*  AST  --   --  24  ALT  --   --  16  ALKPHOS  --   --  42  BILITOT  --   --  1.3*  GFRNONAA 46* 48* 40*  GFRAA 53* 55* 46*  ANIONGAP 12 12 10      Hematology Recent Labs  Lab 01/30/18 1530  WBC 7.3  RBC 4.96  HGB 14.4  HCT 46.6*  MCV 94.0  MCH 29.0  MCHC 30.9  RDW 16.4*  PLT 196    BNP Recent Labs  Lab 01/30/18 1530  BNP 193.5*     Radiology    No results found.  Cardiac Studies   Echo 01/31/18 Study Conclusions  - Left ventricle: The cavity size was normal. Wall thickness was   increased in a pattern of moderate LVH. Systolic function was   normal. The estimated ejection fraction was in the range of 60%   to 65%. Wall motion was normal; there were no regional wall   motion abnormalities. - Mitral valve: Severely calcified annulus. - Left atrium: The atrium was severely dilated. - Right atrium: The atrium was mildly dilated. - Pulmonary  arteries: Systolic pressure was mildly increased. PA   peak pressure: 44 mm Hg (S). - Pericardium, extracardiac: A small pericardial effusion was   identified.  Impressions:  - Normal LV systolic function; moderate LVH; biatrial enlargement;   trace TR with mild pulmonary hypertension; small pericardial   effusion.   Patient Profile     84 y.o. female hx of Persistent atrial fibrillation on Eliquis, St Jude PPM in situ, HTN, carotid artery disease, chronic diastolic CHF, non-compliance with medication admitted from clinic for IV diuresis.   Assessment & Plan    1. Lower extremity edema/ Acute on chronic diastolic CHF w/ wound and cellulitis -- Admitted from clinic for IV diuresis in setting of non compliance.   Echo showed LVEF of 60-65%, no WM abnormality. BNP 193. -This would suggest that the edema is probably not cardiac in nature.  More related to venous stasis.  She diuresed total 11.2L. weight down 18lb (267>>>239). Now on PO lasix.  Renal function stable  Plan to complete 10 days of abx for  cellulitis  ? LE arterial doppler  2. Persistent atrial fibrillation   Rate controlled without beta-blocker or calcium channel blocker. Now back on Eliquis.  3. Episodic dyspnea   Resolved with diuresis  4. Deconditioning - PT/OT recommended SNF. SW and CM on board.    For questions or updates, please contact [facility] Please consult [email or web address] for contact info under        SignedManson Passey, PA  02/05/2018, 10:29 AM

## 2018-02-05 NOTE — Plan of Care (Signed)
  Problem: Education: Goal: Knowledge of General Education information will improve Description Including pain rating scale, medication(s)/side effects and non-pharmacologic comfort measures Outcome: Progressing   Problem: Health Behavior/Discharge Planning: Goal: Ability to manage health-related needs will improve Outcome: Progressing   Problem: Clinical Measurements: Goal: Ability to maintain clinical measurements within normal limits will improve Outcome: Progressing Goal: Respiratory complications will improve Outcome: Progressing   Problem: Nutrition: Goal: Adequate nutrition will be maintained Outcome: Progressing   Problem: Pain Managment: Goal: General experience of comfort will improve Outcome: Progressing

## 2018-02-05 NOTE — Evaluation (Signed)
Occupational Therapy Evaluation Patient Details Name: Katherine Cervantes MRN: [id number] DOB: [birth year] Today's Date: 02/05/2018    History of Present Illness 84 y.o. female with recent dyspnea and NTG required for episode 12/24 with chest pain. pt with hx of Persistent atrial fibrillation on Eliquis, St Jude PPM in situ, HTN, carotid artery disease, chronic diastolic CHF, non-compliance with medication admitted from clinic for IV diuresis.    Clinical Impression    Pt admitted with above diagnosis. Pt currently with functional limitiations due to the deficits listed below (see OT problem list). Pt with weakness, decreased mobility and inability to properly care for self. Pt require O2 at this time on 2L for sats to increase from 86% with activity to 96%. No chest pain at this time. Pt performing sitting EOB and attempted sit to stand, but required increased time and BLEs with increased pain. Pt will benefit from skilled OT to increase their independence and safety with adls and balance to allow discharge to SNF.   Follow Up Recommendations  SNF    Equipment Recommendations  3 in 1 bedside commode    Recommendations for Other Services       Precautions / Restrictions Precautions Precautions: Fall Restrictions Weight Bearing Restrictions: No      Mobility Bed Mobility Overal bed mobility: Needs Assistance Bed Mobility: Rolling;Sidelying to Sit;Supine to Sit;Sit to Supine;Sit to Sidelying Rolling: Max assist Sidelying to sit: Max assist;HOB elevated Supine to sit: Max assist;HOB elevated Sit to supine: Max assist;HOB elevated Sit to sidelying: Max assist;HOB elevated    Transfers Overall transfer level: Needs assistance Equipment used: Rolling walker (2 wheeled) Transfers: Sit to/from Stand Sit to Stand: Max assist         General transfer comment: only able to attempt sit to stand; BLEs too weak to hold self upright    Balance Overall balance assessment: Needs  assistance Sitting-balance support: Bilateral upper extremity supported Sitting balance-Leahy Scale: Good     Standing balance support: Bilateral upper extremity supported;During functional activity Standing balance-Leahy Scale: Poor                             ADL either performed or assessed with clinical judgement   ADL Overall ADL's : Needs assistance/impaired Eating/Feeding: Set up   Grooming: Wash/dry hands;Wash/dry face;Oral care;Minimal assistance;Sitting   Upper Body Bathing: Moderate assistance;Sitting   Lower Body Bathing: Maximal assistance;Sitting/lateral leans   Upper Body Dressing : Moderate assistance;Sitting   Lower Body Dressing: Sitting/lateral leans;Maximal assistance;Bed level   Toilet Transfer: Maximal assistance;+2 for physical assistance;+2 for safety/equipment   Toileting- Clothing Manipulation and Hygiene: Maximal assistance;+2 for physical assistance;+2 for safety/equipment;Sit to/from stand       Functional mobility during ADLs: Maximal assistance;+2 for physical assistance;+2 for safety/equipment;Rolling walker(due to pain in BLEs) General ADL Comments: requires assist due to pain in BLEs     Vision Baseline Vision/History: No visual deficits Patient Visual Report: No change from baseline Vision Assessment?: No apparent visual deficits     Perception     Praxis      Pertinent Vitals/Pain Pain Assessment: Faces Faces Pain Scale: Hurts even more Pain Location: BLEs LLE >RLE Pain Descriptors / Indicators: Jabbing;Grimacing;Guarding;Discomfort Pain Intervention(s): Limited activity within patient's tolerance;Repositioned;Monitored during session     Hand Dominance Left   Extremity/Trunk Assessment Upper Extremity Assessment Upper Extremity Assessment: Overall WFL for tasks assessed   Lower Extremity Assessment Lower Extremity Assessment: Generalized weakness RLE Deficits / Details:  grossly 3/5 RLE: Unable to fully assess  due to pain LLE: Unable to fully assess due to pain   Cervical / Trunk Assessment Cervical / Trunk Assessment: Kyphotic   Communication Communication Communication: No difficulties   Cognition Arousal/Alertness: Awake/alert Behavior During Therapy: WFL for tasks assessed/performed Overall Cognitive Status: History of cognitive impairments - at baseline                                 General Comments: pt following commands throughout session   General Comments       Exercises General Exercises - Lower Extremity Ankle Circles/Pumps: AROM;Both;10 reps;Seated   Shoulder Instructions      Home Living Family/patient expects to be discharged to:: Private residence Living Arrangements: Alone Available Help at Discharge: Family Type of Home: House Home Access: Stairs to enter [employer] of Steps: 1 Entrance Stairs-Rails: None       Bathroom Shower/Tub: Chief Strategy Officer: Standard     Home Equipment: Environmental consultant - 4 wheels;Bedside commode;Tub bench;Transport chair          Prior Functioning/Environment Level of Independence: Needs assistance  Gait / Transfers Assistance Needed: used 4 wheeled walker inside andoutside.  ADL's / Homemaking Assistance Needed: Bathing and dressing on own; IADLs assisted from family intermittently. Pt was not driving.            OT Problem List: Decreased strength;Decreased activity tolerance;Decreased safety awareness;Increased edema      OT Treatment/Interventions: Self-care/ADL training;Therapeutic exercise;Energy conservation;Therapeutic activities;Patient/family education;Balance training    OT Goals(Current goals can be found in the care plan section) Acute Rehab OT Goals Patient Stated Goal: to go to Rehab OT Goal Formulation: With patient Time For Goal Achievement: 02/05/18 Potential to Achieve Goals: Good  OT Frequency: Min 2X/week   Barriers to D/C: Decreased caregiver support           Co-evaluation              AM-PAC OT "6 Clicks" Daily Activity     Outcome Measure Help from another person eating meals?: None Help from another person taking care of personal grooming?: A Little Help from another person toileting, which includes using toliet, bedpan, or urinal?: A Lot Help from another person bathing (including washing, rinsing, drying)?: A Lot Help from another person to put on and taking off regular upper body clothing?: A Lot Help from another person to put on and taking off regular lower body clothing?: A Lot 6 Click Score: 15   End of Session Equipment Utilized During Treatment: Rolling walker Nurse Communication: Mobility status  Activity Tolerance: Patient limited by pain;Patient limited by fatigue Patient left: in bed;with bed alarm set;with call bell/phone within reach  OT Visit Diagnosis: Unsteadiness on feet (R26.81);Other abnormalities of gait and mobility (R26.89);Muscle weakness (generalized) (M62.81)                Time: 9798-9211 OT Time Calculation (min): 32 min Charges:  OT General Charges $OT Visit: 1 Visit OT Evaluation $OT Eval Moderate Complexity: 1 Mod OT Treatments $Therapeutic Activity: 8-22 mins  Revonda Standard Cecil Cranker) Glendell Docker OTR/L Acute Rehabilitation Services Pager: [phone] Office: [phone]   Sandrea Hughs 02/05/2018, 9:18 AM

## 2018-02-05 NOTE — Clinical Social Work Note (Signed)
Clinical Social Work Assessment  Patient Details  Name: Katherine Cervantes MRN: [id number] Date of Birth: Sep 01, 1929  Date of referral:  02/05/18               Reason for consult:  Discharge Planning, Facility Placement                Permission sought to share information with:  Facility Sport and exercise psychologist, Family Supports Permission granted to share information::  Yes, Verbal Permission Granted  Name::     Alveena Taira and April Chadwicks::  SNFs  Relationship::  daughters  Contact Information:  [phone] and [phone]  Housing/Transportation Living arrangements for the past 2 months:  [place] of Information:  Patient, Adult Children Patient Interpreter Needed:  None Criminal Activity/Legal Involvement Pertinent to Current Situation/Hospitalization:  No - Comment as needed Significant Relationships:  Adult Children Lives with:  Self Do you feel safe going back to the place where you live?  Yes Need for family participation in patient care:  Yes (Comment)  Care giving concerns: Patient from home independently. PT recommending SNF.    Social Worker assessment / plan: CSW met with patient and daughter, April, at bedside. Patient alert and oriented. CSW discussed PT recommendations for SNF at discharge. Patient and daughter agreeable to SNF. CSW explained insurance authorization process.  CSW faxed out initial SNF referrals. Provided a few bed offers that were available so far to patient and April. April requested that the bed offers also be emailed to patient's other daughter, Ladona Horns. [name] works for [employer] and is more familiar with SNFs.  Patient will require [facility] authorization before admitting to a facility. Facility will initiate auth once one is identified. Patient and family to review offers and CSW will follow up for choice. Will follow to support with discharge planning.  Employment status:  Retired Neurosurgeon) PT Recommendations:  [place] / Referral to community resources:  [place]  Patient/Family's Response to care: Patient appreciative of care.  Patient/Family's Understanding of and Emotional Response to Diagnosis, Current Treatment, and Prognosis: Patient and daughter with good understanding of patient's condition and agreeable to SNF.  Emotional Assessment Appearance:  Appears stated age Attitude/Demeanor/Rapport:  Engaged Affect (typically observed):  Accepting, Calm, Appropriate Orientation:  Oriented to Self, Oriented to Place, Oriented to  Time, Oriented to Situation Alcohol / Substance use:  Not Applicable Psych involvement (Current and /or in the community):  No (Comment)  Discharge Needs  Concerns to be addressed:  Discharge Planning Concerns, Care Coordination Readmission within the last 30 days:  No Current discharge risk:  Physical Impairment, Lives alone Barriers to Discharge:  Continued Medical Work up, [place], LCSW 02/05/2018, 2:07 PM

## 2018-02-06 LAB — BASIC METABOLIC PANEL
Anion gap: 11 (ref 5–15)
BUN: 25 mg/dL — ABNORMAL HIGH (ref 8–23)
CO2: 31 mmol/L (ref 22–32)
Calcium: 8.7 mg/dL — ABNORMAL LOW (ref 8.9–10.3)
Chloride: 94 mmol/L — ABNORMAL LOW (ref 98–111)
Creatinine, Ser: 1.01 mg/dL — ABNORMAL HIGH (ref 0.44–1.00)
GFR calc Af Amer: 58 mL/min — ABNORMAL LOW (ref >60)
GFR calc non Af Amer: 50 mL/min — ABNORMAL LOW (ref >60)
Glucose, Bld: 112 mg/dL — ABNORMAL HIGH (ref 70–99)
Potassium: 4.6 mmol/L (ref 3.5–5.1)
SODIUM: 136 mmol/L (ref 135–145)

## 2018-02-06 NOTE — Plan of Care (Signed)
  Problem: Education: Goal: Knowledge of General Education information will improve Description: Including pain rating scale, medication(s)/side effects and non-pharmacologic comfort measures Outcome: Progressing   Problem: Clinical Measurements: Goal: Ability to maintain clinical measurements within normal limits will improve Outcome: Progressing Goal: Respiratory complications will improve Outcome: Progressing   Problem: Activity: Goal: Risk for activity intolerance will decrease Outcome: Progressing   

## 2018-02-06 NOTE — Care Management Important Message (Signed)
Important Message  Patient Details  Name: Katherine Cervantes MRN: [id number] Date of Birth: 1929/05/30   Medicare Important Message Given:  Yes    Romain Erion P Savilla Turbyfill 02/06/2018, 4:53 PM

## 2018-02-06 NOTE — Progress Notes (Signed)
Physical Therapy Treatment Patient Details Name: Katherine Cervantes MRN: [id number] DOB: 09/21/1929 Today's Date: 02/06/2018    History of Present Illness 84 y.o. female with recent dyspnea and NTG required for episode 12/24 with chest pain. pt with hx of Persistent atrial fibrillation on Eliquis, St Jude PPM in situ, HTN, carotid artery disease, chronic diastolic CHF, non-compliance with medication admitted from clinic for IV diuresis.     PT Comments    Pt admitted with above diagnosis. Pt currently with functional limitations due to balance and endurance deficits. Pt was only able to come to EOB with mod assist due to refusal to do anything else with PT.  Pt upset and could not redirect pt despite multiple attempts.  Daughter present and appreciative of PT efforts.  Will continue to progress pt as pt allows.  Pt will benefit from skilled PT to increase their independence and safety with mobility to allow discharge to the venue listed below.     Follow Up Recommendations  SNF;Supervision/Assistance - 24 hour     Equipment Recommendations  None recommended by PT    Recommendations for Other Services       Precautions / Restrictions Precautions Precautions: Fall Restrictions Weight Bearing Restrictions: No    Mobility  Bed Mobility Overal bed mobility: Needs Assistance Bed Mobility: Supine to Sit     Supine to sit: Mod assist;HOB elevated     General bed mobility comments: Needed mod assist to come to EOB with pt pulling up on daughter as she would not agree for therapist to help her.   Transfers                 General transfer comment: Pt refused to stand up or work with PT unless PT got her a snack.  PT did get her a sandwich and coffee however then pt said she had do let it settle.  Incr time during session attempting to get pt to do anything with PT.   Ambulation/Gait                 Stairs             Wheelchair Mobility    Modified Rankin  (Stroke Patients Only)       Balance Overall balance assessment: Needs assistance Sitting-balance support: Bilateral upper extremity supported Sitting balance-Leahy Scale: Good                                      Cognition Arousal/Alertness: Awake/alert Behavior During Therapy: WFL for tasks assessed/performed Overall Cognitive Status: History of cognitive impairments - at baseline                                 General Comments: Pt very beligerant and difficult to redirect. Daughter in room but could not do much with pt either.       Exercises General Exercises - Lower Extremity Ankle Circles/Pumps: AROM;Both;Seated;5 reps Long Arc Quad: AROM;Both;Seated;5 reps    General Comments General comments (skin integrity, edema, etc.): Daughter present and very frustrated wtih her mother because she would not do anything much with PT.       Pertinent Vitals/Pain Pain Assessment: Faces Faces Pain Scale: Hurts even more Pain Location: BLEs LLE >RLE Pain Descriptors / Indicators: Jabbing;Grimacing;Guarding;Discomfort Pain Intervention(s): Limited activity within patient's tolerance;Monitored during session;Repositioned  Home Living                      Prior Function            PT Goals (current goals can now be found in the care plan section) Progress towards PT goals: Not progressing toward goals - comment(refusal to do more than sit EOB)    Frequency    Min 2X/week      PT Plan Current plan remains appropriate;Frequency needs to be updated    Co-evaluation              AM-PAC PT "6 Clicks" Mobility   Outcome Measure  Help needed turning from your back to your side while in a flat bed without using bedrails?: A Lot Help needed moving from lying on your back to sitting on the side of a flat bed without using bedrails?: A Lot Help needed moving to and from a bed to a chair (including a wheelchair)?: A Lot Help needed  standing up from a chair using your arms (e.g., wheelchair or bedside chair)?: Total Help needed to walk in hospital room?: Total Help needed climbing 3-5 steps with a railing? : Total 6 Click Score: 9    End of Session Equipment Utilized During Treatment: Gait belt Activity Tolerance: Patient limited by fatigue(self limiting) Patient left: with family/visitor present;with call bell/phone within reach(sitting EOB) Nurse Communication: Mobility status PT Visit Diagnosis: Unsteadiness on feet (R26.81);Muscle weakness (generalized) (M62.81);Pain Pain - Right/Left: (bil) Pain - part of body: Leg     Time: 1118-1140 PT Time Calculation (min) (ACUTE ONLY): 22 min  Charges:  $Therapeutic Activity: 8-22 mins                     Poet Hineman,PT Acute Rehabilitation Services Pager:  [phone]  Office:  [phone]     Berline Lopes 02/06/2018, 1:56 PM

## 2018-02-06 NOTE — Progress Notes (Signed)
Pt has been non-compliant with care this morning via refusing her am meds, refusing to wear her O2 and pulse oximeter.  Pt has taken it off along with her ID band.  Pt reports that she is leaving today with her daughter.  Pt is confused and disoriented to time and situation.  All attempts to reorient pt failed and has caused pt to became even more verbally aggressive. MD notified.

## 2018-02-06 NOTE — Progress Notes (Addendum)
4:25 pm [facility] has offered a bed for patient. CSW spoke to patient and daughter Ladona Horns on the phone and they are both agreeable to [facility]. [facility] admissions starting patient's [facility] auth today, awaiting determination. Authorization is required before patient can discharge to the SNF. CSW to follow.  3:06 pm Provided daughter, Ladona Horns with updated list of accepting SNFs. Daughter declines these available facilities. CSW placed follow up calls to [facility] and [facility], both of which were reviewing patient's referral. Neither have offered a bed yet, awaiting calls back with bed offers. Daughter also asked about rehab at [place] left message for [facility] admissions and faxed referral.  12:16 pm Spoke with Marita Kansas in admissions at [facility]. They are reviewing referral. Daughter April also asked about [facility] and [facility]. [facility] has declined referral. [facility] not in network with patient's insurance.  9:03 am CSW met with patient at bedside. Patient alert. CSW asked if patient and daughters had reviewed SNF list and had a choice. Patient deferred to her daughter, Ladona Horns, for choice.  CSW called [facility] and she provided a few preferences - [facility], [facility], [facility], and [facility]. CSW left message for [facility] admissions to inquire about bed availability, awaiting bed offer. The other three facilities are not in system for electronic referrals. [facility] and they do not have beds. [facility] faxed referral to [facility], awaiting bed offer.  Patient will need [facility] authorization prior to admitting to SNF. Awaiting bed offers. CSW to follow.  Estanislado Emms, LCSW [phone]

## 2018-02-06 NOTE — Progress Notes (Addendum)
Progress Note  Patient Name: Katherine Cervantes Date of Encounter: 02/06/2018  Primary Cardiologist: Olga Millers, MD   Subjective   Waiting SNF placement. SPO2 % dropped yesterday while off oxygen yesterday. Laying comfortably today without oxygen.   Inpatient Medications    Scheduled Meds: . apixaban  5 mg Oral BID  . atorvastatin  40 mg Oral q1800  . clindamycin  150 mg Oral TID  . furosemide  80 mg Oral Daily  . potassium chloride  40 mEq Oral Daily  . sodium chloride flush  3 mL Intravenous Q12H   Continuous Infusions: . sodium chloride     PRN Meds: sodium chloride, acetaminophen, ALPRAZolam, nitroGLYCERIN, ondansetron (ZOFRAN) IV, sodium chloride flush, traMADol-acetaminophen, zolpidem   Vital Signs    Vitals:   02/05/18 0817 02/05/18 1945 02/06/18 0500 02/06/18 0759  BP: (!) 143/66 (!) 146/71 (!) 155/63 (!) 123/55  Pulse: 68 64 (!) 56   Resp: 16 16 16 17   Temp: 98.3 F (36.8 C) 97.8 F (36.6 C) 98 F (36.7 C) 98 F (36.7 C)  TempSrc: Oral Oral Oral Oral  SpO2: 95% 90% 99%   Weight:   110.6 kg   Height:        Intake/Output Summary (Last 24 hours) at 02/06/2018 0839 Last data filed at 02/06/2018 0500 Gross per 24 hour  Intake 240 ml  Output 900 ml  Net -660 ml   Filed Weights   02/03/18 0500 02/05/18 0534 02/06/18 0500  Weight: 115.1 kg 108.8 kg 110.6 kg    Telemetry    Atrial fibrillation at rate of 60-70s - Personally Reviewed  ECG    N/A  Physical Exam   GEN: Obese female in no acute distress.   Neck: No JVD Cardiac: irregularly irregular , no murmurs, rubs, or gallops.  Respiratory: diminished breath sound at base GI: Soft, nontender, non-distended  MS: Trace edema with ACE warp; No deformity. Neuro:  Nonfocal  Psych: Normal affect   Labs    Chemistry Recent Labs  Lab 02/03/18 0329 02/05/18 0336 02/06/18 0740  NA 137 134* 136  K 3.5 4.3 4.6  CL 95* 93* 94*  CO2 30 31 31   GLUCOSE 138* 137* 112*  BUN 12 28* 25*    CREATININE 1.06* 1.22* 1.01*  CALCIUM 8.7* 8.4* 8.7*  PROT  --  6.8  --   ALBUMIN  --  2.6*  --   AST  --  24  --   ALT  --  16  --   ALKPHOS  --  42  --   BILITOT  --  1.3*  --   GFRNONAA 48* 40* 50*  GFRAA 55* 46* 58*  ANIONGAP 12 10 11      Hematology Recent Labs  Lab 01/30/18 1530  WBC 7.3  RBC 4.96  HGB 14.4  HCT 46.6*  MCV 94.0  MCH 29.0  MCHC 30.9  RDW 16.4*  PLT 196   BNP Recent Labs  Lab 01/30/18 1530  BNP 193.5*     DDimer No results for input(s): DDIMER in the last 168 hours.   Radiology    No results found.  Cardiac Studies   Echo 01/31/18 Study Conclusions  - Left ventricle: The cavity size was normal. Wall thickness was increased in a pattern of moderate LVH. Systolic function was normal. The estimated ejection fraction was in the range of 60% to 65%. Wall motion was normal; there were no regional wall motion abnormalities. - Mitral valve: Severely calcified annulus. -  Left atrium: The atrium was severely dilated. - Right atrium: The atrium was mildly dilated. - Pulmonary arteries: Systolic pressure was mildly increased. PA peak pressure: 44 mm Hg (S). - Pericardium, extracardiac: A small pericardial effusion was identified.  Impressions:  - Normal LV systolic function; moderate LVH; biatrial enlargement; trace TR with mild pulmonary hypertension; small pericardial effusion.   Patient Profile     84 y.o. female hx of Persistent atrial fibrillation on Eliquis, St Jude PPM in situ, HTN, carotid artery disease, chronic diastolic CHF, non-compliance with medication admitted from clinic for IV diuresis.   Assessment & Plan    1. Lower extremity edema/ Acute on chronic diastolic CHFw/ wound and cellulitis -- Admitted from clinic for IV diuresis in setting of non compliance.  Echo showed LVEF of 60-65%, no WM abnormality. BNP 193. -This would suggest that the edema is probably not cardiac in nature. More related  to venous stasis.  She diuresed total 11.9L, only 660CC yesterday. weight down 14lb (267>>>243), however up 4 lb since yesterday. Very closely to euvolemic. Breathing has been improved. Likely inaccurate weight today. Continue PO lasix, likely reduce tomorrow. Low Albumin. Needs to increase appetite.   Renal function stable  Plan to complete 10 days of abx for cellulitis  Consider LE arterial doppler  2. Persistent atrial fibrillation   Rate controlled without beta-blocker or calcium channel blocker.   Now back on Eliquis.  3. Episodic dyspnea   Resolved with diuresis. Oxygen saturation now stable off of O2.  Will remove O2 cannula.  4. Deconditioning - PT/OT recommended SNF. Waiting placement. Likely DC tomorrow per SW.   Mild delirium with sundowning.  Had some issues with verbal abusiveness this morning.  She seems to be quite stable now now that her daughter is in place.  I will simply write to removed telemetry and oxygen to allow her to have more freely mobility.  We will also try to minimize lab work etc. to avoid agitation. She seems to doing very well now I would prefer not to treat with any PRN medications.     SignedSharrell Ku [place], [place]  02/06/2018, 8:39 AM    ATTENDING ATTESTATION I have seen, examined and evaluated the patient this morning along with Mr. Iver Nestle, [place].  After reviewing all the available data and chart, we discussed the patients laboratory, study & physical findings as well as symptoms in detail. I agree with his findings, examination as well as impression recommendations as per our discussion.    Attending adjustments noted in italics.   Overall doing well.  Renal function is stabilized.  Her lower extremity edema has notably improved as has a cellulitis.  Will complete full course of 10 days antibiotics. Is on a stable dose of 80 mg oral Lasix with stabilization of renal function. Hopefully she will be able to be discharged to skilled nursing  facility tomorrow. In the interim, would like for her to continue to ambulate in the hallway with assistance and using a walker.  (She is well aware of the fact that she needs to have assistance with getting up and moving around.     Bryan Lemma, M.D., M.S. Interventional Cardiologist   Pager # [phone] Phone # [phone] [address] [place], [place] [address]  For questions or updates, please contact [facility] Please consult [email or web address] for contact info under

## 2018-02-07 DIAGNOSIS — I5031 Acute diastolic (congestive) heart failure: Secondary | ICD-10-CM

## 2018-02-07 NOTE — Progress Notes (Addendum)
Progress Note  Patient Name: Katherine Cervantes Date of Encounter: 02/07/2018  Primary Cardiologist: Olga Millers, MD   Subjective   Denies any chest pain or SOB.  LE edema continues to improve  Inpatient Medications    Scheduled Meds: . apixaban  5 mg Oral BID  . atorvastatin  40 mg Oral q1800  . clindamycin  150 mg Oral TID  . furosemide  80 mg Oral Daily  . potassium chloride  40 mEq Oral Daily  . sodium chloride flush  3 mL Intravenous Q12H   Continuous Infusions: . sodium chloride     PRN Meds: sodium chloride, acetaminophen, ALPRAZolam, nitroGLYCERIN, ondansetron (ZOFRAN) IV, sodium chloride flush, traMADol-acetaminophen, zolpidem   Vital Signs    Vitals:   02/05/18 1945 02/06/18 0500 02/06/18 0759 02/07/18 0500  BP: (!) 146/71 (!) 155/63 (!) 123/55 (!) 156/85  Pulse: 64 (!) 56    Resp: 16 16 17 18   Temp: 97.8 F (36.6 C) 98 F (36.7 C) 98 F (36.7 C) 98.5 F (36.9 C)  TempSrc: Oral Oral Oral Oral  SpO2: 90% 99%  90%  Weight:  110.6 kg  110.8 kg  Height:        Intake/Output Summary (Last 24 hours) at 02/07/2018 1208 Last data filed at 02/07/2018 1030 Gross per 24 hour  Intake 240 ml  Output 1400 ml  Net -1160 ml   Filed Weights   02/05/18 0534 02/06/18 0500 02/07/18 0500  Weight: 108.8 kg 110.6 kg 110.8 kg    Telemetry    Not on tele - Personally Reviewed  ECG    No new EKG to review - Personally Reviewed  Physical Exam   GEN: No acute distress.   Neck: No JVD Cardiac: irregularly irregular, no murmurs, rubs, or gallops.  Respiratory: Clear to auscultation bilaterally. GI: Soft, nontender, non-distended  MS: mild LE edema; No deformity. Neuro:  Nonfocal  Psych: Normal affect   Labs    Chemistry Recent Labs  Lab 02/03/18 0329 02/05/18 0336 02/06/18 0740  NA 137 134* 136  K 3.5 4.3 4.6  CL 95* 93* 94*  CO2 30 31 31   GLUCOSE 138* 137* 112*  BUN 12 28* 25*  CREATININE 1.06* 1.22* 1.01*  CALCIUM 8.7* 8.4* 8.7*  PROT   --  6.8  --   ALBUMIN  --  2.6*  --   AST  --  24  --   ALT  --  16  --   ALKPHOS  --  42  --   BILITOT  --  1.3*  --   GFRNONAA 48* 40* 50*  GFRAA 55* 46* 58*  ANIONGAP 12 10 11      HematologyNo results for input(s): WBC, RBC, HGB, HCT, MCV, MCH, MCHC, RDW, PLT in the last 168 hours.  Cardiac EnzymesNo results for input(s): TROPONINI in the last 168 hours. No results for input(s): TROPIPOC in the last 168 hours.   BNPNo results for input(s): BNP, PROBNP in the last 168 hours.   DDimer No results for input(s): DDIMER in the last 168 hours.   Radiology    No results found.  Cardiac Studies   Echo 01/31/18 Study Conclusions  - Left ventricle: The cavity size was normal. Wall thickness was increased in a pattern of moderate LVH. Systolic function was normal. The estimated ejection fraction was in the range of 60% to 65%. Wall motion was normal; there were no regional wall motion abnormalities. - Mitral valve: Severely calcified annulus. - Left atrium:  The atrium was severely dilated. - Right atrium: The atrium was mildly dilated. - Pulmonary arteries: Systolic pressure was mildly increased. PA peak pressure: 44 mm Hg (S). - Pericardium, extracardiac: A small pericardial effusion was identified.  Impressions:  - Normal LV systolic function; moderate LVH; biatrial enlargement; trace TR with mild pulmonary hypertension; small pericardial effusion.  Patient Profile     84 y.o. female hx of Persistent atrial fibrillation on Eliquis, St Jude PPM in situ, HTN, carotid artery disease, chronic diastolic CHF, non-compliance with medication admitted from clinic for IV diuresis.  Assessment & Plan    1. Lower extremity edema/ Acute on chronic diastolic CHFw/ wound and cellulitis -- Admitted from clinic for IV diuresis in setting of non compliance.  Echo showed LVEF of 60-65%, no WM abnormality. BNP 193. -This would suggest that the edema is probably not  cardiac in nature. More related to venous stasis.  She put out 800cc yesterday and is net neg 13.1L.  Weight down 14lb (267>>>243), and up 1 lb since yesterday. Very closely to euvolemic. Breathing has been improved.   Low Albumin. Needs to increase appetite.   Renal function stable and creatinine continues to improve (1.01 today)  Continue Lasix 80mg  daily  Plan to complete 10 days of abx for cellulitis  2. Persistent atrial fibrillation  Rate controlled without beta-blocker or calcium channel blocker.   Now back on Eliquis.  3.Episodic dyspnea Resolved with diuresis. Oxygen saturation now stable off of O2.   4. Deconditioning - PT/OT recommended SNF. Waiting placement.   -awaiting approval - she is ready for DC when bed available  I have spent a total of 35 minutes with patient reviewing 2D echo , telemetry, EKGs, labs and examining patient as well as establishing an assessment and plan that was discussed with the patient.  > 50% of time was spent in direct patient care.    For questions or updates, please contact [facility] Please consult [email or web address] for contact info under Cardiology/STEMI.      Signed, , MD  02/07/2018, 12:08 PM

## 2018-02-08 DIAGNOSIS — L03818 Cellulitis of other sites: Secondary | ICD-10-CM

## 2018-02-08 NOTE — Progress Notes (Signed)
Progress Note  Patient Name: Katherine Cervantes Date of Encounter: 02/08/2018  Primary Cardiologist: Olga Millers, MD   Subjective   Denies any chest pain or shortness of breath.  Inpatient Medications    Scheduled Meds: . apixaban  5 mg Oral BID  . atorvastatin  40 mg Oral q1800  . furosemide  80 mg Oral Daily  . potassium chloride  40 mEq Oral Daily  . sodium chloride flush  3 mL Intravenous Q12H   Continuous Infusions: . sodium chloride     PRN Meds: sodium chloride, acetaminophen, ALPRAZolam, nitroGLYCERIN, ondansetron (ZOFRAN) IV, sodium chloride flush, traMADol-acetaminophen, zolpidem   Vital Signs    Vitals:   02/07/18 0500 02/07/18 1308 02/07/18 2051 02/08/18 0525  BP: (!) 156/85 (!) 146/51 (!) 126/57 (!) 148/58  Pulse:  (!) 59 74 (!) 59  Resp: 18  18 18   Temp: 98.5 F (36.9 C) 98.4 F (36.9 C) 98.8 F (37.1 C) 98 F (36.7 C)  TempSrc: Oral Oral Oral Oral  SpO2: 90% (!) 88% 93% 90%  Weight: 110.8 kg   111.2 kg  Height:        Intake/Output Summary (Last 24 hours) at 02/08/2018 1026 Last data filed at 02/08/2018 02/10/2018 Gross per 24 hour  Intake 220 ml  Output 1850 ml  Net -1630 ml   Filed Weights   02/06/18 0500 02/07/18 0500 02/08/18 0525  Weight: 110.6 kg 110.8 kg 111.2 kg    Telemetry    Not on tele - Personally Reviewed  ECG    No new EKG to review- personally Reviewed  Physical Exam   GEN: Well nourished, well developed in no acute distress HEENT: Normal NECK: No JVD; No carotid bruits LYMPHATICS: No lymphadenopathy CARDIAC:RRR, no murmurs, rubs, gallops RESPIRATORY:  Clear to auscultation without rales, wheezing or rhonchi  ABDOMEN: Soft, non-tender, non-distended MUSCULOSKELETAL:  trace edema; No deformity  SKIN: Warm and dry NEUROLOGIC:  Alert and oriented x 3 PSYCHIATRIC:  Normal affect    Labs    Chemistry Recent Labs  Lab 02/03/18 0329 02/05/18 0336 02/06/18 0740  NA 137 134* 136  K 3.5 4.3 4.6  CL 95* 93*  94*  CO2 30 31 31   GLUCOSE 138* 137* 112*  BUN 12 28* 25*  CREATININE 1.06* 1.22* 1.01*  CALCIUM 8.7* 8.4* 8.7*  PROT  --  6.8  --   ALBUMIN  --  2.6*  --   AST  --  24  --   ALT  --  16  --   ALKPHOS  --  42  --   BILITOT  --  1.3*  --   GFRNONAA 48* 40* 50*  GFRAA 55* 46* 58*  ANIONGAP 12 10 11      HematologyNo results for input(s): WBC, RBC, HGB, HCT, MCV, MCH, MCHC, RDW, PLT in the last 168 hours.  Cardiac EnzymesNo results for input(s): TROPONINI in the last 168 hours. No results for input(s): TROPIPOC in the last 168 hours.   BNPNo results for input(s): BNP, PROBNP in the last 168 hours.   DDimer No results for input(s): DDIMER in the last 168 hours.   Radiology    No results found.  Cardiac Studies   Echo 01/31/18 Study Conclusions  - Left ventricle: The cavity size was normal. Wall thickness was increased in a pattern of moderate LVH. Systolic function was normal. The estimated ejection fraction was in the range of 60% to 65%. Wall motion was normal; there were no regional wall  motion abnormalities. - Mitral valve: Severely calcified annulus. - Left atrium: The atrium was severely dilated. - Right atrium: The atrium was mildly dilated. - Pulmonary arteries: Systolic pressure was mildly increased. PA peak pressure: 44 mm Hg (S). - Pericardium, extracardiac: A small pericardial effusion was identified.  Impressions:  - Normal LV systolic function; moderate LVH; biatrial enlargement; trace TR with mild pulmonary hypertension; small pericardial effusion.  Patient Profile     84 y.o. female hx of Persistent atrial fibrillation on Eliquis, St Jude PPM in situ, HTN, carotid artery disease, chronic diastolic CHF, non-compliance with medication admitted from clinic for IV diuresis.  Assessment & Plan    1. Lower extremity edema/ Acute on chronic diastolic CHFw/ wound and cellulitis -- Admitted from clinic for IV diuresis in setting of  non compliance. - Echo showed LVEF of 60-65%, no WM abnormality. BNP 193. -This would suggest that the edema is probably not cardiac in nature. More related to venous stasis. -She put out 1.85 L yesterday and is net neg 14.1 L.   -Weight down 22 lb (267>>>245), and up 1 lb since yesterday.  - Appears euvolemic on exam - Low Albumin. Needs to increase appetite.  - Check bmet today - Continue Lasix 80mg  daily - Plan to complete 10 days of abx for cellulitis  2. Persistent atrial fibrillation - Rate controlled without beta-blocker or calcium channel blocker.  - Now back on Eliquis.  3.Episodic dyspnea - Resolved with diuresis. Oxygen saturation now stable off of O2.   4. Deconditioning - PT/OT recommended SNF. Waiting placement.   - awaiting approval - she is ready for DC when bed available   For questions or updates, please contact [facility] Please consult [email or web address] for contact info under Cardiology/STEMI.      Signed, , MD  02/08/2018, 10:26 AM

## 2018-02-08 NOTE — Progress Notes (Signed)
CSW received a call from Ilona Sorrel of [facility] with [facility] who stated pt does not meet criteria for acute inpatient rehab which is different from a SNF, per Aram Beecham.    CSW reviewed chart and notes it looks as if pt has been accepted to [facility] and is awaiting an insurance auth at this time.  Please reconsult if future social work needs arise.  CSW signing off, as social work intervention is no longer needed.  Dorothe Pea. Arieal Cuoco, LCSW, LCAS, CSI Clinical Social Worker Ph: [phone]

## 2018-02-09 DIAGNOSIS — L03116 Cellulitis of left lower limb: Secondary | ICD-10-CM | POA: Diagnosis not present

## 2018-02-09 DIAGNOSIS — I4892 Unspecified atrial flutter: Secondary | ICD-10-CM | POA: Diagnosis not present

## 2018-02-09 DIAGNOSIS — I110 Hypertensive heart disease with heart failure: Secondary | ICD-10-CM | POA: Diagnosis not present

## 2018-02-09 DIAGNOSIS — I779 Disorder of arteries and arterioles, unspecified: Secondary | ICD-10-CM | POA: Diagnosis not present

## 2018-02-09 DIAGNOSIS — M2550 Pain in unspecified joint: Secondary | ICD-10-CM | POA: Diagnosis not present

## 2018-02-09 DIAGNOSIS — J45909 Unspecified asthma, uncomplicated: Secondary | ICD-10-CM | POA: Diagnosis not present

## 2018-02-09 DIAGNOSIS — R531 Weakness: Secondary | ICD-10-CM | POA: Diagnosis not present

## 2018-02-09 DIAGNOSIS — Z950 Presence of cardiac pacemaker: Secondary | ICD-10-CM | POA: Diagnosis not present

## 2018-02-09 DIAGNOSIS — E785 Hyperlipidemia, unspecified: Secondary | ICD-10-CM | POA: Diagnosis not present

## 2018-02-09 DIAGNOSIS — I5033 Acute on chronic diastolic (congestive) heart failure: Secondary | ICD-10-CM | POA: Diagnosis not present

## 2018-02-09 DIAGNOSIS — E119 Type 2 diabetes mellitus without complications: Secondary | ICD-10-CM | POA: Diagnosis not present

## 2018-02-09 DIAGNOSIS — I2510 Atherosclerotic heart disease of native coronary artery without angina pectoris: Secondary | ICD-10-CM | POA: Diagnosis not present

## 2018-02-09 DIAGNOSIS — I482 Chronic atrial fibrillation, unspecified: Secondary | ICD-10-CM | POA: Diagnosis not present

## 2018-02-09 DIAGNOSIS — L03115 Cellulitis of right lower limb: Secondary | ICD-10-CM | POA: Diagnosis not present

## 2018-02-09 DIAGNOSIS — I4819 Other persistent atrial fibrillation: Secondary | ICD-10-CM | POA: Diagnosis not present

## 2018-02-09 DIAGNOSIS — I509 Heart failure, unspecified: Secondary | ICD-10-CM | POA: Diagnosis not present

## 2018-02-09 DIAGNOSIS — I5032 Chronic diastolic (congestive) heart failure: Secondary | ICD-10-CM | POA: Diagnosis not present

## 2018-02-09 DIAGNOSIS — Z79899 Other long term (current) drug therapy: Secondary | ICD-10-CM | POA: Diagnosis not present

## 2018-02-09 DIAGNOSIS — Z7401 Bed confinement status: Secondary | ICD-10-CM | POA: Diagnosis not present

## 2018-02-09 DIAGNOSIS — I10 Essential (primary) hypertension: Secondary | ICD-10-CM | POA: Diagnosis not present

## 2018-02-09 DIAGNOSIS — R600 Localized edema: Secondary | ICD-10-CM | POA: Diagnosis not present

## 2018-02-09 DIAGNOSIS — Z7901 Long term (current) use of anticoagulants: Secondary | ICD-10-CM | POA: Diagnosis not present

## 2018-02-09 DIAGNOSIS — Z9114 Patient's other noncompliance with medication regimen: Secondary | ICD-10-CM | POA: Diagnosis not present

## 2018-02-09 DIAGNOSIS — M810 Age-related osteoporosis without current pathological fracture: Secondary | ICD-10-CM | POA: Diagnosis not present

## 2018-02-09 DIAGNOSIS — M069 Rheumatoid arthritis, unspecified: Secondary | ICD-10-CM | POA: Diagnosis not present

## 2018-02-09 DIAGNOSIS — I872 Venous insufficiency (chronic) (peripheral): Secondary | ICD-10-CM | POA: Diagnosis not present

## 2018-02-09 DIAGNOSIS — I890 Lymphedema, not elsewhere classified: Secondary | ICD-10-CM | POA: Diagnosis not present

## 2018-02-09 DIAGNOSIS — R609 Edema, unspecified: Secondary | ICD-10-CM | POA: Diagnosis not present

## 2018-02-09 LAB — BASIC METABOLIC PANEL
ANION GAP: 8 (ref 5–15)
BUN: 18 mg/dL (ref 8–23)
CO2: 31 mmol/L (ref 22–32)
Calcium: 8.4 mg/dL — ABNORMAL LOW (ref 8.9–10.3)
Chloride: 97 mmol/L — ABNORMAL LOW (ref 98–111)
Creatinine, Ser: 0.82 mg/dL (ref 0.44–1.00)
GFR calc Af Amer: >60 mL/min (ref >60)
GFR calc non Af Amer: >60 mL/min (ref >60)
Glucose, Bld: 137 mg/dL — ABNORMAL HIGH (ref 70–99)
Potassium: 3.8 mmol/L (ref 3.5–5.1)
Sodium: 136 mmol/L (ref 135–145)

## 2018-02-09 MED ORDER — FUROSEMIDE 40 MG PO TABS: 40.0000 mg | ORAL_TABLET | Freq: Every day | ORAL | Status: DC

## 2018-02-09 MED ORDER — AMLODIPINE BESYLATE 5 MG PO TABS
5.0000 mg | ORAL_TABLET | Freq: Every day | ORAL | Status: DC
  Administered 2018-02-09: 5 mg via ORAL
  Filled 2018-02-09: qty 1

## 2018-02-09 MED ORDER — POTASSIUM CHLORIDE CRYS ER 20 MEQ PO TBCR: 20.0000 meq | EXTENDED_RELEASE_TABLET | Freq: Every day | ORAL | Status: DC

## 2018-02-09 MED ORDER — FUROSEMIDE 40 MG PO TABS: ORAL_TABLET | ORAL | 2 refills | Status: DC

## 2018-02-09 MED ORDER — POTASSIUM CHLORIDE CRYS ER 20 MEQ PO TBCR: 20.0000 meq | EXTENDED_RELEASE_TABLET | Freq: Every day | ORAL | 6 refills | Status: DC

## 2018-02-09 MED ORDER — AMLODIPINE BESYLATE 5 MG PO TABS: 5.0000 mg | ORAL_TABLET | Freq: Every day | ORAL | 3 refills | Status: DC

## 2018-02-09 MED ORDER — HYDRALAZINE HCL 20 MG/ML IJ SOLN
20.0000 mg | Freq: Once | INTRAMUSCULAR | Status: AC
  Administered 2018-02-09: 20 mg via INTRAVENOUS
  Filled 2018-02-09: qty 1

## 2018-02-09 NOTE — Progress Notes (Signed)
[facility] will receive patient's discharge information prior to leaving. RN called report to [facility]. Patient IV was removed.

## 2018-02-09 NOTE — Progress Notes (Addendum)
Progress Note  Patient Name: Katherine Cervantes Date of Encounter: 02/09/2018  Primary Cardiologist: Olga Millers, MD   Subjective   Feeling well. No chest pain, sob or palpitations. Waiting insurance approval for SNF.   Inpatient Medications    Scheduled Meds: . amLODipine  5 mg Oral Daily  . apixaban  5 mg Oral BID  . atorvastatin  40 mg Oral q1800  . [START ON 02/10/2018] furosemide  40 mg Oral Daily  . [START ON 02/10/2018] potassium chloride  20 mEq Oral Daily  . sodium chloride flush  3 mL Intravenous Q12H   Continuous Infusions: . sodium chloride     PRN Meds: sodium chloride, acetaminophen, ALPRAZolam, nitroGLYCERIN, ondansetron (ZOFRAN) IV, sodium chloride flush, traMADol-acetaminophen, zolpidem   Vital Signs    Vitals:   02/08/18 0525 02/08/18 1406 02/08/18 1917 02/09/18 0403  BP: (!) 148/58 (!) 155/53 (!) 162/71 (!) 171/96  Pulse: (!) 59 (!) 58 64 61  Resp: 18   16  Temp: 98 F (36.7 C) 98.5 F (36.9 C) 98.4 F (36.9 C) 98.8 F (37.1 C)  TempSrc: Oral Oral Oral Oral  SpO2: 90% 90% 94% 95%  Weight: 111.2 kg     Height:        Intake/Output Summary (Last 24 hours) at 02/09/2018 0954 Last data filed at 02/09/2018 0900 Gross per 24 hour  Intake 220 ml  Output 1176 ml  Net -956 ml   Filed Weights   02/06/18 0500 02/07/18 0500 02/08/18 0525  Weight: 110.6 kg 110.8 kg 111.2 kg    Telemetry    Off tele now  ECG    N/A  Physical Exam   GEN: No acute distress.   Neck: No JVD Cardiac: RRR, no murmurs, rubs, or gallops.  Respiratory: Clear to auscultation bilaterally. GI: Soft, nontender, non-distended  MS: No edema; No deformity. ACE warp Neuro:  Nonfocal  Psych: Normal affect   Labs    Chemistry Recent Labs  Lab 02/05/18 0336 02/06/18 0740 02/09/18 0415  NA 134* 136 136  K 4.3 4.6 3.8  CL 93* 94* 97*  CO2 31 31 31   GLUCOSE 137* 112* 137*  BUN 28* 25* 18  CREATININE 1.22* 1.01* 0.82  CALCIUM 8.4* 8.7* 8.4*  PROT 6.8  --    --   ALBUMIN 2.6*  --   --   AST 24  --   --   ALT 16  --   --   ALKPHOS 42  --   --   BILITOT 1.3*  --   --   GFRNONAA 40* 50* >60  GFRAA 46* 58* >60  ANIONGAP 10 11 8       Radiology    No results found.  Cardiac Studies   Echo 01/31/18 Study Conclusions  - Left ventricle: The cavity size was normal. Wall thickness was increased in a pattern of moderate LVH. Systolic function was normal. The estimated ejection fraction was in the range of 60% to 65%. Wall motion was normal; there were no regional wall motion abnormalities. - Mitral valve: Severely calcified annulus. - Left atrium: The atrium was severely dilated. - Right atrium: The atrium was mildly dilated. - Pulmonary arteries: Systolic pressure was mildly increased. PA peak pressure: 44 mm Hg (S). - Pericardium, extracardiac: A small pericardial effusion was identified.  Impressions:  - Normal LV systolic function; moderate LVH; biatrial enlargement; trace TR with mild pulmonary hypertension; small pericardial effusion.  Patient Profile     84 y.o. female hx  of Persistent atrial fibrillation on Eliquis, St Jude PPM in situ, HTN, carotid artery disease, chronic diastolic CHF, non-compliance with medication admitted from clinic for IV diuresis.  Assessment & Plan    1. Lower extremity edema/ Acute on chronic diastolic CHFw/ wound and cellulitis -- Admitted from clinic for IV diuresis in setting of non compliance. - Echo showed LVEF of 60-65%, no WM abnormality. BNP 193. -This would suggest that the edema is probably not cardiac in nature. More related to venous stasis. -She put out 0.95L yesterday and is net neg 15 L.   -Weight down 22 lb (267>>>245), no weight done today.  - Appears euvolemic on exam with getting on dryer side. Will reduce lasix to 40mg  daily and Kdur to 20 meq. Low Albumin. Needs to increase appetite. - Plan to complete 10 days of abx for cellulitis  2. Persistent  atrial fibrillation - Rate controlled without any agent.  - Continue Eliquis.  3.Episodic dyspnea - Resolved with diuresis. Oxygen saturationnow stable off of O2.   4. Deconditioning - PT/OT recommended SNF.Waiting placement.   - awaiting approval - she is ready for DC when bed available   5. Elevated BP - Start Norvasc 5mg  daily   For questions or updates, please contact [facility] Please consult [email or web address] for contact info under        Signed , PA  02/09/2018, 9:54 AM     Patient seen and examined. Agree with assessment and plan. Feels better. No chest pain or dyspnea. Agree with adding norvasc for elevated BP. Awaiting SNF placement. No overt pitting edema; legs partially wrapped.    Manson Passey, MD, [facility] 02/09/2018 11:53 AM

## 2018-02-09 NOTE — Social Work (Signed)
Patient will discharge to [facility]. Anticipated discharge date: 02/09/18 Family notified: Zyrah Wiswell, daughter Transportation by: [facility]  Nurse to call report to [phone].  CSW signing off.  Abigail Butts, LCSWA  Clinical Social Worker

## 2018-02-09 NOTE — Discharge Summary (Addendum)
Discharge Summary    Patient ID: Haasini Patnaude MRN: [id number]; DOB: [birth year]  Admit date: 01/30/2018 Discharge date: 02/09/2018  Primary Care Provider: Sunnie Nielsen, DO  Primary Cardiologist: Olga Millers, MD   Discharge Diagnoses    Principal Problem:   Physical deconditioning Active Problems:   Atrial fibrillation (HCC)   Acute on chronic diastolic heart failure (HCC)   CHF (congestive heart failure) (HCC)   Venous insufficiency of both lower extremities   Bilateral lower extremity edema   Cellulitis   Elevated BP  Allergies Allergies  Allergen Reactions  . Aspirin Anaphylaxis  . Other Anaphylaxis  . Penicillins Anaphylaxis    Anaphylaxis  . Codeine Other (See Comments)    SOB Asthmatic issues  . Morphine And Related Other (See Comments)    Anaphylaxis  . Sulfa Antibiotics Rash  . Ether   . Levaquin [Levofloxacin In D5w] Other (See Comments)    Disoriented, Hallucinations  . Metoprolol     Possible  . Prednisone Other (See Comments)    Lethargic, rash  . Procaine     Diagnostic Studies/Procedures    Echo 01/31/18 Study Conclusions  - Left ventricle: The cavity size was normal. Wall thickness was increased in a pattern of moderate LVH. Systolic function was normal. The estimated ejection fraction was in the range of 60% to 65%. Wall motion was normal; there were no regional wall motion abnormalities. - Mitral valve: Severely calcified annulus. - Left atrium: The atrium was severely dilated. - Right atrium: The atrium was mildly dilated. - Pulmonary arteries: Systolic pressure was mildly increased. PA peak pressure: 44 mm Hg (S). - Pericardium, extracardiac: A small pericardial effusion was identified.  Impressions:  - Normal LV systolic function; moderate LVH; biatrial enlargement; trace TR with mild pulmonary hypertension; small pericardial effusion.   History of Present Illness     84 y.o.femalehx of  Persistent atrial fibrillation on Eliquis, St Jude PPM in situ, HTN, carotid artery disease, chronic diastolic CHF, non-compliance with medication admitted from clinic for IV diuresis.  Hospital Course     Consultants: None  1. Lower extremity edema/ Acute on chronic diastolic CHFw/ wound and cellulitis -- Admitted from clinic for IV diuresis in setting of non compliance. -Echo showed LVEF of 60-65%, no WM abnormality. BNP 193.  -This would suggest that the edema is probably not cardiac in nature. More related to venous stasis. Net net neg 15L.Weight down22lb (267>>>245). Completed total 10 days of antibiotics.  -Appeared euvolemic on exam with getting on dryer side at day of dischage. Reduce lasix to 40mg  daily and Kdur to 20 meq. Low Albumin. Needs to increase appetite.take additional lasix for weight gain or edema.    Heart Failure education: 1. Weigh yourself EVERY morning after you go to the bathroom but before you eat or drink anything. Write this number down in a weight log/diary. If you gain 3 pounds overnight or 5 pounds in a week, call the office. 2. Take your medicines as prescribed. If you have concerns about your medications, please call before you stop taking them.  3. Eat low salt foods-Limit salt (sodium) to 2000 mg per day. This will help prevent your body from holding onto fluid. Read food labels as many processed foods have a lot of sodium, especially canned goods and prepackaged meats. If you would like some assistance choosing low sodium foods, we would be happy to set you up with a nutritionist. 4. Stay as active as you can everyday. Staying active will  give you more energy and make your muscles stronger. Start with 5 minutes at a time and work your way up to 30 minutes a day. Break up your activities--do some in the morning and some in the afternoon. Start with 3 days per week and work your way up to 5 days as you can.  If you have chest pain, feel short of  breath, dizzy, or lightheaded, STOP. If you don't feel better after a short rest, call 911. If you do feel better, call the office to let us know you have symptoms with exercise. 5. Limit all fluids for the day to less than 2 liters. Fluid includes all drinks, coffee, juice, ice chips, soup, jello, and all other liquids.   2. Persistent atrial fibrillation -Rate controlled at 50-60s without any agent. would not add BB.  -Continue Eliquis. No bleeding issue.   3.Episodic dyspnea -Resolved with diuresis. Oxygen saturationnow stable, off of O2.   4. Deconditioning - PT/OT recommended SNF.   5. Elevated BP - Noted at day of discharge. Started Norvasc 5mg  daily, adjust as outpatient. Given one dose of IV hydralazine.   She has been seen by Dr. Tresa Endo today and deemed ready for discharge home. All follow-up appointments have been scheduled. Discharge medications are listed below.   Discharge Vitals Blood pressure (!) 184/75, pulse 61, temperature 98.8 F (37.1 C), temperature source Oral, resp. rate 16, height 5' (1.524 m), weight 111.2 kg, SpO2 95 %.  Filed Weights   02/06/18 0500 02/07/18 0500 02/08/18 0525  Weight: 110.6 kg 110.8 kg 111.2 kg    Labs & Radiologic Studies  Basic Metabolic Panel Recent Labs    77/93/90 0415  NA 136  K 3.8  CL 97*  CO2 31  GLUCOSE 137*  BUN 18  CREATININE 0.82  CALCIUM 8.4*   Disposition   Pt is being discharged home today in good condition.  Follow-up Plans & Appointments     Contact information for follow-up providers    Abelino Derrick, PA-C. Go on 02/23/2018.   Specialties:  Cardiology, Radiology Why:  @2pm  for hospital follow up. Please arrive 15 minutes early  Contact information: [address] [place] [address] [phone]            Contact information for after-discharge care    Destination    [facility] .   Service:  Skilled Nursing Contact information: [address] [place] [address] [phone]                 Discharge Instructions    Diet - low sodium heart healthy   Complete by:  As directed    Increase activity slowly   Complete by:  As directed       Discharge Medications   Allergies as of 02/09/2018      Reactions   Aspirin Anaphylaxis   Other Anaphylaxis   Penicillins Anaphylaxis   Anaphylaxis   Codeine Other (See Comments)   SOB Asthmatic issues   Morphine And Related Other (See Comments)   Anaphylaxis   Sulfa Antibiotics Rash   Ether    Levaquin [levofloxacin In D5w] Other (See Comments)   Disoriented, Hallucinations   Metoprolol    Possible   Prednisone Other (See Comments)   Lethargic, rash   Procaine       Medication List    STOP taking these medications   clindamycin 150 MG capsule Commonly known as:  CLEOCIN  TAKE these medications   amLODipine 5 MG tablet Commonly known as:  NORVASC Take 1 tablet (5 mg total) by mouth daily. Start taking on:  February 10, 2018   apixaban 5 MG Tabs tablet Commonly known as:  ELIQUIS Take 1 tablet (5 mg total) by mouth 2 (two) times daily.   furosemide 40 MG tablet Commonly known as:  LASIX Take 1 tablet (40mg ) by mouth daily. May take additional 0.5 table (20mg ) for dyspnea or LE edema. What changed:    how much to take  how to take this  when to take this  additional instructions   nitroGLYCERIN 0.4 MG SL tablet Commonly known as:  NITROSTAT Place 1 tablet (0.4 mg total) under the tongue every 5 (five) minutes as needed for chest pain (MAX 3 TABLETS).   potassium chloride SA 20 MEQ tablet Commonly known as:  K-DUR,KLOR-CON Take 1 tablet (20 mEq total) by mouth daily. Start taking on:  February 10, 2018   traMADol-acetaminophen 37.5-325 MG tablet Commonly known as:  ULTRACET TAKE ONE TABLET BY MOUTH EVERY 8 HOURS AS NEEDED FOR ARTHRITIS PAIN What changed:  See the new instructions.       Acute coronary  syndrome (MI, NSTEMI, STEMI, etc) this admission?: No.    Outstanding Labs/Studies   BMET at follow up  Duration of Discharge Encounter   Greater than 30 minutes including physician time.  Signed, February 12, 2018, PA 02/09/2018, 1:32 PM

## 2018-02-09 NOTE — Social Work (Addendum)
12:47 pm [facility] has [facility] approval for patient. Paged PA. CSW to support with discharge to SNF.  11:12 am Continue to await [facility] authorization with [facility]. Updated patient's daughter, [name]. CSW to follow.  Abigail Butts, LCSW [phone]

## 2018-02-09 NOTE — Clinical Social Work Placement (Signed)
   CLINICAL SOCIAL WORK PLACEMENT  NOTE  Date:  02/09/2018  Patient Details  Name: Katherine Cervantes MRN: [id number] Date of Birth: [birth year]  Clinical Social Work is seeking post-discharge placement for this patient at the Skilled  Nursing Facility level of care (*CSW will initial, date and re-position this form in  chart as items are completed):  Yes   Patient/family provided with [place] Clinical Social Work Department's list of facilities offering this level of care within the geographic area requested by the patient (or if unable, by the patient's family).  Yes   Patient/family informed of their freedom to choose among providers that offer the needed level of care, that participate in Medicare, Medicaid or managed care program needed by the patient, have an available bed and are willing to accept the patient.  Yes   Patient/family informed of [facility]'s ownership interest in [facility] and [facility], as well as of the fact that they are under no obligation to receive care at these facilities.  PASRR submitted to EDS on 02/05/18     PASRR number received on 02/05/18     Existing PASRR number confirmed on       FL2 transmitted to all facilities in geographic area requested by pt/family on 02/05/18     FL2 transmitted to all facilities within larger geographic area on       Patient informed that his/her managed care company has contracts with or will negotiate with certain facilities, including the following:  Other - please specify in the comment section below:([facility])     Yes   Patient/family informed of bed offers received.  Patient chooses bed at Other - please specify in the comment section below:([facility])     Physician recommends and patient chooses bed at      Patient to be transferred to Other - please specify in the comment section below:([facility]) on 02/09/18.  Patient to be transferred to facility by [facility]     Patient family notified  on 02/09/18 of transfer.  Name of family member notified:  Lorie Cleckley, daughter     PHYSICIAN Please prepare priority discharge summary, including medications, Please prepare prescriptions     Additional Comment:    _______________________________________________ Abigail Butts, LCSW 02/09/2018, 12:49 PM

## 2018-02-10 ENCOUNTER — Other Ambulatory Visit: Payer: Self-pay

## 2018-02-12 DIAGNOSIS — L03116 Cellulitis of left lower limb: Secondary | ICD-10-CM | POA: Diagnosis not present

## 2018-02-12 DIAGNOSIS — I872 Venous insufficiency (chronic) (peripheral): Secondary | ICD-10-CM | POA: Diagnosis not present

## 2018-02-12 DIAGNOSIS — L03115 Cellulitis of right lower limb: Secondary | ICD-10-CM | POA: Diagnosis not present

## 2018-02-12 DIAGNOSIS — I5033 Acute on chronic diastolic (congestive) heart failure: Secondary | ICD-10-CM | POA: Diagnosis not present

## 2018-02-16 DIAGNOSIS — I509 Heart failure, unspecified: Secondary | ICD-10-CM | POA: Diagnosis not present

## 2018-02-16 DIAGNOSIS — I890 Lymphedema, not elsewhere classified: Secondary | ICD-10-CM | POA: Diagnosis not present

## 2018-02-16 DIAGNOSIS — L03115 Cellulitis of right lower limb: Secondary | ICD-10-CM | POA: Diagnosis not present

## 2018-02-16 DIAGNOSIS — L03116 Cellulitis of left lower limb: Secondary | ICD-10-CM | POA: Diagnosis not present

## 2018-02-23 ENCOUNTER — Ambulatory Visit: Payer: Medicare Other | Admitting: Cardiology

## 2018-02-23 ENCOUNTER — Encounter: Payer: Self-pay | Admitting: Cardiology

## 2018-02-23 VITALS — BP 124/80 | HR 68 | Ht 60.0 in | Wt 240.0 lb

## 2018-02-23 DIAGNOSIS — I10 Essential (primary) hypertension: Secondary | ICD-10-CM

## 2018-02-23 DIAGNOSIS — R600 Localized edema: Secondary | ICD-10-CM | POA: Diagnosis not present

## 2018-02-23 DIAGNOSIS — Z7901 Long term (current) use of anticoagulants: Secondary | ICD-10-CM

## 2018-02-23 DIAGNOSIS — I482 Chronic atrial fibrillation, unspecified: Secondary | ICD-10-CM | POA: Diagnosis not present

## 2018-02-23 DIAGNOSIS — I872 Venous insufficiency (chronic) (peripheral): Secondary | ICD-10-CM | POA: Diagnosis not present

## 2018-02-23 DIAGNOSIS — L03116 Cellulitis of left lower limb: Secondary | ICD-10-CM | POA: Diagnosis not present

## 2018-02-23 DIAGNOSIS — Z6841 Body Mass Index (BMI) 40.0 and over, adult: Secondary | ICD-10-CM

## 2018-02-23 DIAGNOSIS — L03115 Cellulitis of right lower limb: Secondary | ICD-10-CM | POA: Diagnosis not present

## 2018-02-23 DIAGNOSIS — Z79899 Other long term (current) drug therapy: Secondary | ICD-10-CM

## 2018-02-23 DIAGNOSIS — I890 Lymphedema, not elsewhere classified: Secondary | ICD-10-CM | POA: Diagnosis not present

## 2018-02-23 DIAGNOSIS — Z950 Presence of cardiac pacemaker: Secondary | ICD-10-CM | POA: Diagnosis not present

## 2018-02-23 DIAGNOSIS — I5033 Acute on chronic diastolic (congestive) heart failure: Secondary | ICD-10-CM

## 2018-02-23 NOTE — Progress Notes (Signed)
02/23/2018 Katherine Cervantes   1929-05-12  [id number]  Primary Physician Katherine Nielsen, DO Primary Cardiologist: Dr Katherine Cervantes  HPI:  Katherine Cervantes is a pleasant 85 y/o female who has been followed by Dr Katherine Cervantes and Dr Katherine Cervantes. She had been followed in [place] till 2016.  She has a Charity fundraiser and had a low risk Myoview in 2011 in [place]. Her last echo in Aug 2018 showed preserved LVF.  The pt is a widow, she lives alone in her own home.  Her husband of 50 years passed in 2002.  She has seven children, several still in this area.  In Sept 2019 she presented to the ED with LE edema and cellulitis.  She had stopped all her medication prior to this. She had been active when younger but admits "I've lost the will to do anything".  She was placed on Lasix, antibiotics, and Eliquis. She was seen in f/u in Oct 2019 and was doing better on medications.  She is seen now as a post hospital visit. She was admitted 01/30/18 in CHF after she stopped all her medications.  She was diuresed about 25 pounds.  Echocardiogram shows preserved LV function with moderate LVH.  She was actually discharged to a skilled facility.  Today in the office she tells me that she just did not want to go on.  There is a lot of social dynamics going on here between the patient and her daughter in her home living situation.  Patient did see a psychiatrist in the nursing facility.  I had a long talk with the patient's daughter, Katherine Cervantes, and the patient.  I explained from a cardiac standpoint she is actually doing pretty well.  Her renal function is normal her heart function is normal she should be okay as long she can take her medications.   Current Outpatient Medications  Medication Sig Dispense Refill  . amLODipine (NORVASC) 5 MG tablet Take 1 tablet (5 mg total) by mouth daily. 30 tablet 3  . apixaban (ELIQUIS) 5 MG TABS tablet Take 1 tablet (5 mg total) by mouth 2 (two) times daily. 60 tablet 6  . furosemide (LASIX) 40  MG tablet Take 1 tablet (40mg ) by mouth daily. May take additional 0.5 table (20mg ) for dyspnea or LE edema. 30 tablet 2  . nitroGLYCERIN (NITROSTAT) 0.4 MG SL tablet Place 1 tablet (0.4 mg total) under the tongue every 5 (five) minutes as needed for chest pain (MAX 3 TABLETS). 25 tablet 3  . potassium chloride SA (K-DUR,KLOR-CON) 20 MEQ tablet Take 1 tablet (20 mEq total) by mouth daily. 30 tablet 6  . traMADol-acetaminophen (ULTRACET) 37.5-325 MG tablet TAKE ONE TABLET BY MOUTH EVERY 8 HOURS AS NEEDED FOR ARTHRITIS PAIN (Patient taking differently: Take 1-2 tablets by mouth every 8 (eight) hours as needed. ) 45 tablet 0   No current facility-administered medications for this visit.     Allergies  Allergen Reactions  . Aspirin Anaphylaxis  . Other Anaphylaxis  . Penicillins Anaphylaxis    Anaphylaxis  . Codeine Other (See Comments)    SOB Asthmatic issues  . Morphine And Related Other (See Comments)    Anaphylaxis  . Sulfa Antibiotics Rash  . Ether   . Levaquin [Levofloxacin In D5w] Other (See Comments)    Disoriented, Hallucinations  . Metoprolol     Possible  . Prednisone Other (See Comments)    Lethargic, rash  . Procaine     Past Medical History:  Diagnosis Date  .  Asthma   . Atrial fibrillation (HCC) 09/27/2011  . CAD (coronary artery disease) 09/27/2011  . Diabetes mellitus without complication (HCC)   . Dyslipidemia 09/27/2011  . History of vasculitis 09/27/2011  . Hypertension 09/27/2011  . Osteoporosis 09/27/2011  . Pacemaker   . PUD (peptic ulcer disease)   . Rheumatoid arthritis(714.0) 09/27/2011    Social History   Socioeconomic History  . Marital status: Widowed    Spouse name: Not on file  . Number of children: 7  . Years of education: Not on file  . Highest education level: Not on file  Occupational History  . Not on file  Social Needs  . Financial resource strain: Not on file  . Food insecurity:    Worry: Not on file    Inability: Not on file  .  Transportation needs:    Medical: Not on file    Non-medical: Not on file  Tobacco Use  . Smoking status: Never Smoker  . Smokeless tobacco: Never Used  Substance and Sexual Activity  . Alcohol use: Yes    Alcohol/week: 0.0 standard drinks    Comment: Occasional  . Drug use: No  . Sexual activity: Not on file  Lifestyle  . Physical activity:    Days per week: Not on file    Minutes per session: Not on file  . Stress: Not on file  Relationships  . Social connections:    Talks on phone: Not on file    Gets together: Not on file    Attends religious service: Not on file    Active member of club or organization: Not on file    Attends meetings of clubs or organizations: Not on file    Relationship status: Not on file  . Intimate partner violence:    Fear of current or ex partner: Not on file    Emotionally abused: Not on file    Physically abused: Not on file    Forced sexual activity: Not on file  Other Topics Concern  . Not on file  Social History Narrative  . Not on file     Family History  Problem Relation Age of Onset  . Hypertension Mother   . Cancer Brother   . Cancer Brother      Review of Systems: General: negative for chills, fever, night sweats or weight changes.  Cardiovascular: negative for chest pain, dyspnea on exertion, edema, orthopnea, palpitations, paroxysmal nocturnal dyspnea or shortness of breath Dermatological: negative for rash Respiratory: negative for cough or wheezing Urologic: negative for hematuria Abdominal: negative for nausea, vomiting, diarrhea, bright red blood per rectum, melena, or hematemesis Neurologic: negative for visual changes, syncope, or dizziness All other systems reviewed and are otherwise negative except as noted above.    Blood pressure 124/80, pulse 68, height 5' (1.524 m), weight 240 lb (108.9 kg).  General appearance: alert, cooperative, no distress and moderately obese Lungs: clear to auscultation  bilaterally Heart: regular rate and rhythm Extremities: 1-2+ chronic LE edema Skin: warm and dry Neurologic: Grossly normal   ASSESSMENT AND PLAN:   Acute on chronic diastolic heart failure (HCC) Recurrent secondary to intentional non compliance- "I just don't have the will to go on" Discharged 02/09/18 to SNF after 24 lb diuresis   Bilateral lower extremity edema Chronic  History of cardiac pacemaker 2/2 [facility] H/O St Jude pacemaker-followed by Dr Katherine Cervantes  Morbid obesity with BMI of 40.0-44.9, adult (HCC) BMI 46.8  Hypertension Controlled  Chronic anticoagulation Eliquis  Atrial fibrillation,  chronic (HCC) Rate controlled   PLAN  Urged compliance- f/u in 3 months.   Corine ShelterLuke Fountain Derusha PA-C 02/23/2018 4:45 PM

## 2018-02-23 NOTE — Assessment & Plan Note (Signed)
Eliquis 

## 2018-02-23 NOTE — Patient Instructions (Signed)
Medication Instructions:  Your Physician recommend you continue on your current medication as directed.    If you need a refill on your cardiac medications before your next appointment, please call your pharmacy.   Lab work: Your physician recommends that you return for lab work today (BMP)   If you have labs (blood work) drawn today and your tests are completely normal, you will receive your results only by: Marland Kitchen MyChart Message (if you have MyChart) OR . A paper copy in the mail If you have any lab test that is abnormal or we need to change your treatment, we will call you to review the results.  Testing/Procedures: None  Follow-Up: At [facility], you and your health needs are our priority.  As part of our continuing mission to provide you with exceptional heart care, we have created designated Provider Care Teams.  These Care Teams include your primary Cardiologist (physician) and Advanced Practice Providers (APPs -  Physician Assistants and Nurse Practitioners) who all work together to provide you with the care you need, when you need it. You will need a follow up appointment in 3 months.  Please call our office 2 months in advance to schedule this appointment.  You may see Olga Millers, MD or one of the following Advanced Practice Providers on your designated Care Team:   Corine Shelter, PA-C Judy Pimple, [place] . Marjie Skiff, PA-C

## 2018-02-23 NOTE — Assessment & Plan Note (Signed)
Recurrent secondary to intentional non compliance- "I just don't have the will to go on" Discharged 02/09/18 to SNF after 24 lb diuresis

## 2018-02-23 NOTE — Assessment & Plan Note (Signed)
Controlled.  

## 2018-02-23 NOTE — Assessment & Plan Note (Signed)
Chronic. 

## 2018-02-23 NOTE — Assessment & Plan Note (Signed)
H/O St Jude pacemaker-followed by Dr Taylor 

## 2018-02-23 NOTE — Assessment & Plan Note (Signed)
BMI 46.8

## 2018-02-23 NOTE — Assessment & Plan Note (Signed)
Rate controlled 

## 2018-02-24 LAB — BASIC METABOLIC PANEL
BUN/Creatinine Ratio: 18 (ref 12–28)
BUN: 15 mg/dL (ref 8–27)
CO2: 25 mmol/L (ref 20–29)
Calcium: 9.4 mg/dL (ref 8.7–10.3)
Chloride: 98 mmol/L (ref 96–106)
Creatinine, Ser: 0.82 mg/dL (ref 0.57–1.00)
GFR calc Af Amer: 75 mL/min/{1.73_m2} (ref >59)
GFR calc non Af Amer: 65 mL/min/{1.73_m2} (ref >59)
Glucose: 78 mg/dL (ref 65–99)
Potassium: 3.7 mmol/L (ref 3.5–5.2)
Sodium: 141 mmol/L (ref 134–144)

## 2018-02-25 NOTE — Progress Notes (Signed)
Tried calling patient no answer nor a voicemail to leave message. Will try calling again.

## 2018-02-27 ENCOUNTER — Telehealth: Payer: Self-pay

## 2018-02-27 NOTE — Telephone Encounter (Signed)
Tried calling patient to give lab results no answer. Patient does not have an voicemail box setup

## 2018-03-03 DIAGNOSIS — I509 Heart failure, unspecified: Secondary | ICD-10-CM | POA: Diagnosis not present

## 2018-03-03 DIAGNOSIS — I890 Lymphedema, not elsewhere classified: Secondary | ICD-10-CM | POA: Diagnosis not present

## 2018-03-03 NOTE — Progress Notes (Signed)
Called and spoke with the patient's daughter Katherine Cervantes. Daughter was informed of her mother's lab results.She verbalized understanding and had no questions. She also informed me that her mother is in a rehab facility and doesn't have a telephone in her room to call her ([name]) line.

## 2018-03-09 DIAGNOSIS — I872 Venous insufficiency (chronic) (peripheral): Secondary | ICD-10-CM | POA: Diagnosis not present

## 2018-03-09 DIAGNOSIS — L03115 Cellulitis of right lower limb: Secondary | ICD-10-CM | POA: Diagnosis not present

## 2018-03-09 DIAGNOSIS — I5032 Chronic diastolic (congestive) heart failure: Secondary | ICD-10-CM | POA: Diagnosis not present

## 2018-03-09 DIAGNOSIS — I890 Lymphedema, not elsewhere classified: Secondary | ICD-10-CM | POA: Diagnosis not present

## 2018-03-09 DIAGNOSIS — I110 Hypertensive heart disease with heart failure: Secondary | ICD-10-CM | POA: Diagnosis not present

## 2018-03-09 DIAGNOSIS — L03116 Cellulitis of left lower limb: Secondary | ICD-10-CM | POA: Diagnosis not present

## 2018-03-09 DIAGNOSIS — R609 Edema, unspecified: Secondary | ICD-10-CM | POA: Diagnosis not present

## 2018-03-10 DIAGNOSIS — I5033 Acute on chronic diastolic (congestive) heart failure: Secondary | ICD-10-CM | POA: Diagnosis not present

## 2018-03-10 DIAGNOSIS — I2510 Atherosclerotic heart disease of native coronary artery without angina pectoris: Secondary | ICD-10-CM | POA: Diagnosis not present

## 2018-03-10 DIAGNOSIS — I872 Venous insufficiency (chronic) (peripheral): Secondary | ICD-10-CM | POA: Diagnosis not present

## 2018-03-10 DIAGNOSIS — I110 Hypertensive heart disease with heart failure: Secondary | ICD-10-CM | POA: Diagnosis not present

## 2018-03-10 DIAGNOSIS — E119 Type 2 diabetes mellitus without complications: Secondary | ICD-10-CM | POA: Diagnosis not present

## 2018-03-10 DIAGNOSIS — I4891 Unspecified atrial fibrillation: Secondary | ICD-10-CM | POA: Diagnosis not present

## 2018-03-12 DIAGNOSIS — E119 Type 2 diabetes mellitus without complications: Secondary | ICD-10-CM | POA: Diagnosis not present

## 2018-03-12 DIAGNOSIS — I872 Venous insufficiency (chronic) (peripheral): Secondary | ICD-10-CM | POA: Diagnosis not present

## 2018-03-12 DIAGNOSIS — I110 Hypertensive heart disease with heart failure: Secondary | ICD-10-CM | POA: Diagnosis not present

## 2018-03-12 DIAGNOSIS — I2510 Atherosclerotic heart disease of native coronary artery without angina pectoris: Secondary | ICD-10-CM | POA: Diagnosis not present

## 2018-03-12 DIAGNOSIS — I4891 Unspecified atrial fibrillation: Secondary | ICD-10-CM | POA: Diagnosis not present

## 2018-03-12 DIAGNOSIS — I5033 Acute on chronic diastolic (congestive) heart failure: Secondary | ICD-10-CM | POA: Diagnosis not present

## 2018-03-13 ENCOUNTER — Telehealth: Payer: Self-pay | Admitting: Cardiology

## 2018-03-13 NOTE — Telephone Encounter (Signed)
Will forward for dr [name] review and okay for referral and diagnosis.

## 2018-03-13 NOTE — Telephone Encounter (Signed)
  Patient states that when she was in the hospital it was mentioned that she needs to see a podiatrist. She would like a referral to one because she does not know who to see.

## 2018-03-13 NOTE — Telephone Encounter (Signed)
I do not know a podiatrist but ok to see Olga Millers

## 2018-03-13 NOTE — Telephone Encounter (Signed)
Spoke with pt, aware we do not know any podiatrist in the [place] area. She will ask her medical doctor for referral.

## 2018-03-16 DIAGNOSIS — E119 Type 2 diabetes mellitus without complications: Secondary | ICD-10-CM | POA: Diagnosis not present

## 2018-03-16 DIAGNOSIS — I872 Venous insufficiency (chronic) (peripheral): Secondary | ICD-10-CM | POA: Diagnosis not present

## 2018-03-16 DIAGNOSIS — I4891 Unspecified atrial fibrillation: Secondary | ICD-10-CM | POA: Diagnosis not present

## 2018-03-16 DIAGNOSIS — I2510 Atherosclerotic heart disease of native coronary artery without angina pectoris: Secondary | ICD-10-CM | POA: Diagnosis not present

## 2018-03-16 DIAGNOSIS — I5033 Acute on chronic diastolic (congestive) heart failure: Secondary | ICD-10-CM | POA: Diagnosis not present

## 2018-03-16 DIAGNOSIS — I110 Hypertensive heart disease with heart failure: Secondary | ICD-10-CM | POA: Diagnosis not present

## 2018-03-17 DIAGNOSIS — I5033 Acute on chronic diastolic (congestive) heart failure: Secondary | ICD-10-CM | POA: Diagnosis not present

## 2018-03-17 DIAGNOSIS — I4891 Unspecified atrial fibrillation: Secondary | ICD-10-CM | POA: Diagnosis not present

## 2018-03-17 DIAGNOSIS — I110 Hypertensive heart disease with heart failure: Secondary | ICD-10-CM | POA: Diagnosis not present

## 2018-03-17 DIAGNOSIS — I2510 Atherosclerotic heart disease of native coronary artery without angina pectoris: Secondary | ICD-10-CM | POA: Diagnosis not present

## 2018-03-17 DIAGNOSIS — I872 Venous insufficiency (chronic) (peripheral): Secondary | ICD-10-CM | POA: Diagnosis not present

## 2018-03-17 DIAGNOSIS — E119 Type 2 diabetes mellitus without complications: Secondary | ICD-10-CM | POA: Diagnosis not present

## 2018-03-18 DIAGNOSIS — I2510 Atherosclerotic heart disease of native coronary artery without angina pectoris: Secondary | ICD-10-CM | POA: Diagnosis not present

## 2018-03-18 DIAGNOSIS — I4891 Unspecified atrial fibrillation: Secondary | ICD-10-CM | POA: Diagnosis not present

## 2018-03-18 DIAGNOSIS — E119 Type 2 diabetes mellitus without complications: Secondary | ICD-10-CM | POA: Diagnosis not present

## 2018-03-18 DIAGNOSIS — I110 Hypertensive heart disease with heart failure: Secondary | ICD-10-CM | POA: Diagnosis not present

## 2018-03-18 DIAGNOSIS — I872 Venous insufficiency (chronic) (peripheral): Secondary | ICD-10-CM | POA: Diagnosis not present

## 2018-03-18 DIAGNOSIS — I5033 Acute on chronic diastolic (congestive) heart failure: Secondary | ICD-10-CM | POA: Diagnosis not present

## 2018-03-19 DIAGNOSIS — I4891 Unspecified atrial fibrillation: Secondary | ICD-10-CM | POA: Diagnosis not present

## 2018-03-19 DIAGNOSIS — I872 Venous insufficiency (chronic) (peripheral): Secondary | ICD-10-CM | POA: Diagnosis not present

## 2018-03-19 DIAGNOSIS — I110 Hypertensive heart disease with heart failure: Secondary | ICD-10-CM | POA: Diagnosis not present

## 2018-03-19 DIAGNOSIS — I5033 Acute on chronic diastolic (congestive) heart failure: Secondary | ICD-10-CM | POA: Diagnosis not present

## 2018-03-19 DIAGNOSIS — E119 Type 2 diabetes mellitus without complications: Secondary | ICD-10-CM | POA: Diagnosis not present

## 2018-03-19 DIAGNOSIS — I2510 Atherosclerotic heart disease of native coronary artery without angina pectoris: Secondary | ICD-10-CM | POA: Diagnosis not present

## 2018-03-20 DIAGNOSIS — I110 Hypertensive heart disease with heart failure: Secondary | ICD-10-CM | POA: Diagnosis not present

## 2018-03-20 DIAGNOSIS — E119 Type 2 diabetes mellitus without complications: Secondary | ICD-10-CM | POA: Diagnosis not present

## 2018-03-20 DIAGNOSIS — I4891 Unspecified atrial fibrillation: Secondary | ICD-10-CM | POA: Diagnosis not present

## 2018-03-20 DIAGNOSIS — I872 Venous insufficiency (chronic) (peripheral): Secondary | ICD-10-CM | POA: Diagnosis not present

## 2018-03-20 DIAGNOSIS — I2510 Atherosclerotic heart disease of native coronary artery without angina pectoris: Secondary | ICD-10-CM | POA: Diagnosis not present

## 2018-03-20 DIAGNOSIS — I5033 Acute on chronic diastolic (congestive) heart failure: Secondary | ICD-10-CM | POA: Diagnosis not present

## 2018-03-23 DIAGNOSIS — I872 Venous insufficiency (chronic) (peripheral): Secondary | ICD-10-CM | POA: Diagnosis not present

## 2018-03-23 DIAGNOSIS — I2510 Atherosclerotic heart disease of native coronary artery without angina pectoris: Secondary | ICD-10-CM | POA: Diagnosis not present

## 2018-03-23 DIAGNOSIS — I4891 Unspecified atrial fibrillation: Secondary | ICD-10-CM | POA: Diagnosis not present

## 2018-03-23 DIAGNOSIS — E119 Type 2 diabetes mellitus without complications: Secondary | ICD-10-CM | POA: Diagnosis not present

## 2018-03-23 DIAGNOSIS — I5033 Acute on chronic diastolic (congestive) heart failure: Secondary | ICD-10-CM | POA: Diagnosis not present

## 2018-03-23 DIAGNOSIS — I110 Hypertensive heart disease with heart failure: Secondary | ICD-10-CM | POA: Diagnosis not present

## 2018-03-24 DIAGNOSIS — I5033 Acute on chronic diastolic (congestive) heart failure: Secondary | ICD-10-CM | POA: Diagnosis not present

## 2018-03-24 DIAGNOSIS — E119 Type 2 diabetes mellitus without complications: Secondary | ICD-10-CM | POA: Diagnosis not present

## 2018-03-24 DIAGNOSIS — I872 Venous insufficiency (chronic) (peripheral): Secondary | ICD-10-CM | POA: Diagnosis not present

## 2018-03-24 DIAGNOSIS — I110 Hypertensive heart disease with heart failure: Secondary | ICD-10-CM | POA: Diagnosis not present

## 2018-03-24 DIAGNOSIS — I4891 Unspecified atrial fibrillation: Secondary | ICD-10-CM | POA: Diagnosis not present

## 2018-03-24 DIAGNOSIS — I2510 Atherosclerotic heart disease of native coronary artery without angina pectoris: Secondary | ICD-10-CM | POA: Diagnosis not present

## 2018-03-26 DIAGNOSIS — I5033 Acute on chronic diastolic (congestive) heart failure: Secondary | ICD-10-CM | POA: Diagnosis not present

## 2018-03-26 DIAGNOSIS — I4891 Unspecified atrial fibrillation: Secondary | ICD-10-CM | POA: Diagnosis not present

## 2018-03-26 DIAGNOSIS — I872 Venous insufficiency (chronic) (peripheral): Secondary | ICD-10-CM | POA: Diagnosis not present

## 2018-03-26 DIAGNOSIS — E119 Type 2 diabetes mellitus without complications: Secondary | ICD-10-CM | POA: Diagnosis not present

## 2018-03-26 DIAGNOSIS — I2510 Atherosclerotic heart disease of native coronary artery without angina pectoris: Secondary | ICD-10-CM | POA: Diagnosis not present

## 2018-03-26 DIAGNOSIS — I110 Hypertensive heart disease with heart failure: Secondary | ICD-10-CM | POA: Diagnosis not present

## 2018-03-27 DIAGNOSIS — I110 Hypertensive heart disease with heart failure: Secondary | ICD-10-CM | POA: Diagnosis not present

## 2018-03-27 DIAGNOSIS — E119 Type 2 diabetes mellitus without complications: Secondary | ICD-10-CM | POA: Diagnosis not present

## 2018-03-27 DIAGNOSIS — I5033 Acute on chronic diastolic (congestive) heart failure: Secondary | ICD-10-CM | POA: Diagnosis not present

## 2018-03-27 DIAGNOSIS — I2510 Atherosclerotic heart disease of native coronary artery without angina pectoris: Secondary | ICD-10-CM | POA: Diagnosis not present

## 2018-03-27 DIAGNOSIS — I4891 Unspecified atrial fibrillation: Secondary | ICD-10-CM | POA: Diagnosis not present

## 2018-03-27 DIAGNOSIS — I872 Venous insufficiency (chronic) (peripheral): Secondary | ICD-10-CM | POA: Diagnosis not present

## 2018-03-30 DIAGNOSIS — I2510 Atherosclerotic heart disease of native coronary artery without angina pectoris: Secondary | ICD-10-CM | POA: Diagnosis not present

## 2018-03-30 DIAGNOSIS — I110 Hypertensive heart disease with heart failure: Secondary | ICD-10-CM | POA: Diagnosis not present

## 2018-03-30 DIAGNOSIS — I872 Venous insufficiency (chronic) (peripheral): Secondary | ICD-10-CM | POA: Diagnosis not present

## 2018-03-30 DIAGNOSIS — I5033 Acute on chronic diastolic (congestive) heart failure: Secondary | ICD-10-CM | POA: Diagnosis not present

## 2018-03-30 DIAGNOSIS — I4891 Unspecified atrial fibrillation: Secondary | ICD-10-CM | POA: Diagnosis not present

## 2018-03-30 DIAGNOSIS — E119 Type 2 diabetes mellitus without complications: Secondary | ICD-10-CM | POA: Diagnosis not present

## 2018-03-31 DIAGNOSIS — I2510 Atherosclerotic heart disease of native coronary artery without angina pectoris: Secondary | ICD-10-CM | POA: Diagnosis not present

## 2018-03-31 DIAGNOSIS — I110 Hypertensive heart disease with heart failure: Secondary | ICD-10-CM | POA: Diagnosis not present

## 2018-03-31 DIAGNOSIS — E119 Type 2 diabetes mellitus without complications: Secondary | ICD-10-CM | POA: Diagnosis not present

## 2018-03-31 DIAGNOSIS — I4891 Unspecified atrial fibrillation: Secondary | ICD-10-CM | POA: Diagnosis not present

## 2018-03-31 DIAGNOSIS — I872 Venous insufficiency (chronic) (peripheral): Secondary | ICD-10-CM | POA: Diagnosis not present

## 2018-03-31 DIAGNOSIS — I5033 Acute on chronic diastolic (congestive) heart failure: Secondary | ICD-10-CM | POA: Diagnosis not present

## 2018-04-01 DIAGNOSIS — I4891 Unspecified atrial fibrillation: Secondary | ICD-10-CM | POA: Diagnosis not present

## 2018-04-01 DIAGNOSIS — I110 Hypertensive heart disease with heart failure: Secondary | ICD-10-CM | POA: Diagnosis not present

## 2018-04-01 DIAGNOSIS — I872 Venous insufficiency (chronic) (peripheral): Secondary | ICD-10-CM | POA: Diagnosis not present

## 2018-04-01 DIAGNOSIS — E119 Type 2 diabetes mellitus without complications: Secondary | ICD-10-CM | POA: Diagnosis not present

## 2018-04-01 DIAGNOSIS — I5033 Acute on chronic diastolic (congestive) heart failure: Secondary | ICD-10-CM | POA: Diagnosis not present

## 2018-04-01 DIAGNOSIS — I2510 Atherosclerotic heart disease of native coronary artery without angina pectoris: Secondary | ICD-10-CM | POA: Diagnosis not present

## 2018-04-02 DIAGNOSIS — E119 Type 2 diabetes mellitus without complications: Secondary | ICD-10-CM | POA: Diagnosis not present

## 2018-04-02 DIAGNOSIS — I5033 Acute on chronic diastolic (congestive) heart failure: Secondary | ICD-10-CM | POA: Diagnosis not present

## 2018-04-02 DIAGNOSIS — I2510 Atherosclerotic heart disease of native coronary artery without angina pectoris: Secondary | ICD-10-CM | POA: Diagnosis not present

## 2018-04-02 DIAGNOSIS — I872 Venous insufficiency (chronic) (peripheral): Secondary | ICD-10-CM | POA: Diagnosis not present

## 2018-04-02 DIAGNOSIS — I110 Hypertensive heart disease with heart failure: Secondary | ICD-10-CM | POA: Diagnosis not present

## 2018-04-02 DIAGNOSIS — I4891 Unspecified atrial fibrillation: Secondary | ICD-10-CM | POA: Diagnosis not present

## 2018-04-03 DIAGNOSIS — E119 Type 2 diabetes mellitus without complications: Secondary | ICD-10-CM | POA: Diagnosis not present

## 2018-04-03 DIAGNOSIS — I110 Hypertensive heart disease with heart failure: Secondary | ICD-10-CM | POA: Diagnosis not present

## 2018-04-03 DIAGNOSIS — I5033 Acute on chronic diastolic (congestive) heart failure: Secondary | ICD-10-CM | POA: Diagnosis not present

## 2018-04-03 DIAGNOSIS — I4891 Unspecified atrial fibrillation: Secondary | ICD-10-CM | POA: Diagnosis not present

## 2018-04-03 DIAGNOSIS — I2510 Atherosclerotic heart disease of native coronary artery without angina pectoris: Secondary | ICD-10-CM | POA: Diagnosis not present

## 2018-04-03 DIAGNOSIS — I872 Venous insufficiency (chronic) (peripheral): Secondary | ICD-10-CM | POA: Diagnosis not present

## 2018-04-06 DIAGNOSIS — I110 Hypertensive heart disease with heart failure: Secondary | ICD-10-CM | POA: Diagnosis not present

## 2018-04-06 DIAGNOSIS — I872 Venous insufficiency (chronic) (peripheral): Secondary | ICD-10-CM | POA: Diagnosis not present

## 2018-04-06 DIAGNOSIS — E119 Type 2 diabetes mellitus without complications: Secondary | ICD-10-CM | POA: Diagnosis not present

## 2018-04-06 DIAGNOSIS — I4891 Unspecified atrial fibrillation: Secondary | ICD-10-CM | POA: Diagnosis not present

## 2018-04-06 DIAGNOSIS — I2510 Atherosclerotic heart disease of native coronary artery without angina pectoris: Secondary | ICD-10-CM | POA: Diagnosis not present

## 2018-04-06 DIAGNOSIS — I5033 Acute on chronic diastolic (congestive) heart failure: Secondary | ICD-10-CM | POA: Diagnosis not present

## 2018-04-07 DIAGNOSIS — I872 Venous insufficiency (chronic) (peripheral): Secondary | ICD-10-CM | POA: Diagnosis not present

## 2018-04-07 DIAGNOSIS — I2510 Atherosclerotic heart disease of native coronary artery without angina pectoris: Secondary | ICD-10-CM | POA: Diagnosis not present

## 2018-04-07 DIAGNOSIS — E119 Type 2 diabetes mellitus without complications: Secondary | ICD-10-CM | POA: Diagnosis not present

## 2018-04-07 DIAGNOSIS — I4891 Unspecified atrial fibrillation: Secondary | ICD-10-CM | POA: Diagnosis not present

## 2018-04-07 DIAGNOSIS — I5033 Acute on chronic diastolic (congestive) heart failure: Secondary | ICD-10-CM | POA: Diagnosis not present

## 2018-04-07 DIAGNOSIS — I110 Hypertensive heart disease with heart failure: Secondary | ICD-10-CM | POA: Diagnosis not present

## 2018-04-09 DIAGNOSIS — I110 Hypertensive heart disease with heart failure: Secondary | ICD-10-CM | POA: Diagnosis not present

## 2018-04-09 DIAGNOSIS — I5032 Chronic diastolic (congestive) heart failure: Secondary | ICD-10-CM | POA: Diagnosis not present

## 2018-04-09 DIAGNOSIS — R609 Edema, unspecified: Secondary | ICD-10-CM | POA: Diagnosis not present

## 2018-05-08 DIAGNOSIS — R609 Edema, unspecified: Secondary | ICD-10-CM | POA: Diagnosis not present

## 2018-05-08 DIAGNOSIS — I110 Hypertensive heart disease with heart failure: Secondary | ICD-10-CM | POA: Diagnosis not present

## 2018-05-08 DIAGNOSIS — I5032 Chronic diastolic (congestive) heart failure: Secondary | ICD-10-CM | POA: Diagnosis not present

## 2018-05-11 ENCOUNTER — Telehealth: Payer: Self-pay | Admitting: Cardiology

## 2018-05-11 NOTE — Telephone Encounter (Signed)
Left message for pt to call.

## 2018-05-11 NOTE — Telephone Encounter (Addendum)
Spoke with pt dtr, she reports the patient is not doing well. Her feet and legs are swollen so bad they are weeping. She has gained back all the weight she lost during her hospitalization. She has not been taking her medicines correctly because she has trouble getting up and does not want to go to the bathroom that much. The dtr reports SOB. The dtr is not sure she can even get in the car to come to an appointment. Tried to contact the patient and the phone rings and then cuts off. Left message for the dtr not able to get in touch with the patient.

## 2018-05-11 NOTE — Telephone Encounter (Signed)
Patient's daughter is called today because her mother has appt with Dr. [name] next week, and she feels she really needs to keep it.  She states her mother legs are very swollen, and her legs are weeping, and she had gained weight, mother refused to get on scale and she is SOB.

## 2018-05-11 NOTE — Telephone Encounter (Signed)
° ° °  Please return call to patient °

## 2018-05-11 NOTE — Telephone Encounter (Signed)
Spoke with pt, she reports a lot of swelling, she has taken 2 fluid pills daily and is going to the bathroom but there has really been no change in the swelling or SOB. She feels like she is close to what she was when she was in the hospital in December. She does not feel like she needs to go to the hospital but feels she needs to be seen. Follow up scheduled with dr Duke Salvia DOD Wednesday this week. Patient voiced understanding to call EMS and go to the hospital if symptoms worsen or change.

## 2018-05-12 ENCOUNTER — Telehealth: Payer: Self-pay | Admitting: Cardiology

## 2018-05-12 NOTE — Telephone Encounter (Signed)
Spoke to patient and daughter .   The patient would like to try virtual appointment.  RN   Sent information on how To download Webex- [place].    DAUGHTER STATES  SHE WILL OBTAIN INFORMATION AND RN WILL CALL BACK@15  MIN TO SEE IF ANY QUESTION FOR UPCOMING APPOINTMENT

## 2018-05-12 NOTE — Telephone Encounter (Signed)
contacted patient and daughter -  Patient and daughter was not able to get to mychart -- need new password.  RN  Was able to daughter the daughter to dwnload webex - they have instruction what to do tomorrow appointment- will available btwn 1:30 -2:30  .

## 2018-05-12 NOTE — Telephone Encounter (Signed)
  Daughter is calling because she does not think they will be able to get her mother in the car. She would like to know if we can suggest who they can call to set up transport to get her to her appt.

## 2018-05-12 NOTE — Telephone Encounter (Signed)
See other telephone note  05/12/18

## 2018-05-12 NOTE — Telephone Encounter (Signed)
[email or web address]   (all lowercase)  If needs home health [facility]

## 2018-05-12 NOTE — Telephone Encounter (Signed)
New message   Patient is setup on 05/13/2018 to see Dr. Duke Salvia. The patient would like to know if this can be done through a virtual visit. The patient is having issues with her legs at this time and can't come in the office. Please call the patient to discuss.

## 2018-05-13 ENCOUNTER — Telehealth (INDEPENDENT_AMBULATORY_CARE_PROVIDER_SITE_OTHER): Payer: Medicare Other | Admitting: Cardiovascular Disease

## 2018-05-13 ENCOUNTER — Other Ambulatory Visit: Payer: Self-pay

## 2018-05-13 ENCOUNTER — Inpatient Hospital Stay (HOSPITAL_COMMUNITY)
Admission: AD | Admit: 2018-05-13 | Discharge: 2018-05-22 | DRG: 292 | Disposition: A | Discharge status: Skilled Nursing Facility | Payer: Medicare Other | Source: Ambulatory Visit | Attending: Cardiovascular Disease | Admitting: Cardiovascular Disease

## 2018-05-13 DIAGNOSIS — E876 Hypokalemia: Secondary | ICD-10-CM | POA: Diagnosis not present

## 2018-05-13 DIAGNOSIS — I4821 Permanent atrial fibrillation: Secondary | ICD-10-CM | POA: Diagnosis not present

## 2018-05-13 DIAGNOSIS — F329 Major depressive disorder, single episode, unspecified: Secondary | ICD-10-CM | POA: Diagnosis present

## 2018-05-13 DIAGNOSIS — I10 Essential (primary) hypertension: Secondary | ICD-10-CM | POA: Diagnosis not present

## 2018-05-13 DIAGNOSIS — J45909 Unspecified asthma, uncomplicated: Secondary | ICD-10-CM | POA: Diagnosis not present

## 2018-05-13 DIAGNOSIS — Z8711 Personal history of peptic ulcer disease: Secondary | ICD-10-CM

## 2018-05-13 DIAGNOSIS — Z950 Presence of cardiac pacemaker: Secondary | ICD-10-CM | POA: Diagnosis present

## 2018-05-13 DIAGNOSIS — I779 Disorder of arteries and arterioles, unspecified: Secondary | ICD-10-CM | POA: Diagnosis present

## 2018-05-13 DIAGNOSIS — L03115 Cellulitis of right lower limb: Secondary | ICD-10-CM

## 2018-05-13 DIAGNOSIS — I4819 Other persistent atrial fibrillation: Secondary | ICD-10-CM | POA: Diagnosis not present

## 2018-05-13 DIAGNOSIS — Y929 Unspecified place or not applicable: Secondary | ICD-10-CM

## 2018-05-13 DIAGNOSIS — Z713 Dietary counseling and surveillance: Secondary | ICD-10-CM | POA: Diagnosis not present

## 2018-05-13 DIAGNOSIS — L03114 Cellulitis of left upper limb: Secondary | ICD-10-CM | POA: Diagnosis not present

## 2018-05-13 DIAGNOSIS — Z888 Allergy status to other drugs, medicaments and biological substances status: Secondary | ICD-10-CM

## 2018-05-13 DIAGNOSIS — Z9114 Patient's other noncompliance with medication regimen: Secondary | ICD-10-CM | POA: Diagnosis not present

## 2018-05-13 DIAGNOSIS — M810 Age-related osteoporosis without current pathological fracture: Secondary | ICD-10-CM | POA: Diagnosis not present

## 2018-05-13 DIAGNOSIS — I110 Hypertensive heart disease with heart failure: Secondary | ICD-10-CM | POA: Diagnosis not present

## 2018-05-13 DIAGNOSIS — I313 Pericardial effusion (noninflammatory): Secondary | ICD-10-CM | POA: Diagnosis not present

## 2018-05-13 DIAGNOSIS — Z903 Acquired absence of stomach [part of]: Secondary | ICD-10-CM | POA: Diagnosis not present

## 2018-05-13 DIAGNOSIS — I2510 Atherosclerotic heart disease of native coronary artery without angina pectoris: Secondary | ICD-10-CM | POA: Diagnosis present

## 2018-05-13 DIAGNOSIS — R2681 Unsteadiness on feet: Secondary | ICD-10-CM | POA: Diagnosis not present

## 2018-05-13 DIAGNOSIS — T8089XA Other complications following infusion, transfusion and therapeutic injection, initial encounter: Secondary | ICD-10-CM | POA: Diagnosis not present

## 2018-05-13 DIAGNOSIS — T50996A Underdosing of other drugs, medicaments and biological substances, initial encounter: Secondary | ICD-10-CM | POA: Diagnosis present

## 2018-05-13 DIAGNOSIS — Z880 Allergy status to penicillin: Secondary | ICD-10-CM

## 2018-05-13 DIAGNOSIS — I5033 Acute on chronic diastolic (congestive) heart failure: Secondary | ICD-10-CM | POA: Diagnosis not present

## 2018-05-13 DIAGNOSIS — Z885 Allergy status to narcotic agent status: Secondary | ICD-10-CM

## 2018-05-13 DIAGNOSIS — Z886 Allergy status to analgesic agent status: Secondary | ICD-10-CM

## 2018-05-13 DIAGNOSIS — Z724 Inappropriate diet and eating habits: Secondary | ICD-10-CM

## 2018-05-13 DIAGNOSIS — Z91128 Patient's intentional underdosing of medication regimen for other reason: Secondary | ICD-10-CM

## 2018-05-13 DIAGNOSIS — Z79899 Other long term (current) drug therapy: Secondary | ICD-10-CM

## 2018-05-13 DIAGNOSIS — Z6841 Body Mass Index (BMI) 40.0 and over, adult: Secondary | ICD-10-CM

## 2018-05-13 DIAGNOSIS — E669 Obesity, unspecified: Secondary | ICD-10-CM | POA: Diagnosis present

## 2018-05-13 DIAGNOSIS — Z7901 Long term (current) use of anticoagulants: Secondary | ICD-10-CM

## 2018-05-13 DIAGNOSIS — M79602 Pain in left arm: Secondary | ICD-10-CM | POA: Diagnosis not present

## 2018-05-13 DIAGNOSIS — Z90710 Acquired absence of both cervix and uterus: Secondary | ICD-10-CM

## 2018-05-13 DIAGNOSIS — Z8639 Personal history of other endocrine, nutritional and metabolic disease: Secondary | ICD-10-CM

## 2018-05-13 DIAGNOSIS — M069 Rheumatoid arthritis, unspecified: Secondary | ICD-10-CM | POA: Diagnosis present

## 2018-05-13 DIAGNOSIS — Z8249 Family history of ischemic heart disease and other diseases of the circulatory system: Secondary | ICD-10-CM

## 2018-05-13 DIAGNOSIS — T501X6A Underdosing of loop [high-ceiling] diuretics, initial encounter: Secondary | ICD-10-CM | POA: Diagnosis present

## 2018-05-13 DIAGNOSIS — Z882 Allergy status to sulfonamides status: Secondary | ICD-10-CM

## 2018-05-13 DIAGNOSIS — Z881 Allergy status to other antibiotic agents status: Secondary | ICD-10-CM

## 2018-05-13 HISTORY — DX: Long term (current) use of anticoagulants: Z79.01

## 2018-05-13 LAB — BRAIN NATRIURETIC PEPTIDE: B Natriuretic Peptide: 169.6 pg/mL — ABNORMAL HIGH (ref 0.0–100.0)

## 2018-05-13 LAB — COMPREHENSIVE METABOLIC PANEL
ALT: 12 U/L (ref 0–44)
AST: 18 U/L (ref 15–41)
Albumin: 3.1 g/dL — ABNORMAL LOW (ref 3.5–5.0)
Alkaline Phosphatase: 64 U/L (ref 38–126)
Anion gap: 8 (ref 5–15)
BUN: 15 mg/dL (ref 8–23)
CO2: 28 mmol/L (ref 22–32)
Calcium: 8.9 mg/dL (ref 8.9–10.3)
Chloride: 105 mmol/L (ref 98–111)
Creatinine, Ser: 1.02 mg/dL — ABNORMAL HIGH (ref 0.44–1.00)
GFR calc Af Amer: 57 mL/min — ABNORMAL LOW (ref >60)
GFR calc non Af Amer: 49 mL/min — ABNORMAL LOW (ref >60)
Glucose, Bld: 131 mg/dL — ABNORMAL HIGH (ref 70–99)
Potassium: 3.3 mmol/L — ABNORMAL LOW (ref 3.5–5.1)
Sodium: 141 mmol/L (ref 135–145)
Total Bilirubin: 0.9 mg/dL (ref 0.3–1.2)
Total Protein: 7.4 g/dL (ref 6.5–8.1)

## 2018-05-13 LAB — CBC
HCT: 43.1 % (ref 36.0–46.0)
Hemoglobin: 13.4 g/dL (ref 12.0–15.0)
MCH: 29.5 pg (ref 26.0–34.0)
MCHC: 31.1 g/dL (ref 30.0–36.0)
MCV: 94.9 fL (ref 80.0–100.0)
Platelets: 169 10*3/uL (ref 150–400)
RBC: 4.54 MIL/uL (ref 3.87–5.11)
RDW: 17.1 % — ABNORMAL HIGH (ref 11.5–15.5)
WBC: 5.7 10*3/uL (ref 4.0–10.5)
nRBC: 0 % (ref 0.0–0.2)

## 2018-05-13 MED ORDER — HEPARIN SODIUM (PORCINE) 5000 UNIT/ML IJ SOLN
5000.0000 [IU] | Freq: Three times a day (TID) | INTRAMUSCULAR | Status: DC
  Administered 2018-05-13 – 2018-05-14 (×2): 5000 [IU] via SUBCUTANEOUS
  Filled 2018-05-13 (×2): qty 1

## 2018-05-13 MED ORDER — SODIUM CHLORIDE 0.9% FLUSH
3.0000 mL | Freq: Two times a day (BID) | INTRAVENOUS | Status: DC
  Administered 2018-05-13 – 2018-05-22 (×18): 3 mL via INTRAVENOUS

## 2018-05-13 MED ORDER — SODIUM CHLORIDE 0.9 % IV SOLN: 250.0000 mL | INTRAVENOUS | Status: DC | PRN

## 2018-05-13 MED ORDER — ACETAMINOPHEN 325 MG PO TABS
650.0000 mg | ORAL_TABLET | ORAL | Status: DC | PRN
  Administered 2018-05-14 – 2018-05-19 (×2): 650 mg via ORAL
  Filled 2018-05-13 (×2): qty 2

## 2018-05-13 MED ORDER — SODIUM CHLORIDE 0.9% FLUSH: 3.0000 mL | INTRAVENOUS | Status: DC | PRN

## 2018-05-13 MED ORDER — ONDANSETRON HCL 4 MG/2ML IJ SOLN: 4.0000 mg | Freq: Four times a day (QID) | INTRAMUSCULAR | Status: DC | PRN

## 2018-05-13 MED ORDER — FUROSEMIDE 10 MG/ML IJ SOLN
40.0000 mg | Freq: Two times a day (BID) | INTRAMUSCULAR | Status: DC
  Administered 2018-05-13 – 2018-05-15 (×4): 40 mg via INTRAVENOUS
  Filled 2018-05-13 (×5): qty 4

## 2018-05-13 MED ORDER — POTASSIUM CHLORIDE CRYS ER 20 MEQ PO TBCR
40.0000 meq | EXTENDED_RELEASE_TABLET | Freq: Every day | ORAL | Status: DC
  Administered 2018-05-14 – 2018-05-16 (×3): 40 meq via ORAL
  Filled 2018-05-13 (×3): qty 2

## 2018-05-13 NOTE — Progress Notes (Signed)
Patient arrived to room 3E03. Patient is alert and oriented times 4. Belongings and call bell within reach. Bed is in the lowest and locked position with bed rails up times 2. Patient states that she thinks she may have left her wallet in admitting. This nurse contacted admitting and they did not see the patient's wallet there. This nurse contacted patient's daughter Katherine Cervantes and informed her that the patient states her wallet is missing. [name] states she will contact the patient in her room and go by her home to check if it is there. Patient's daughter updated to mother's room number and current status.

## 2018-05-13 NOTE — Progress Notes (Signed)
Paged cardilogy Julien Girt on call about patients arrival to [address]. Patient currently with no orders.

## 2018-05-13 NOTE — Progress Notes (Signed)
Virtual Visit via Video Note    Evaluation Performed:  Follow-up visit  This visit type was conducted due to national recommendations for restrictions regarding the COVID-19 Pandemic (e.g. social distancing).  This format is felt to be most appropriate for this patient at this time.  All issues noted in this document were discussed and addressed.  No physical exam was performed (except for noted visual exam findings with Video Visits).  Please refer to the patient's chart (MyChart message for video visits and phone note for telephone visits) for the patient's consent to telehealth for [facility].  Date:  05/13/2018   ID:  Rosalie Gums, DOB 15-Sep-1929, MRN [id number]  Patient Location:  Home    Provider location:   office  PCP:  Sunnie Nielsen, DO  Cardiologist:  Olga Millers, MD   Electrophysiologist:  Dr. Ladona Ridgel   Chief Complaint:  Edema, shortness of breath  History of Present Illness:    Syd Beare is a 85 y.o. female who presents via audio/video conferencing for a telehealth visit today.    Ms. Webb is an 87F with chronic diastolic heart failure, hypertension, carotid artery disease, persistent atrial fibrillation, status post [facility] pacemaker, and noncompliance here for follow-up.  She was admitted 01/2018 with acute on chronic diastolic heart failure in the setting of noncompliance.  Prior to hospitalization she stopped taking all of her medications.  She required diuresis with IV Lasix and diuresed 15 L.  She was subsequently discharged to a skilled nursing facility.  During that hospitalization her heart rates were well-controlled but she remained in atrial fibrillation.  She was started on amlodipine for poorly controlled hypertension.  She followed up with Corine Shelter, PA-CM 02/2018.  It was noted at that time that she had a lot of social stressors and felt as though she just did not want to go on living.  From a cardiac standpoint she was  stable.  Ms. Furrow was discharged from the rehab 03/2018.  Since being home she has felt like giving up.  She stopped taking her medication over a month ago.  Her lasix was last filled 03/10/18.  Her prescription bottle was for 20 mg of Lasix.  She took a dose yesterday and today.  She did have significant urine output with these doses.  However she has had increasing shortness of breath, orthopnea, and edema.  She does not weigh herself at home but thinks she is up 20 pounds.  She has experienced cellulitis of the right lower extremity and this has recurred.  She has increasing redness each day and her leg is weeping.  She is sleeping in a recliner and has been unable to lay in the bed for several weeks.  She now realizes that she wants to live and has been feeling better emotionally.  She denies fever, chills, or cough.  The patient does not have symptoms concerning for COVID-19 infection (fever, chills, cough, or new shortness of breath).    Prior CV studies:   The following studies were reviewed today:  Echo 01/31/18 Study Conclusions  - Left ventricle: The cavity size was normal. Wall thickness was increased in a pattern of moderate LVH. Systolic function was normal. The estimated ejection fraction was in the range of 60% to 65%. Wall motion was normal; there were no regional wall motion abnormalities. - Mitral valve: Severely calcified annulus. - Left atrium: The atrium was severely dilated. - Right atrium: The atrium was mildly dilated. - Pulmonary arteries: Systolic  pressure was mildly increased. PA peak pressure: 44 mm Hg (S). - Pericardium, extracardiac: A small pericardial effusion was identified.  Impressions:  - Normal LV systolic function; moderate LVH; biatrial enlargement; trace TR with mild pulmonary hypertension; small pericardial effusion.  Past Medical History:  Diagnosis Date   Asthma    Atrial fibrillation (HCC) 09/27/2011   CAD (coronary  artery disease) 09/27/2011   Diabetes mellitus without complication (HCC)    Dyslipidemia 09/27/2011   History of vasculitis 09/27/2011   Hypertension 09/27/2011   Osteoporosis 09/27/2011   Pacemaker    PUD (peptic ulcer disease)    Rheumatoid arthritis(714.0) 09/27/2011   Past Surgical History:  Procedure Laterality Date   ABDOMINAL HYSTERECTOMY     ANKLE SURGERY     APPENDECTOMY     Carpal tunnel     CHOLECYSTECTOMY     PARTIAL GASTRECTOMY       Current Meds  Medication Sig   amLODipine (NORVASC) 5 MG tablet Take 1 tablet (5 mg total) by mouth daily.   apixaban (ELIQUIS) 5 MG TABS tablet Take 1 tablet (5 mg total) by mouth 2 (two) times daily.   furosemide (LASIX) 40 MG tablet Take 1 tablet (40mg ) by mouth daily. May take additional 0.5 table (20mg ) for dyspnea or LE edema.   nitroGLYCERIN (NITROSTAT) 0.4 MG SL tablet Place 1 tablet (0.4 mg total) under the tongue every 5 (five) minutes as needed for chest pain (MAX 3 TABLETS).   traMADol-acetaminophen (ULTRACET) 37.5-325 MG tablet TAKE ONE TABLET BY MOUTH EVERY 8 HOURS AS NEEDED FOR ARTHRITIS PAIN (Patient taking differently: Take 1-2 tablets by mouth every 8 (eight) hours as needed. )     Allergies:   Aspirin; Other; Penicillins; Codeine; Morphine and related; Sulfa antibiotics; Ether; Levaquin [levofloxacin in d5w]; Metoprolol; Prednisone; and Procaine   Social History   Tobacco Use   Smoking status: Never Smoker   Smokeless tobacco: Never Used  Substance Use Topics   Alcohol use: Yes    Alcohol/week: 0.0 standard drinks    Comment: Occasional   Drug use: No     Family Hx: The patient's family history includes Cancer in her brother and brother; Hypertension in her mother.  ROS:   Please see the history of present illness.    All other systems reviewed and are negative.   Labs/Other Tests and Data Reviewed:    Recent Labs: 01/30/2018: B Natriuretic Peptide 193.5; Hemoglobin 14.4; Platelets  196 02/05/2018: ALT 16 02/23/2018: BUN 15; Creatinine, Ser 0.82; Potassium 3.7; Sodium 141   Recent Lipid Panel Lab Results  Component Value Date/Time   CHOL 157 12/24/2017 09:15 AM   TRIG 78 12/24/2017 09:15 AM   HDL 63 12/24/2017 09:15 AM   CHOLHDL 2.5 12/24/2017 09:15 AM   CHOLHDL 3.2 10/13/2015 11:15 AM   LDLCALC 78 12/24/2017 09:15 AM    Wt Readings from Last 3 Encounters:  02/23/18 240 lb (108.9 kg)  02/08/18 245 lb 2.4 oz (111.2 kg)  01/30/18 255 lb (115.7 kg)     Objective:    There were no vitals taken for this visit. GENERAL: Chronically ill-appearing.  No acute distress. HEENT: Pupils equal round.  Oral mucosa unremarkable NECK:  Unable to assess JVD 2/2 poor image quality. No visible thyromegaly EXT:  2+ LE edema to the thighs bilaterally. No cyanosis no clubbing SKIN:  Erythema and weeping of the R LE extremity NEURO:  Speech fluent.  Cranial nerves grossly intact.  Moves all 4 extremities freely PSYCH:  Cognitively  intact, oriented to person place and time   ASSESSMENT & PLAN:    # Acute on chronic diastolic heart failure: # Non-compliance: [name] has acute on chronic heart failure in the setting of noncompliance.  She has significant lower extremity edema.  The video quality on our visit today was poor so I was unable to assess her JVD.  However she does report orthopnea and is sleeping in a chair upright.  Her Lasix was last filled in January and it was a 1 month supply.  She has only been taking 20 mg instead of 40 mg when she does actually take the Lasix.  I suspect that she has at least 20 pounds of fluid that needs to be diuresed.  She does not have a blood pressure cuff at home or a scale.  I do not feel comfortable that we will be able to manage this adequately with oral Lasix.  Additionally, she has right lower extremity redness that is likely cellulitis and may need antibiotic treatment.  She is unsteady on her feet and limits her Lasix dose so that she  does not have to get up to go to the bathroom quickly on her own.  Therefore I recommended that she come to the hospital for diuresis with IV Lasix. chronic diastolic heart failure, we discussed the importance of taking her medications as prescribed.  She expressed understanding and is in a better place mentally.  When she gets depressed and wants to give up she stopped taking her medicines.  # Persistent atrial fibrillation: Presumably she is still in atrial fibrillation.  Continue Eliquis.  I presume that she has been missing this as well.  She is unable to tell me her blood pressure or heart rate today.  COVID-19 Education: The signs and symptoms of COVID-19 were discussed with the patient and how to seek care for testing (follow up with PCP or arrange E-visit).  The importance of social distancing was discussed today.  Patient Risk:   After full review of this patient's clinical status, I feel that they are at least moderate risk at this time.  Time:   Today, I have spent 35 minutes with the patient with telehealth technology discussing heart failure.     Medication Adjustments/Labs and Tests Ordered: Current medicines are reviewed at length with the patient today.  Concerns regarding medicines are outlined above.  Tests Ordered: No orders of the defined types were placed in this encounter.  Medication Changes: No orders of the defined types were placed in this encounter.   Disposition:  Follow up after discharge.  She requested to follow u with me after discharge.  Signed, Chilton Siiffany Fairfield, MD  05/13/2018 2:32 PM    [facility]

## 2018-05-13 NOTE — Patient Instructions (Signed)
Called to get bed. Patient aware to go to [facility] admitting

## 2018-05-14 DIAGNOSIS — I872 Venous insufficiency (chronic) (peripheral): Secondary | ICD-10-CM | POA: Diagnosis not present

## 2018-05-14 DIAGNOSIS — Z7401 Bed confinement status: Secondary | ICD-10-CM | POA: Diagnosis not present

## 2018-05-14 DIAGNOSIS — Z9114 Patient's other noncompliance with medication regimen: Secondary | ICD-10-CM | POA: Diagnosis not present

## 2018-05-14 DIAGNOSIS — Z903 Acquired absence of stomach [part of]: Secondary | ICD-10-CM | POA: Diagnosis not present

## 2018-05-14 DIAGNOSIS — Z713 Dietary counseling and surveillance: Secondary | ICD-10-CM | POA: Diagnosis not present

## 2018-05-14 DIAGNOSIS — M109 Gout, unspecified: Secondary | ICD-10-CM | POA: Diagnosis not present

## 2018-05-14 DIAGNOSIS — L0390 Cellulitis, unspecified: Secondary | ICD-10-CM | POA: Diagnosis not present

## 2018-05-14 DIAGNOSIS — I5032 Chronic diastolic (congestive) heart failure: Secondary | ICD-10-CM | POA: Diagnosis not present

## 2018-05-14 DIAGNOSIS — M810 Age-related osteoporosis without current pathological fracture: Secondary | ICD-10-CM | POA: Diagnosis not present

## 2018-05-14 DIAGNOSIS — E876 Hypokalemia: Secondary | ICD-10-CM | POA: Diagnosis present

## 2018-05-14 DIAGNOSIS — Z91128 Patient's intentional underdosing of medication regimen for other reason: Secondary | ICD-10-CM | POA: Diagnosis not present

## 2018-05-14 DIAGNOSIS — Z90710 Acquired absence of both cervix and uterus: Secondary | ICD-10-CM | POA: Diagnosis not present

## 2018-05-14 DIAGNOSIS — I509 Heart failure, unspecified: Secondary | ICD-10-CM | POA: Diagnosis present

## 2018-05-14 DIAGNOSIS — L03114 Cellulitis of left upper limb: Secondary | ICD-10-CM | POA: Diagnosis not present

## 2018-05-14 DIAGNOSIS — G4730 Sleep apnea, unspecified: Secondary | ICD-10-CM | POA: Diagnosis not present

## 2018-05-14 DIAGNOSIS — I2510 Atherosclerotic heart disease of native coronary artery without angina pectoris: Secondary | ICD-10-CM | POA: Diagnosis not present

## 2018-05-14 DIAGNOSIS — Y929 Unspecified place or not applicable: Secondary | ICD-10-CM | POA: Diagnosis not present

## 2018-05-14 DIAGNOSIS — E119 Type 2 diabetes mellitus without complications: Secondary | ICD-10-CM | POA: Diagnosis not present

## 2018-05-14 DIAGNOSIS — T50996A Underdosing of other drugs, medicaments and biological substances, initial encounter: Secondary | ICD-10-CM | POA: Diagnosis present

## 2018-05-14 DIAGNOSIS — Z950 Presence of cardiac pacemaker: Secondary | ICD-10-CM | POA: Diagnosis not present

## 2018-05-14 DIAGNOSIS — R2681 Unsteadiness on feet: Secondary | ICD-10-CM | POA: Diagnosis present

## 2018-05-14 DIAGNOSIS — I779 Disorder of arteries and arterioles, unspecified: Secondary | ICD-10-CM | POA: Diagnosis not present

## 2018-05-14 DIAGNOSIS — I313 Pericardial effusion (noninflammatory): Secondary | ICD-10-CM | POA: Diagnosis present

## 2018-05-14 DIAGNOSIS — I4821 Permanent atrial fibrillation: Secondary | ICD-10-CM | POA: Diagnosis not present

## 2018-05-14 DIAGNOSIS — T8089XA Other complications following infusion, transfusion and therapeutic injection, initial encounter: Secondary | ICD-10-CM | POA: Diagnosis not present

## 2018-05-14 DIAGNOSIS — F329 Major depressive disorder, single episode, unspecified: Secondary | ICD-10-CM | POA: Diagnosis present

## 2018-05-14 DIAGNOSIS — M069 Rheumatoid arthritis, unspecified: Secondary | ICD-10-CM | POA: Diagnosis not present

## 2018-05-14 DIAGNOSIS — I5033 Acute on chronic diastolic (congestive) heart failure: Secondary | ICD-10-CM | POA: Diagnosis not present

## 2018-05-14 DIAGNOSIS — E669 Obesity, unspecified: Secondary | ICD-10-CM | POA: Diagnosis present

## 2018-05-14 DIAGNOSIS — R609 Edema, unspecified: Secondary | ICD-10-CM | POA: Diagnosis not present

## 2018-05-14 DIAGNOSIS — J45909 Unspecified asthma, uncomplicated: Secondary | ICD-10-CM | POA: Diagnosis not present

## 2018-05-14 DIAGNOSIS — I110 Hypertensive heart disease with heart failure: Secondary | ICD-10-CM | POA: Diagnosis not present

## 2018-05-14 DIAGNOSIS — I4819 Other persistent atrial fibrillation: Secondary | ICD-10-CM | POA: Diagnosis not present

## 2018-05-14 DIAGNOSIS — L03116 Cellulitis of left lower limb: Secondary | ICD-10-CM | POA: Diagnosis not present

## 2018-05-14 DIAGNOSIS — E785 Hyperlipidemia, unspecified: Secondary | ICD-10-CM | POA: Diagnosis not present

## 2018-05-14 DIAGNOSIS — M2550 Pain in unspecified joint: Secondary | ICD-10-CM | POA: Diagnosis not present

## 2018-05-14 DIAGNOSIS — Z6841 Body Mass Index (BMI) 40.0 and over, adult: Secondary | ICD-10-CM | POA: Diagnosis not present

## 2018-05-14 DIAGNOSIS — I4892 Unspecified atrial flutter: Secondary | ICD-10-CM | POA: Diagnosis not present

## 2018-05-14 DIAGNOSIS — I959 Hypotension, unspecified: Secondary | ICD-10-CM | POA: Diagnosis not present

## 2018-05-14 DIAGNOSIS — Z7901 Long term (current) use of anticoagulants: Secondary | ICD-10-CM | POA: Diagnosis not present

## 2018-05-14 DIAGNOSIS — L03115 Cellulitis of right lower limb: Secondary | ICD-10-CM | POA: Diagnosis not present

## 2018-05-14 DIAGNOSIS — Z724 Inappropriate diet and eating habits: Secondary | ICD-10-CM | POA: Diagnosis not present

## 2018-05-14 LAB — BASIC METABOLIC PANEL
Anion gap: 8 (ref 5–15)
BUN: 14 mg/dL (ref 8–23)
CO2: 30 mmol/L (ref 22–32)
Calcium: 8.9 mg/dL (ref 8.9–10.3)
Chloride: 101 mmol/L (ref 98–111)
Creatinine, Ser: 1.02 mg/dL — ABNORMAL HIGH (ref 0.44–1.00)
GFR calc Af Amer: 57 mL/min — ABNORMAL LOW (ref >60)
GFR calc non Af Amer: 49 mL/min — ABNORMAL LOW (ref >60)
Glucose, Bld: 136 mg/dL — ABNORMAL HIGH (ref 70–99)
Potassium: 3.3 mmol/L — ABNORMAL LOW (ref 3.5–5.1)
Sodium: 139 mmol/L (ref 135–145)

## 2018-05-14 MED ORDER — TRAMADOL HCL 50 MG PO TABS
50.0000 mg | ORAL_TABLET | Freq: Four times a day (QID) | ORAL | Status: DC | PRN
  Administered 2018-05-14 – 2018-05-21 (×11): 50 mg via ORAL
  Filled 2018-05-14 (×13): qty 1

## 2018-05-14 MED ORDER — APIXABAN 5 MG PO TABS
5.0000 mg | ORAL_TABLET | Freq: Two times a day (BID) | ORAL | Status: DC
  Administered 2018-05-14 – 2018-05-22 (×17): 5 mg via ORAL
  Filled 2018-05-14 (×17): qty 1

## 2018-05-14 MED ORDER — AMLODIPINE BESYLATE 2.5 MG PO TABS
5.0000 mg | ORAL_TABLET | Freq: Every day | ORAL | Status: DC
  Administered 2018-05-14 – 2018-05-22 (×9): 5 mg via ORAL
  Filled 2018-05-14 (×9): qty 2

## 2018-05-14 NOTE — Consult Note (Signed)
WOC Nurse wound consult note Patient receiving care in [facility] 3E03. Reason for Consult: RLE Wound type: leg is consistent with venous insufficiency and cellulitis Measurement: There are no open wounds to measure Wound bed: The entire RLE is grossly edematous (significantly larger than the LLE), erythematous, and has dried drainage scattered along the pre-tibial area. Drainage (amount, consistency, odor) dried yellow Dressing procedure/placement/frequency: Place Xeroform gauze [place] [phone]) over right lower, anterior leg. Beginning just behind the toes, spiral wrap kerlex to just below the knee.  Then spiral wrap an ace wrap from just behind the toes to just below the knee.  Monitor the wound area(s) for worsening of condition such as: Signs/symptoms of infection,  Increase in size,  Development of or worsening of odor, Development of pain, or increased pain at the affected locations.  Notify the medical team if any of these develop.  Thank you for the consult.  Discussed plan of care with the patient and bedside nurse.  WOC nurse will not follow at this time.  Please re-consult the WOC team if needed.  Helmut Muster, RN, MSN, CWOCN, CNS-BC, pager [phone]

## 2018-05-14 NOTE — Plan of Care (Signed)

## 2018-05-14 NOTE — TOC Initial Note (Signed)
Transition of Care [facility]) - Initial/Assessment Note    Patient Details  Name: Katherine Cervantes MRN: [id number] Date of Birth: 1929/10/21  [facility] CM/SW Contact:    Candie Chroman, LCSW Phone Number: 05/14/2018, 2:32 PM  Clinical Narrative: CSW met with patient, introduced role, and explained that PT recommendations would be discussed. Patient agreeable to SNF placement. She was at [facility] 12/30-1/26 and is interested in returning there again but wants confirmation from her daughter first. CSW provided CMS scores for facilities within 25 miles of her zip code. CSW called daughter and she wants [facility] as well. Admissions coordinator will review referral. No further concerns. CSW encouraged patient and her daughter to contact CSW as needed. CSW will continue to follow patient and her daughter for support and facilitate discharge to SNF once medically stable.    Expected Discharge Plan: Skilled Nursing Facility Barriers to Discharge: Continued Medical Work up   Patient Goals and CMS Choice   CMS Medicare.gov Compare Post Acute Care list provided to:: Patient(Told daughter how to look it up online.)    Expected Discharge Plan and Services Expected Discharge Plan: [place] Choice: [place] Living arrangements for the past 2 months: Single Family Home Expected Discharge Date: 05/19/18                        Prior Living Arrangements/Services Living arrangements for the past 2 months: Single Family Home Lives with:: Self Patient language and need for interpreter reviewed:: No Do you feel safe going back to the place where you live?: Yes      Need for Family Participation in Patient Care: Yes (Comment)     Criminal Activity/Legal Involvement Pertinent to Current Situation/Hospitalization: No - Comment as needed  Activities of Daily Living Home Assistive Devices/Equipment: Shower chair without back,  Environmental consultant (specify type), Cane (specify quad or straight)(4 wheeled walker, 3toed cane, raised toilet seat no rails) ADL Screening (condition at time of admission) Patient's cognitive ability adequate to safely complete daily activities?: Yes Is the patient deaf or have difficulty hearing?: No Does the patient have difficulty seeing, even when wearing glasses/contacts?: No Does the patient have difficulty concentrating, remembering, or making decisions?: No Patient able to express need for assistance with ADLs?: Yes Does the patient have difficulty dressing or bathing?: No Independently performs ADLs?: Yes (appropriate for developmental age) Does the patient have difficulty walking or climbing stairs?: Yes Weakness of Legs: Both Weakness of Arms/Hands: None  Permission Sought/Granted Permission sought to share information with : Facility Sport and exercise psychologist, Family Supports Permission granted to share information with : Yes, Verbal Permission Granted  Share Information with NAME: Arlett Goold  Permission granted to share info w AGENCY: SNF's  Permission granted to share info w Relationship: Daughter  Permission granted to share info w Contact Information: [phone]  Emotional Assessment Appearance:: Appears stated age Attitude/Demeanor/Rapport: Engaged, Gracious Affect (typically observed): Accepting, Appropriate, Calm, Pleasant Orientation: : Oriented to Self, Oriented to Place, Oriented to  Time, Oriented to Situation Alcohol / Substance Use: Never Used Psych Involvement: No (comment)  Admission diagnosis:  CHF Patient Active Problem List   Diagnosis Date Noted  . Venous insufficiency of both lower extremities 02/05/2018  . Bilateral lower extremity edema 02/05/2018  . Cellulitis 02/05/2018  . Physical deconditioning 02/05/2018  . CHF (congestive heart failure) ([place]) 01/30/2018  . Carpal tunnel syndrome, bilateral 12/09/2017  . Gout 12/09/2017  .  Rheumatoid arthritis  ([place]) 12/09/2017  . Non compliance w medication regimen 12/09/2017  . Acute on chronic diastolic heart failure ([place]) 10/03/2016  . Atrial fibrillation, chronic 10/03/2016  . Chest tightness 10/03/2016  . HLD (hyperlipidemia) 10/03/2016  . History of cardiac pacemaker 2/2 CHB 10/03/2016  . Morbid obesity with BMI of 40.0-44.9, adult ([place]) 10/03/2016  . Short-term memory loss 10/03/2016  . Pressure injury of skin 10/03/2016  . Blood in stool 10/15/2015  . Encopresis 10/15/2015  . Incontinence 10/15/2015  . Bullous rash 08/28/2015  . Chronic anticoagulation 08/28/2015  . Cerebrovascular disease 08/24/2014  . Vasculitis ([place]) 04/12/2014  . Neck mass 02/08/2013  . Hyperlipidemia 01/13/2013  . Type 2 diabetes mellitus ([place]) 11/30/2012  . Diverticulosis 11/04/2011  . History of (heterozygous) Hemochromatosis 10/10/2011  . Nephrolithiasis - 0.3cm Right Ureter August 2013 10/07/2011  . Shingles outbreak 10/01/2011  . Dyslipidemia 09/27/2011  . Hypertension 09/27/2011  . CAD (coronary artery disease) 09/27/2011  . Atrial fibrillation ([place]) 09/27/2011  . Sleep apnea 09/27/2011  . History of gout 09/27/2011  . Pacemaker 09/27/2011  . Rheumatoid arthritis(714.0) 09/27/2011  . Osteoporosis 09/27/2011   PCP:  Emeterio Reeve, DO Pharmacy:   [place], [place] - [address] [place] [address] Phone: [phone] Fax: [phone]     Social Determinants of Health (SDOH) Interventions    Readmission Risk Interventions No flowsheet data found.

## 2018-05-14 NOTE — Progress Notes (Signed)
Patient refused standing weight this morning. Pt states that she feels to weak to stand.

## 2018-05-14 NOTE — Progress Notes (Signed)
Pt complaining of pain in her legs. Paged Cardiology. Will continue to monitor.

## 2018-05-14 NOTE — H&P (Signed)
Expand All Collapse All       Virtual Visit via Video Note    Evaluation Performed:  Follow-up visit  This visit type was conducted due to national recommendations for restrictions regarding the COVID-19 Pandemic (e.g. social distancing).  This format is felt to be most appropriate for this patient at this time.  All issues noted in this document were discussed and addressed.  No physical exam was performed (except for noted visual exam findings with Video Visits).  Please refer to the patient's chart (MyChart message for video visits and phone note for telephone visits) for the patient's consent to telehealth [facility].  Date:  05/13/2018   ID:  Katherine Cervantes, DOB Jul 03, 1929, MRN [id number]  Patient Location:  Home                 Provider location:   office  PCP:  Sunnie NielsenAlexander, Natalie, DO             Cardiologist:  Olga MillersBrian Crenshaw, MD   Electrophysiologist:  Dr. Ladona Ridgelaylor   Chief Complaint:  Edema, shortness of breath  History of Present Illness:    Katherine GumsMary Judith Cervantes is a 85 y.o. female who presents via audio/video conferencing for a telehealth visit today.    [name] is an [age]F with chronic diastolic heart failure, hypertension, carotid artery disease, persistent atrial fibrillation, [facility], and noncompliance here for follow-up.  She was admitted 01/2018 with acute on chronic diastolic heart failure in the setting of noncompliance.  Prior to hospitalization she stopped taking all of her medications.  She required diuresis with IV Lasix and diuresed 15 L.  She was subsequently discharged to a skilled nursing facility.  During that hospitalization her heart rates were well-controlled but she remained in atrial fibrillation.  She was started on amlodipine for poorly controlled hypertension.  She followed up with Corine ShelterLuke Kilroy, PA-CM 02/2018.  It was noted at that time that she had a lot of social stressors and felt as though she just did not want  to go on living.  From a cardiac standpoint she was stable.  [name] was discharged from the rehab 03/2018.  Since being home she has felt like giving up.  She stopped taking her medication over a month ago.  Her lasix was last filled 03/10/18.  Her prescription bottle was for 20 mg of Lasix.  She took a dose yesterday and today.  She did have significant urine output with these doses.  However she has had increasing shortness of breath, orthopnea, and edema.  She does not weigh herself at home but thinks she is up 20 pounds.  She has experienced cellulitis of the right lower extremity and this has recurred.  She has increasing redness each day and her leg is weeping.  She is sleeping in a recliner and has been unable to lay in the bed for several weeks.  She now realizes that she wants to live and has been feeling better emotionally.  She denies fever, chills, or cough.  The patient does not have symptoms concerning for COVID-19 infection (fever, chills, cough, or new shortness of breath).    Prior CV studies:   The following studies were reviewed today:  Echo 01/31/18 Study Conclusions  - Left ventricle: The cavity size was normal. Wall thickness was increased in a pattern of moderate LVH. Systolic function was normal. The estimated ejection fraction was in the range of 60% to 65%. Wall motion was normal;  there were no regional wall motion abnormalities. - Mitral valve: Severely calcified annulus. - Left atrium: The atrium was severely dilated. - Right atrium: The atrium was mildly dilated. - Pulmonary arteries: Systolic pressure was mildly increased. PA peak pressure: 44 mm Hg (S). - Pericardium, extracardiac: A small pericardial effusion was identified.  Impressions:  - Normal LV systolic function; moderate LVH; biatrial enlargement; trace TR with mild pulmonary hypertension; small pericardial effusion.      Past Medical History:  Diagnosis Date  .  Asthma   . Atrial fibrillation (HCC) 09/27/2011  . CAD (coronary artery disease) 09/27/2011  . Diabetes mellitus without complication (HCC)   . Dyslipidemia 09/27/2011  . History of vasculitis 09/27/2011  . Hypertension 09/27/2011  . Osteoporosis 09/27/2011  . Pacemaker   . PUD (peptic ulcer disease)   . Rheumatoid arthritis(714.0) 09/27/2011        Past Surgical History:  Procedure Laterality Date  . ABDOMINAL HYSTERECTOMY    . ANKLE SURGERY    . APPENDECTOMY    . Carpal tunnel    . CHOLECYSTECTOMY    . PARTIAL GASTRECTOMY       ActiveMedications      Current Meds  Medication Sig  . amLODipine (NORVASC) 5 MG tablet Take 1 tablet (5 mg total) by mouth daily.  Marland Kitchen apixaban (ELIQUIS) 5 MG TABS tablet Take 1 tablet (5 mg total) by mouth 2 (two) times daily.  . furosemide (LASIX) 40 MG tablet Take 1 tablet (40mg ) by mouth daily. May take additional 0.5 table (20mg ) for dyspnea or LE edema.  . nitroGLYCERIN (NITROSTAT) 0.4 MG SL tablet Place 1 tablet (0.4 mg total) under the tongue every 5 (five) minutes as needed for chest pain (MAX 3 TABLETS).  . traMADol-acetaminophen (ULTRACET) 37.5-325 MG tablet TAKE ONE TABLET BY MOUTH EVERY 8 HOURS AS NEEDED FOR ARTHRITIS PAIN (Patient taking differently: Take 1-2 tablets by mouth every 8 (eight) hours as needed. )       Allergies:   Aspirin; Other; Penicillins; Codeine; Morphine and related; Sulfa antibiotics; Ether; Levaquin [levofloxacin in d5w]; Metoprolol; Prednisone; and Procaine   Social History        Tobacco Use  . Smoking status: Never Smoker  . Smokeless tobacco: Never Used  Substance Use Topics  . Alcohol use: Yes    Alcohol/week: 0.0 standard drinks    Comment: Occasional  . Drug use: No     Family Hx: The patient's family history includes Cancer in her brother and brother; Hypertension in her mother.  ROS:   Please see the history of present illness.    All other systems reviewed and are  negative.   Labs/Other Tests and Data Reviewed:    Recent Labs: 01/30/2018: B Natriuretic Peptide 193.5; Hemoglobin 14.4; Platelets 196 02/05/2018: ALT 16 02/23/2018: BUN 15; Creatinine, Ser 0.82; Potassium 3.7; Sodium 141   Recent Lipid Panel Labs(Brief)  Lab Results  Component Value Date/Time   CHOL 157 12/24/2017 09:15 AM   TRIG 78 12/24/2017 09:15 AM   HDL 63 12/24/2017 09:15 AM   CHOLHDL 2.5 12/24/2017 09:15 AM   CHOLHDL 3.2 10/13/2015 11:15 AM   LDLCALC 78 12/24/2017 09:15 AM         Wt Readings from Last 3 Encounters:  02/23/18 240 lb (108.9 kg)  02/08/18 245 lb 2.4 oz (111.2 kg)  01/30/18 255 lb (115.7 kg)     Objective:    There were no vitals taken for this visit. GENERAL: Chronically ill-appearing.  No acute distress.  HEENT: Pupils equal round.  Oral mucosa unremarkable NECK:  Unable to assess JVD 2/2 poor image quality. No visible thyromegaly EXT:  2+ LE edema to the thighs bilaterally. No cyanosis no clubbing SKIN:  Erythema and weeping of the R LE extremity NEURO:  Speech fluent.  Cranial nerves grossly intact.  Moves all 4 extremities freely PSYCH:  Cognitively intact, oriented to person place and time   ASSESSMENT & PLAN:    # Acute on chronic diastolic heart failure: # Non-compliance: Ms. Curl has acute on chronic heart failure in the setting of noncompliance.  She has significant lower extremity edema.  The video quality on our visit today was poor so I was unable to assess her JVD.  However she does report orthopnea and is sleeping in a chair upright.  Her Lasix was last filled in March 14, 2022 and it was a 1 month supply.  She has only been taking 20 mg instead of 40 mg when she does actually take the Lasix.  I suspect that she has at least 20 pounds of fluid that needs to be diuresed.  She does not have a blood pressure cuff at home or a scale.  I do not feel comfortable that we will be able to manage this adequately with oral Lasix.   Additionally, she has right lower extremity redness that is likely cellulitis and may need antibiotic treatment.  She is unsteady on her feet and limits her Lasix dose so that she does not have to get up to go to the bathroom quickly on her own.  Therefore I recommended that she come to the hospital for diuresis with IV Lasix. chronic diastolic heart failure, we discussed the importance of taking her medications as prescribed.  She expressed understanding and is in a better place mentally.  When she gets depressed and wants to give up she stopped taking her medicines.  # Persistent atrial fibrillation: Presumably she is still in atrial fibrillation.  Continue Eliquis.  I presume that she has been missing this as well.  She is unable to tell me her blood pressure or heart rate today.  COVID-19 Education: The signs and symptoms of COVID-19 were discussed with the patient and how to seek care for testing (follow up with PCP or arrange E-visit).  The importance of social distancing was discussed today.  Patient Risk:   After full review of this patient's clinical status, I feel that they are at least moderate risk at this time.  Time:   Today, I have spent 35 minutes with the patient with telehealth technology discussing heart failure.     Medication Adjustments/Labs and Tests Ordered: Current medicines are reviewed at length with the patient today.  Concerns regarding medicines are outlined above.  Tests Ordered: No orders of the defined types were placed in this encounter.  Medication Changes: No orders of the defined types were placed in this encounter.   Disposition:  Follow up after discharge.  She requested to follow u with me after discharge.  Signed, Chilton Si, MD  05/13/2018 2:32 PM    [facility]

## 2018-05-14 NOTE — Progress Notes (Signed)
Patient a.fib, HR constantly drops to 30s-40s at times (from 60s).

## 2018-05-14 NOTE — Discharge Instructions (Addendum)
Heart Healthy low sodium diet  Weigh daily and call the office for wt gain 3 lbs in a day or 5 lbs in a week  Physical therapy.   BMP in 1 week results to Dr. [name]      Information on my medicine - ELIQUIS (apixaban)  Why was Eliquis prescribed for you? Eliquis was prescribed for you to reduce the risk of a blood clot forming that can cause a stroke if you have a medical condition called atrial fibrillation (a type of irregular heartbeat).  What do You need to know about Eliquis ? Take your Eliquis TWICE DAILY - one tablet in the morning and one tablet in the evening with or without food. If you have difficulty swallowing the tablet whole please discuss with your pharmacist how to take the medication safely.  Take Eliquis exactly as prescribed by your doctor and DO NOT stop taking Eliquis without talking to the doctor who prescribed the medication.  Stopping may increase your risk of developing a stroke.  Refill your prescription before you run out.  After discharge, you should have regular check-up appointments with your healthcare provider that is prescribing your Eliquis.  In the future your dose may need to be changed if your kidney function or weight changes by a significant amount or as you get older.  What do you do if you miss a dose? If you miss a dose, take it as soon as you remember on the same day and resume taking twice daily.  Do not take more than one dose of ELIQUIS at the same time to make up a missed dose.  Important Safety Information A possible side effect of Eliquis is bleeding. You should call your healthcare provider right away if you experience any of the following: ? Bleeding from an injury or your nose that does not stop. ? Unusual colored urine (red or dark brown) or unusual colored stools (red or black). ? Unusual bruising for unknown reasons. ? A serious fall or if you hit your head (even if there is no bleeding).  Some medicines may  interact with Eliquis and might increase your risk of bleeding or clotting while on Eliquis. To help avoid this, consult your healthcare provider or pharmacist prior to using any new prescription or non-prescription medications, including herbals, vitamins, non-steroidal anti-inflammatory drugs (NSAIDs) and supplements.  This website has more information on Eliquis (apixaban): http://www.eliquis.com/eliquis/home

## 2018-05-14 NOTE — Progress Notes (Signed)
Progress Note  Patient Name: Katherine Cervantes Date of Encounter: 05/14/2018  Primary Cardiologist: Olga MillersBrian , MD   Subjective   Complains of dyspnea and chest heaviness (worse with lying flat)  Inpatient Medications    Scheduled Meds: . furosemide  40 mg Intravenous BID  . heparin  5,000 Units Subcutaneous Q8H  . potassium chloride  40 mEq Oral Daily  . sodium chloride flush  3 mL Intravenous Q12H   Continuous Infusions: . sodium chloride     PRN Meds: sodium chloride, acetaminophen, ondansetron (ZOFRAN) IV, sodium chloride flush, traMADol   Vital Signs    Vitals:   05/13/18 2052 05/14/18 0010 05/14/18 0046 05/14/18 0613  BP: (!) 141/58  (!) 155/80 (!) 119/47  Pulse: (!) 56  68 66  Resp: 18  17 18   Temp: 97.7 F (36.5 C)  97.8 F (36.6 C) 97.8 F (36.6 C)  TempSrc: Oral  Oral Oral  SpO2: 98%  100% 97%  Weight:  123.1 kg 125.1 kg 122.8 kg  Height:        Intake/Output Summary (Last 24 hours) at 05/14/2018 0818 Last data filed at 05/14/2018 [id number] Gross per 24 hour  Intake -  Output 1700 ml  Net -1700 ml   Last 3 Weights 05/14/2018 05/14/2018 05/14/2018  Weight (lbs) 270 lb 11.6 oz 275 lb 12.7 oz 271 lb 6.2 oz  Weight (kg) 122.8 kg 125.1 kg 123.1 kg      Telemetry    Atrial fibrillation rate controlled- Personally Reviewed    Physical Exam   GEN: No acute distress.  Obese  Neck: Difficult to assess Cardiac: irregular Respiratory: Clear to auscultation bilaterally. GI: Soft, obese MS: 2+ edema; erythema Neuro:  Nonfocal  Psych: Normal affect   Labs    Chemistry Recent Labs  Lab 05/13/18 1822 05/14/18 0525  NA 141 139  K 3.3* 3.3*  CL 105 101  CO2 28 30  GLUCOSE 131* 136*  BUN 15 14  CREATININE 1.02* 1.02*  CALCIUM 8.9 8.9  PROT 7.4  --   ALBUMIN 3.1*  --   AST 18  --   ALT 12  --   ALKPHOS 64  --   BILITOT 0.9  --   GFRNONAA 49* 49*  GFRAA 57* 57*  ANIONGAP 8 8     Hematology Recent Labs  Lab 05/13/18 1822  WBC 5.7  RBC  4.54  HGB 13.4  HCT 43.1  MCV 94.9  MCH 29.5  MCHC 31.1  RDW 17.1*  PLT 169    BNP Recent Labs  Lab 05/13/18 1822  BNP 169.6*    Patient Profile     85 y.o. female with past medical history of chronic diastolic congestive heart failure, noncompliance, hypertension, permanent atrial fibrillation, prior pacemaker admitted with acute on chronic diastolic congestive heart failure.  Patient not taking medications as instructed. Echo December 2019 showed normal LV function, severe left atrial enlargement, mild right atrial enlargement.  Small pericardial effusion.  Assessment & Plan    1 acute on chronic diastolic congestive heart failure-patient is volume overloaded.  Continue Lasix 40 mg IV twice daily.  Follow renal function closely.  Needs low-sodium diet and fluid restriction.  2 permanent atrial fibrillation-heart rate is controlled on no AV nodal blocking agents. CHADSvasc 6.  Resume apixaban.  3 noncompliance-longstanding issue.  I discussed the importance of being compliant with medications.  4 hypertension-resume amlodipine.  5 prior pacemaker.  For questions or updates, please contact [facility] Please consult  [email or web address] for contact info under        Signed, Olga Millers, MD  05/14/2018, 8:18 AM

## 2018-05-14 NOTE — Evaluation (Signed)
Physical Therapy Evaluation Patient Details Name: Katherine Cervantes MRN: [id number] DOB: [birth year] Today's Date: 05/14/2018   History of Present Illness  85 yo admitted 05/13/2018 admitted from home with heart failure and Rt LE cellulitis. She has a history of noncompliance and depression. Recent admission for heart failure 12/19 with d/c to SNF.    Clinical Impression  Pt presents with an overall decrease in functional mobility secondary to above. PTA, pt from home alone, limited household ambulation with RW, requires assist from some ADLs. Today, pt requiring up to minA for mobility; dependent for some ADL tasks. Discussed recommendation for SNF-level therapies to maximize functional mobility and independence prior to return home; pt in agreement. Pt motivated to participate. Will follow acutely to address established goals.  SpO2 98% at rest on 2L O2 [place] SpO2 90% on RA with activity; returned 2L O2 [place] at end of session    Follow Up Recommendations SNF;Supervision for mobility/OOB    Equipment Recommendations  Other (comment)(TBD)    Recommendations for Other Services OT consult     Precautions / Restrictions Precautions Precautions: Fall Precaution Comments: Urine incontinence Restrictions Weight Bearing Restrictions: No      Mobility  Bed Mobility Overal bed mobility: Needs Assistance Bed Mobility: Supine to Sit     Supine to sit: Mod assist     General bed mobility comments: mod assist for trunk elevation and to scoot hips to EOB - patient assisted well with bed rail -   Transfers Overall transfer level: Needs assistance Equipment used: Rolling walker (2 wheeled) Transfers: Sit to/from Stand Sit to Stand: Min guard;Min assist         General transfer comment: stood from bed and BSC w/ walker and min guard to min A for trunk elevation   Ambulation/Gait Ambulation/Gait assistance: Min guard Gait Distance (Feet): 5 Feet Assistive device: Rolling walker (2  wheeled) Gait Pattern/deviations: Step-to pattern;Antalgic;Wide base of support Gait velocity: slowed    General Gait Details: steps from bed to [facility] and from [facility] to chair   Stairs            Wheelchair Mobility    Modified Rankin (Stroke Patients Only)       Balance Overall balance assessment: Needs assistance Sitting-balance support: Bilateral upper extremity supported Sitting balance-Leahy Scale: Fair Sitting balance - Comments: sits independently at EOB and BSC    Standing balance support: No upper extremity supported;Bilateral upper extremity supported Standing balance-Leahy Scale: Fair Standing balance comment: static standing without UE support - required UE support for dynamic balance                              Pertinent Vitals/Pain Pain Assessment: Faces Faces Pain Scale: Hurts a little bit Pain Location: Rt LE  Pain Descriptors / Indicators: Sore;Discomfort Pain Intervention(s): Monitored during session;Repositioned    Home Living Family/patient expects to be discharged to:: Private residence Living Arrangements: Alone Available Help at Discharge: Family;Available PRN/intermittently;Friend(s) Type of Home: House Home Access: Stairs to enter Entrance Stairs-Rails: None Entrance Stairs-Number of Steps: 1 Home Layout: One level Home Equipment: Walker - 2 wheels;Walker - 4 wheels;Bedside commode;Cane - single point;Shower seat;Transport chair      Prior Function Level of Independence: Needs assistance   Gait / Transfers Assistance Needed: Household ambulation with RW. Limited community amb with RW, sometimes uses transport chair  ADL's / Homemaking Assistance Needed: Assist from daughter or friend to get in/out of shower and  for household tasks. Pt not driving         Hand Dominance   Dominant Hand: Left    Extremity/Trunk Assessment   Upper Extremity Assessment Upper Extremity Assessment: Generalized weakness    Lower Extremity  Assessment Lower Extremity Assessment: Generalized weakness;RLE deficits/detail;LLE deficits/detail RLE Deficits / Details: Edema Rt LE  from cellulitis  LLE Deficits / Details: Edema Lt LE        Communication   Communication: No difficulties  Cognition Arousal/Alertness: Awake/alert Behavior During Therapy: WFL for tasks assessed/performed Overall Cognitive Status: No family/caregiver present to determine baseline cognitive functioning Area of Impairment: Orientation;Safety/judgement;Attention;Memory;Following commands;Awareness;Problem solving                 Orientation Level: Place;Time;Disoriented to Current Attention Level: Selective Memory: Decreased short-term memory Following Commands: Follows one step commands consistently;Follows one step commands with increased time Safety/Judgement: Decreased awareness of deficits Awareness: Intellectual Problem Solving: Requires verbal cues General Comments: February 2020 "I'm in [place]"       General Comments      Exercises     Assessment/Plan    PT Assessment Patient needs continued PT services  PT Problem List Decreased strength;Decreased mobility;Decreased safety awareness;Obesity;Decreased activity tolerance;Decreased skin integrity;Cardiopulmonary status limiting activity;Decreased cognition;Decreased balance;Decreased knowledge of use of DME;Pain       PT Treatment Interventions DME instruction;Functional mobility training;Balance training;Patient/family education;Gait training;Therapeutic activities;Stair training;Therapeutic exercise    PT Goals (Current goals can be found in the Care Plan section)  Acute Rehab PT Goals Patient Stated Goal: wants to be able to walk with her walker and get around her home  PT Goal Formulation: With patient Time For Goal Achievement: 05/28/18 Potential to Achieve Goals: Good    Frequency Min 3X/week   Barriers to discharge Decreased caregiver support lives alone  with some assist from hired caregiver - daughter lives "a few miles away"     Co-evaluation               AM-PAC PT "6 Clicks" Mobility  Outcome Measure Help needed turning from your back to your side while in a flat bed without using bedrails?: A Little Help needed moving from lying on your back to sitting on the side of a flat bed without using bedrails?: A Lot Help needed moving to and from a bed to a chair (including a wheelchair)?: A Lot Help needed standing up from a chair using your arms (e.g., wheelchair or bedside chair)?: A Little Help needed to walk in hospital room?: A Little Help needed climbing 3-5 steps with a railing? : Total 6 Click Score: 14    End of Session Equipment Utilized During Treatment: Oxygen;Gait belt Activity Tolerance: Patient tolerated treatment well;Patient limited by fatigue Patient left: in chair;with call bell/phone within reach Nurse Communication: Mobility status PT Visit Diagnosis: Muscle weakness (generalized) (M62.81);Difficulty in walking, not elsewhere classified (R26.2)    Time: 1610-9604 PT Time Calculation (min) (ACUTE ONLY): 32 min   Charges:   PT Evaluation $PT Eval Moderate Complexity: 1 Mod PT Treatments $Therapeutic Activity: 8-22 mins      Ina Homes, PT, DPT Acute Rehabilitation Services  Pager [phone] Office [phone]  Katherine Cervantes 05/14/2018, 12:48 PM

## 2018-05-14 NOTE — Care Management Obs Status (Signed)
MEDICARE OBSERVATION STATUS NOTIFICATION   Patient Details  Name: Katherine Cervantes MRN: [id number] Date of Birth: 25-Jul-1929   Medicare Observation Status Notification Given:  Yes    Lawerance Sabal, RN 05/14/2018, 10:50 AM

## 2018-05-14 NOTE — NC FL2 (Signed)
[place] MEDICAID FL2 LEVEL OF CARE SCREENING TOOL     IDENTIFICATION  Patient Name: Katherine Cervantes Birthdate: 01-Jun-1929 Sex: female Admission Date (Current Location): 05/13/2018  [facility] and [place] Number:  [employer] and Address:  [facility] [place], [address], [place], [place] [address]      Provider Number: [id number]  Attending Physician Name and Address:  Chilton Si, MD  Relative Name and Phone Number:  Sumner Greeley; daughter; [phone]    Current Level of Care: Hospital Recommended Level of Care: Skilled Nursing Facility Prior Approval Number:    Date Approved/Denied:   PASRR Number: [id number]  Discharge Plan: SNF    Current Diagnoses: Patient Active Problem List   Diagnosis Date Noted  . Venous insufficiency of both lower extremities 02/05/2018  . Bilateral lower extremity edema 02/05/2018  . Cellulitis 02/05/2018  . Physical deconditioning 02/05/2018  . CHF (congestive heart failure) (HCC) 01/30/2018  . Carpal tunnel syndrome, bilateral 12/09/2017  . Gout 12/09/2017  . Rheumatoid arthritis (HCC) 12/09/2017  . Non compliance w medication regimen 12/09/2017  . Acute on chronic diastolic heart failure (HCC) 10/03/2016  . Atrial fibrillation, chronic 10/03/2016  . Chest tightness 10/03/2016  . HLD (hyperlipidemia) 10/03/2016  . History of cardiac pacemaker 2/2 CHB 10/03/2016  . Morbid obesity with BMI of 40.0-44.9, adult (HCC) 10/03/2016  . Short-term memory loss 10/03/2016  . Pressure injury of skin 10/03/2016  . Blood in stool 10/15/2015  . Encopresis 10/15/2015  . Incontinence 10/15/2015  . Bullous rash 08/28/2015  . Chronic anticoagulation 08/28/2015  . Cerebrovascular disease 08/24/2014  . Vasculitis (HCC) 04/12/2014  . Neck mass 02/08/2013  . Hyperlipidemia 01/13/2013  . Type 2 diabetes mellitus (HCC) 11/30/2012  . Diverticulosis 11/04/2011  . History of (heterozygous) Hemochromatosis 10/10/2011   . Nephrolithiasis - 0.3cm Right Ureter August 2013 10/07/2011  . Shingles outbreak 10/01/2011  . Dyslipidemia 09/27/2011  . Hypertension 09/27/2011  . CAD (coronary artery disease) 09/27/2011  . Atrial fibrillation (HCC) 09/27/2011  . Sleep apnea 09/27/2011  . History of gout 09/27/2011  . Pacemaker 09/27/2011  . Rheumatoid arthritis(714.0) 09/27/2011  . Osteoporosis 09/27/2011    Orientation RESPIRATION BLADDER Height & Weight     Time, Self, Situation, Place  O2(2L nasal canula) Incontinent, External catheter Weight: 270 lb 11.6 oz (122.8 kg) Height:  5' (152.4 cm)  BEHAVIORAL SYMPTOMS/MOOD NEUROLOGICAL BOWEL NUTRITION STATUS      Continent Diet(heart healthy; thin liquids)  AMBULATORY STATUS COMMUNICATION OF NEEDS Skin   Extensive Assist Verbally Other (Comment)(blister on right leg; bilateral cellulitis and weeping on legs; MASD on breasts, groin, abdomen; nonpressure wound right leg with compression wrap and gauze)                       Personal Care Assistance Level of Assistance  Bathing, Feeding, Dressing Bathing Assistance: Maximum assistance Feeding assistance: Independent Dressing Assistance: Maximum assistance     Functional Limitations Info  Sight, Hearing, Speech Sight Info: Adequate Hearing Info: Adequate Speech Info: Adequate    SPECIAL CARE FACTORS FREQUENCY  OT (By licensed OT), PT (By licensed PT)     PT Frequency: 5x week OT Frequency: 5x week            Contractures Contractures Info: Not present    Additional Factors Info  Code Status, Allergies Code Status Info: Full Code Allergies Info: ASPIRIN, OTHER, PENICILLINS, CODEINE, MORPHINE AND RELATED, SULFA ANTIBIOTICS, ETHER, LEVAQUIN LEVOFLOXACIN IN D5W, METOPROLOL, PREDNISONE,  PROCAINE            Current Medications (05/14/2018):  This is the current hospital active medication list Current Facility-Administered Medications  Medication Dose Route Frequency Provider Last Rate Last  Dose  . 0.9 %  sodium chloride infusion  250 mL Intravenous PRN Georgie Chard D, NP      . acetaminophen (TYLENOL) tablet 650 mg  650 mg Oral Q4H PRN Georgie Chard D, NP   650 mg at 05/14/18 0219  . amLODipine (NORVASC) tablet 5 mg  5 mg Oral Daily Lewayne Bunting, MD   5 mg at 05/14/18 0953  . apixaban (ELIQUIS) tablet 5 mg  5 mg Oral BID Lewayne Bunting, MD   5 mg at 05/14/18 0953  . furosemide (LASIX) injection 40 mg  40 mg Intravenous BID Georgie Chard D, NP   40 mg at 05/14/18 0953  . ondansetron (ZOFRAN) injection 4 mg  4 mg Intravenous Q6H PRN Georgie Chard D, NP      . potassium chloride SA (K-DUR,KLOR-CON) CR tablet 40 mEq  40 mEq Oral Daily Georgie Chard D, NP   40 mEq at 05/14/18 0953  . sodium chloride flush (NS) 0.9 % injection 3 mL  3 mL Intravenous Q12H Georgie Chard D, NP   3 mL at 05/14/18 0954  . sodium chloride flush (NS) 0.9 % injection 3 mL  3 mL Intravenous PRN Georgie Chard D, NP      . traMADol Janean Sark) tablet 50 mg  50 mg Oral Q6H PRN Esmond Plants, MD   50 mg at 05/14/18 0445     Discharge Medications: Please see discharge summary for a list of discharge medications.  Relevant Imaging Results:  Relevant Lab Results:   Additional Information SSN: [id number] [place], [place]

## 2018-05-14 NOTE — Plan of Care (Signed)
?  Problem: Education: ?Goal: Knowledge of General Education information will improve ?Description: Including pain rating scale, medication(s)/side effects and non-pharmacologic comfort measures ?Outcome: Progressing ?  ?Problem: Health Behavior/Discharge Planning: ?Goal: Ability to manage health-related needs will improve ?Outcome: Progressing ?  ?Problem: Clinical Measurements: ?Goal: Respiratory complications will improve ?Outcome: Progressing ?  ?Problem: Activity: ?Goal: Risk for activity intolerance will decrease ?Outcome: Progressing ?  ?Problem: Pain Managment: ?Goal: General experience of comfort will improve ?Outcome: Progressing ?  ?Problem: Safety: ?Goal: Ability to remain free from injury will improve ?Outcome: Progressing ?  ?Problem: Skin Integrity: ?Goal: Risk for impaired skin integrity will decrease ?Outcome: Progressing ?  ?

## 2018-05-14 NOTE — Progress Notes (Signed)
Cardiology gave me a verbal order for Tramadol 50 mg PRN Q6H.

## 2018-05-15 LAB — BASIC METABOLIC PANEL
Anion gap: 10 (ref 5–15)
BUN: 14 mg/dL (ref 8–23)
CO2: 30 mmol/L (ref 22–32)
Calcium: 8.6 mg/dL — ABNORMAL LOW (ref 8.9–10.3)
Chloride: 97 mmol/L — ABNORMAL LOW (ref 98–111)
Creatinine, Ser: 0.93 mg/dL (ref 0.44–1.00)
GFR calc Af Amer: >60 mL/min (ref >60)
GFR calc non Af Amer: 55 mL/min — ABNORMAL LOW (ref >60)
Glucose, Bld: 138 mg/dL — ABNORMAL HIGH (ref 70–99)
Potassium: 3.3 mmol/L — ABNORMAL LOW (ref 3.5–5.1)
Sodium: 137 mmol/L (ref 135–145)

## 2018-05-15 MED ORDER — POTASSIUM CHLORIDE CRYS ER 20 MEQ PO TBCR
40.0000 meq | EXTENDED_RELEASE_TABLET | Freq: Once | ORAL | Status: AC
  Administered 2018-05-15: 12:00:00 40 meq via ORAL
  Filled 2018-05-15: qty 2

## 2018-05-15 MED ORDER — FUROSEMIDE 10 MG/ML IJ SOLN
80.0000 mg | Freq: Two times a day (BID) | INTRAMUSCULAR | Status: DC
  Administered 2018-05-15 – 2018-05-22 (×14): 80 mg via INTRAVENOUS
  Filled 2018-05-15 (×14): qty 8

## 2018-05-15 MED ORDER — SPIRONOLACTONE 25 MG PO TABS
25.0000 mg | ORAL_TABLET | Freq: Every day | ORAL | Status: DC
  Administered 2018-05-15 – 2018-05-22 (×8): 25 mg via ORAL
  Filled 2018-05-15 (×8): qty 1

## 2018-05-15 NOTE — Plan of Care (Signed)
  Problem: Elimination: Goal: Will not experience complications related to bowel motility Outcome: Progressing   Problem: Safety: Goal: Ability to remain free from injury will improve Outcome: Progressing   

## 2018-05-15 NOTE — Progress Notes (Signed)
Progress Note  Patient Name: Katherine Cervantes Date of Encounter: 05/15/2018  Primary Cardiologist: Olga Millers, MD   Subjective   Still with dyspnea and chest heaviness  Inpatient Medications    Scheduled Meds: . amLODipine  5 mg Oral Daily  . apixaban  5 mg Oral BID  . furosemide  40 mg Intravenous BID  . potassium chloride  40 mEq Oral Daily  . sodium chloride flush  3 mL Intravenous Q12H   Continuous Infusions: . sodium chloride     PRN Meds: sodium chloride, acetaminophen, ondansetron (ZOFRAN) IV, sodium chloride flush, traMADol   Vital Signs    Vitals:   05/15/18 0151 05/15/18 0500 05/15/18 0607 05/15/18 0800  BP: (!) 147/84  138/88 (!) 142/43  Pulse: 69  (!) 59 62  Resp:   18   Temp: (!) 97.5 F (36.4 C)  97.7 F (36.5 C) 97.8 F (36.6 C)  TempSrc: Oral  Oral Oral  SpO2: 100%  99% 98%  Weight:  111.7 kg    Height:        Intake/Output Summary (Last 24 hours) at 05/15/2018 0809 Last data filed at 05/15/2018 0802 Gross per 24 hour  Intake 1680 ml  Output 2550 ml  Net -870 ml   Last 3 Weights 05/15/2018 05/15/2018 05/14/2018  Weight (lbs) 246 lb 4.8 oz 246 lb 270 lb 11.6 oz  Weight (kg) 111.721 kg 111.585 kg 122.8 kg      Telemetry    Atrial fibrillation rate controlled- Personally Reviewed    Physical Exam   GEN: NAD  Obese  Neck: Supple Cardiac: irregular normal S1 and S2 Respiratory: CTA GI: Soft, obese, NT MS: 2+ edema; erythema; wrapped Neuro:  Grossly intact Psych: Normal affect   Labs    Chemistry Recent Labs  Lab 05/13/18 1822 05/14/18 0525 05/15/18 0345  NA 141 139 137  K 3.3* 3.3* 3.3*  CL 105 101 97*  CO2 28 30 30   GLUCOSE 131* 136* 138*  BUN 15 14 14   CREATININE 1.02* 1.02* 0.93  CALCIUM 8.9 8.9 8.6*  PROT 7.4  --   --   ALBUMIN 3.1*  --   --   AST 18  --   --   ALT 12  --   --   ALKPHOS 64  --   --   BILITOT 0.9  --   --   GFRNONAA 49* 49* 55*  GFRAA 57* 57* >60  ANIONGAP 8 8 10      Hematology Recent Labs   Lab 05/13/18 1822  WBC 5.7  RBC 4.54  HGB 13.4  HCT 43.1  MCV 94.9  MCH 29.5  MCHC 31.1  RDW 17.1*  PLT 169    BNP Recent Labs  Lab 05/13/18 1822  BNP 169.6*    Patient Profile     85 y.o. female with past medical history of chronic diastolic congestive heart failure, noncompliance, hypertension, permanent atrial fibrillation, prior pacemaker admitted with acute on chronic diastolic congestive heart failure.  Patient not taking medications as instructed. Echo December 2019 showed normal LV function, severe left atrial enlargement, mild right atrial enlargement.  Small pericardial effusion.  Assessment & Plan    1 acute on chronic diastolic congestive heart failure-I/O - 1350; patient remains volume overloaded.  Change Lasix to 40 mg IV twice daily.  Add spironolactone 25 mg daily. Follow renal function closely.  Needs low-sodium diet and fluid restriction.  2 permanent atrial fibrillation-heart rate is controlled on no AV nodal  blocking agents. CHADSvasc 6.  Continue apixaban.  3 noncompliance-longstanding issue.  I discussed the importance of being compliant with medications.  4 hypertension-BP reasonable; continue present meds and follow.  5 prior pacemaker.  6 hypokalemia-supplement  For questions or updates, please contact [facility] Please consult [email or web address] for contact info under        Signed, Olga Millers, MD  05/15/2018, 8:09 AM

## 2018-05-15 NOTE — Progress Notes (Signed)
PT Cancellation Note  Patient Details Name: Katherine Cervantes MRN: [id number] DOB: 11-21-1929   Cancelled Treatment:    Reason Eval/Treat Not Completed: Patient declined, no reason specified Pt just finished with mobility tech so would like to do PT later. PT will try to see pt later today if time/schedule allows  Katherine Cervantes PT, DPT Acute Rehab [phone]

## 2018-05-15 NOTE — Progress Notes (Signed)
Patient's tablet on bedside (dropped off by daughter)

## 2018-05-15 NOTE — TOC Progression Note (Signed)
Transition of Care [facility]) - Progression Note    Patient Details  Name: Katherine Cervantes MRN: [id number] Date of Birth: [birth year]  [facility] CM/SW Contact  Reola Mosher Phone Number: [phone] 05/15/2018, 1:12 PM  Clinical Narrative:    CM following for progression of care; DCP for SNF when medically stable.   Expected Discharge Plan: Skilled Nursing Facility Barriers to Discharge: No Barriers Identified  Expected Discharge Plan and Services Expected Discharge Plan: Skilled Nursing Facility   Discharge Planning Services: CM Consult Post Acute Care Choice: Skilled Nursing Facility Living arrangements for the past 2 months: Single Family Home Expected Discharge Date: 05/19/18                         Social Determinants of Health (SDOH) Interventions    Readmission Risk Interventions No flowsheet data found.

## 2018-05-15 NOTE — Progress Notes (Signed)
PT Cancellation Note  Patient Details Name: Katherine Cervantes MRN: [id number] DOB: 1929-07-19   Cancelled Treatment:    Reason Eval/Treat Not Completed: Patient declined, no reason specified  Pt asleep in chair but woke with knocking on door. Denied PT reporting she is too sleepy. Per SW note, pt expected d/c on 4/7. Told pt that this is our second attempt and we will not be able to return this afternoon, pt verbalized understanding.   Army Fossa PT, DPT Acute Rehab [phone]

## 2018-05-16 LAB — BASIC METABOLIC PANEL
Anion gap: 12 (ref 5–15)
BUN: 16 mg/dL (ref 8–23)
CO2: 29 mmol/L (ref 22–32)
Calcium: 8.8 mg/dL — ABNORMAL LOW (ref 8.9–10.3)
Chloride: 96 mmol/L — ABNORMAL LOW (ref 98–111)
Creatinine, Ser: 0.95 mg/dL (ref 0.44–1.00)
GFR calc Af Amer: >60 mL/min (ref >60)
GFR calc non Af Amer: 54 mL/min — ABNORMAL LOW (ref >60)
Glucose, Bld: 135 mg/dL — ABNORMAL HIGH (ref 70–99)
Potassium: 3.6 mmol/L (ref 3.5–5.1)
Sodium: 137 mmol/L (ref 135–145)

## 2018-05-16 MED ORDER — POTASSIUM CHLORIDE CRYS ER 20 MEQ PO TBCR
40.0000 meq | EXTENDED_RELEASE_TABLET | Freq: Two times a day (BID) | ORAL | Status: DC
  Administered 2018-05-16 – 2018-05-22 (×11): 40 meq via ORAL
  Filled 2018-05-16 (×12): qty 2

## 2018-05-16 NOTE — Progress Notes (Signed)
Progress Note  Patient Name: Katherine Cervantes Date of Encounter: 05/16/2018  Primary Cardiologist: Olga MillersBrian Kinesha Auten, MD   Subjective   Dyspnea and chest heaviness improving  Inpatient Medications    Scheduled Meds: . amLODipine  5 mg Oral Daily  . apixaban  5 mg Oral BID  . furosemide  80 mg Intravenous BID  . potassium chloride  40 mEq Oral Daily  . sodium chloride flush  3 mL Intravenous Q12H  . spironolactone  25 mg Oral Daily   Continuous Infusions: . sodium chloride     PRN Meds: sodium chloride, acetaminophen, ondansetron (ZOFRAN) IV, sodium chloride flush, traMADol   Vital Signs    Vitals:   05/15/18 2025 05/16/18 0300 05/16/18 0515 05/16/18 1058  BP: (!) 134/54  (!) 135/52 (!) 128/54  Pulse: 64  60 (!) 53  Resp: 18  20 18   Temp: 98.3 F (36.8 C)  98 F (36.7 C) 98 F (36.7 C)  TempSrc: Oral  Oral Oral  SpO2: 98%  98% 98%  Weight:  117.1 kg    Height:        Intake/Output Summary (Last 24 hours) at 05/16/2018 1106 Last data filed at 05/16/2018 0516 Gross per 24 hour  Intake 582 ml  Output 3250 ml  Net -2668 ml   Last 3 Weights 05/16/2018 05/15/2018 05/15/2018  Weight (lbs) 258 lb 2.5 oz 246 lb 4.8 oz 246 lb  Weight (kg) 117.1 kg 111.721 kg 111.585 kg      Telemetry    Atrial fibrillation rate controlled- Personally Reviewed    Physical Exam   GEN: WD NAD  Obese  Neck: Supple, no JVD Cardiac: irregular, bradycardic Respiratory: CTA; no wheeze GI: Soft, obese, NT, no masses MS: 2+ edema; erythema; wrapped Neuro:  No focal findings   Labs    Chemistry Recent Labs  Lab 05/13/18 1822 05/14/18 0525 05/15/18 0345 05/16/18 0316  NA 141 139 137 137  K 3.3* 3.3* 3.3* 3.6  CL 105 101 97* 96*  CO2 28 30 30 29   GLUCOSE 131* 136* 138* 135*  BUN 15 14 14 16   CREATININE 1.02* 1.02* 0.93 0.95  CALCIUM 8.9 8.9 8.6* 8.8*  PROT 7.4  --   --   --   ALBUMIN 3.1*  --   --   --   AST 18  --   --   --   ALT 12  --   --   --   ALKPHOS 64  --   --    --   BILITOT 0.9  --   --   --   GFRNONAA 49* 49* 55* 54*  GFRAA 57* 57* >60 >60  ANIONGAP 8 8 10 12      Hematology Recent Labs  Lab 05/13/18 1822  WBC 5.7  RBC 4.54  HGB 13.4  HCT 43.1  MCV 94.9  MCH 29.5  MCHC 31.1  RDW 17.1*  PLT 169    BNP Recent Labs  Lab 05/13/18 1822  BNP 169.6*    Patient Profile     85 y.o. female with past medical history of chronic diastolic congestive heart failure, noncompliance, hypertension, permanent atrial fibrillation, prior pacemaker admitted with acute on chronic diastolic congestive heart failure.  Patient not taking medications as instructed. Echo December 2019 showed normal LV function, severe left atrial enlargement, mild right atrial enlargement.  Small pericardial effusion.  Assessment & Plan    1 acute on chronic diastolic congestive heart failure-I/O - 2598; patient remains  volume overloaded.  Continue Lasix 80 mg IV twice daily.  Continue spironolactone 25 mg daily. Follow renal function closely.    2 permanent atrial fibrillation-heart rate is controlled on no AV nodal blocking agents. CHADSvasc 6.  Continue apixaban.  3 noncompliance-longstanding issue.  I discussed the importance of being compliant with medications.  4 hypertension-BP controlled; continue present meds.  5 prior pacemaker.  6 hypokalemia-increase kdur to 40 meq BID  For questions or updates, please contact [facility] Please consult [email or web address] for contact info under        Signed, Olga Millers, MD  05/16/2018, 11:06 AM

## 2018-05-16 NOTE — Progress Notes (Signed)
Patient resting in bed at this time with call bell at side. No distress noted. Family called room to speak with patient.

## 2018-05-17 ENCOUNTER — Encounter (HOSPITAL_COMMUNITY): Payer: Self-pay

## 2018-05-17 LAB — BASIC METABOLIC PANEL
Anion gap: 13 (ref 5–15)
BUN: 16 mg/dL (ref 8–23)
CO2: 29 mmol/L (ref 22–32)
Calcium: 8.8 mg/dL — ABNORMAL LOW (ref 8.9–10.3)
Chloride: 94 mmol/L — ABNORMAL LOW (ref 98–111)
Creatinine, Ser: 0.87 mg/dL (ref 0.44–1.00)
GFR calc Af Amer: >60 mL/min (ref >60)
GFR calc non Af Amer: 60 mL/min — ABNORMAL LOW (ref >60)
Glucose, Bld: 135 mg/dL — ABNORMAL HIGH (ref 70–99)
Potassium: 3.9 mmol/L (ref 3.5–5.1)
Sodium: 136 mmol/L (ref 135–145)

## 2018-05-17 MED ORDER — DOCUSATE SODIUM 100 MG PO CAPS
100.0000 mg | ORAL_CAPSULE | Freq: Two times a day (BID) | ORAL | Status: DC
  Administered 2018-05-17 – 2018-05-22 (×6): 100 mg via ORAL
  Filled 2018-05-17 (×11): qty 1

## 2018-05-17 MED ORDER — PSYLLIUM 95 % PO PACK
1.0000 | PACK | Freq: Every day | ORAL | Status: DC
  Administered 2018-05-17 – 2018-05-22 (×5): 1 via ORAL
  Filled 2018-05-17 (×6): qty 1

## 2018-05-17 NOTE — Progress Notes (Signed)
Pt co pain in left elbow that is not relieved from tramadol this am, does not want nurse to touch it is so tender, night shift had applied warm pack, pt says it is worse , paged Provider to advise

## 2018-05-17 NOTE — Progress Notes (Signed)
Patient has not had a BM since 05/14/2018. Paged provider for orders.

## 2018-05-17 NOTE — Progress Notes (Signed)
Progress Note  Patient Name: Katherine Cervantes Date of Encounter: 05/17/2018  Primary Cardiologist: Olga Millers, MD   Subjective   Dyspnea and chest heaviness continue to improve; complains of left elbow pain  Inpatient Medications    Scheduled Meds: . amLODipine  5 mg Oral Daily  . apixaban  5 mg Oral BID  . furosemide  80 mg Intravenous BID  . potassium chloride  40 mEq Oral BID  . sodium chloride flush  3 mL Intravenous Q12H  . spironolactone  25 mg Oral Daily   Continuous Infusions: . sodium chloride     PRN Meds: sodium chloride, acetaminophen, ondansetron (ZOFRAN) IV, sodium chloride flush, traMADol   Vital Signs    Vitals:   05/16/18 1058 05/16/18 1938 05/17/18 0500 05/17/18 0510  BP: (!) 128/54 (!) 156/68  131/78  Pulse: (!) 53 67  78  Resp: 18 20  20   Temp: 98 F (36.7 C) 97.8 F (36.6 C)  99.1 F (37.3 C)  TempSrc: Oral Oral  Oral  SpO2: 98% 98%  95%  Weight:   114.6 kg   Height:        Intake/Output Summary (Last 24 hours) at 05/17/2018 0914 Last data filed at 05/17/2018 3817 Gross per 24 hour  Intake 800 ml  Output 3025 ml  Net -2225 ml   Last 3 Weights 05/17/2018 05/16/2018 05/15/2018  Weight (lbs) 252 lb 10.4 oz 258 lb 2.5 oz 246 lb 4.8 oz  Weight (kg) 114.6 kg 117.1 kg 111.721 kg      Telemetry    Atrial fibrillation rate controlled- Personally Reviewed    Physical Exam   GEN: Obese, NAD Neck: Supple Cardiac: irregular Respiratory: CTA; no rhonchi GI: Soft, obese, NT/ND MS: 2+ edema; erythema; wrapped; LUE-some tenderness over elbow Neuro:  Grossly intact   Labs    Chemistry Recent Labs  Lab 05/13/18 1822  05/15/18 0345 05/16/18 0316 05/17/18 0737  NA 141   < > 137 137 136  K 3.3*   < > 3.3* 3.6 3.9  CL 105   < > 97* 96* 94*  CO2 28   < > 30 29 29   GLUCOSE 131*   < > 138* 135* 135*  BUN 15   < > 14 16 16   CREATININE 1.02*   < > 0.93 0.95 0.87  CALCIUM 8.9   < > 8.6* 8.8* 8.8*  PROT 7.4  --   --   --   --   ALBUMIN  3.1*  --   --   --   --   AST 18  --   --   --   --   ALT 12  --   --   --   --   ALKPHOS 64  --   --   --   --   BILITOT 0.9  --   --   --   --   GFRNONAA 49*   < > 55* 54* 60*  GFRAA 57*   < > >60 >60 >60  ANIONGAP 8   < > 10 12 13    < > = values in this interval not displayed.     Hematology Recent Labs  Lab 05/13/18 1822  WBC 5.7  RBC 4.54  HGB 13.4  HCT 43.1  MCV 94.9  MCH 29.5  MCHC 31.1  RDW 17.1*  PLT 169    BNP Recent Labs  Lab 05/13/18 1822  BNP 169.6*    Patient Profile  85 y.o. female with past medical history of chronic diastolic congestive heart failure, noncompliance, hypertension, permanent atrial fibrillation, prior pacemaker admitted with acute on chronic diastolic congestive heart failure.  Patient not taking medications as instructed. Echo December 2019 showed normal LV function, severe left atrial enlargement, mild right atrial enlargement.  Small pericardial effusion.  Assessment & Plan    1 acute on chronic diastolic congestive heart failure-I/O - 2585; wt 114.6 kg (124.5 kg on admission). Patient remains volume overloaded.  Continue Lasix 80 mg IV twice daily.  Continue spironolactone 25 mg daily. Follow renal function closely.    2 permanent atrial fibrillation-heart rate is controlled on no AV nodal blocking agents. CHADSvasc 6.  Continue apixaban.  3 noncompliance-longstanding issue.  I previously discussed the importance of being compliant with medications.  4 hypertension-BP controlled; continue present meds.  5 prior pacemaker.  6 hypokalemia-continue kdur 40 meq BID and follow  7 elbow pain-unclear etiology; does have h/o RA. Will treat with tylenol and tramadol as needed for now and follow.  For questions or updates, please contact [facility] Please consult [email or web address] for contact info under        Signed, Olga Millers, MD  05/17/2018, 9:14 AM

## 2018-05-18 DIAGNOSIS — I4821 Permanent atrial fibrillation: Secondary | ICD-10-CM

## 2018-05-18 LAB — BASIC METABOLIC PANEL
Anion gap: 11 (ref 5–15)
BUN: 19 mg/dL (ref 8–23)
CO2: 34 mmol/L — ABNORMAL HIGH (ref 22–32)
Calcium: 8.6 mg/dL — ABNORMAL LOW (ref 8.9–10.3)
Chloride: 90 mmol/L — ABNORMAL LOW (ref 98–111)
Creatinine, Ser: 0.98 mg/dL (ref 0.44–1.00)
GFR calc Af Amer: >60 mL/min (ref >60)
GFR calc non Af Amer: 52 mL/min — ABNORMAL LOW (ref >60)
Glucose, Bld: 113 mg/dL — ABNORMAL HIGH (ref 70–99)
Potassium: 4.2 mmol/L (ref 3.5–5.1)
Sodium: 135 mmol/L (ref 135–145)

## 2018-05-18 MED ORDER — ADULT MULTIVITAMIN W/MINERALS CH
1.0000 | ORAL_TABLET | Freq: Every day | ORAL | Status: DC
  Administered 2018-05-18 – 2018-05-22 (×5): 1 via ORAL
  Filled 2018-05-18 (×5): qty 1

## 2018-05-18 MED ORDER — ENSURE ENLIVE PO LIQD
237.0000 mL | Freq: Three times a day (TID) | ORAL | Status: DC
  Administered 2018-05-18 – 2018-05-22 (×8): 237 mL via ORAL

## 2018-05-18 NOTE — Progress Notes (Addendum)
PT Cancellation Note  Patient Details Name: Katherine Cervantes MRN: [id number] DOB: Oct 26, 1929   Cancelled Treatment:    Reason Eval/Treat Not Completed: Patient declined, no reason specified.  Pt initially willing and agreeable to participate, but reported it would take two people.  Once I called the RN techs into the room, pt became belligerent, yelling at me for starting to assist her in getting up (as well as calling her Katherine Cervantes and Katherine Cervantes.  She prefers "Katherine Cervantes" without prefix).  She continually contradicted herself in the session agreeing and then not agreeing to things she requested (inculing pain meds).  Per RN staff report, she also refused mobility tech, Katherine Cervantes (and will usually get up with him and work).  Pt is agreeable for PT to check back tomorrow morning.  She reports her left arm hurts too much to attempt even EOB activity today (RN is in room and aware).    Thanks,  Katherine Cervantes. Katherine Cervantes, PT, DPT  Acute Rehabilitation [phone] pager [phone]) [phone] office   Katherine Cervantes 05/18/2018, 12:35 PM

## 2018-05-18 NOTE — Care Management Important Message (Signed)
Important Message  Patient Details  Name: Katherine Cervantes MRN: [id number] Date of Birth: September 07, 1929   Medicare Important Message Given:  Yes    Alissandra Geoffroy 05/18/2018, 4:01 PM

## 2018-05-18 NOTE — Progress Notes (Signed)
Patient's left arm swollen and warm to touch. IV in extremity removed. Patient's arm elevated on pillow and heat packs applied. Patient grimaces when extremity is touched. Pain medication given to patient will continue to monitor patient.

## 2018-05-18 NOTE — Progress Notes (Signed)
Initial Nutrition Assessment  DOCUMENTATION CODES:   Morbid obesity  INTERVENTION:   -Ensure Enlive po TID, each supplement provides 350 kcal and 20 grams of protein -MVI with minerals daily -Recommend initiation of bowel regimen, as pt has not had a BM since 05/14/18  NUTRITION DIAGNOSIS:   Inadequate oral intake related to social / environmental circumstances, decreased appetite as evidenced by meal completion < 25%.  GOAL:   Patient will meet greater than or equal to 90% of their needs  MONITOR:   PO intake, Supplement acceptance, Labs, Weight trends, Skin, I & O's  REASON FOR ASSESSMENT:   Consult Assessment of nutrition requirement/status  ASSESSMENT:   85 y.o. female with past medical history of chronic diastolic congestive heart failure, noncompliance, hypertension, permanent atrial fibrillation, prior pacemaker admitted with acute on chronic diastolic congestive heart failure.  Patient not taking medications as instructed. Echo December 2019 showed normal LV function, severe left atrial enlargement, mild right atrial enlargement.  Small pericardial effusion.  Pt admitted with CHF.   Reviewed I/O's: -1.9 L x 24 hours and -10.1 L since admission  UOP: 2.8 L x 24 hours  Attempted to speak with pt on the phone, however, pt did not answer. Message reported "call cannot be completed as dialed".   Per RN, pt has a poor appetite and taking very little PO's. She is not taking solid foods and just wants to drink fluids at this time. Reviewed meal completion records. Initially pt was consuming 100% of meals, however, intake has been 0-25% over the past 2 days.   Reviewed wt hx; pt has experienced a 7.7% wt loss over the past 6 months, which is not significant for time frame.  7.7% wt loss x 6 months  Per MD notes, pt has been noncompliant with medications and is depressed; she feels like she wants to "give up".   Pt has not had a BM since 05/14/18; suspect this may also be  contributing to poor oral intake.   Pt with poor oral intake and would benefit from nutrient dense supplement. One Ensure Enlive supplement provides 350 kcals, 20 grams protein.   Labs reviewed.   NUTRITION - FOCUSED PHYSICAL EXAM:    Most Recent Value  Orbital Region  Unable to assess  Upper Arm Region  Unable to assess  Thoracic and Lumbar Region  Unable to assess  Buccal Region  Unable to assess  Temple Region  Unable to assess  Clavicle Bone Region  Unable to assess  Clavicle and Acromion Bone Region  Unable to assess  Scapular Bone Region  Unable to assess  Dorsal Hand  Unable to assess  Patellar Region  Unable to assess  Anterior Thigh Region  Unable to assess  Posterior Calf Region  Unable to assess  Edema (RD Assessment)  Unable to assess  Hair  Unable to assess  Eyes  Unable to assess  Mouth  Unable to assess  Skin  Unable to assess  Nails  Unable to assess       Diet Order:   Diet Order            Diet Heart Room service appropriate? Yes; Fluid consistency: Thin  Diet effective now              EDUCATION NEEDS:   No education needs have been identified at this time  Skin:  Skin Assessment: Skin Integrity Issues: Skin Integrity Issues:: Other (Comment) Other: serous blister on rt leg; RLE cellulitis; MASD breast, perineum,  groin, and abdomen  Last BM:  05/14/18  Height:   Ht Readings from Last 1 Encounters:  05/13/18 5' (1.524 m)    Weight:   Wt Readings from Last 1 Encounters:  05/18/18 113.4 kg    Ideal Body Weight:  45.5 kg  BMI:  Body mass index is 48.83 kg/m.  Estimated Nutritional Needs:   Kcal:  1600-1800  Protein:  110-125 grams  Fluid:  > 1.6 L    Adore Kithcart A. Mayford Knife, RD, LDN, CDCES Registered Dietitian II Certified Diabetes Care and Education Specialist Pager: [phone] After hours Pager: [phone]

## 2018-05-18 NOTE — Progress Notes (Signed)
MD returned call concerning arm Will order U/S for tomorrow and wants arm elevated and warm compresses applied.

## 2018-05-18 NOTE — Progress Notes (Signed)
Patient has been laying in bed all day.  Has been refusing PT, mobility tech and some of her medications.   Pt states her left arm is hurting. Pain medication has been given- helps with overall pain, but does not help arm when it is moved.     Pt will not move arm at all, despite my encouragement to move.    Patient refused to take both potassium pills this morning (was able to get 1) and she spit her multivitamin out- (tried to give it in applesauce).    Paged MD.

## 2018-05-18 NOTE — Progress Notes (Signed)
Progress Note  Patient Name: Katherine Cervantes Date of Encounter: 05/18/2018  Primary Cardiologist: Olga Millers, MD   Subjective   No CP  Breathing is getting better    Inpatient Medications    Scheduled Meds: . amLODipine  5 mg Oral Daily  . apixaban  5 mg Oral BID  . docusate sodium  100 mg Oral BID  . furosemide  80 mg Intravenous BID  . potassium chloride  40 mEq Oral BID  . psyllium  1 packet Oral Daily  . sodium chloride flush  3 mL Intravenous Q12H  . spironolactone  25 mg Oral Daily   Continuous Infusions: . sodium chloride     PRN Meds: sodium chloride, acetaminophen, ondansetron (ZOFRAN) IV, sodium chloride flush, traMADol   Vital Signs    Vitals:   05/18/18 0528 05/18/18 0528 05/18/18 0919 05/18/18 1055  BP:  (!) 153/65 (!) 144/58 (!) 125/53  Pulse:  (!) 58 (!) 53 72  Resp:  20  19  Temp:  98 F (36.7 C)  98.8 F (37.1 C)  TempSrc:    Oral  SpO2:  100% 97% 94%  Weight: 113.4 kg     Height:        Intake/Output Summary (Last 24 hours) at 05/18/2018 1121 Last data filed at 05/18/2018 1050 Gross per 24 hour  Intake 480 ml  Output 2850 ml  Net -2370 ml   Last 3 Weights 05/18/2018 05/17/2018 05/16/2018  Weight (lbs) 250 lb 252 lb 10.4 oz 258 lb 2.5 oz  Weight (kg) 113.4 kg 114.6 kg 117.1 kg      Telemetry    Atrial fibrillation rate controlled 60s   Personally Reviewed    Physical Exam   GEN: WD NAD  Obese  Neck: Neck is full   Cardiac: irregular,  Respiratory: CTA; no wheeze GI: Soft, obese, NT, no masses MS: 2+ edema; erythema;    Labs    Chemistry Recent Labs  Lab 05/13/18 1822  05/16/18 0316 05/17/18 0737 05/18/18 0350  NA 141   < > 137 136 135  K 3.3*   < > 3.6 3.9 4.2  CL 105   < > 96* 94* 90*  CO2 28   < > 29 29 34*  GLUCOSE 131*   < > 135* 135* 113*  BUN 15   < > 16 16 19   CREATININE 1.02*   < > 0.95 0.87 0.98  CALCIUM 8.9   < > 8.8* 8.8* 8.6*  PROT 7.4  --   --   --   --   ALBUMIN 3.1*  --   --   --   --   AST 18   --   --   --   --   ALT 12  --   --   --   --   ALKPHOS 64  --   --   --   --   BILITOT 0.9  --   --   --   --   GFRNONAA 49*   < > 54* 60* 52*  GFRAA 57*   < > >60 >60 >60  ANIONGAP 8   < > 12 13 11    < > = values in this interval not displayed.     Hematology Recent Labs  Lab 05/13/18 1822  WBC 5.7  RBC 4.54  HGB 13.4  HCT 43.1  MCV 94.9  MCH 29.5  MCHC 31.1  RDW 17.1*  PLT 169  BNP Recent Labs  Lab 05/13/18 1822  BNP 169.6*    Patient Profile     85 y.o. female with past medical history of chronic diastolic congestive heart failure, noncompliance, hypertension, permanent atrial fibrillation, prior pacemaker admitted with acute on chronic diastolic congestive heart failure.  Patient not taking medications as instructed. Echo December 2019 showed normal LV function, severe left atrial enlargement, mild right atrial enlargement.  Small pericardial effusion.  Assessment & Plan    1 acute on chronic diastolic CHF   Still with volume increase on exam    10 L negative   .  Continue Lasix 80 mg IV twice daily.  Continue spironolactone 25 mg daily. Follow renal function closely.    2 permanent atrial fibrillation-heart rates are OK   Keep on current meds . CHADSvasc 6.  Continue apixaban.  3 noncompliance-longstanding issue.  IFluid and salt restrict   4 hypertension-BP controlled; continue present meds.  5 prior pacemaker.  6 hypokalemia- K is OK on current regimen    For questions or updates, please contact [facility] Please consult [email or web address] for contact info under        Signed, Dietrich Pates, MD  05/18/2018, 11:21 AM

## 2018-05-19 LAB — CBC
HCT: 42.2 % (ref 36.0–46.0)
Hemoglobin: 13.3 g/dL (ref 12.0–15.0)
MCH: 29.2 pg (ref 26.0–34.0)
MCHC: 31.5 g/dL (ref 30.0–36.0)
MCV: 92.7 fL (ref 80.0–100.0)
Platelets: 210 10*3/uL (ref 150–400)
RBC: 4.55 MIL/uL (ref 3.87–5.11)
RDW: 15.9 % — ABNORMAL HIGH (ref 11.5–15.5)
WBC: 10.5 10*3/uL (ref 4.0–10.5)
nRBC: 0 % (ref 0.0–0.2)

## 2018-05-19 LAB — BASIC METABOLIC PANEL
Anion gap: 12 (ref 5–15)
BUN: 24 mg/dL — ABNORMAL HIGH (ref 8–23)
CO2: 32 mmol/L (ref 22–32)
Calcium: 8.9 mg/dL (ref 8.9–10.3)
Chloride: 90 mmol/L — ABNORMAL LOW (ref 98–111)
Creatinine, Ser: 1.05 mg/dL — ABNORMAL HIGH (ref 0.44–1.00)
GFR calc Af Amer: 55 mL/min — ABNORMAL LOW (ref >60)
GFR calc non Af Amer: 48 mL/min — ABNORMAL LOW (ref >60)
Glucose, Bld: 159 mg/dL — ABNORMAL HIGH (ref 70–99)
Potassium: 4.1 mmol/L (ref 3.5–5.1)
Sodium: 134 mmol/L — ABNORMAL LOW (ref 135–145)

## 2018-05-19 NOTE — Progress Notes (Signed)
Nutrition Follow-up  RD working remotely.  DOCUMENTATION CODES:   Morbid obesity  INTERVENTION:   -Continue Ensure Enlive po TID, each supplement provides 350 kcal and 20 grams of protein -Continue MVI with minerals daily  NUTRITION DIAGNOSIS:   Inadequate oral intake related to social / environmental circumstances, decreased appetite as evidenced by meal completion < 25%.  Ongoing  GOAL:   Patient will meet greater than or equal to 90% of their needs  Progressing  MONITOR:   PO intake, Supplement acceptance, Labs, Weight trends, Skin, I & O's  REASON FOR ASSESSMENT:   Consult Assessment of nutrition requirement/status  ASSESSMENT:   85 y.o. female with past medical history of chronic diastolic congestive heart failure, noncompliance, hypertension, permanent atrial fibrillation, prior pacemaker admitted with acute on chronic diastolic congestive heart failure.  Patient not taking medications as instructed. Echo December 2019 showed normal LV function, severe left atrial enlargement, mild right atrial enlargement.  Small pericardial effusion.  Reviewed I/O's: -2.4 L x 24 hours and -12.6 L since admission  UOP: 3.2 L x 24 hours  Spoke with pt on the phone, who reports feeling better today. She reports her appetite "could be worse, but could be better". She reports she ate "a pretty good breakfast" this morning, but does not recall what she ate or how much she ate.   Attempted to obtain further diet history from pt PTA, however, pt stopped answering RD questions. RD was still able to hear background noise on the phone, however, pt did not respond after multiple times of her name being called or asking the question again. RD terminated phone conversation, since pt stopped responding to questions (heard pt voice and TV noise in the background).   Per MAR, pt has been accepting Ensure supplements. Yesterday, pt was refusing most care and spit MVI out, however, took MVI dose  this AM.   Per RN notes, pt with lt arm swelling, likely related to infiltrated IV. Plan to obtain CBC and determine need for antibiotics.   Medications reviewed and include psyllium and IV furosemide.   Labs reviewed.   Diet Order:   Diet Order            Diet Heart Room service appropriate? Yes; Fluid consistency: Thin; Fluid restriction: 1500 mL Fluid  Diet effective now              EDUCATION NEEDS:   No education needs have been identified at this time  Skin:  Skin Assessment: Skin Integrity Issues: Skin Integrity Issues:: Other (Comment) Other: serous blister on rt leg; RLE cellulitis; MASD breast, perineum, groin, and abdomen  Last BM:  05/18/18  Height:   Ht Readings from Last 1 Encounters:  05/13/18 5' (1.524 m)    Weight:   Wt Readings from Last 1 Encounters:  05/19/18 112.1 kg    Ideal Body Weight:  45.5 kg  BMI:  Body mass index is 48.27 kg/m.  Estimated Nutritional Needs:   Kcal:  1600-1800  Protein:  90-105 grams  Fluid:  > 1.6 L    Zakyia Gagan A. Mayford Knife, RD, LDN, CDCES Registered Dietitian II Certified Diabetes Care and Education Specialist Pager: [phone] After hours Pager: [phone]

## 2018-05-19 NOTE — Progress Notes (Signed)
Physical Therapy Treatment Patient Details Name: Katherine Cervantes MRN: [id number] DOB: Apr 18, 1929 Today's Date: 05/19/2018    History of Present Illness Pt is an 85 y.o. female admitted 05/13/2018 with HF and RLE cellulitis. 4/6 pt now with LUE swelling awaiting ultrasound. PMH includes pacemaker, RA, CAD, DM, afib, depression, noncompliance. Of note, recent admission for heart failure 01/2018 with d/c to SNF until end of 02/2018.   PT Comments    Pt slowly progressing with mobility. Agreeable to sit EOB, requiring maxA for bed mobility. Once seated, able to perform simple ADL tasks with fair balance without UE support. Pt declined standing or OOB transfer despite max encouragement and education regarding prolonged immobility. Pt with poor insight into current condition and remains disoriented, difficult to reason with. Continued to recommend SNF-level therapies due to significant deconditioning and inability to care for self. Will follow acutely.    Follow Up Recommendations  SNF;Supervision for mobility/OOB     Equipment Recommendations  (TBD)    Recommendations for Other Services       Precautions / Restrictions Precautions Precautions: Fall Precaution Comments: Urine incontinence, LUE swollen (awaiting ultrasound) Restrictions Weight Bearing Restrictions: No    Mobility  Bed Mobility Overal bed mobility: Needs Assistance Bed Mobility: Supine to Sit;Sit to Supine     Supine to sit: Max assist Sit to supine: Max assist   General bed mobility comments: Repeated cues to initiate movement, pt reluctant to move. MaxA to assist BLEs/hips to EOB and trunk elevation, pt able to assist with RUE. MaxA for return to supine and to scoot up in bed  Transfers                 General transfer comment: Pt declined despite max encouragement/education  Ambulation/Gait                 Stairs             Wheelchair Mobility    Modified Rankin (Stroke Patients  Only)       Balance Overall balance assessment: Needs assistance Sitting-balance support: Single extremity supported;No upper extremity supported Sitting balance-Leahy Scale: Fair Sitting balance - Comments: Able to sit EOB without UE support to wash face, supervision for balance                                    Cognition Arousal/Alertness: Awake/alert Behavior During Therapy: Flat affect Overall Cognitive Status: No family/caregiver present to determine baseline cognitive functioning Area of Impairment: Orientation;Safety/judgement;Attention;Memory;Following commands;Awareness;Problem solving                 Orientation Level: Disoriented to;Place;Time;Situation Current Attention Level: Sustained Memory: Decreased short-term memory Following Commands: Follows one step commands with increased time Safety/Judgement: Decreased awareness of deficits;Decreased awareness of safety Awareness: Intellectual Problem Solving: Slow processing;Requires verbal cues        Exercises Other Exercises Other Exercises: Pt with c/o neck pain and L rotation limited to midline; educ on neck ROM and shoulder rolls, pt reluctant to try; pillows placed to prevent R-side rotation    General Comments General comments (skin integrity, edema, etc.): Pt reports vagina hurting where purewick sitting; purewick removed and RN notified to check skin integrity      Pertinent Vitals/Pain Pain Assessment: Faces Faces Pain Scale: Hurts little more Pain Location: R foot, LUE, neck Pain Descriptors / Indicators: Sore;Discomfort;Grimacing;Guarding Pain Intervention(s): Monitored during session;Limited activity within patient's tolerance;Repositioned;Heat applied  Home Living                      Prior Function            PT Goals (current goals can now be found in the care plan section) Progress towards PT goals: Not progressing toward goals - comment(Limited by pain and  fatigue having been in bed multiple days)    Frequency    Min 3X/week      PT Plan Current plan remains appropriate    Co-evaluation              AM-PAC PT "6 Clicks" Mobility   Outcome Measure  Help needed turning from your back to your side while in a flat bed without using bedrails?: A Lot Help needed moving from lying on your back to sitting on the side of a flat bed without using bedrails?: A Lot Help needed moving to and from a bed to a chair (including a wheelchair)?: A Lot Help needed standing up from a chair using your arms (e.g., wheelchair or bedside chair)?: A Lot Help needed to walk in hospital room?: A Lot Help needed climbing 3-5 steps with a railing? : Total 6 Click Score: 11    End of Session Equipment Utilized During Treatment: Oxygen Activity Tolerance: Patient limited by fatigue;Patient limited by pain Patient left: in bed;with call bell/phone within reach;with bed alarm set;Other (comment)(neck positioned to prevent R-rotation) Nurse Communication: Mobility status PT Visit Diagnosis: Muscle weakness (generalized) (M62.81);Difficulty in walking, not elsewhere classified (R26.2)     Time: 0354-6568 PT Time Calculation (min) (ACUTE ONLY): 26 min  Charges:  $Therapeutic Activity: 23-37 mins                    Ina Homes, PT, DPT Acute Rehabilitation Services  Pager [phone] Office [phone]  Katherine Cervantes 05/19/2018, 9:48 AM

## 2018-05-19 NOTE — Progress Notes (Signed)
Spoke with patient's daughters Deyla Tebbe and April Ryan) over the phone this morning and requested for an update.  Questions and concerns were answered following unit and hospital protocols.  Daughters were concern as patient is not answering phone calls. Patient will respond to verbal stimuli, but refuses to eat.  Sips of water has been given throughout shift.  Spoke with patient and made her aware that daughters want to talk to her, and told her that whenever she wants me to dial for her she can let me know, pt states understanding.

## 2018-05-19 NOTE — Progress Notes (Signed)
Nurse tech tried to get patient cleaned up and repositioned, but patient refused.  I talked and gave patient education on importance of reposition and clean skin, but patient refused and verbalized understanding. Instructed the patient to press her call bell if she needed assistance or wanted to get repositioned.

## 2018-05-19 NOTE — Progress Notes (Signed)
Patient's son called Maurine Minister [phone]) regarding concern that patient is not answering phone calls.  Spoke with patient to let her know that Ranae Kidwell was trying to call her and she said that she didn't feel good right now.  Spoke with patient and made her aware that whenever she wants me to dial for her she can let me know, pt verbalizes understanding.

## 2018-05-19 NOTE — Progress Notes (Signed)
Progress Note  Patient Name: Katherine Cervantes Date of Encounter: 05/19/2018  Primary Cardiologist: Olga MillersBrian Crenshaw, MD   Subjective   "Not sure how I feel"   L arm tender   No CP    Inpatient Medications    Scheduled Meds: . amLODipine  5 mg Oral Daily  . apixaban  5 mg Oral BID  . docusate sodium  100 mg Oral BID  . feeding supplement (ENSURE ENLIVE)  237 mL Oral TID BM  . furosemide  80 mg Intravenous BID  . multivitamin with minerals  1 tablet Oral Daily  . potassium chloride  40 mEq Oral BID  . psyllium  1 packet Oral Daily  . sodium chloride flush  3 mL Intravenous Q12H  . spironolactone  25 mg Oral Daily   Continuous Infusions: . sodium chloride     PRN Meds: sodium chloride, acetaminophen, ondansetron (ZOFRAN) IV, sodium chloride flush, traMADol   Vital Signs    Vitals:   05/18/18 1946 05/19/18 0453 05/19/18 0614 05/19/18 0821  BP: 137/71 (!) 117/53  (!) 140/56  Pulse: 77 78  (!) 57  Resp: 18 16  19   Temp: 99.2 F (37.3 C) 98 F (36.7 C)  98.1 F (36.7 C)  TempSrc: Oral Oral  Oral  SpO2: 98% 94%  93%  Weight:  111.5 kg 112.1 kg   Height:        Intake/Output Summary (Last 24 hours) at 05/19/2018 1004 Last data filed at 05/19/2018 0839 Gross per 24 hour  Intake 483 ml  Output 2850 ml  Net -2367 ml    Net neg 12.3 L   Last 3 Weights 05/19/2018 05/19/2018 05/18/2018  Weight (lbs) 247 lb 2.2 oz 245 lb 13 oz 250 lb  Weight (kg) 112.1 kg 111.5 kg 113.4 kg      Telemetry    Atrial fibrillation rate controlled 70s   Personally Reviewed    Physical Exam   GEN: WD NAD  Obese  Neck: Neck is full   Cardiac: irregular, irreg      Respiratory: CTA; no wheeze GI: Soft, obese, NT, no masses MS: 2+ edema; erythema;   L arm swollen, L hand swollen and Red     Labs    Chemistry Recent Labs  Lab 05/13/18 1822  05/17/18 0737 05/18/18 0350 05/19/18 0904  NA 141   < > 136 135 134*  K 3.3*   < > 3.9 4.2 4.1  CL 105   < > 94* 90* 90*  CO2 28   < > 29  34* 32  GLUCOSE 131*   < > 135* 113* 159*  BUN 15   < > 16 19 24*  CREATININE 1.02*   < > 0.87 0.98 1.05*  CALCIUM 8.9   < > 8.8* 8.6* 8.9  PROT 7.4  --   --   --   --   ALBUMIN 3.1*  --   --   --   --   AST 18  --   --   --   --   ALT 12  --   --   --   --   ALKPHOS 64  --   --   --   --   BILITOT 0.9  --   --   --   --   GFRNONAA 49*   < > 60* 52* 48*  GFRAA 57*   < > >60 >60 55*  ANIONGAP 8   < > 13  11 12   < > = values in this interval not displayed.     Hematology Recent Labs  Lab 05/13/18 1822  WBC 5.7  RBC 4.54  HGB 13.4  HCT 43.1  MCV 94.9  MCH 29.5  MCHC 31.1  RDW 17.1*  PLT 169    BNP Recent Labs  Lab 05/13/18 1822  BNP 169.6*    Patient Profile     85 y.o. female with past medical history of chronic diastolic congestive heart failure, noncompliance, hypertension, permanent atrial fibrillation, prior pacemaker admitted with acute on chronic diastolic congestive heart failure.  Patient not taking medications as instructed. Echo December 2019 showed normal LV function, severe left atrial enlargement, mild right atrial enlargement.  Small pericardial effusion.  Assessment & Plan    1 acute on chronic diastolic CHF   Still with volume increase on exam    10 L negative   .  Continue Lasix 80 mg IV twice daily.  Continue spironolactone 25 mg daily. Keep up with plan    Tolerating and renal function is good   2 permanent atrial fibrillation-heart rates are OK   Keep on current meds . CHADSvasc 6.  Continue apixaban.  3 noncompliance-longstanding issue.  IFluid and salt restrict   4 hypertension-BP controlled; continue present meds.  5 prior pacemaker.  6 hypokalemia- K is OK on current regimen    7  L arm sweling   Looks like IV infitrated at some point    Heat   Elevate   Will get CBC   ? Need for ABX    For questions or updates, please contact [facility] Please consult [email or web address] for contact info under        Signed, Dietrich Pates, MD   05/19/2018, 10:04 AM

## 2018-05-19 NOTE — Progress Notes (Signed)
Called Vascular ultrasound this morning to check if patient's vascular ultrasound was done for her left arm.  Patient was not in their list.  Paged [facility] team A  PA to make aware that pt was not scheduled for Vascular Ultrasound.  No new orders.

## 2018-05-20 LAB — BASIC METABOLIC PANEL
Anion gap: 13 (ref 5–15)
BUN: 31 mg/dL — ABNORMAL HIGH (ref 8–23)
CO2: 31 mmol/L (ref 22–32)
Calcium: 8.6 mg/dL — ABNORMAL LOW (ref 8.9–10.3)
Chloride: 89 mmol/L — ABNORMAL LOW (ref 98–111)
Creatinine, Ser: 0.8 mg/dL (ref 0.44–1.00)
GFR calc Af Amer: >60 mL/min (ref >60)
GFR calc non Af Amer: >60 mL/min (ref >60)
Glucose, Bld: 184 mg/dL — ABNORMAL HIGH (ref 70–99)
Potassium: 4.9 mmol/L (ref 3.5–5.1)
Sodium: 133 mmol/L — ABNORMAL LOW (ref 135–145)

## 2018-05-20 MED ORDER — DOXYCYCLINE HYCLATE 100 MG PO TABS
100.0000 mg | ORAL_TABLET | Freq: Two times a day (BID) | ORAL | Status: DC
  Administered 2018-05-20 – 2018-05-22 (×5): 100 mg via ORAL
  Filled 2018-05-20 (×5): qty 1

## 2018-05-20 NOTE — Progress Notes (Addendum)
Progress Note  Patient Name: Katherine Cervantes Date of Encounter: 05/20/2018  Primary Cardiologist: Olga Millers, MD   Subjective   Breathing is some better   No CP   L hand tender     Inpatient Medications    Scheduled Meds: . amLODipine  5 mg Oral Daily  . apixaban  5 mg Oral BID  . docusate sodium  100 mg Oral BID  . feeding supplement (ENSURE ENLIVE)  237 mL Oral TID BM  . furosemide  80 mg Intravenous BID  . multivitamin with minerals  1 tablet Oral Daily  . potassium chloride  40 mEq Oral BID  . psyllium  1 packet Oral Daily  . sodium chloride flush  3 mL Intravenous Q12H  . spironolactone  25 mg Oral Daily   Continuous Infusions: . sodium chloride     PRN Meds: sodium chloride, acetaminophen, ondansetron (ZOFRAN) IV, sodium chloride flush, traMADol   Vital Signs    Vitals:   05/19/18 1531 05/19/18 1532 05/19/18 1959 05/20/18 0528  BP: 138/74  119/60 (!) 122/59  Pulse: (!) 59  (!) 52 (!) 52  Resp:   20 18  Temp:  99.2 F (37.3 C) (!) 97.5 F (36.4 C) (!) 97.3 F (36.3 C)  TempSrc:  Oral Oral Oral  SpO2: 98%  98% 100%  Weight:    108 kg  Height:        Intake/Output Summary (Last 24 hours) at 05/20/2018 0825 Last data filed at 05/20/2018 0554 Gross per 24 hour  Intake 1013 ml  Output 2100 ml  Net -1087 ml    Net neg 13.6 L   Last 3 Weights 05/20/2018 05/19/2018 05/19/2018  Weight (lbs) 238 lb 1.6 oz 247 lb 2.2 oz 245 lb 13 oz  Weight (kg) 108 kg 112.1 kg 111.5 kg      Telemetry    Atrial fibrillation    Personally Reviewed    Physical Exam   GEN: WD NAD  Obese  Neck: Neck is full   Cardiac: irregular, irreg      Respiratory: CTA; no wheeze GI: Soft, obese, NT, no masses MS: 1+ + edema; erythema;   L arm swollen, L hand swollen and Red     Labs    Chemistry Recent Labs  Lab 05/13/18 1822  05/17/18 0737 05/18/18 0350 05/19/18 0904  NA 141   < > 136 135 134*  K 3.3*   < > 3.9 4.2 4.1  CL 105   < > 94* 90* 90*  CO2 28   < > 29  34* 32  GLUCOSE 131*   < > 135* 113* 159*  BUN 15   < > 16 19 24*  CREATININE 1.02*   < > 0.87 0.98 1.05*  CALCIUM 8.9   < > 8.8* 8.6* 8.9  PROT 7.4  --   --   --   --   ALBUMIN 3.1*  --   --   --   --   AST 18  --   --   --   --   ALT 12  --   --   --   --   ALKPHOS 64  --   --   --   --   BILITOT 0.9  --   --   --   --   GFRNONAA 49*   < > 60* 52* 48*  GFRAA 57*   < > >60 >60 55*  ANIONGAP 8   < >  13 11 12    < > = values in this interval not displayed.     Hematology Recent Labs  Lab 05/13/18 1822 05/19/18 1019  WBC 5.7 10.5  RBC 4.54 4.55  HGB 13.4 13.3  HCT 43.1 42.2  MCV 94.9 92.7  MCH 29.5 29.2  MCHC 31.1 31.5  RDW 17.1* 15.9*  PLT 169 210    BNP Recent Labs  Lab 05/13/18 1822  BNP 169.6*    Patient Profile     85 y.o. female with past medical history of chronic diastolic congestive heart failure, noncompliance, hypertension, permanent atrial fibrillation, prior pacemaker admitted with acute on chronic diastolic congestive heart failure.  Patient not taking medications as instructed. Echo December 2019 showed normal LV function, severe left atrial enlargement, mild right atrial enlargement.  Small pericardial effusion.  Assessment & Plan    1 acute on chronic diastolic CHF   Still with volume increase on exam    14  L negative   .  Continue   Still with increased volume on exam   Keep up with lasix    2 permanent atrial fibrillation-heart rates are OK   Keep on current meds . CHADSvasc 6.  Continue apixaban.  3 noncompliance-longstanding issue.  IFluid and salt restrict   4 hypertension-BP controlled; .  5 prior pacemaker.  6 hypokalemia- K is 4.1    7  L arm sweling   Looks like IV infitrated at some point    Heat   Elevate  CBC is normal  Low grade temp yesterday    I would recomm    emperic abx (oral ) for cellulitis   Rviewed with pharmacy   Will add doxycyclin      Closer to d/c    Has place at SNF      For questions or updates, please  contact [facility] Please consult [email or web address] for contact info under        Signed, Dietrich Pates, MD  05/20/2018, 8:25 AM

## 2018-05-20 NOTE — Progress Notes (Signed)
Physical Therapy Treatment Patient Details Name: Katherine Cervantes MRN: [id number] DOB: 28-Apr-1929 Today's Date: 05/20/2018    History of Present Illness Pt is an 85 y.o. female admitted 05/13/2018 with HF and RLE cellulitis. Pt also with LUE swelling; per cardiology, likely IV infitrated at some point. PMH includes pacemaker, RA, CAD, DM, afib, depression, noncompliance. Of note, recent admission for heart failure 01/2018 with d/c to SNF until end of 02/2018.   PT Comments    Pt seen with OT having declined mobility with PT and staff earlier today. Pt currently requires max-totalA+2 for mobility; unable to tolerate standing, requiring totalA to pivot to recliner. Pt reluctant for any mobility due to pain. Pt clearly confused and intermittently disoriented; poor insight into deficits/current condition, and difficult to reason with regarding importance of mobility. Continue to recommend SNF-level therapies.    Follow Up Recommendations  SNF;Supervision for mobility/OOB     Equipment Recommendations  (TBD)    Recommendations for Other Services Other (comment)(Palliative consult)     Precautions / Restrictions Precautions Precautions: Fall Precaution Comments: incontinent of urine Restrictions Weight Bearing Restrictions: No    Mobility  Bed Mobility Overal bed mobility: Needs Assistance Bed Mobility: Rolling;Supine to Sit Rolling: Total assist;+2 for physical assistance;+2 for safety/equipment   Supine to sit: Max assist;+2 for physical assistance;+2 for safety/equipment     General bed mobility comments: Repeated cues to initiate movement, pt reluctant to move. MaxA to assist BLEs/hips to EOB and trunk elevation, pt able to assist with RUE. +2 assist required for all  Transfers Overall transfer level: Needs assistance Equipment used: Rolling walker (2 wheeled);2 person hand held assist Transfers: Sit to/from [employer] Sit to Stand: Max assist;+2 physical  assistance;+2 safety/equipment;From elevated surface   Squat pivot transfers: Total assist;+2 physical assistance;+2 safety/equipment;From elevated surface     General transfer comment: Pt unable to achieve fully upright posture standing with RW despite maxA+2 c/o leg pain. TotalA+2 for squat pivot from bed to recliner with bilat HHA  Ambulation/Gait                 Stairs             Wheelchair Mobility    Modified Rankin (Stroke Patients Only)       Balance Overall balance assessment: Needs assistance Sitting-balance support: Single extremity supported;No upper extremity supported Sitting balance-Leahy Scale: Fair Sitting balance - Comments: Able to sit EOB without UE support to wash face, supervision for balance   Standing balance support: Bilateral upper extremity supported Standing balance-Leahy Scale: Zero Standing balance comment: completely dependent on external support                            Cognition Arousal/Alertness: Awake/alert Behavior During Therapy: Flat affect Overall Cognitive Status: No family/caregiver present to determine baseline cognitive functioning Area of Impairment: Orientation;Safety/judgement;Attention;Memory;Following commands;Awareness;Problem solving                 Orientation Level: Disoriented to;Place;Time;Situation Current Attention Level: Sustained Memory: Decreased short-term memory Following Commands: Follows one step commands with increased time Safety/Judgement: Decreased awareness of deficits;Decreased awareness of safety Awareness: Intellectual Problem Solving: Slow processing;Requires verbal cues General Comments: Im in [place] [facility]      Exercises      General Comments General comments (skin integrity, edema, etc.): Pt found in urine in the bed - purewick had been collecting, but not everything. Her bed was visibly wet and  had a clear ring where urine had been previously  and dried      Pertinent Vitals/Pain Pain Assessment: Faces Pain Score: 10-Worst pain ever Faces Pain Scale: Hurts even more Pain Location: Bilateral knees, neck, L arm/shoulder Pain Descriptors / Indicators: Sore;Discomfort;Grimacing;Guarding;Moaning Pain Intervention(s): Limited activity within patient's tolerance;Monitored during session;Repositioned    Home Living Family/patient expects to be discharged to:: Private residence Living Arrangements: Alone Available Help at Discharge: Family;Available PRN/intermittently;Friend(s) Type of Home: House Home Access: Stairs to enter Entrance Stairs-Rails: None Home Layout: One level Home Equipment: Environmental consultant - 2 wheels;Walker - 4 wheels;Bedside commode;Cane - single point;Shower seat;Transport chair      Prior Function Level of Independence: Needs assistance  Gait / Transfers Assistance Needed: Household ambulation with RW. Limited community amb with RW, sometimes uses transport chair ADL's / Homemaking Assistance Needed: Assist from daughter or friend to get in/out of shower and for household tasks. Pt not driving      PT Goals (current goals can now be found in the care plan section) Acute Rehab PT Goals Patient Stated Goal: wants to get back out in the garden PT Goal Formulation: With patient Time For Goal Achievement: 05/28/18 Potential to Achieve Goals: Poor Progress towards PT goals: Not progressing toward goals - comment(Limited by pain/fatigue laying in bed multiple days)    Frequency    Min 2X/week      PT Plan Frequency needs to be updated    Co-evaluation PT/OT/SLP Co-Evaluation/Treatment: Yes Reason for Co-Treatment: Necessary to address cognition/behavior during functional activity;For patient/therapist safety;To address functional/ADL transfers PT goals addressed during session: Mobility/safety with mobility OT goals addressed during session: Proper use of Adaptive equipment and DME      AM-PAC PT "6 Clicks"  Mobility   Outcome Measure  Help needed turning from your back to your side while in a flat bed without using bedrails?: Total Help needed moving from lying on your back to sitting on the side of a flat bed without using bedrails?: Total Help needed moving to and from a bed to a chair (including a wheelchair)?: Total Help needed standing up from a chair using your arms (e.g., wheelchair or bedside chair)?: Total Help needed to walk in hospital room?: Total Help needed climbing 3-5 steps with a railing? : Total 6 Click Score: 6    End of Session   Activity Tolerance: Patient limited by fatigue;Patient limited by pain Patient left: in chair;with call bell/phone within reach;with chair alarm set Nurse Communication: Mobility status;Need for lift equipment PT Visit Diagnosis: Muscle weakness (generalized) (M62.81);Difficulty in walking, not elsewhere classified (R26.2)     Time: 7494-4967 PT Time Calculation (min) (ACUTE ONLY): 41 min  Charges:  $Therapeutic Activity: 23-37 mins                    Ina Homes, PT, DPT Acute Rehabilitation Services  Pager [phone] Office [phone]  Malachy Chamber 05/20/2018, 6:43 PM

## 2018-05-20 NOTE — TOC Progression Note (Signed)
Transition of Care [facility]) - Progression Note    Patient Details  Name: Katherine Cervantes MRN: [id number] Date of Birth: 23-Sep-1929  Transition of [facility]) CM/SW Contact  Margarito Liner, LCSW Phone Number: 05/20/2018, 4:00 PM  Clinical Narrative: Per MD note, patient is getting closer to discharge. SNF can definitely take her tomorrow, Friday, or Monday if stable at that point. They typically do not do weekend admissions so they would have to get permission from administration if absolutely necessary. If weekend discharge they also would have to have any hard prescriptions by Friday. Sent message to MD to notify.  Expected Discharge Plan: Skilled Nursing Facility Barriers to Discharge: No Barriers Identified  Expected Discharge Plan and Services Expected Discharge Plan: Skilled Nursing Facility   Discharge Planning Services: CM Consult Post Acute Care Choice: Skilled Nursing Facility Living arrangements for the past 2 months: Single Family Home Expected Discharge Date: 05/19/18                         Social Determinants of Health (SDOH) Interventions    Readmission Risk Interventions No flowsheet data found.

## 2018-05-20 NOTE — Progress Notes (Signed)
Physical Therapy Treatment Patient Details Name: Katherine Cervantes MRN: [id number] DOB: Jun 16, 1929 Today's Date: 05/20/2018    History of Present Illness Pt is an 85 y.o. female admitted 05/13/2018 with HF and RLE cellulitis. Pt also with LUE swelling; per cardiology, likely IV infitrated at some point. PMH includes pacemaker, RA, CAD, DM, afib, depression, noncompliance. Of note, recent admission for heart failure 01/2018 with d/c to SNF until end of 02/2018.   PT Comments    Pt seen for additional session for safe transfer back to bed. Required maximove lift and totalA. Pt tolerated okay, although limited by pain with all mobility. Pt continues to demonstrate poor insight into current condition and difficult to reason with regarding reason for mobility. Recommend use of maximove for future transfers out of bed with nursing staff (RN/NT aware).   Follow Up Recommendations  SNF;Supervision for mobility/OOB     Equipment Recommendations  (TBD)    Recommendations for Other Services (Palliative consult)     Precautions / Restrictions Precautions Precautions: Fall Precaution Comments: incontinent of urine Restrictions Weight Bearing Restrictions: No    Mobility  Bed Mobility Overal bed mobility: Needs Assistance Bed Mobility: Rolling Rolling: Total assist;+2 for physical assistance;+2 for safety/equipment   Supine to sit: Max assist;+2 for physical assistance;+2 for safety/equipment     General bed mobility comments: TotalA to roll R/L for removal of maximove pad and saturated bed pads; dependent for pericare. Pt limited by c/o neck pain throughout  Transfers Overall transfer level: Needs assistance Equipment used: Rolling walker (2 wheeled);2 person hand held assist Transfers: Sit to/from [employer] Sit to Stand: Max assist;+2 physical assistance;+2 safety/equipment;From elevated surface   Squat pivot transfers: Total assist;+2 physical assistance;+2  safety/equipment;From elevated surface     General transfer comment: Transfer from recliner to bed with maximove; pt tolerated ok, limited by c/o pain with all mobility  Ambulation/Gait                 Stairs             Wheelchair Mobility    Modified Rankin (Stroke Patients Only)       Balance Overall balance assessment: Needs assistance Sitting-balance support: Single extremity supported;No upper extremity supported Sitting balance-Leahy Scale: Fair Sitting balance - Comments: Able to sit EOB without UE support to wash face, supervision for balance   Standing balance support: Bilateral upper extremity supported Standing balance-Leahy Scale: Zero Standing balance comment: completely dependent on external support                            Cognition Arousal/Alertness: Awake/alert Behavior During Therapy: Flat affect Overall Cognitive Status: No family/caregiver present to determine baseline cognitive functioning Area of Impairment: Orientation;Safety/judgement;Attention;Memory;Following commands;Awareness;Problem solving                 Orientation Level: Disoriented to;Place;Time;Situation Current Attention Level: Sustained Memory: Decreased short-term memory Following Commands: Follows one step commands with increased time Safety/Judgement: Decreased awareness of deficits;Decreased awareness of safety Awareness: Intellectual Problem Solving: Slow processing;Requires verbal cues General Comments: PT: "Katherine Cervantes, you were walking your first day in the hospital... five days of laying in bed turning down OOB mobility has caused your pain and weakness" Katherine Cervantes: "No, the hospital is what did this to me." Very poor insight into current condition; difficult to educate/reason with pt      Exercises      General Comments General comments (skin integrity, edema, etc.): Pt  found in urine in the bed - purewick had been collecting, but not everything. Her  bed was visibly wet and had a clear ring where urine had been previously and dried      Pertinent Vitals/Pain Pain Assessment: Faces Pain Score: 10-Worst pain ever Faces Pain Scale: Hurts even more Pain Location: Neck, LUE Pain Descriptors / Indicators: Sore;Discomfort;Grimacing;Guarding;Moaning Pain Intervention(s): Limited activity within patient's tolerance    Home Living Family/patient expects to be discharged to:: Private residence Living Arrangements: Alone Available Help at Discharge: Family;Available PRN/intermittently;Friend(s) Type of Home: House Home Access: Stairs to enter Entrance Stairs-Rails: None Home Layout: One level Home Equipment: Environmental consultant - 2 wheels;Walker - 4 wheels;Bedside commode;Cane - single point;Shower seat;Transport chair      Prior Function Level of Independence: Needs assistance  Gait / Transfers Assistance Needed: Household ambulation with RW. Limited community amb with RW, sometimes uses transport chair ADL's / Homemaking Assistance Needed: Assist from daughter or friend to get in/out of shower and for household tasks. Pt not driving      PT Goals (current goals can now be found in the care plan section) Acute Rehab PT Goals Patient Stated Goal: wants to get back out in the garden PT Goal Formulation: With patient Time For Goal Achievement: 05/28/18 Potential to Achieve Goals: Poor Progress towards PT goals: Not progressing toward goals - comment    Frequency    Min 2X/week      PT Plan Current plan remains appropriate    Co-evaluation PT/OT/SLP Co-Evaluation/Treatment: Yes Reason for Co-Treatment: For patient/therapist safety;To address functional/ADL transfers PT goals addressed during session: Mobility/safety with mobility;Other (comment)(trial maximove lift) OT goals addressed during session: Proper use of Adaptive equipment and DME      AM-PAC PT "6 Clicks" Mobility   Outcome Measure  Help needed turning from your back to your  side while in a flat bed without using bedrails?: Total Help needed moving from lying on your back to sitting on the side of a flat bed without using bedrails?: Total Help needed moving to and from a bed to a chair (including a wheelchair)?: Total Help needed standing up from a chair using your arms (e.g., wheelchair or bedside chair)?: Total Help needed to walk in hospital room?: Total Help needed climbing 3-5 steps with a railing? : Total 6 Click Score: 6    End of Session   Activity Tolerance: Patient limited by pain Patient left: in bed;with call bell/phone within reach;with bed alarm set Nurse Communication: Mobility status;Need for lift equipment PT Visit Diagnosis: Muscle weakness (generalized) (M62.81);Difficulty in walking, not elsewhere classified (R26.2)     Time: 1537-9432 PT Time Calculation (min) (ACUTE ONLY): 14 min  Charges:             Ina Homes, PT, DPT Acute Rehabilitation Services  Pager [phone] Office [phone]  Malachy Chamber 05/20/2018, 6:51 PM

## 2018-05-20 NOTE — Evaluation (Signed)
Occupational Therapy Evaluation Patient Details Name: Katherine Cervantes MRN: [id number] DOB: 12-05-1929 Today's Date: 05/20/2018    History of Present Illness Pt is an 85 y.o. female admitted 05/13/2018 with HF and RLE cellulitis. 4/6 pt now with LUE swelling awaiting ultrasound. PMH includes pacemaker, RA, CAD, DM, afib, depression, noncompliance. Of note, recent admission for heart failure 01/2018 with d/c to SNF until end of 02/2018.   Clinical Impression   PTA Pt was at home getting assist from her daughter for bathing, using a RW in home and transport chair for community trips. Pt today is total A +2 for squat pivot transfer, total A for perianal care, mod A for grooming. Pt will require skilled OT in the acute setting as well as afterwards at the SNF level.    Follow Up Recommendations  SNF;Supervision/Assistance - 24 hour    Equipment Recommendations  Other (comment)(defer to next venue of care)    Recommendations for Other Services Other (comment)(Pallative Consult)     Precautions / Restrictions Precautions Precautions: Fall Restrictions Weight Bearing Restrictions: No      Mobility Bed Mobility Overal bed mobility: Needs Assistance Bed Mobility: Rolling;Supine to Sit Rolling: Total assist;+2 for physical assistance;+2 for safety/equipment   Supine to sit: Max assist;+2 for physical assistance;+2 for safety/equipment     General bed mobility comments: Repeated cues to initiate movement, pt reluctant to move. MaxA to assist BLEs/hips to EOB and trunk elevation, pt able to assist with RUE. +2 assist required for all  Transfers Overall transfer level: Needs assistance Equipment used: Rolling walker (2 wheeled);2 person hand held assist Transfers: Sit to/from [employer] CorporationStand;Squat Pivot Transfers Sit to Stand: Max assist;+2 physical assistance;+2 safety/equipment;From elevated surface(unable to maintain or get fully upright)   Squat pivot transfers: Total assist;+2 physical  assistance;+2 safety/equipment;From elevated surface(from EOB to recliner)     General transfer comment: Pt continues to require maximum education, encouragement    Balance Overall balance assessment: Needs assistance Sitting-balance support: Single extremity supported;No upper extremity supported Sitting balance-Leahy Scale: Fair Sitting balance - Comments: Able to sit EOB without UE support to wash face, supervision for balance   Standing balance support: Bilateral upper extremity supported Standing balance-Leahy Scale: Zero Standing balance comment: completely dependent on external support                           ADL either performed or assessed with clinical judgement   ADL Overall ADL's : Needs assistance/impaired Eating/Feeding: Set up   Grooming: Wash/dry face;Set up;Sitting Grooming Details (indicate cue type and reason): decreased activity tolerance Upper Body Bathing: Maximal assistance;Bed level   Lower Body Bathing: Total assistance   Upper Body Dressing : Maximal assistance   Lower Body Dressing: Total assistance;+2 for physical assistance;+2 for safety/equipment   Toilet Transfer: Total assistance;+2 for physical assistance;+2 for safety/equipment;Squat-pivot   Toileting- Clothing Manipulation and Hygiene: Total assistance;Bed level;+2 for physical assistance;+2 for safety/equipment Toileting - Clothing Manipulation Details (indicate cue type and reason): rolling in the chair with assist from the bed pads       General ADL Comments: decreased cognition, Pt very limited by pain - self limiting     Vision         Perception     Praxis      Pertinent Vitals/Pain Pain Assessment: Faces Faces Pain Scale: Hurts even more Pain Location: Bilateral knees, neck-radiating into L shoulder Pain Descriptors / Indicators: Sore;Discomfort;Grimacing;Guarding Pain Intervention(s): Monitored during session;Repositioned;Heat applied;Premedicated before  session(heat applied to neck)     Hand Dominance Left   Extremity/Trunk Assessment Upper Extremity Assessment Upper Extremity Assessment: Generalized weakness(edema decreasing in LUE (was an infiltrated IV))   Lower Extremity Assessment Lower Extremity Assessment: Defer to PT evaluation   Cervical / Trunk Assessment Cervical / Trunk Assessment: Other exceptions Cervical / Trunk Exceptions: morbid obesity   Communication Communication Communication: No difficulties   Cognition Arousal/Alertness: Awake/alert Behavior During Therapy: Flat affect Overall Cognitive Status: No family/caregiver present to determine baseline cognitive functioning Area of Impairment: Orientation;Safety/judgement;Attention;Memory;Following commands;Awareness;Problem solving                 Orientation Level: Disoriented to;Place;Time;Situation Current Attention Level: Sustained Memory: Decreased short-term memory Following Commands: Follows one step commands with increased time Safety/Judgement: Decreased awareness of deficits;Decreased awareness of safety Awareness: Intellectual Problem Solving: Slow processing;Requires verbal cues General Comments: Im in [place] at [facility] Comments  Pt found in urine in the bed - purewick had been collecting, but not everything. Her bed was visibly wet and had a clear ring where urine had been previously and dried    Exercises     Shoulder Instructions      Home Living Family/patient expects to be discharged to:: Private residence Living Arrangements: Alone Available Help at Discharge: Family;Available PRN/intermittently;Friend(s) Type of Home: House Home Access: Stairs to enter [employer] of Steps: 1 Entrance Stairs-Rails: None Home Layout: One level     Bathroom Shower/Tub: Chief Strategy Officer: Standard     Home Equipment: Environmental consultant - 2 wheels;Walker - 4 wheels;Bedside commode;Cane - single  point;Shower seat;Transport chair          Prior Functioning/Environment Level of Independence: Needs assistance  Gait / Transfers Assistance Needed: Household ambulation with RW. Limited community amb with RW, sometimes uses transport chair ADL's / Homemaking Assistance Needed: Assist from daughter or friend to get in/out of shower and for household tasks. Pt not driving             OT Problem List: Decreased strength;Decreased range of motion;Decreased activity tolerance;Impaired balance (sitting and/or standing);Decreased cognition;Decreased safety awareness;Decreased knowledge of use of DME or AE;Cardiopulmonary status limiting activity;Obesity;Pain      OT Treatment/Interventions: Self-care/ADL training;Therapeutic exercise;Energy conservation;DME and/or AE instruction;Therapeutic activities;Patient/family education;Balance training    OT Goals(Current goals can be found in the care plan section) Acute Rehab OT Goals Patient Stated Goal: wants to get back out in the garden OT Goal Formulation: With patient Time For Goal Achievement: 06/03/18 Potential to Achieve Goals: Fair ADL Goals Pt Will Perform Grooming: with set-up;sitting Pt Will Transfer to Toilet: with mod assist;squat pivot transfer;bedside commode Pt Will Perform Toileting - Clothing Manipulation and hygiene: with max assist;sit to/from stand Additional ADL Goal #1: Pt will perform bed mobility at mod A prior to engaging in ADL  OT Frequency: Min 2X/week   Barriers to D/C: Decreased caregiver support  Pt lives alone       Co-evaluation PT/OT/SLP Co-Evaluation/Treatment: Yes Reason for Co-Treatment: For patient/therapist safety;To address functional/ADL transfers;Necessary to address cognition/behavior during functional activity PT goals addressed during session: Mobility/safety with mobility;Balance;Proper use of DME;Strengthening/ROM OT goals addressed during session: ADL's and self-care;Proper use of  Adaptive equipment and DME;Strengthening/ROM      AM-PAC OT "6 Clicks" Daily Activity     Outcome Measure Help from another person eating meals?: A Little Help from another person taking care of personal grooming?: A Little Help from another person toileting, which includes using toliet,  bedpan, or urinal?: Total Help from another person bathing (including washing, rinsing, drying)?: A Lot Help from another person to put on and taking off regular upper body clothing?: A Lot Help from another person to put on and taking off regular lower body clothing?: Total 6 Click Score: 12   End of Session Equipment Utilized During Treatment: Gait belt;Rolling walker Nurse Communication: Mobility status;Need for lift equipment  Activity Tolerance: Patient limited by pain;Patient limited by fatigue Patient left: in chair;with call bell/phone within reach;with chair alarm set;Other (comment)(lift pad in place)  OT Visit Diagnosis: Unsteadiness on feet (R26.81);Other abnormalities of gait and mobility (R26.89);Muscle weakness (generalized) (M62.81);Other symptoms and signs involving cognitive function;Adult, failure to thrive (R62.7);Pain Pain - Right/Left: Left Pain - part of body: Shoulder;Knee(neck, Bilateral )                Time: [id number] OT Time Calculation (min): 41 min Charges:  OT General Charges $OT Visit: 1 Visit OT Evaluation $OT Eval Moderate Complexity: 1 Mod  Sherryl MangesLaura Gwen Edler OTR/L Acute Rehabilitation Services Pager: [phone] Office: [phone]  Katherine Cervantes 05/20/2018, 4:34 PM

## 2018-05-20 NOTE — Plan of Care (Signed)
  Problem: Health Behavior/Discharge Planning: Goal: Ability to manage health-related needs will improve Outcome: Progressing   Problem: Clinical Measurements: Goal: Ability to maintain clinical measurements within normal limits will improve Outcome: Progressing Goal: Will remain free from infection Outcome: Progressing Goal: Diagnostic test results will improve Outcome: Progressing Goal: Respiratory complications will improve Outcome: Progressing Goal: Cardiovascular complication will be avoided Outcome: Progressing   Problem: Activity: Goal: Risk for activity intolerance will decrease Outcome: Progressing   Problem: Nutrition: Goal: Adequate nutrition will be maintained Outcome: Progressing   Problem: Coping: Goal: Level of anxiety will decrease Outcome: Progressing   Problem: Elimination: Goal: Will not experience complications related to bowel motility Outcome: Progressing Goal: Will not experience complications related to urinary retention Outcome: Progressing   Problem: Pain Managment: Goal: General experience of comfort will improve Outcome: Progressing   Problem: Safety: Goal: Ability to remain free from injury will improve Outcome: Progressing   Problem: Skin Integrity: Goal: Risk for impaired skin integrity will decrease Outcome: Progressing   Problem: Activity: Goal: Capacity to carry out activities will improve Outcome: Progressing   Problem: Cardiac: Goal: Ability to achieve and maintain adequate cardiopulmonary perfusion will improve Outcome: Progressing

## 2018-05-20 NOTE — Progress Notes (Signed)
MD made aware that patient has bed available to her at SNF when patient is ready to d/c.

## 2018-05-20 NOTE — Progress Notes (Signed)
Patient is refusing docusate sodium and metamucil. Patient educated about importance of regular bowel movent however pt still refusing medication. Primary nurse aware.

## 2018-05-20 NOTE — Progress Notes (Signed)
OT Treatment Note:  Clinical Impression: Pt was seen for second session with PT as requested by RN staff for Pt safety with lift equipment. Pt was a total A +2 for all aspects of utilizing the maximove, she was max to total A +2 for bed mobility for rolling to remove the bed pad and once and for perianal care as Pt was once again incontinent of urine. OT will continue to follow acutely.     05/20/18 1800  OT Visit Information  Last OT Received On 05/20/18  Assistance Needed +2  PT/OT/SLP Co-Evaluation/Treatment Yes  Reason for Co-Treatment For patient/therapist safety;To address functional/ADL transfers;Other (comment) (safety with lift)  PT goals addressed during session Proper use of DME  OT goals addressed during session Proper use of Adaptive equipment and DME  History of Present Illness Pt is an 85 y.o. female admitted 05/13/2018 with HF and RLE cellulitis. 4/6 pt now with LUE swelling awaiting ultrasound. PMH includes pacemaker, RA, CAD, DM, afib, depression, noncompliance. Of note, recent admission for heart failure 01/2018 with d/c to SNF until end of 02/2018.  Precautions  Precautions Fall  Precaution Comments incontinent of urine  Pain Assessment  Pain Assessment 0-10  Pain Score 10  Faces Pain Scale 6  Pain Location Bilateral knees, neck-radiating into L shoulder  Pain Descriptors / Indicators Sore;Discomfort;Grimacing;Guarding  Pain Intervention(s) Limited activity within patient's tolerance;Monitored during session;Repositioned;Other (comment) (use of lift)  Cognition  Arousal/Alertness Awake/alert  Behavior During Therapy Flat affect  Overall Cognitive Status No family/caregiver present to determine baseline cognitive functioning  Area of Impairment Orientation;Safety/judgement;Attention;Memory;Following commands;Awareness;Problem solving  Orientation Level Disoriented to;Place;Time;Situation  Current Attention Level Sustained  Memory Decreased short-term memory   Following Commands Follows one step commands with increased time  Safety/Judgement Decreased awareness of deficits;Decreased awareness of safety  Awareness Intellectual  Problem Solving Slow processing;Requires verbal cues  General Comments Im in [place] at [facility]  ADL  Toilet Transfer Total assistance;+2 for physical assistance;+2 for safety/equipment;Squat-pivot;Requires wide/bariatric (requires lift)  Toileting- Clothing Manipulation and Hygiene Total assistance;Bed level;+2 for physical assistance;+2 for safety/equipment  General ADL Comments decreased cognition, Pt very limited by pain - self limiting  Bed Mobility  Overal bed mobility Needs Assistance  Bed Mobility Rolling  General bed mobility comments total A for rolling for lift pad removal - vc throughout  Restrictions  Weight Bearing Restrictions No  Transfers  Overall transfer level Needs assistance  Transfer via Lift Equipment Maximove  General transfer comment +2 total A with maximove  OT - End of Session  Equipment Utilized During Treatment Other (comment) (maximove)  Activity Tolerance Patient limited by pain;Patient limited by fatigue  Patient left in bed;with call bell/phone within reach;with bed alarm set  Nurse Communication Mobility status;Need for lift equipment  OT Assessment/Plan  OT Plan Discharge plan remains appropriate;Frequency remains appropriate  OT Visit Diagnosis Unsteadiness on feet (R26.81);Other abnormalities of gait and mobility (R26.89);Muscle weakness (generalized) (M62.81);Other symptoms and signs involving cognitive function;Adult, failure to thrive (R62.7);Pain  Pain - Right/Left Left  Pain - part of body Shoulder;Knee (neck, bilateral)  OT Frequency (ACUTE ONLY) Min 2X/week  Recommendations for Other Services Other (comment) (Palliative)  Follow Up Recommendations SNF;Supervision/Assistance - 24 hour  OT Equipment Other (comment) (defer to next venue of care)  AM-PAC OT "6  Clicks" Daily Activity Outcome Measure (Version 2)  Help from another person eating meals? 3  Help from another person taking care of personal grooming? 3  Help from another person toileting, which includes using  toliet, bedpan, or urinal? 1  Help from another person bathing (including washing, rinsing, drying)? 2  Help from another person to put on and taking off regular upper body clothing? 2  Help from another person to put on and taking off regular lower body clothing? 1  6 Click Score 12  OT Goal Progression  Progress towards OT goals Progressing toward goals  Acute Rehab OT Goals  Patient Stated Goal wants to get back out in the garden  OT Goal Formulation With patient  Time For Goal Achievement 06/03/18  Potential to Achieve Goals Fair  OT Time Calculation  OT Start Time (ACUTE ONLY) 1707  OT Stop Time (ACUTE ONLY) 1721  OT Time Calculation (min) 14 min  OT General Charges  $OT Visit 1 Visit  OT Treatments  $Therapeutic Activity 8-22 mins   Sherryl Manges OTR/L Acute Rehabilitation Services Pager: [phone] Office: [phone]

## 2018-05-20 NOTE — Progress Notes (Signed)
PT Cancellation Note  Patient Details Name: Baya Pressler MRN: [id number] DOB: 07/12/1929   Cancelled Treatment:    Reason Eval/Treat Not Completed: Other (comment)(REfused due to pt states she just got comfortable. Confused.) Thought she was at home.   Berline Lopes 05/20/2018, 11:54 AM  Rebecah Dangerfield,PT Acute Rehabilitation Services Pager:  [phone]  Office:  [phone]

## 2018-05-21 LAB — BASIC METABOLIC PANEL
Anion gap: 11 (ref 5–15)
BUN: 28 mg/dL — ABNORMAL HIGH (ref 8–23)
CO2: 35 mmol/L — ABNORMAL HIGH (ref 22–32)
Calcium: 8.8 mg/dL — ABNORMAL LOW (ref 8.9–10.3)
Chloride: 89 mmol/L — ABNORMAL LOW (ref 98–111)
Creatinine, Ser: 0.87 mg/dL (ref 0.44–1.00)
GFR calc Af Amer: >60 mL/min (ref >60)
GFR calc non Af Amer: 60 mL/min — ABNORMAL LOW (ref >60)
Glucose, Bld: 124 mg/dL — ABNORMAL HIGH (ref 70–99)
Potassium: 4.2 mmol/L (ref 3.5–5.1)
Sodium: 135 mmol/L (ref 135–145)

## 2018-05-21 NOTE — Plan of Care (Signed)
  Problem: Education: Goal: Knowledge of General Education information will improve Description: Including pain rating scale, medication(s)/side effects and non-pharmacologic comfort measures Outcome: Progressing   Problem: Health Behavior/Discharge Planning: Goal: Ability to manage health-related needs will improve Outcome: Progressing   Problem: Clinical Measurements: Goal: Respiratory complications will improve Outcome: Progressing   

## 2018-05-21 NOTE — Progress Notes (Signed)
Patients condition has declined, pt may need palliative consult.

## 2018-05-21 NOTE — Progress Notes (Signed)
Physical Therapy Treatment Patient Details Name: Katherine Cervantes MRN: [id number] DOB: March 18, 1929 Today's Date: 05/21/2018    History of Present Illness Pt is an 85 y.o. female admitted 05/13/2018 with HF and RLE cellulitis. Pt also with LUE swelling; per cardiology, likely IV infitrated at some point. PMH includes pacemaker, RA, CAD, DM, afib, depression, noncompliance. Of note, recent admission for heart failure 01/2018 with d/c to SNF until end of 02/2018.   PT Comments    Pt declining OOB mobility despite max encouragement; pt reports "I'm apprehensive of the pain." Agreeable to minimal bed level exercises to encourage gentle ROM, especially of neck and LUE. Called pt's son while in room for a "pep talk;" pt was able to speak to him but this did not seem to help. Pt's cognition is misleading as she is able to carry fluent conversation that seems appropriately, but also with poor awareness and problem solving. Pt reports might be agreeable to OOB with maximove later; will follow-up as schedule permits.   Pt continues to state things like, "What's the point?" "I don't think I want this anymore..." Discussed potential for palliative care consult with RN.    Follow Up Recommendations  SNF;Supervision for mobility/OOB     Equipment Recommendations  (TBD)    Recommendations for Other Services (Palliative consult)     Precautions / Restrictions Precautions Precautions: Fall Precaution Comments: incontinent of urine Restrictions Weight Bearing Restrictions: No    Mobility  Bed Mobility                  Transfers                    Ambulation/Gait                 Stairs             Wheelchair Mobility    Modified Rankin (Stroke Patients Only)       Balance                                            Cognition Arousal/Alertness: Awake/alert Behavior During Therapy: Flat affect Overall Cognitive Status: No family/caregiver  present to determine baseline cognitive functioning Area of Impairment: Orientation;Safety/judgement;Attention;Memory;Following commands;Awareness;Problem solving                 Orientation Level: Disoriented to;Place;Time;Situation Current Attention Level: Sustained Memory: Decreased short-term memory Following Commands: Follows one step commands with increased time Safety/Judgement: Decreased awareness of deficits;Decreased awareness of safety Awareness: Intellectual Problem Solving: Slow processing;Requires verbal cues        Exercises Other Exercises Other Exercises: LUE finger flex/ext, wrist flex/ext, elbow flex/ext; shoulder rolls forwards/backwards (L-side limited); neck flex/ext, rotation (very limited towards L-side)    General Comments General comments (skin integrity, edema, etc.): Pt declining OOB mobility. Agreeable to in bed therex (albeit limited by pain). PT called son Maurine Minister [phone]) for "pep talk" as pt has been declining all mobility; pt reports this was good but made her "feel pressure." Significant increased time discussing importance of mobility, pt still talking in circles      Pertinent Vitals/Pain Pain Assessment: Faces Faces Pain Scale: Hurts even more Pain Location: Neck, LUE Pain Descriptors / Indicators: Sore;Discomfort;Grimacing;Guarding;Moaning Pain Intervention(s): Limited activity within patient's tolerance    Home Living  Prior Function            PT Goals (current goals can now be found in the care plan section) Acute Rehab PT Goals Patient Stated Goal: "I don't want any pain" PT Goal Formulation: With patient Time For Goal Achievement: 05/28/18 Potential to Achieve Goals: Poor Progress towards PT goals: Not progressing toward goals - comment    Frequency    Min 2X/week      PT Plan Current plan remains appropriate    Co-evaluation              AM-PAC PT "6 Clicks" Mobility    Outcome Measure  Help needed turning from your back to your side while in a flat bed without using bedrails?: Total Help needed moving from lying on your back to sitting on the side of a flat bed without using bedrails?: Total Help needed moving to and from a bed to a chair (including a wheelchair)?: Total Help needed standing up from a chair using your arms (e.g., wheelchair or bedside chair)?: Total Help needed to walk in hospital room?: Total Help needed climbing 3-5 steps with a railing? : Total 6 Click Score: 6    End of Session   Activity Tolerance: Patient limited by pain Patient left: in bed;with call bell/phone within reach;with bed alarm set Nurse Communication: Mobility status;Need for lift equipment PT Visit Diagnosis: Muscle weakness (generalized) (M62.81);Difficulty in walking, not elsewhere classified (R26.2)     Time: [id number] PT Time Calculation (min) (ACUTE ONLY): 35 min  Charges:  $Therapeutic Exercise: 8-22 mins $Self Care/Home Management: 8-22                    Ina HomesJaclyn Briane Birden, PT, DPT Acute Rehabilitation Services  Pager [phone] Office [phone]  Malachy ChamberJaclyn L Nakeeta Sebastiani 05/21/2018, 10:39 AM

## 2018-05-21 NOTE — Care Management Important Message (Signed)
Important Message  Patient Details  Name: Katherine Cervantes MRN: [id number] Date of Birth: 04-03-1929   Medicare Important Message Given:  Yes    Han Lysne Stefan Church 05/21/2018, 3:39 PM

## 2018-05-21 NOTE — Progress Notes (Signed)
Progress Note  Patient Name: Katherine Cervantes Date of Encounter: 05/21/2018  Primary Cardiologist: Olga Millers, MD   Subjective   Breathing some better  No CP    Inpatient Medications    Scheduled Meds: . amLODipine  5 mg Oral Daily  . apixaban  5 mg Oral BID  . docusate sodium  100 mg Oral BID  . doxycycline  100 mg Oral Q12H  . feeding supplement (ENSURE ENLIVE)  237 mL Oral TID BM  . furosemide  80 mg Intravenous BID  . multivitamin with minerals  1 tablet Oral Daily  . potassium chloride  40 mEq Oral BID  . psyllium  1 packet Oral Daily  . sodium chloride flush  3 mL Intravenous Q12H  . spironolactone  25 mg Oral Daily   Continuous Infusions: . sodium chloride     PRN Meds: sodium chloride, acetaminophen, ondansetron (ZOFRAN) IV, sodium chloride flush, traMADol   Vital Signs    Vitals:   05/20/18 1939 05/21/18 0440 05/21/18 0444 05/21/18 0504  BP: 136/67 (!) 131/34  (!) 136/58  Pulse: 66 (!) 55    Resp: 20 20    Temp: 98.2 F (36.8 C) (!) 97.5 F (36.4 C)    TempSrc: Oral Oral    SpO2: 100% 97%    Weight:   108.4 kg   Height:        Intake/Output Summary (Last 24 hours) at 05/21/2018 0749 Last data filed at 05/21/2018 0432 Gross per 24 hour  Intake 950 ml  Output 3000 ml  Net -2050 ml    Net neg 15.7 L   Last 3 Weights 05/21/2018 05/20/2018 05/19/2018  Weight (lbs) 238 lb 15.7 oz 238 lb 1.6 oz 247 lb 2.2 oz  Weight (kg) 108.4 kg 108 kg 112.1 kg      Telemetry    Atrial fibrillation 70s     Personally Reviewed    Physical Exam   GEN: WD NAD  Obese  Neck: Neck is full   Cardiac: irregular, irreg      Respiratory: CTA; no wheeze GI: Soft, obese, NT, no masses MS:Tr LE  Edema  Skin wrinkled, crhonic stasis changes    LUE  Swollen but less   Erythema improving   (pt reports less tender)  Labs    Chemistry Recent Labs  Lab 05/19/18 0904 05/20/18 1408 05/21/18 0455  NA 134* 133* 135  K 4.1 4.9 4.2  CL 90* 89* 89*  CO2 32 31 35*   GLUCOSE 159* 184* 124*  BUN 24* 31* 28*  CREATININE 1.05* 0.80 0.87  CALCIUM 8.9 8.6* 8.8*  GFRNONAA 48* >60 60*  GFRAA 55* >60 >60  ANIONGAP 12 13 11      Hematology Recent Labs  Lab 05/19/18 1019  WBC 10.5  RBC 4.55  HGB 13.3  HCT 42.2  MCV 92.7  MCH 29.2  MCHC 31.5  RDW 15.9*  PLT 210    BNP No results for input(s): BNP, PROBNP in the last 168 hours.  Patient Profile     85 y.o. female with past medical history of chronic diastolic congestive heart failure, noncompliance, hypertension, permanent atrial fibrillation, prior pacemaker admitted with acute on chronic diastolic congestive heart failure.  Patient not taking medications as instructed. Echo December 2019 showed normal LV function, severe left atrial enlargement, mild right atrial enlargement.  Small pericardial effusion.  Assessment & Plan    1 acute on chronic diastolic CHF  Volume has improved  Since yesterday  Would switch to PO lasix tomorrow. 80 lasix po daily in AM with 20 KCL  2 permanent atrial fibrillation-heart rates are OK   Keep on current meds . CHADSvasc 6  Continue apixaban.  3 noncompliance-longstanding issue.  Discussed 2 G NA and fluid     4 hypertension-BP controlled; .  5 prior pacemaker.  6 hypokalemia- K is 4.2    7  L arm sweling   Doxycycline added for cellulitis   Arm improved     WOuld anticipate d/c to SNF tomorrow        For questions or updates, please contact [facility] Please consult [email or web address] for contact info under        Signed, Dietrich Pates, MD  05/21/2018, 7:49 AM

## 2018-05-22 ENCOUNTER — Encounter (HOSPITAL_COMMUNITY): Payer: Self-pay | Admitting: Cardiology

## 2018-05-22 DIAGNOSIS — M810 Age-related osteoporosis without current pathological fracture: Secondary | ICD-10-CM | POA: Diagnosis not present

## 2018-05-22 DIAGNOSIS — I2510 Atherosclerotic heart disease of native coronary artery without angina pectoris: Secondary | ICD-10-CM | POA: Diagnosis not present

## 2018-05-22 DIAGNOSIS — Z7901 Long term (current) use of anticoagulants: Secondary | ICD-10-CM

## 2018-05-22 DIAGNOSIS — M069 Rheumatoid arthritis, unspecified: Secondary | ICD-10-CM | POA: Diagnosis not present

## 2018-05-22 DIAGNOSIS — E785 Hyperlipidemia, unspecified: Secondary | ICD-10-CM | POA: Diagnosis not present

## 2018-05-22 DIAGNOSIS — I890 Lymphedema, not elsewhere classified: Secondary | ICD-10-CM | POA: Diagnosis not present

## 2018-05-22 DIAGNOSIS — Z9114 Patient's other noncompliance with medication regimen: Secondary | ICD-10-CM | POA: Diagnosis not present

## 2018-05-22 DIAGNOSIS — L0390 Cellulitis, unspecified: Secondary | ICD-10-CM | POA: Diagnosis not present

## 2018-05-22 DIAGNOSIS — R609 Edema, unspecified: Secondary | ICD-10-CM | POA: Diagnosis not present

## 2018-05-22 DIAGNOSIS — Z7401 Bed confinement status: Secondary | ICD-10-CM | POA: Diagnosis not present

## 2018-05-22 DIAGNOSIS — Z950 Presence of cardiac pacemaker: Secondary | ICD-10-CM | POA: Diagnosis not present

## 2018-05-22 DIAGNOSIS — L03115 Cellulitis of right lower limb: Secondary | ICD-10-CM | POA: Diagnosis not present

## 2018-05-22 DIAGNOSIS — I4819 Other persistent atrial fibrillation: Secondary | ICD-10-CM | POA: Diagnosis not present

## 2018-05-22 DIAGNOSIS — I5033 Acute on chronic diastolic (congestive) heart failure: Secondary | ICD-10-CM | POA: Diagnosis not present

## 2018-05-22 DIAGNOSIS — M79602 Pain in left arm: Secondary | ICD-10-CM | POA: Diagnosis not present

## 2018-05-22 DIAGNOSIS — I5032 Chronic diastolic (congestive) heart failure: Secondary | ICD-10-CM | POA: Diagnosis not present

## 2018-05-22 DIAGNOSIS — I779 Disorder of arteries and arterioles, unspecified: Secondary | ICD-10-CM | POA: Diagnosis not present

## 2018-05-22 DIAGNOSIS — M109 Gout, unspecified: Secondary | ICD-10-CM | POA: Diagnosis not present

## 2018-05-22 DIAGNOSIS — I959 Hypotension, unspecified: Secondary | ICD-10-CM | POA: Diagnosis not present

## 2018-05-22 DIAGNOSIS — I509 Heart failure, unspecified: Secondary | ICD-10-CM | POA: Diagnosis not present

## 2018-05-22 DIAGNOSIS — E119 Type 2 diabetes mellitus without complications: Secondary | ICD-10-CM | POA: Diagnosis not present

## 2018-05-22 DIAGNOSIS — M2550 Pain in unspecified joint: Secondary | ICD-10-CM | POA: Diagnosis not present

## 2018-05-22 DIAGNOSIS — I10 Essential (primary) hypertension: Secondary | ICD-10-CM | POA: Diagnosis not present

## 2018-05-22 DIAGNOSIS — I110 Hypertensive heart disease with heart failure: Secondary | ICD-10-CM | POA: Diagnosis not present

## 2018-05-22 DIAGNOSIS — J45909 Unspecified asthma, uncomplicated: Secondary | ICD-10-CM | POA: Diagnosis not present

## 2018-05-22 DIAGNOSIS — L03116 Cellulitis of left lower limb: Secondary | ICD-10-CM | POA: Diagnosis not present

## 2018-05-22 DIAGNOSIS — G4730 Sleep apnea, unspecified: Secondary | ICD-10-CM | POA: Diagnosis not present

## 2018-05-22 DIAGNOSIS — I4892 Unspecified atrial flutter: Secondary | ICD-10-CM | POA: Diagnosis not present

## 2018-05-22 DIAGNOSIS — E8779 Other fluid overload: Secondary | ICD-10-CM | POA: Diagnosis not present

## 2018-05-22 DIAGNOSIS — I872 Venous insufficiency (chronic) (peripheral): Secondary | ICD-10-CM | POA: Diagnosis not present

## 2018-05-22 HISTORY — DX: Long term (current) use of anticoagulants: Z79.01

## 2018-05-22 MED ORDER — PSYLLIUM 95 % PO PACK: 1.0000 | PACK | ORAL | Status: DC | PRN

## 2018-05-22 MED ORDER — TRAMADOL HCL 50 MG PO TABS: 50.0000 mg | ORAL_TABLET | Freq: Four times a day (QID) | ORAL | 1 refills | Status: DC | PRN

## 2018-05-22 MED ORDER — SPIRONOLACTONE 25 MG PO TABS: 25.0000 mg | ORAL_TABLET | Freq: Every day | ORAL | 6 refills | Status: AC

## 2018-05-22 MED ORDER — ADULT MULTIVITAMIN W/MINERALS CH: 1.0000 | ORAL_TABLET | Freq: Every day | ORAL | Status: DC

## 2018-05-22 MED ORDER — ENSURE ENLIVE PO LIQD: 237.0000 mL | Freq: Three times a day (TID) | ORAL | 12 refills | Status: DC

## 2018-05-22 MED ORDER — FUROSEMIDE 80 MG PO TABS: 80.0000 mg | ORAL_TABLET | Freq: Every day | ORAL | Status: DC

## 2018-05-22 MED ORDER — DOXYCYCLINE HYCLATE 100 MG PO TABS: 100.0000 mg | ORAL_TABLET | Freq: Two times a day (BID) | ORAL | 0 refills | Status: AC

## 2018-05-22 MED ORDER — ACETAMINOPHEN 325 MG PO TABS: 650.0000 mg | ORAL_TABLET | ORAL | Status: AC | PRN

## 2018-05-22 MED ORDER — FUROSEMIDE 80 MG PO TABS: 80.0000 mg | ORAL_TABLET | Freq: Every day | ORAL | 6 refills | Status: DC

## 2018-05-22 MED ORDER — POTASSIUM CHLORIDE CRYS ER 20 MEQ PO TBCR: 20.0000 meq | EXTENDED_RELEASE_TABLET | Freq: Every day | ORAL | Status: DC

## 2018-05-22 MED ORDER — DOCUSATE SODIUM 100 MG PO CAPS: 100.0000 mg | ORAL_CAPSULE | Freq: Two times a day (BID) | ORAL | 0 refills | Status: DC

## 2018-05-22 NOTE — Progress Notes (Signed)
Nutrition Follow-up  DOCUMENTATION CODES:   Morbid obesity  INTERVENTION:   -Continue Ensure Enlive po TID, each supplement provides 350 kcal and 20 grams of protein -Continue MVI with minerals daily  NUTRITION DIAGNOSIS:   Inadequate oral intake related to social / environmental circumstances, decreased appetite as evidenced by meal completion < 25%.  Ongoing  GOAL:   Patient will meet greater than or equal to 90% of their needs  Progressing  MONITOR:   PO intake, Supplement acceptance, Labs, Weight trends, Skin, I & O's  REASON FOR ASSESSMENT:   Consult Assessment of nutrition requirement/status  ASSESSMENT:   85 y.o. female with past medical history of chronic diastolic congestive heart failure, noncompliance, hypertension, permanent atrial fibrillation, prior pacemaker admitted with acute on chronic diastolic congestive heart failure.  Patient not taking medications as instructed. Echo December 2019 showed normal LV function, severe left atrial enlargement, mild right atrial enlargement.  Small pericardial effusion.  Reviewed I/O's: -2.3 L x 24 hours and -18 L since admission  UOP: 3.2 L x 24 hours  Spoke with pt, who was minimally interactive with this RD. She reports feeling better, but did not respond to other questions. She was watching a game show at time of visit on TV.   Pt continues with poor oral intake; noted meal completion 25%. Pt is taking her Ensure and MVI.  Plan to d/c to SNF today.   Labs reviewed.   NUTRITION - FOCUSED PHYSICAL EXAM:    Most Recent Value  Orbital Region  No depletion  Upper Arm Region  No depletion  Thoracic and Lumbar Region  No depletion  Buccal Region  No depletion  Temple Region  No depletion  Clavicle Bone Region  No depletion  Clavicle and Acromion Bone Region  No depletion  Scapular Bone Region  No depletion  Dorsal Hand  Mild depletion  Patellar Region  No depletion  Anterior Thigh Region  No depletion   Posterior Calf Region  No depletion  Edema (RD Assessment)  Moderate  Hair  Reviewed  Eyes  Reviewed  Mouth  Reviewed  Skin  Reviewed  Nails  Reviewed       Diet Order:   Diet Order            Diet Heart Room service appropriate? Yes; Fluid consistency: Thin; Fluid restriction: 1500 mL Fluid  Diet effective now              EDUCATION NEEDS:   No education needs have been identified at this time  Skin:  Skin Assessment: Skin Integrity Issues: Skin Integrity Issues:: Other (Comment) Other: serous blister on rt leg; RLE cellulitis; MASD breast, perineum, groin, and abdomen  Last BM:  05/21/18  Height:   Ht Readings from Last 1 Encounters:  05/13/18 5' (1.524 m)    Weight:   Wt Readings from Last 1 Encounters:  05/22/18 105.6 kg    Ideal Body Weight:  45.5 kg  BMI:  Body mass index is 45.47 kg/m.  Estimated Nutritional Needs:   Kcal:  1600-1800  Protein:  90-105 grams  Fluid:  > 1.6 L    Kaamil Morefield A. Mayford Knife, RD, LDN, CDCES Registered Dietitian II Certified Diabetes Care and Education Specialist Pager: [phone] After hours Pager: [phone]

## 2018-05-22 NOTE — Progress Notes (Signed)
Called [facility]- gave report to nurse (300/400 hall).   Will give discharge instructions and prescription to [facility].

## 2018-05-22 NOTE — Social Work (Signed)
Clinical Social Worker facilitated patient discharge including contacting patient family and facility to confirm patient discharge plans.  Clinical information faxed to facility and family agreeable with plan.  CSW arranged ambulance transport via [facility] to [facility] RN to call [phone] and as for 300/400 hall RN to give report prior to discharge.  Clinical Social Worker will sign off for now as social work intervention is no longer needed. Please consult [place] again if new need arises.  Octavio Graves, MSW, [facility] Clinical Social Worker [phone]

## 2018-05-22 NOTE — Progress Notes (Signed)
Progress Note  Patient Name: Katherine Cervantes Date of Encounter: 05/22/2018  Primary Cardiologist: Olga Millers, MD   Subjective   Pt a comrtable in bed   No SOB  N o CP  Inpatient Medications    Scheduled Meds: . amLODipine  5 mg Oral Daily  . apixaban  5 mg Oral BID  . docusate sodium  100 mg Oral BID  . doxycycline  100 mg Oral Q12H  . feeding supplement (ENSURE ENLIVE)  237 mL Oral TID BM  . furosemide  80 mg Intravenous BID  . multivitamin with minerals  1 tablet Oral Daily  . potassium chloride  40 mEq Oral BID  . psyllium  1 packet Oral Daily  . sodium chloride flush  3 mL Intravenous Q12H  . spironolactone  25 mg Oral Daily   Continuous Infusions: . sodium chloride     PRN Meds: sodium chloride, acetaminophen, ondansetron (ZOFRAN) IV, sodium chloride flush, traMADol   Vital Signs    Vitals:   05/21/18 0834 05/21/18 1200 05/21/18 2023 05/22/18 0407  BP: (!) 139/50 125/67 (!) 105/48 (!) 153/66  Pulse: 63 (!) 52 (!) 56 61  Resp:  18 18 18   Temp:  98.1 F (36.7 C) 98.4 F (36.9 C) 98.3 F (36.8 C)  TempSrc:  Oral Oral Oral  SpO2: 97% 97% 98% 94%  Weight:    105.6 kg  Height:        Intake/Output Summary (Last 24 hours) at 05/22/2018 0747 Last data filed at 05/22/2018 0000 Gross per 24 hour  Intake 934 ml  Output 3200 ml  Net -2266 ml    Net neg 19.7  L   Last 3 Weights 05/22/2018 05/21/2018 05/20/2018  Weight (lbs) 232 lb 12.9 oz 238 lb 15.7 oz 238 lb 1.6 oz  Weight (kg) 105.6 kg 108.4 kg 108 kg      Telemetry    Atrial fibrillation 80s     Personally Reviewed    Physical Exam   GEN: WD NAD  Obese  Neck: Neck is full   Cardiac: irregular, irreg      Respiratory: CTA; no wheeze GI: Soft, obese, NT, no masses MS: No signif edema  Skin wrinkled, crhonic stasis changes    LUE  Swollen but less   Erythema continues to improve     (pt reports less tender)  Labs    Chemistry Recent Labs  Lab 05/19/18 0904 05/20/18 1408 05/21/18 0455   NA 134* 133* 135  K 4.1 4.9 4.2  CL 90* 89* 89*  CO2 32 31 35*  GLUCOSE 159* 184* 124*  BUN 24* 31* 28*  CREATININE 1.05* 0.80 0.87  CALCIUM 8.9 8.6* 8.8*  GFRNONAA 48* >60 60*  GFRAA 55* >60 >60  ANIONGAP 12 13 11      Hematology Recent Labs  Lab 05/19/18 1019  WBC 10.5  RBC 4.55  HGB 13.3  HCT 42.2  MCV 92.7  MCH 29.2  MCHC 31.5  RDW 15.9*  PLT 210    BNP No results for input(s): BNP, PROBNP in the last 168 hours.  Patient Profile     85 y.o. female with past medical history of chronic diastolic congestive heart failure, noncompliance, hypertension, permanent atrial fibrillation, prior pacemaker admitted with acute on chronic diastolic congestive heart failure.  Patient not taking medications as instructed. Echo December 2019 showed normal LV function, severe left atrial enlargement, mild right atrial enlargement.  Small pericardial effusion.  Assessment & Plan  1 acute on chronic diastolic CHF  Volume has improved] significantly over the weak  Would switch to PO lasix tomorrow. 80 lasix po daily in AM with 20 KCL  Will need f/u BMET at SNF early next week    2 permanent atrial fibrillation-heart rates are OK   Keep on current meds . CHADSvasc 6  Continue apixaban.  3 noncompliance-longstanding issue.  Discussed 2 G NA and fluid     4 hypertension-BP labile but not too bad    5 prior pacemaker.  6 hypokalemia- repleted     7  L arm sweling   Doxycycline added for cellulitis   Arm improving      WOuld anticipate d/c to SNF today      For questions or updates, please contact [facility] Please consult [email or web address] for contact info under        Signed, Dietrich PatesPaula Jode Lippe, MD  05/22/2018, 7:47 AM

## 2018-05-22 NOTE — Discharge Summary (Addendum)
Discharge Summary    Patient ID: Katherine GumsMary Judith Bubb MRN: [id number]; DOB: [birth year]  Admit date: 05/13/2018 Discharge date: 05/22/2018  Primary Care Provider: Sunnie NielsenAlexander, Natalie, DO  Primary Cardiologist: Olga MillersBrian Crenshaw, MD  Primary Electrophysiologist:  None   Discharge Diagnoses    Principal Problem:   Acute on chronic diastolic CHF (congestive heart failure) [facility](HCC) Active Problems:   Hypertension   Permanent atrial fibrillation   Pacemaker   Arm pain, diffuse, left, due to IV infiltrate.    Anticoagulation adequate   NEEDS BMP on Tuesday   Allergies Allergies  Allergen Reactions  . Aspirin Anaphylaxis  . Other Anaphylaxis  . Penicillins Anaphylaxis    Anaphylaxis  . Codeine Other (See Comments)    SOB Asthmatic issues  . Morphine And Related Other (See Comments)    Anaphylaxis  . Sulfa Antibiotics Rash  . Ether   . Levaquin [Levofloxacin In D5w] Other (See Comments)    Disoriented, Hallucinations  . Metoprolol     Possible  . Prednisone Other (See Comments)    Lethargic, rash  . Procaine     Diagnostic Studies/Procedures    none _____________   History of Present Illness     85 year old female with hx chronic diastolic HF, HTN, Carotid artery disease, persistent a fib/ hx [employer] PPM and noncompliance was admitted 05/14/18 with telehealth visit.   She had previously been  Hospitalized in Dec 2019 with acute on chronic diastolic HF.  She was diuresed 15 L.  D/c'd to SNF and then d/c'd home in 03/2018.   Pt has had depression and stopped taking her meds a month ago,  She began having increased dyspnea and did resume her lasix.  She also was having cellulitis of Rt lower ext.  Her leg has been weeping.  She was sleeping in a recliner due to Dyspnea.  She now wants to live.       She was direct admit to [facility].  Hospital Course     Consultants: wound care      IV diuretics were started.  Lasix 80 mg BID. Her permanent a fib was rate controlled and her apixaban  was resumed.  Her noncompliance was discussed.  PT was consulted and Child psychotherapistocial worker for SNF.    Pt is neg 13,426 ml and wt is down from 117.1 Kg on admit to 105.6 today.  Discharged on Laasix 80 daily and Kdur 20 meq daily.  BNP in a week.   Her depression has returned and she has refused PT at times.    By 05/17/18 she developed elbow pain with no hx of injury, ultram given.  Felt to be IV infiltration.  CBC was normal.  Doxycycline was added for cellulitis.    Pt with poor/inadeguate oral intake,  Ensure added.  Dietician did see as well.   Pt has been seen and evaluated by Dr. Tenny Crawoss and found stable for discharge to SNF.  Pt has episodes of depression and non compliance and appears to be in those mode now.  If no improvement palliative care consult should be evaluated.   At discharge Na 135, K+4.2, BUN 28 Hgb 13.3  Weigh daily and call for wt increase of 3 lbs in a day or 5 lbs in a week. Low salt diet.   _____________  Discharge Vitals Blood pressure (!) 153/50, pulse (!) 55, temperature 98.3 F (36.8 C), temperature source Oral, resp. rate 18, height 5' (1.524 m), weight 105.6 kg, SpO2 98 %.  Filed Weights   05/20/18 0528 05/21/18 0444 05/22/18 0407  Weight: 108 kg 108.4 kg 105.6 kg    Labs & Radiologic Studies    CBC No results for input(s): WBC, NEUTROABS, HGB, HCT, MCV, PLT in the last 72 hours. Basic Metabolic Panel Recent Labs    04/25/92 1408 05/21/18 0455  NA 133* 135  K 4.9 4.2  CL 89* 89*  CO2 31 35*  GLUCOSE 184* 124*  BUN 31* 28*  CREATININE 0.80 0.87  CALCIUM 8.6* 8.8*   Liver Function Tests No results for input(s): AST, ALT, ALKPHOS, BILITOT, PROT, ALBUMIN in the last 72 hours. No results for input(s): LIPASE, AMYLASE in the last 72 hours. Cardiac Enzymes No results for input(s): CKTOTAL, CKMB, CKMBINDEX, TROPONINI in the last 72 hours. BNP Invalid input(s): POCBNP D-Dimer No results for input(s): DDIMER in the last 72 hours. Hemoglobin A1C No results  for input(s): HGBA1C in the last 72 hours. Fasting Lipid Panel No results for input(s): CHOL, HDL, LDLCALC, TRIG, CHOLHDL, LDLDIRECT in the last 72 hours. Thyroid Function Tests No results for input(s): TSH, T4TOTAL, T3FREE, THYROIDAB in the last 72 hours.  Invalid input(s): FREET3 _____________  No results found. Disposition   Pt is being discharged home today in good condition.  Follow-up Plans & Appointments   Heart Healthy low sodium diet  Weigh daily and call the office for wt gain 3 lbs in a day or 5 lbs in a week  Physical therapy.   BMP in 1 week results to Dr. Magdalene River information for follow-up providers    Lewayne Bunting, MD Follow up on 06/03/2018.   Specialty:  Cardiology Why:  at 2:20 pm with Corine Shelter, PA  Contact information: [address] [place] [address] [phone]            Contact information for after-discharge care    Destination    [facility] .   Service:  Skilled Nursing Contact information: [address] [place] [address] [phone]                   Discharge Medications   Allergies as of 05/22/2018      Reactions   Aspirin Anaphylaxis   Other Anaphylaxis   Penicillins Anaphylaxis   Anaphylaxis   Codeine Other (See Comments)   SOB Asthmatic issues   Morphine And Related Other (See Comments)   Anaphylaxis   Sulfa Antibiotics Rash   Ether    Levaquin [levofloxacin In D5w] Other (See Comments)   Disoriented, Hallucinations   Metoprolol    Possible   Prednisone Other (See Comments)   Lethargic, rash   Procaine       Medication List    STOP taking these medications   traMADol-acetaminophen 37.5-325 MG tablet Commonly known as:  ULTRACET     TAKE these medications   acetaminophen 325 MG tablet Commonly known as:  TYLENOL Take 2 tablets (650 mg total) by mouth every 4 (four) hours as needed for headache or mild pain.    amLODipine 5 MG tablet Commonly known as:  NORVASC Take 1 tablet (5 mg total) by mouth daily.   apixaban 5 MG Tabs tablet Commonly known as:  ELIQUIS Take 1 tablet (5 mg total) by mouth 2 (two) times daily.   docusate sodium 100 MG capsule Commonly known as:  COLACE Take 1 capsule (100 mg total) by mouth 2 (two) times daily.   doxycycline 100  MG tablet Commonly known as:  VIBRA-TABS Take 1 tablet (100 mg total) by mouth every 12 (twelve) hours for 3 days.   feeding supplement (ENSURE ENLIVE) Liqd Take 237 mLs by mouth 3 (three) times daily between meals.   furosemide 80 MG tablet Commonly known as:  LASIX Take 1 tablet (80 mg total) by mouth daily. Start taking on:  May 23, 2018 What changed:    medication strength  how much to take  how to take this  when to take this  additional instructions   multivitamin with minerals Tabs tablet Take 1 tablet by mouth daily. Start taking on:  May 23, 2018   neomycin-bacitracin-polymyxin ointment Commonly known as:  NEOSPORIN Apply 1 application topically as needed for wound care.   nitroGLYCERIN 0.4 MG SL tablet Commonly known as:  NITROSTAT Place 1 tablet (0.4 mg total) under the tongue every 5 (five) minutes as needed for chest pain (MAX 3 TABLETS).   potassium chloride SA 20 MEQ tablet Commonly known as:  K-DUR,KLOR-CON Take 1 tablet (20 mEq total) by mouth daily.   psyllium 95 % Pack Commonly known as:  HYDROCIL/METAMUCIL Take 1 packet by mouth as needed for mild constipation.   spironolactone 25 MG tablet Commonly known as:  ALDACTONE Take 1 tablet (25 mg total) by mouth daily. Start taking on:  May 23, 2018   traMADol 50 MG tablet Commonly known as:  ULTRAM Take 1 tablet (50 mg total) by mouth every 6 (six) hours as needed for moderate pain.        Acute coronary syndrome (MI, NSTEMI, STEMI, etc) this admission?: No.    Outstanding Labs/Studies   Bmp IN 1 week    FOLLOW UP TELEHEALTH VISIT  PT WILL BE AT [facility],  [phone]   Duration of Discharge Encounter   Greater than 30 minutes including physician time.  Signed, Nada Boozer, NP 05/22/2018, 11:08 AM

## 2018-05-22 NOTE — TOC Transition Note (Signed)
Transition of Care [facility]) - CM/SW Discharge Note   Patient Details  Name: Katherine Cervantes MRN: [id number] Date of Birth: 18-Apr-1929  Transition of Care [facility]) CM/SW Contact:  Doy Hutching, LCSWA Phone Number: 05/22/2018, 11:05 AM   Clinical Narrative:    Pt approved to go to [facility] SNF. Alerted Misty with admissions that pt is discharging. Need orders, d/c summary, and signed prescriptions for controlled substances (bedside RN aware). When complete will arrange [facility].   Final next level of care: Skilled Nursing Facility Barriers to Discharge: Barriers Resolved   Patient Goals and CMS Choice Patient states their goals for this hospitalization and ongoing recovery are:: to get better CMS Medicare.gov Compare Post Acute Care list provided to:: Patient Choice offered to / list presented to : Adult Children, Patient  Discharge Placement   Existing PASRR number confirmed : 05/14/18          Patient chooses bed at: Other - please specify in the comment section below: Patient to be transferred to facility by: [facility] Name of family member notified: pt daughter Titania Yturralde Patient and family notified of of transfer: 05/22/18  Discharge Plan and Services   Discharge Planning Services: CM Consult Post Acute Care Choice: Skilled Nursing Facility                    Social Determinants of Health (SDOH) Interventions     Readmission Risk Interventions No flowsheet data found.

## 2018-05-26 ENCOUNTER — Ambulatory Visit: Payer: Medicare Other | Admitting: Cardiology

## 2018-05-26 DIAGNOSIS — I5033 Acute on chronic diastolic (congestive) heart failure: Secondary | ICD-10-CM | POA: Diagnosis not present

## 2018-05-26 DIAGNOSIS — E8779 Other fluid overload: Secondary | ICD-10-CM | POA: Diagnosis not present

## 2018-05-26 DIAGNOSIS — I10 Essential (primary) hypertension: Secondary | ICD-10-CM | POA: Diagnosis not present

## 2018-05-26 DIAGNOSIS — I4819 Other persistent atrial fibrillation: Secondary | ICD-10-CM | POA: Diagnosis not present

## 2018-05-29 ENCOUNTER — Telehealth: Payer: Medicare Other | Admitting: Cardiovascular Disease

## 2018-06-02 ENCOUNTER — Telehealth: Payer: Self-pay | Admitting: Cardiology

## 2018-06-02 NOTE — Telephone Encounter (Signed)
No answer unable to leave msg 06/02/18 AF

## 2018-06-03 ENCOUNTER — Encounter: Payer: Self-pay | Admitting: Cardiology

## 2018-06-03 ENCOUNTER — Telehealth: Payer: Medicare Other | Admitting: Cardiology

## 2018-06-03 ENCOUNTER — Telehealth: Payer: Self-pay | Admitting: Osteopathic Medicine

## 2018-06-03 ENCOUNTER — Telehealth: Payer: Self-pay | Admitting: Cardiology

## 2018-06-03 DIAGNOSIS — I2510 Atherosclerotic heart disease of native coronary artery without angina pectoris: Secondary | ICD-10-CM

## 2018-06-03 DIAGNOSIS — I2583 Coronary atherosclerosis due to lipid rich plaque: Principal | ICD-10-CM

## 2018-06-03 DIAGNOSIS — I509 Heart failure, unspecified: Secondary | ICD-10-CM

## 2018-06-03 NOTE — Telephone Encounter (Signed)
Multiple attempts made to contact the patient and the nursing home for tele health visit were unsuccessful.  She should be seen in the office when it opens after COVID-19 restrictions lifted.   Corine Shelter PA-C 06/03/2018 3:42 PM

## 2018-06-03 NOTE — Telephone Encounter (Signed)
Verbal is ok for whatveer they need

## 2018-06-03 NOTE — Telephone Encounter (Signed)
Received a voice mail from [facility], requesting a referral from Dr. Lyn Hollingshead. Any questions give them a call back at [phone].

## 2018-06-03 NOTE — Telephone Encounter (Signed)
Attempted to contact [facility], no answer. Will attempt to callback tomorrow to see what they need.

## 2018-06-04 NOTE — Telephone Encounter (Signed)
Called [facility] again. They just need a new referral for eval and treat. Pt used home health aid, PT, and OT last time she was with the company (Feb 2020).   Referral to be faxed to: [phone].

## 2018-06-05 ENCOUNTER — Telehealth: Payer: Self-pay | Admitting: Cardiovascular Disease

## 2018-06-05 NOTE — Telephone Encounter (Signed)
New Message:    Pt's daughter would like for you to call her this afternoon after 5:15 or Monday morning. She wants to know what is the amount of weight pt should gain, before giving her a second Lasix pill?

## 2018-06-05 NOTE — Telephone Encounter (Signed)
Spoke with pt dtr, aware 3 lbs overnight or 5 lbs in one week. Fluid restrictions and sodium restrictions discussed with the daughter.

## 2018-06-05 NOTE — Telephone Encounter (Signed)
Order is in, routing to referral coordinator Also patient has had multiple hospitalizations but hasn't been seen by me, needs follow up visit, I don't know if home health will approve referral w/o face to face visit.

## 2018-06-05 NOTE — Telephone Encounter (Signed)
Sending this to Victorino Dike as Arline Asp is off today

## 2018-06-08 NOTE — Progress Notes (Signed)
Multiple attempts made to contact the patient and the nursing home for tele health visit were unsuccessful.  She should be seen in the office when it opens after COVID-19 restrictions lifted.   Marionette Meskill PA-C 06/03/2018 3:42 PM  

## 2018-06-08 NOTE — Telephone Encounter (Signed)
Sent - CF

## 2018-06-10 ENCOUNTER — Telehealth: Payer: Self-pay | Admitting: Cardiology

## 2018-06-10 DIAGNOSIS — I5033 Acute on chronic diastolic (congestive) heart failure: Secondary | ICD-10-CM | POA: Diagnosis not present

## 2018-06-10 DIAGNOSIS — I872 Venous insufficiency (chronic) (peripheral): Secondary | ICD-10-CM | POA: Diagnosis not present

## 2018-06-10 DIAGNOSIS — I509 Heart failure, unspecified: Secondary | ICD-10-CM | POA: Diagnosis not present

## 2018-06-10 DIAGNOSIS — I890 Lymphedema, not elsewhere classified: Secondary | ICD-10-CM | POA: Diagnosis not present

## 2018-06-10 NOTE — Telephone Encounter (Signed)
LVM at the facility the patient is at for transportation to call and schedule an appointment in August with Dr. [name].

## 2018-06-11 ENCOUNTER — Telehealth: Payer: Self-pay | Admitting: Cardiovascular Disease

## 2018-06-11 NOTE — Telephone Encounter (Signed)
Set up follow visit as requested

## 2018-06-11 NOTE — Telephone Encounter (Signed)
New Message   Patient getting out of rehab today please call daughter to help setup for virtual visit.

## 2018-06-12 DIAGNOSIS — I2510 Atherosclerotic heart disease of native coronary artery without angina pectoris: Secondary | ICD-10-CM | POA: Diagnosis not present

## 2018-06-12 DIAGNOSIS — I5032 Chronic diastolic (congestive) heart failure: Secondary | ICD-10-CM | POA: Diagnosis not present

## 2018-06-12 DIAGNOSIS — I872 Venous insufficiency (chronic) (peripheral): Secondary | ICD-10-CM | POA: Diagnosis not present

## 2018-06-12 DIAGNOSIS — J45909 Unspecified asthma, uncomplicated: Secondary | ICD-10-CM | POA: Diagnosis not present

## 2018-06-12 DIAGNOSIS — I110 Hypertensive heart disease with heart failure: Secondary | ICD-10-CM | POA: Diagnosis not present

## 2018-06-12 DIAGNOSIS — E1151 Type 2 diabetes mellitus with diabetic peripheral angiopathy without gangrene: Secondary | ICD-10-CM | POA: Diagnosis not present

## 2018-06-15 DIAGNOSIS — J45909 Unspecified asthma, uncomplicated: Secondary | ICD-10-CM | POA: Diagnosis not present

## 2018-06-15 DIAGNOSIS — I5032 Chronic diastolic (congestive) heart failure: Secondary | ICD-10-CM | POA: Diagnosis not present

## 2018-06-15 DIAGNOSIS — I2510 Atherosclerotic heart disease of native coronary artery without angina pectoris: Secondary | ICD-10-CM | POA: Diagnosis not present

## 2018-06-15 DIAGNOSIS — E1151 Type 2 diabetes mellitus with diabetic peripheral angiopathy without gangrene: Secondary | ICD-10-CM | POA: Diagnosis not present

## 2018-06-15 DIAGNOSIS — I872 Venous insufficiency (chronic) (peripheral): Secondary | ICD-10-CM | POA: Diagnosis not present

## 2018-06-15 DIAGNOSIS — I110 Hypertensive heart disease with heart failure: Secondary | ICD-10-CM | POA: Diagnosis not present

## 2018-06-17 ENCOUNTER — Telehealth: Payer: Self-pay | Admitting: Cardiovascular Disease

## 2018-06-17 ENCOUNTER — Telehealth: Payer: Self-pay | Admitting: Cardiology

## 2018-06-17 DIAGNOSIS — E1151 Type 2 diabetes mellitus with diabetic peripheral angiopathy without gangrene: Secondary | ICD-10-CM | POA: Diagnosis not present

## 2018-06-17 DIAGNOSIS — I872 Venous insufficiency (chronic) (peripheral): Secondary | ICD-10-CM | POA: Diagnosis not present

## 2018-06-17 DIAGNOSIS — I5032 Chronic diastolic (congestive) heart failure: Secondary | ICD-10-CM | POA: Diagnosis not present

## 2018-06-17 DIAGNOSIS — J45909 Unspecified asthma, uncomplicated: Secondary | ICD-10-CM | POA: Diagnosis not present

## 2018-06-17 DIAGNOSIS — I110 Hypertensive heart disease with heart failure: Secondary | ICD-10-CM | POA: Diagnosis not present

## 2018-06-17 DIAGNOSIS — I2510 Atherosclerotic heart disease of native coronary artery without angina pectoris: Secondary | ICD-10-CM | POA: Diagnosis not present

## 2018-06-17 NOTE — Telephone Encounter (Signed)
Spoke w/ pt and explained to her that she is overdue to have her PPM checked. Pt was a little confused about follow up w/ Dr. [name] and Dr. Ladona Ridgel. I informed her that she would need to follow up w/ both MD's b/c they take care separate issues w/ the heart. Pt verbalized understanding. Informed her a scheduler will call to schedule an appt for after COVID-19. Pt verbalized understanding. Device is remote capable.

## 2018-06-18 DIAGNOSIS — J45909 Unspecified asthma, uncomplicated: Secondary | ICD-10-CM | POA: Diagnosis not present

## 2018-06-18 DIAGNOSIS — I5032 Chronic diastolic (congestive) heart failure: Secondary | ICD-10-CM | POA: Diagnosis not present

## 2018-06-18 DIAGNOSIS — I2510 Atherosclerotic heart disease of native coronary artery without angina pectoris: Secondary | ICD-10-CM | POA: Diagnosis not present

## 2018-06-18 DIAGNOSIS — I872 Venous insufficiency (chronic) (peripheral): Secondary | ICD-10-CM | POA: Diagnosis not present

## 2018-06-18 DIAGNOSIS — I110 Hypertensive heart disease with heart failure: Secondary | ICD-10-CM | POA: Diagnosis not present

## 2018-06-18 DIAGNOSIS — E1151 Type 2 diabetes mellitus with diabetic peripheral angiopathy without gangrene: Secondary | ICD-10-CM | POA: Diagnosis not present

## 2018-06-18 NOTE — Telephone Encounter (Signed)
Daughters smartphone-[phone]/ consent/ my chart/ pre reg completed

## 2018-06-19 ENCOUNTER — Telehealth (INDEPENDENT_AMBULATORY_CARE_PROVIDER_SITE_OTHER): Payer: Medicare Other | Admitting: Cardiovascular Disease

## 2018-06-19 ENCOUNTER — Encounter: Payer: Self-pay | Admitting: Cardiovascular Disease

## 2018-06-19 VITALS — Ht 60.0 in | Wt 213.0 lb

## 2018-06-19 DIAGNOSIS — I482 Chronic atrial fibrillation, unspecified: Secondary | ICD-10-CM | POA: Diagnosis not present

## 2018-06-19 DIAGNOSIS — I2583 Coronary atherosclerosis due to lipid rich plaque: Secondary | ICD-10-CM | POA: Diagnosis not present

## 2018-06-19 DIAGNOSIS — Z7901 Long term (current) use of anticoagulants: Secondary | ICD-10-CM | POA: Diagnosis not present

## 2018-06-19 DIAGNOSIS — R600 Localized edema: Secondary | ICD-10-CM

## 2018-06-19 DIAGNOSIS — Z5181 Encounter for therapeutic drug level monitoring: Secondary | ICD-10-CM | POA: Diagnosis not present

## 2018-06-19 DIAGNOSIS — E1151 Type 2 diabetes mellitus with diabetic peripheral angiopathy without gangrene: Secondary | ICD-10-CM | POA: Diagnosis not present

## 2018-06-19 DIAGNOSIS — I5032 Chronic diastolic (congestive) heart failure: Secondary | ICD-10-CM | POA: Diagnosis not present

## 2018-06-19 DIAGNOSIS — I2510 Atherosclerotic heart disease of native coronary artery without angina pectoris: Secondary | ICD-10-CM

## 2018-06-19 DIAGNOSIS — I872 Venous insufficiency (chronic) (peripheral): Secondary | ICD-10-CM | POA: Diagnosis not present

## 2018-06-19 DIAGNOSIS — I110 Hypertensive heart disease with heart failure: Secondary | ICD-10-CM | POA: Diagnosis not present

## 2018-06-19 DIAGNOSIS — J45909 Unspecified asthma, uncomplicated: Secondary | ICD-10-CM | POA: Diagnosis not present

## 2018-06-19 LAB — BASIC METABOLIC PANEL
BUN/Creatinine Ratio: 36 — ABNORMAL HIGH (ref 12–28)
BUN: 36 mg/dL — ABNORMAL HIGH (ref 8–27)
CO2: 23 mmol/L (ref 20–29)
Calcium: 10.1 mg/dL (ref 8.7–10.3)
Chloride: 95 mmol/L — ABNORMAL LOW (ref 96–106)
Creatinine, Ser: 0.99 mg/dL (ref 0.57–1.00)
GFR calc Af Amer: 59 mL/min/{1.73_m2} — ABNORMAL LOW (ref >59)
GFR calc non Af Amer: 51 mL/min/{1.73_m2} — ABNORMAL LOW (ref >59)
Glucose: 117 mg/dL — ABNORMAL HIGH (ref 65–99)
Potassium: 3.8 mmol/L (ref 3.5–5.2)
Sodium: 137 mmol/L (ref 134–144)

## 2018-06-19 NOTE — Patient Instructions (Addendum)
Medication Instructions:  Your physician recommends that you continue on your current medications as directed. Please refer to the Current Medication list given to you today  Lab work: BMET SOON   If you have labs (blood work) drawn today and your tests are completely normal, you will receive your results only by: Marland Kitchen MyChart Message (if you have MyChart) OR . A paper copy in the mail If you have any lab test that is abnormal or we need to change your treatment, we will call you to review the results.  Testing/Procedures: NONE  Follow-Up: Your physician wants you to follow-up in: 3 MONTHS WITH PA/NP You will receive a reminder letter in the mail two months in advance. If you don't receive a letter, please call our office to schedule the follow-up appointment.  At [facility], you and your health needs are our priority.  As part of our continuing mission to provide you with exceptional heart care, we have created designated Provider Care Teams.  These Care Teams include your primary Cardiologist (physician) and Advanced Practice Providers (APPs -  Physician Assistants and Nurse Practitioners) who all work together to provide you with the care you need, when you need it. You will need a follow up appointment in 6 months.  Please call our office 2 months in advance to schedule this appointment.  You may see DR [facility]  or one of the following Advanced Practice Providers on your designated Care Team:   Corine Shelter, PA-C Judy Pimple, [place] . Marjie Skiff, PA-C

## 2018-06-19 NOTE — Progress Notes (Signed)
Virtual Visit via Video Note    Evaluation Performed:  Follow-up visit  This visit type was conducted due to national recommendations for restrictions regarding the COVID-19 Pandemic (e.g. social distancing).  This format is felt to be most appropriate for this patient at this time.  All issues noted in this document were discussed and addressed.  No physical exam was performed (except for noted visual exam findings with Video Visits).  Please refer to the patient's chart (Katherine Cervantes message for video visits and phone note for telephone visits) for the patient's consent to telehealth for Katherine Cervantes.  Date:  06/19/2018   ID:  Katherine Cervantes, DOB 04/01/1929, MRN [id number]  Patient Location:  Home    Provider location:   office  PCP:  Sunnie NielsenAlexander, Natalie, DO  Cardiologist:  Olga MillersBrian Crenshaw, MD   Electrophysiologist:  Dr. Ladona Ridgelaylor   Chief Complaint:  Edema, shortness of breath  History of Present Illness:    Katherine Cervantes is a 85 y.o. female who presents via audio/video conferencing for a telehealth visit today.    Katherine Cervantes is an [age]F with chronic diastolic heart failure, hypertension, carotid artery disease, persistent atrial fibrillation, status post [facility] [employer] pacemaker, and noncompliance here for follow-up.  She was admitted 01/2018 with acute on chronic diastolic heart failure in the setting of noncompliance.  Prior to hospitalization she stopped taking all of her medications.  She required diuresis with IV Lasix and diuresed 15 L.  She was subsequently discharged to a skilled nursing facility.  During that hospitalization her heart rates were well-controlled but she remained in atrial fibrillation.  She was started on amlodipine for poorly controlled hypertension.  She followed up with Corine ShelterLuke Kilroy, PA-CM 02/2018.  It was noted at that time that she had a lot of social stressors and felt as though she just did not want to go on living.  From a cardiac standpoint she was stable.   Katherine Cervantes was discharged from the rehab 03/2018.  After being home she has felt like giving up.  She stopped taking her medication for over a month.  Her lasix was last filled 03/10/18.  Her prescription bottle was for 20 mg of Lasix.  At her last appointment she also had lower extremity similar cellulitis.  She was referred to the hospital where she was diuresed 13 L.  Her discharge weight was 105.6 kg down from 117.1 kg on admission.  She was discharged on Lasix 80 mg daily and went to a SNF.  Since being discharged from the hospital she has been doing well.  Her weight today is 213, which is stable from discharge.  She was discharged from rehab last week and has been getting home PT and speech.  She is still sleeping in the recliner so that she can elevate her legs and for comfort.  Her breathing has been stable.  She doesn't check her BP at home.  The patient does not have symptoms concerning for COVID-19 infection (fever, chills, cough, or new shortness of breath).    Prior CV studies:   The following studies were reviewed today:  Echo 01/31/18 Study Conclusions  - Left ventricle: The cavity size was normal. Wall thickness was increased in a pattern of moderate LVH. Systolic function was normal. The estimated ejection fraction was in the range of 60% to 65%. Wall motion was normal; there were no regional wall motion abnormalities. - Mitral valve: Severely calcified annulus. - Left atrium: The atrium was severely dilated. -  Right atrium: The atrium was mildly dilated. - Pulmonary arteries: Systolic pressure was mildly increased. PA peak pressure: 44 mm Hg (S). - Pericardium, extracardiac: A small pericardial effusion was identified.  Impressions:  - Normal LV systolic function; moderate LVH; biatrial enlargement; trace TR with mild pulmonary hypertension; small pericardial effusion.  Past Medical History:  Diagnosis Date  . Anticoagulation adequate 05/22/2018  .  Asthma   . Atrial fibrillation (HCC) 09/27/2011  . CAD (coronary artery disease) 09/27/2011  . Diabetes mellitus without complication (HCC)   . Dyslipidemia 09/27/2011  . History of vasculitis 09/27/2011  . Hypertension 09/27/2011  . Osteoporosis 09/27/2011  . Pacemaker   . PUD (peptic ulcer disease)   . Rheumatoid arthritis(714.0) 09/27/2011   Past Surgical History:  Procedure Laterality Date  . ABDOMINAL HYSTERECTOMY    . ANKLE SURGERY    . APPENDECTOMY    . Carpal tunnel    . CHOLECYSTECTOMY    . PARTIAL GASTRECTOMY       Current Meds  Medication Sig  . acetaminophen (TYLENOL) 325 MG tablet Take 2 tablets (650 mg total) by mouth every 4 (four) hours as needed for headache or mild pain.  Marland Kitchen. amLODipine (NORVASC) 5 MG tablet Take 1 tablet (5 mg total) by mouth daily.  Marland Kitchen. apixaban (ELIQUIS) 5 MG TABS tablet Take 1 tablet (5 mg total) by mouth 2 (two) times daily.  . furosemide (LASIX) 80 MG tablet Take 1 tablet (80 mg total) by mouth daily.  Marland Kitchen. neomycin-bacitracin-polymyxin (NEOSPORIN) ointment Apply 1 application topically as needed for wound care.  . nitroGLYCERIN (NITROSTAT) 0.4 MG SL tablet Place 1 tablet (0.4 mg total) under the tongue every 5 (five) minutes as needed for chest pain (MAX 3 TABLETS).  . potassium chloride SA (K-DUR,KLOR-CON) 20 MEQ tablet Take 1 tablet (20 mEq total) by mouth daily.  . psyllium (HYDROCIL/METAMUCIL) 95 % PACK Take 1 packet by mouth as needed for mild constipation.  Marland Kitchen. spironolactone (ALDACTONE) 25 MG tablet Take 1 tablet (25 mg total) by mouth daily.     Allergies:   Aspirin; Other; Penicillins; Codeine; Morphine and related; Sulfa antibiotics; Ether; Levaquin [levofloxacin in d5w]; Metoprolol; Prednisone; and Procaine   Social History   Tobacco Use  . Smoking status: Never Smoker  . Smokeless tobacco: Never Used  Substance Use Topics  . Alcohol use: Yes    Alcohol/week: 0.0 standard drinks    Comment: Occasional  . Drug use: No     Family Hx:  The patient's family history includes Cancer in her brother and brother; Hypertension in her mother.  ROS:   Please see the history of present illness.    All other systems reviewed and are negative.   Labs/Other Tests and Data Reviewed:    Recent Labs: 05/13/2018: ALT 12; B Natriuretic Peptide 169.6 05/19/2018: Hemoglobin 13.3; Platelets 210 05/21/2018: BUN 28; Creatinine, Ser 0.87; Potassium 4.2; Sodium 135   Recent Lipid Panel Lab Results  Component Value Date/Time   CHOL 157 12/24/2017 09:15 AM   TRIG 78 12/24/2017 09:15 AM   HDL 63 12/24/2017 09:15 AM   CHOLHDL 2.5 12/24/2017 09:15 AM   CHOLHDL 3.2 10/13/2015 11:15 AM   LDLCALC 78 12/24/2017 09:15 AM    Wt Readings from Last 3 Encounters:  06/19/18 213 lb (96.6 kg)  06/03/18 213 lb (96.6 kg)  05/22/18 232 lb 12.9 oz (105.6 kg)     Objective:    Ht 5' (1.524 m)   Wt 213 lb (96.6 kg)   BMI  41.60 kg/m  GENERAL: Chronically ill-appearing.  No acute distress. HEENT: Pupils equal round.  Oral mucosa unremarkable NECK:  Unable to assess JVD 2/2 poor image quality. No visible thyromegaly EXT:  No edema.  No cyanosis no clubbing SKIN:  No erythema or rashes NEURO:  Speech fluent.  Cranial nerves grossly intact.  Moves all 4 extremities freely PSYCH:  Cognitively intact, oriented to person place and time   ASSESSMENT & PLAN:    # Chronic diastolic heart failure: # Non-compliance: Doing well since discharge.  She is euvolemic and weight is stable.  She is trying to watch her salt intake and taking medication as prescribed.  Continue amlodipine, spirinolactone and lasix.  She will go for BMP today.  # Persistent atrial fibrillation: Presumably she is still in atrial fibrillation.  Continue Eliquis. Rates were controlled in the hospital.    COVID-19 Education: The signs and symptoms of COVID-19 were discussed with the patient and how to seek care for testing (follow up with PCP or arrange E-visit).  The importance of social  distancing was discussed today.  Patient Risk:   After full review of this patient's clinical status, I feel that they are at least moderate risk at this time.  Time:   Today, I have spent 22 minutes with the patient with telehealth technology discussing heart failure.     Medication Adjustments/Labs and Tests Ordered: Current medicines are reviewed at length with the patient today.  Concerns regarding medicines are outlined above.    Tests Ordered: Orders Placed This Encounter  Procedures  . Basic metabolic panel   Medication Changes: No orders of the defined types were placed in this encounter.   Disposition:  Follow up with APP in 3 months.  F/u with Maryjean Corpening C. Duke Salvia, MD, [facility] in 6 months   Signed, Chilton Si, MD  06/19/2018 9:58 AM    [facility]

## 2018-06-22 DIAGNOSIS — E1151 Type 2 diabetes mellitus with diabetic peripheral angiopathy without gangrene: Secondary | ICD-10-CM | POA: Diagnosis not present

## 2018-06-22 DIAGNOSIS — I2510 Atherosclerotic heart disease of native coronary artery without angina pectoris: Secondary | ICD-10-CM | POA: Diagnosis not present

## 2018-06-22 DIAGNOSIS — I110 Hypertensive heart disease with heart failure: Secondary | ICD-10-CM | POA: Diagnosis not present

## 2018-06-22 DIAGNOSIS — J45909 Unspecified asthma, uncomplicated: Secondary | ICD-10-CM | POA: Diagnosis not present

## 2018-06-22 DIAGNOSIS — I5032 Chronic diastolic (congestive) heart failure: Secondary | ICD-10-CM | POA: Diagnosis not present

## 2018-06-22 DIAGNOSIS — I872 Venous insufficiency (chronic) (peripheral): Secondary | ICD-10-CM | POA: Diagnosis not present

## 2018-06-23 DIAGNOSIS — I5032 Chronic diastolic (congestive) heart failure: Secondary | ICD-10-CM | POA: Diagnosis not present

## 2018-06-23 DIAGNOSIS — I110 Hypertensive heart disease with heart failure: Secondary | ICD-10-CM | POA: Diagnosis not present

## 2018-06-23 DIAGNOSIS — I872 Venous insufficiency (chronic) (peripheral): Secondary | ICD-10-CM | POA: Diagnosis not present

## 2018-06-23 DIAGNOSIS — E1151 Type 2 diabetes mellitus with diabetic peripheral angiopathy without gangrene: Secondary | ICD-10-CM | POA: Diagnosis not present

## 2018-06-23 DIAGNOSIS — J45909 Unspecified asthma, uncomplicated: Secondary | ICD-10-CM | POA: Diagnosis not present

## 2018-06-23 DIAGNOSIS — I2510 Atherosclerotic heart disease of native coronary artery without angina pectoris: Secondary | ICD-10-CM | POA: Diagnosis not present

## 2018-06-24 DIAGNOSIS — I872 Venous insufficiency (chronic) (peripheral): Secondary | ICD-10-CM | POA: Diagnosis not present

## 2018-06-24 DIAGNOSIS — J45909 Unspecified asthma, uncomplicated: Secondary | ICD-10-CM | POA: Diagnosis not present

## 2018-06-24 DIAGNOSIS — I110 Hypertensive heart disease with heart failure: Secondary | ICD-10-CM | POA: Diagnosis not present

## 2018-06-24 DIAGNOSIS — I5032 Chronic diastolic (congestive) heart failure: Secondary | ICD-10-CM | POA: Diagnosis not present

## 2018-06-24 DIAGNOSIS — I2510 Atherosclerotic heart disease of native coronary artery without angina pectoris: Secondary | ICD-10-CM | POA: Diagnosis not present

## 2018-06-24 DIAGNOSIS — E1151 Type 2 diabetes mellitus with diabetic peripheral angiopathy without gangrene: Secondary | ICD-10-CM | POA: Diagnosis not present

## 2018-06-25 DIAGNOSIS — I872 Venous insufficiency (chronic) (peripheral): Secondary | ICD-10-CM | POA: Diagnosis not present

## 2018-06-25 DIAGNOSIS — J45909 Unspecified asthma, uncomplicated: Secondary | ICD-10-CM | POA: Diagnosis not present

## 2018-06-25 DIAGNOSIS — I110 Hypertensive heart disease with heart failure: Secondary | ICD-10-CM | POA: Diagnosis not present

## 2018-06-25 DIAGNOSIS — I5032 Chronic diastolic (congestive) heart failure: Secondary | ICD-10-CM | POA: Diagnosis not present

## 2018-06-25 DIAGNOSIS — E1151 Type 2 diabetes mellitus with diabetic peripheral angiopathy without gangrene: Secondary | ICD-10-CM | POA: Diagnosis not present

## 2018-06-25 DIAGNOSIS — I2510 Atherosclerotic heart disease of native coronary artery without angina pectoris: Secondary | ICD-10-CM | POA: Diagnosis not present

## 2018-06-26 DIAGNOSIS — I872 Venous insufficiency (chronic) (peripheral): Secondary | ICD-10-CM | POA: Diagnosis not present

## 2018-06-26 DIAGNOSIS — I110 Hypertensive heart disease with heart failure: Secondary | ICD-10-CM | POA: Diagnosis not present

## 2018-06-26 DIAGNOSIS — I2510 Atherosclerotic heart disease of native coronary artery without angina pectoris: Secondary | ICD-10-CM | POA: Diagnosis not present

## 2018-06-26 DIAGNOSIS — E1151 Type 2 diabetes mellitus with diabetic peripheral angiopathy without gangrene: Secondary | ICD-10-CM | POA: Diagnosis not present

## 2018-06-26 DIAGNOSIS — I5032 Chronic diastolic (congestive) heart failure: Secondary | ICD-10-CM | POA: Diagnosis not present

## 2018-06-26 DIAGNOSIS — J45909 Unspecified asthma, uncomplicated: Secondary | ICD-10-CM | POA: Diagnosis not present

## 2018-06-29 ENCOUNTER — Ambulatory Visit (INDEPENDENT_AMBULATORY_CARE_PROVIDER_SITE_OTHER): Payer: Medicare Other | Admitting: Osteopathic Medicine

## 2018-06-29 ENCOUNTER — Telehealth: Payer: Self-pay | Admitting: Osteopathic Medicine

## 2018-06-29 ENCOUNTER — Telehealth: Payer: Self-pay | Admitting: Cardiovascular Disease

## 2018-06-29 DIAGNOSIS — I2583 Coronary atherosclerosis due to lipid rich plaque: Secondary | ICD-10-CM

## 2018-06-29 DIAGNOSIS — I110 Hypertensive heart disease with heart failure: Secondary | ICD-10-CM | POA: Diagnosis not present

## 2018-06-29 DIAGNOSIS — I5032 Chronic diastolic (congestive) heart failure: Secondary | ICD-10-CM | POA: Diagnosis not present

## 2018-06-29 DIAGNOSIS — I509 Heart failure, unspecified: Secondary | ICD-10-CM

## 2018-06-29 DIAGNOSIS — E1151 Type 2 diabetes mellitus with diabetic peripheral angiopathy without gangrene: Secondary | ICD-10-CM | POA: Diagnosis not present

## 2018-06-29 DIAGNOSIS — I872 Venous insufficiency (chronic) (peripheral): Secondary | ICD-10-CM | POA: Diagnosis not present

## 2018-06-29 DIAGNOSIS — J45909 Unspecified asthma, uncomplicated: Secondary | ICD-10-CM | POA: Diagnosis not present

## 2018-06-29 DIAGNOSIS — I10 Essential (primary) hypertension: Secondary | ICD-10-CM | POA: Diagnosis not present

## 2018-06-29 DIAGNOSIS — I2510 Atherosclerotic heart disease of native coronary artery without angina pectoris: Secondary | ICD-10-CM | POA: Diagnosis not present

## 2018-06-29 NOTE — Telephone Encounter (Signed)
  Pt c/o swelling: STAT is pt has developed SOB within 24 hours  1) How much weight have you gained and in what time span? 4/30-5/18 went from 212 to 221  2) If swelling, where is the swelling located? Legs, ankles, feet  3) Are you currently taking a fluid pill? yes  4) Are you currently SOB? no  5) Do you have a log of your daily weights (if so, list)? yes  6) Have you gained 3 pounds in a day or 5 pounds in a week? yes  7) Have you traveled recently? No  Daughter is calling because when mom got out of rehab on April 30th and weighed 212 and today she is 221. She would like to know if they need to up her dosage of her furosemide.

## 2018-06-29 NOTE — Telephone Encounter (Signed)
Received a fax from [facility] that patients resting heart rate was 57. PCP was notified. I have scheduled a follow up on 06/29/2018.

## 2018-06-29 NOTE — Telephone Encounter (Signed)
Not too worried about 74 but she does need follow-up appt anyway, pt is on schedule for this afternoon

## 2018-06-29 NOTE — Progress Notes (Signed)
Virtual Visit via Phone Note  I connected with      Katherine Cervantes on 06/30/18 at 4:00 by a telemedicine application and verified that I am speaking with the correct person using two identifiers.  Patient is at home I am working from home   I discussed the limitations of evaluation and management by telemedicine and the availability of in person appointments. The patient expressed understanding and agreed to proceed.  History of Present Illness: Katherine GumsMary Judith Misiaszek is a 85 y.o. female who would like to discuss  Chief Complaint  Patient presents with  . Bradycardia - concern from home health Hospital follow-up      Patient is having home health, discharged from rehab/SNF, was hospitalized   Has some concerns about weight gain. Was told by home health possibly having too much sodium, but her daughter has been preparing all the meals. Phone note form cardiologist today noted that Lasix dose was too low, should be on 80 mg. Pt reports diligently keeping to diet w/ low salt and veggies, working with rehab to help strength/endurance.   Patient has no other concerns at this time, reports she is doing well and receiving help from family.   Wt Readings from Last 3 Encounters:  06/19/18 213 lb (96.6 kg)  06/03/18 213 lb (96.6 kg)  05/22/18 232 lb 12.9 oz (105.6 kg)       Observations/Objective: There were no vitals taken for this visit. BP Readings from Last 3 Encounters:  06/03/18 120/80  05/22/18 135/61  02/23/18 124/80   Exam: Normal Speech.  NAD  Lab and Radiology Results No results found for this or any previous visit (from the past 72 hour(s)). No results found.     Assessment and Plan: 85 y.o. female with The primary encounter diagnosis was Coronary artery disease due to lipid rich plaque. Diagnoses of Congestive heart failure, unspecified HF chronicity, unspecified heart failure type (HCC) and Essential hypertension were also pertinent to this  visit.  Conditions are stable, sounds like cardiology sorted out the issue as far as weight gain and patient was advised of their recommendations, reiterated increase Lasix to recommended 80 mg dosage.  Follow-up as directed with cardiology.  Patient is to follow-up with me in the fall for annual physical.  PDMP not reviewed this encounter. No orders of the defined types were placed in this encounter.  No orders of the defined types were placed in this encounter.  There are no Patient Instructions on file for this visit.  Instructions sent via MyChart. If MyChart not available, pt was given option for info via personal e-mail w/ no guarantee of protected health info over unsecured e-mail communication, and MyChart sign-up instructions were included.   Follow Up Instructions: Return in about 6 months (around 12/30/2018) for annual physical, sooner if needed .    I discussed the assessment and treatment plan with the patient. The patient was provided an opportunity to ask questions and all were answered. The patient agreed with the plan and demonstrated an understanding of the instructions.   The patient was advised to call back or seek an in-person evaluation if any new concerns, if symptoms worsen or if the condition fails to improve as anticipated.  21 minutes of non-face-to-face time was provided during this encounter.                      Historical information moved to improve visibility of documentation.  Past Medical History:  Diagnosis Date  . Anticoagulation adequate 05/22/2018  . Asthma   . Atrial fibrillation (HCC) 09/27/2011  . CAD (coronary artery disease) 09/27/2011  . Diabetes mellitus without complication (HCC)   . Dyslipidemia 09/27/2011  . History of vasculitis 09/27/2011  . Hypertension 09/27/2011  . Osteoporosis 09/27/2011  . Pacemaker   . PUD (peptic ulcer disease)   . Rheumatoid arthritis(714.0) 09/27/2011   Past Surgical History:  Procedure  Laterality Date  . ABDOMINAL HYSTERECTOMY    . ANKLE SURGERY    . APPENDECTOMY    . Carpal tunnel    . CHOLECYSTECTOMY    . PARTIAL GASTRECTOMY     Social History   Tobacco Use  . Smoking status: Never Smoker  . Smokeless tobacco: Never Used  Substance Use Topics  . Alcohol use: Yes    Alcohol/week: 0.0 standard drinks    Comment: Occasional   family history includes Cancer in her brother and brother; Hypertension in her mother.  Medications: Current Outpatient Medications  Medication Sig Dispense Refill  . acetaminophen (TYLENOL) 325 MG tablet Take 2 tablets (650 mg total) by mouth every 4 (four) hours as needed for headache or mild pain.    Marland Kitchen amLODipine (NORVASC) 5 MG tablet Take 1 tablet (5 mg total) by mouth daily. 30 tablet 3  . apixaban (ELIQUIS) 5 MG TABS tablet Take 1 tablet (5 mg total) by mouth 2 (two) times daily. 60 tablet 6  . furosemide (LASIX) 80 MG tablet Take 1 tablet (80 mg total) by mouth daily. 30 tablet 6  . neomycin-bacitracin-polymyxin (NEOSPORIN) ointment Apply 1 application topically as needed for wound care.    . nitroGLYCERIN (NITROSTAT) 0.4 MG SL tablet Place 1 tablet (0.4 mg total) under the tongue every 5 (five) minutes as needed for chest pain (MAX 3 TABLETS). 25 tablet 3  . potassium chloride SA (K-DUR,KLOR-CON) 20 MEQ tablet Take 1 tablet (20 mEq total) by mouth daily. 30 tablet 6  . psyllium (HYDROCIL/METAMUCIL) 95 % PACK Take 1 packet by mouth as needed for mild constipation. 240 each   . spironolactone (ALDACTONE) 25 MG tablet Take 1 tablet (25 mg total) by mouth daily. 30 tablet 6   No current facility-administered medications for this visit.    Allergies  Allergen Reactions  . Aspirin Anaphylaxis  . Other Anaphylaxis  . Penicillins Anaphylaxis    Anaphylaxis  . Codeine Other (See Comments)    SOB Asthmatic issues  . Morphine And Related Other (See Comments)    Anaphylaxis  . Sulfa Antibiotics Rash  . Ether   . Levaquin [Levofloxacin  In D5w] Other (See Comments)    Disoriented, Hallucinations  . Metoprolol     Possible  . Prednisone Other (See Comments)    Lethargic, rash  . Procaine     PDMP not reviewed this encounter. No orders of the defined types were placed in this encounter.  No orders of the defined types were placed in this encounter.

## 2018-06-29 NOTE — Telephone Encounter (Signed)
Spoke with pt's daughter who report pt was discharged from rehab facility on 4/30 and weighed 212 lbs at that time. She report today, pt's current weight is 221 and has some swelling in legs, ankle, and feet. She denies any other symptoms or SOB. Nurse verified whether pt is taking prescribed Lasix dose of 80 mg. Daughter report she thought pt was only taking 20 mg. Informed daughter per discharge instruction on 4/11, pt is to take 80 mg daily. Daughter advised to increase dose to the prescribed amount, monitor weight/swelling, and contact office if symptoms worsen or unchanged. Daughter voiced understanding.

## 2018-06-30 ENCOUNTER — Encounter: Payer: Self-pay | Admitting: Osteopathic Medicine

## 2018-06-30 DIAGNOSIS — E1151 Type 2 diabetes mellitus with diabetic peripheral angiopathy without gangrene: Secondary | ICD-10-CM | POA: Diagnosis not present

## 2018-06-30 DIAGNOSIS — I110 Hypertensive heart disease with heart failure: Secondary | ICD-10-CM | POA: Diagnosis not present

## 2018-06-30 DIAGNOSIS — I2510 Atherosclerotic heart disease of native coronary artery without angina pectoris: Secondary | ICD-10-CM | POA: Diagnosis not present

## 2018-06-30 DIAGNOSIS — J45909 Unspecified asthma, uncomplicated: Secondary | ICD-10-CM | POA: Diagnosis not present

## 2018-06-30 DIAGNOSIS — I5032 Chronic diastolic (congestive) heart failure: Secondary | ICD-10-CM | POA: Diagnosis not present

## 2018-06-30 DIAGNOSIS — I872 Venous insufficiency (chronic) (peripheral): Secondary | ICD-10-CM | POA: Diagnosis not present

## 2018-07-01 DIAGNOSIS — I5032 Chronic diastolic (congestive) heart failure: Secondary | ICD-10-CM | POA: Diagnosis not present

## 2018-07-01 DIAGNOSIS — I2510 Atherosclerotic heart disease of native coronary artery without angina pectoris: Secondary | ICD-10-CM | POA: Diagnosis not present

## 2018-07-01 DIAGNOSIS — E1151 Type 2 diabetes mellitus with diabetic peripheral angiopathy without gangrene: Secondary | ICD-10-CM | POA: Diagnosis not present

## 2018-07-01 DIAGNOSIS — J45909 Unspecified asthma, uncomplicated: Secondary | ICD-10-CM | POA: Diagnosis not present

## 2018-07-01 DIAGNOSIS — I110 Hypertensive heart disease with heart failure: Secondary | ICD-10-CM | POA: Diagnosis not present

## 2018-07-01 DIAGNOSIS — I872 Venous insufficiency (chronic) (peripheral): Secondary | ICD-10-CM | POA: Diagnosis not present

## 2018-07-02 DIAGNOSIS — J45909 Unspecified asthma, uncomplicated: Secondary | ICD-10-CM | POA: Diagnosis not present

## 2018-07-02 DIAGNOSIS — I872 Venous insufficiency (chronic) (peripheral): Secondary | ICD-10-CM | POA: Diagnosis not present

## 2018-07-02 DIAGNOSIS — I2510 Atherosclerotic heart disease of native coronary artery without angina pectoris: Secondary | ICD-10-CM | POA: Diagnosis not present

## 2018-07-02 DIAGNOSIS — E1151 Type 2 diabetes mellitus with diabetic peripheral angiopathy without gangrene: Secondary | ICD-10-CM | POA: Diagnosis not present

## 2018-07-02 DIAGNOSIS — I5032 Chronic diastolic (congestive) heart failure: Secondary | ICD-10-CM | POA: Diagnosis not present

## 2018-07-02 DIAGNOSIS — I110 Hypertensive heart disease with heart failure: Secondary | ICD-10-CM | POA: Diagnosis not present

## 2018-07-03 DIAGNOSIS — J45909 Unspecified asthma, uncomplicated: Secondary | ICD-10-CM | POA: Diagnosis not present

## 2018-07-03 DIAGNOSIS — I110 Hypertensive heart disease with heart failure: Secondary | ICD-10-CM | POA: Diagnosis not present

## 2018-07-03 DIAGNOSIS — I872 Venous insufficiency (chronic) (peripheral): Secondary | ICD-10-CM | POA: Diagnosis not present

## 2018-07-03 DIAGNOSIS — E1151 Type 2 diabetes mellitus with diabetic peripheral angiopathy without gangrene: Secondary | ICD-10-CM | POA: Diagnosis not present

## 2018-07-03 DIAGNOSIS — I2510 Atherosclerotic heart disease of native coronary artery without angina pectoris: Secondary | ICD-10-CM | POA: Diagnosis not present

## 2018-07-03 DIAGNOSIS — I5032 Chronic diastolic (congestive) heart failure: Secondary | ICD-10-CM | POA: Diagnosis not present

## 2018-07-06 DIAGNOSIS — J45909 Unspecified asthma, uncomplicated: Secondary | ICD-10-CM | POA: Diagnosis not present

## 2018-07-06 DIAGNOSIS — I2510 Atherosclerotic heart disease of native coronary artery without angina pectoris: Secondary | ICD-10-CM | POA: Diagnosis not present

## 2018-07-06 DIAGNOSIS — I110 Hypertensive heart disease with heart failure: Secondary | ICD-10-CM | POA: Diagnosis not present

## 2018-07-06 DIAGNOSIS — I5032 Chronic diastolic (congestive) heart failure: Secondary | ICD-10-CM | POA: Diagnosis not present

## 2018-07-06 DIAGNOSIS — E1151 Type 2 diabetes mellitus with diabetic peripheral angiopathy without gangrene: Secondary | ICD-10-CM | POA: Diagnosis not present

## 2018-07-06 DIAGNOSIS — I872 Venous insufficiency (chronic) (peripheral): Secondary | ICD-10-CM | POA: Diagnosis not present

## 2018-07-07 DIAGNOSIS — I110 Hypertensive heart disease with heart failure: Secondary | ICD-10-CM | POA: Diagnosis not present

## 2018-07-07 DIAGNOSIS — I5032 Chronic diastolic (congestive) heart failure: Secondary | ICD-10-CM | POA: Diagnosis not present

## 2018-07-07 DIAGNOSIS — E1151 Type 2 diabetes mellitus with diabetic peripheral angiopathy without gangrene: Secondary | ICD-10-CM | POA: Diagnosis not present

## 2018-07-07 DIAGNOSIS — J45909 Unspecified asthma, uncomplicated: Secondary | ICD-10-CM | POA: Diagnosis not present

## 2018-07-07 DIAGNOSIS — I872 Venous insufficiency (chronic) (peripheral): Secondary | ICD-10-CM | POA: Diagnosis not present

## 2018-07-07 DIAGNOSIS — I2510 Atherosclerotic heart disease of native coronary artery without angina pectoris: Secondary | ICD-10-CM | POA: Diagnosis not present

## 2018-07-08 DIAGNOSIS — I5032 Chronic diastolic (congestive) heart failure: Secondary | ICD-10-CM | POA: Diagnosis not present

## 2018-07-08 DIAGNOSIS — R609 Edema, unspecified: Secondary | ICD-10-CM | POA: Diagnosis not present

## 2018-07-08 DIAGNOSIS — I110 Hypertensive heart disease with heart failure: Secondary | ICD-10-CM | POA: Diagnosis not present

## 2018-07-09 DIAGNOSIS — I2510 Atherosclerotic heart disease of native coronary artery without angina pectoris: Secondary | ICD-10-CM | POA: Diagnosis not present

## 2018-07-09 DIAGNOSIS — E1151 Type 2 diabetes mellitus with diabetic peripheral angiopathy without gangrene: Secondary | ICD-10-CM | POA: Diagnosis not present

## 2018-07-09 DIAGNOSIS — I872 Venous insufficiency (chronic) (peripheral): Secondary | ICD-10-CM | POA: Diagnosis not present

## 2018-07-09 DIAGNOSIS — I5032 Chronic diastolic (congestive) heart failure: Secondary | ICD-10-CM | POA: Diagnosis not present

## 2018-07-09 DIAGNOSIS — I110 Hypertensive heart disease with heart failure: Secondary | ICD-10-CM | POA: Diagnosis not present

## 2018-07-09 DIAGNOSIS — J45909 Unspecified asthma, uncomplicated: Secondary | ICD-10-CM | POA: Diagnosis not present

## 2018-07-13 DIAGNOSIS — J45909 Unspecified asthma, uncomplicated: Secondary | ICD-10-CM | POA: Diagnosis not present

## 2018-07-13 DIAGNOSIS — I872 Venous insufficiency (chronic) (peripheral): Secondary | ICD-10-CM | POA: Diagnosis not present

## 2018-07-13 DIAGNOSIS — I2510 Atherosclerotic heart disease of native coronary artery without angina pectoris: Secondary | ICD-10-CM | POA: Diagnosis not present

## 2018-07-13 DIAGNOSIS — E1151 Type 2 diabetes mellitus with diabetic peripheral angiopathy without gangrene: Secondary | ICD-10-CM | POA: Diagnosis not present

## 2018-07-13 DIAGNOSIS — I110 Hypertensive heart disease with heart failure: Secondary | ICD-10-CM | POA: Diagnosis not present

## 2018-07-13 DIAGNOSIS — I5032 Chronic diastolic (congestive) heart failure: Secondary | ICD-10-CM | POA: Diagnosis not present

## 2018-07-14 DIAGNOSIS — I2510 Atherosclerotic heart disease of native coronary artery without angina pectoris: Secondary | ICD-10-CM | POA: Diagnosis not present

## 2018-07-14 DIAGNOSIS — E1151 Type 2 diabetes mellitus with diabetic peripheral angiopathy without gangrene: Secondary | ICD-10-CM | POA: Diagnosis not present

## 2018-07-14 DIAGNOSIS — I872 Venous insufficiency (chronic) (peripheral): Secondary | ICD-10-CM | POA: Diagnosis not present

## 2018-07-14 DIAGNOSIS — I110 Hypertensive heart disease with heart failure: Secondary | ICD-10-CM | POA: Diagnosis not present

## 2018-07-14 DIAGNOSIS — J45909 Unspecified asthma, uncomplicated: Secondary | ICD-10-CM | POA: Diagnosis not present

## 2018-07-14 DIAGNOSIS — I5032 Chronic diastolic (congestive) heart failure: Secondary | ICD-10-CM | POA: Diagnosis not present

## 2018-07-15 DIAGNOSIS — E1151 Type 2 diabetes mellitus with diabetic peripheral angiopathy without gangrene: Secondary | ICD-10-CM | POA: Diagnosis not present

## 2018-07-15 DIAGNOSIS — I110 Hypertensive heart disease with heart failure: Secondary | ICD-10-CM | POA: Diagnosis not present

## 2018-07-15 DIAGNOSIS — I2510 Atherosclerotic heart disease of native coronary artery without angina pectoris: Secondary | ICD-10-CM | POA: Diagnosis not present

## 2018-07-15 DIAGNOSIS — I872 Venous insufficiency (chronic) (peripheral): Secondary | ICD-10-CM | POA: Diagnosis not present

## 2018-07-15 DIAGNOSIS — J45909 Unspecified asthma, uncomplicated: Secondary | ICD-10-CM | POA: Diagnosis not present

## 2018-07-15 DIAGNOSIS — I5032 Chronic diastolic (congestive) heart failure: Secondary | ICD-10-CM | POA: Diagnosis not present

## 2018-07-17 DIAGNOSIS — J45909 Unspecified asthma, uncomplicated: Secondary | ICD-10-CM | POA: Diagnosis not present

## 2018-07-17 DIAGNOSIS — I2510 Atherosclerotic heart disease of native coronary artery without angina pectoris: Secondary | ICD-10-CM | POA: Diagnosis not present

## 2018-07-17 DIAGNOSIS — I872 Venous insufficiency (chronic) (peripheral): Secondary | ICD-10-CM | POA: Diagnosis not present

## 2018-07-17 DIAGNOSIS — I110 Hypertensive heart disease with heart failure: Secondary | ICD-10-CM | POA: Diagnosis not present

## 2018-07-17 DIAGNOSIS — E1151 Type 2 diabetes mellitus with diabetic peripheral angiopathy without gangrene: Secondary | ICD-10-CM | POA: Diagnosis not present

## 2018-07-17 DIAGNOSIS — I5032 Chronic diastolic (congestive) heart failure: Secondary | ICD-10-CM | POA: Diagnosis not present

## 2018-07-20 DIAGNOSIS — I2510 Atherosclerotic heart disease of native coronary artery without angina pectoris: Secondary | ICD-10-CM | POA: Diagnosis not present

## 2018-07-20 DIAGNOSIS — E1151 Type 2 diabetes mellitus with diabetic peripheral angiopathy without gangrene: Secondary | ICD-10-CM | POA: Diagnosis not present

## 2018-07-20 DIAGNOSIS — I110 Hypertensive heart disease with heart failure: Secondary | ICD-10-CM | POA: Diagnosis not present

## 2018-07-20 DIAGNOSIS — J45909 Unspecified asthma, uncomplicated: Secondary | ICD-10-CM | POA: Diagnosis not present

## 2018-07-20 DIAGNOSIS — I872 Venous insufficiency (chronic) (peripheral): Secondary | ICD-10-CM | POA: Diagnosis not present

## 2018-07-20 DIAGNOSIS — I5032 Chronic diastolic (congestive) heart failure: Secondary | ICD-10-CM | POA: Diagnosis not present

## 2018-07-21 DIAGNOSIS — I110 Hypertensive heart disease with heart failure: Secondary | ICD-10-CM | POA: Diagnosis not present

## 2018-07-21 DIAGNOSIS — E1151 Type 2 diabetes mellitus with diabetic peripheral angiopathy without gangrene: Secondary | ICD-10-CM | POA: Diagnosis not present

## 2018-07-21 DIAGNOSIS — I5032 Chronic diastolic (congestive) heart failure: Secondary | ICD-10-CM | POA: Diagnosis not present

## 2018-07-21 DIAGNOSIS — J45909 Unspecified asthma, uncomplicated: Secondary | ICD-10-CM | POA: Diagnosis not present

## 2018-07-21 DIAGNOSIS — I2510 Atherosclerotic heart disease of native coronary artery without angina pectoris: Secondary | ICD-10-CM | POA: Diagnosis not present

## 2018-07-21 DIAGNOSIS — I872 Venous insufficiency (chronic) (peripheral): Secondary | ICD-10-CM | POA: Diagnosis not present

## 2018-07-23 DIAGNOSIS — I872 Venous insufficiency (chronic) (peripheral): Secondary | ICD-10-CM | POA: Diagnosis not present

## 2018-07-23 DIAGNOSIS — J45909 Unspecified asthma, uncomplicated: Secondary | ICD-10-CM | POA: Diagnosis not present

## 2018-07-23 DIAGNOSIS — I110 Hypertensive heart disease with heart failure: Secondary | ICD-10-CM | POA: Diagnosis not present

## 2018-07-23 DIAGNOSIS — E1151 Type 2 diabetes mellitus with diabetic peripheral angiopathy without gangrene: Secondary | ICD-10-CM | POA: Diagnosis not present

## 2018-07-23 DIAGNOSIS — I2510 Atherosclerotic heart disease of native coronary artery without angina pectoris: Secondary | ICD-10-CM | POA: Diagnosis not present

## 2018-07-23 DIAGNOSIS — I5032 Chronic diastolic (congestive) heart failure: Secondary | ICD-10-CM | POA: Diagnosis not present

## 2018-07-24 DIAGNOSIS — I2510 Atherosclerotic heart disease of native coronary artery without angina pectoris: Secondary | ICD-10-CM | POA: Diagnosis not present

## 2018-07-24 DIAGNOSIS — I5032 Chronic diastolic (congestive) heart failure: Secondary | ICD-10-CM | POA: Diagnosis not present

## 2018-07-24 DIAGNOSIS — E1151 Type 2 diabetes mellitus with diabetic peripheral angiopathy without gangrene: Secondary | ICD-10-CM | POA: Diagnosis not present

## 2018-07-24 DIAGNOSIS — I110 Hypertensive heart disease with heart failure: Secondary | ICD-10-CM | POA: Diagnosis not present

## 2018-07-24 DIAGNOSIS — J45909 Unspecified asthma, uncomplicated: Secondary | ICD-10-CM | POA: Diagnosis not present

## 2018-07-24 DIAGNOSIS — I872 Venous insufficiency (chronic) (peripheral): Secondary | ICD-10-CM | POA: Diagnosis not present

## 2018-07-27 DIAGNOSIS — I872 Venous insufficiency (chronic) (peripheral): Secondary | ICD-10-CM | POA: Diagnosis not present

## 2018-07-27 DIAGNOSIS — I5032 Chronic diastolic (congestive) heart failure: Secondary | ICD-10-CM | POA: Diagnosis not present

## 2018-07-27 DIAGNOSIS — E1151 Type 2 diabetes mellitus with diabetic peripheral angiopathy without gangrene: Secondary | ICD-10-CM | POA: Diagnosis not present

## 2018-07-27 DIAGNOSIS — J45909 Unspecified asthma, uncomplicated: Secondary | ICD-10-CM | POA: Diagnosis not present

## 2018-07-27 DIAGNOSIS — I2510 Atherosclerotic heart disease of native coronary artery without angina pectoris: Secondary | ICD-10-CM | POA: Diagnosis not present

## 2018-07-27 DIAGNOSIS — I110 Hypertensive heart disease with heart failure: Secondary | ICD-10-CM | POA: Diagnosis not present

## 2018-07-28 DIAGNOSIS — E1151 Type 2 diabetes mellitus with diabetic peripheral angiopathy without gangrene: Secondary | ICD-10-CM | POA: Diagnosis not present

## 2018-07-28 DIAGNOSIS — I5032 Chronic diastolic (congestive) heart failure: Secondary | ICD-10-CM | POA: Diagnosis not present

## 2018-07-28 DIAGNOSIS — I872 Venous insufficiency (chronic) (peripheral): Secondary | ICD-10-CM | POA: Diagnosis not present

## 2018-07-28 DIAGNOSIS — I2510 Atherosclerotic heart disease of native coronary artery without angina pectoris: Secondary | ICD-10-CM | POA: Diagnosis not present

## 2018-07-28 DIAGNOSIS — I110 Hypertensive heart disease with heart failure: Secondary | ICD-10-CM | POA: Diagnosis not present

## 2018-07-28 DIAGNOSIS — J45909 Unspecified asthma, uncomplicated: Secondary | ICD-10-CM | POA: Diagnosis not present

## 2018-07-30 DIAGNOSIS — J45909 Unspecified asthma, uncomplicated: Secondary | ICD-10-CM | POA: Diagnosis not present

## 2018-07-30 DIAGNOSIS — I872 Venous insufficiency (chronic) (peripheral): Secondary | ICD-10-CM | POA: Diagnosis not present

## 2018-07-30 DIAGNOSIS — I2510 Atherosclerotic heart disease of native coronary artery without angina pectoris: Secondary | ICD-10-CM | POA: Diagnosis not present

## 2018-07-30 DIAGNOSIS — I5032 Chronic diastolic (congestive) heart failure: Secondary | ICD-10-CM | POA: Diagnosis not present

## 2018-07-30 DIAGNOSIS — E1151 Type 2 diabetes mellitus with diabetic peripheral angiopathy without gangrene: Secondary | ICD-10-CM | POA: Diagnosis not present

## 2018-07-30 DIAGNOSIS — I110 Hypertensive heart disease with heart failure: Secondary | ICD-10-CM | POA: Diagnosis not present

## 2018-07-31 DIAGNOSIS — E1151 Type 2 diabetes mellitus with diabetic peripheral angiopathy without gangrene: Secondary | ICD-10-CM | POA: Diagnosis not present

## 2018-07-31 DIAGNOSIS — J45909 Unspecified asthma, uncomplicated: Secondary | ICD-10-CM | POA: Diagnosis not present

## 2018-07-31 DIAGNOSIS — I2510 Atherosclerotic heart disease of native coronary artery without angina pectoris: Secondary | ICD-10-CM | POA: Diagnosis not present

## 2018-07-31 DIAGNOSIS — I5032 Chronic diastolic (congestive) heart failure: Secondary | ICD-10-CM | POA: Diagnosis not present

## 2018-07-31 DIAGNOSIS — I872 Venous insufficiency (chronic) (peripheral): Secondary | ICD-10-CM | POA: Diagnosis not present

## 2018-07-31 DIAGNOSIS — I110 Hypertensive heart disease with heart failure: Secondary | ICD-10-CM | POA: Diagnosis not present

## 2018-08-03 DIAGNOSIS — E1151 Type 2 diabetes mellitus with diabetic peripheral angiopathy without gangrene: Secondary | ICD-10-CM | POA: Diagnosis not present

## 2018-08-03 DIAGNOSIS — I872 Venous insufficiency (chronic) (peripheral): Secondary | ICD-10-CM | POA: Diagnosis not present

## 2018-08-03 DIAGNOSIS — I5032 Chronic diastolic (congestive) heart failure: Secondary | ICD-10-CM | POA: Diagnosis not present

## 2018-08-03 DIAGNOSIS — I110 Hypertensive heart disease with heart failure: Secondary | ICD-10-CM | POA: Diagnosis not present

## 2018-08-03 DIAGNOSIS — I2510 Atherosclerotic heart disease of native coronary artery without angina pectoris: Secondary | ICD-10-CM | POA: Diagnosis not present

## 2018-08-03 DIAGNOSIS — J45909 Unspecified asthma, uncomplicated: Secondary | ICD-10-CM | POA: Diagnosis not present

## 2018-08-05 ENCOUNTER — Telehealth: Payer: Self-pay

## 2018-08-05 DIAGNOSIS — I10 Essential (primary) hypertension: Secondary | ICD-10-CM | POA: Diagnosis not present

## 2018-08-05 DIAGNOSIS — I5032 Chronic diastolic (congestive) heart failure: Secondary | ICD-10-CM | POA: Diagnosis not present

## 2018-08-05 DIAGNOSIS — E1151 Type 2 diabetes mellitus with diabetic peripheral angiopathy without gangrene: Secondary | ICD-10-CM | POA: Diagnosis not present

## 2018-08-05 DIAGNOSIS — I872 Venous insufficiency (chronic) (peripheral): Secondary | ICD-10-CM | POA: Diagnosis not present

## 2018-08-05 DIAGNOSIS — I110 Hypertensive heart disease with heart failure: Secondary | ICD-10-CM | POA: Diagnosis not present

## 2018-08-05 DIAGNOSIS — J45909 Unspecified asthma, uncomplicated: Secondary | ICD-10-CM | POA: Diagnosis not present

## 2018-08-05 DIAGNOSIS — I2510 Atherosclerotic heart disease of native coronary artery without angina pectoris: Secondary | ICD-10-CM | POA: Diagnosis not present

## 2018-08-05 LAB — BASIC METABOLIC PANEL  EGFR
BUN/Creatinine Ratio: 30 (calc) — ABNORMAL HIGH (ref 6–22)
BUN: 32 mg/dL — ABNORMAL HIGH (ref 7–25)
CO2: 22 mmol/L (ref 20–32)
Calcium: 9.7 mg/dL (ref 8.6–10.4)
Chloride: 104 mmol/L (ref 98–110)
Creat: 1.06 mg/dL — ABNORMAL HIGH (ref 0.60–0.88)
GFR, Est African American: 55 mL/min/{1.73_m2} — ABNORMAL LOW (ref >=60)
GFR, Est Non African American: 47 mL/min/{1.73_m2} — ABNORMAL LOW (ref >=60)
Glucose, Bld: 131 mg/dL — ABNORMAL HIGH (ref 65–99)
POTASSIUM: 3.8 mmol/L (ref 3.4–4.8)
Sodium: 140 mmol/L (ref 135–146)

## 2018-08-05 NOTE — Telephone Encounter (Signed)
[place], [place] dropped by with a blood sample for a BMP. Per Dr Sheppard Coil, ordered labs.

## 2018-08-06 DIAGNOSIS — I110 Hypertensive heart disease with heart failure: Secondary | ICD-10-CM | POA: Diagnosis not present

## 2018-08-06 DIAGNOSIS — I872 Venous insufficiency (chronic) (peripheral): Secondary | ICD-10-CM | POA: Diagnosis not present

## 2018-08-06 DIAGNOSIS — I2510 Atherosclerotic heart disease of native coronary artery without angina pectoris: Secondary | ICD-10-CM | POA: Diagnosis not present

## 2018-08-06 DIAGNOSIS — J45909 Unspecified asthma, uncomplicated: Secondary | ICD-10-CM | POA: Diagnosis not present

## 2018-08-06 DIAGNOSIS — E1151 Type 2 diabetes mellitus with diabetic peripheral angiopathy without gangrene: Secondary | ICD-10-CM | POA: Diagnosis not present

## 2018-08-06 DIAGNOSIS — I5032 Chronic diastolic (congestive) heart failure: Secondary | ICD-10-CM | POA: Diagnosis not present

## 2018-08-07 ENCOUNTER — Other Ambulatory Visit: Payer: Self-pay

## 2018-08-07 NOTE — Patient Outreach (Signed)
[facility]) Care Management  08/07/2018  Coda Filler 1929-08-06 [id number]    Telephone Screen Referral Date :08/06/2018 Referral Source:[employer] High Risk  Referral Reason:screening Insurance:[employer]    Outreach attempt # 1 to patient. Patient answered the phone but was very wary of speaking with me.  She did verify name and address.  The patient was very reluctant to give answers for screening. She declined to continue. RN Health Coach left contact information with the patient to verify. She stated that she would prefer to call me back.    Plan: RN Health Coach will send the patient a successful letter, pamphlet and 24 hour nurse line magnet. If no response to call within [place] will proceed with case closure.   Lazaro Arms RN, BSN, [place] Direct Dial:  [phone] Fax: [phone]

## 2018-08-08 DIAGNOSIS — R609 Edema, unspecified: Secondary | ICD-10-CM | POA: Diagnosis not present

## 2018-08-08 DIAGNOSIS — I5032 Chronic diastolic (congestive) heart failure: Secondary | ICD-10-CM | POA: Diagnosis not present

## 2018-08-08 DIAGNOSIS — I110 Hypertensive heart disease with heart failure: Secondary | ICD-10-CM | POA: Diagnosis not present

## 2018-08-09 DIAGNOSIS — I872 Venous insufficiency (chronic) (peripheral): Secondary | ICD-10-CM | POA: Diagnosis not present

## 2018-08-09 DIAGNOSIS — I5032 Chronic diastolic (congestive) heart failure: Secondary | ICD-10-CM | POA: Diagnosis not present

## 2018-08-09 DIAGNOSIS — I110 Hypertensive heart disease with heart failure: Secondary | ICD-10-CM | POA: Diagnosis not present

## 2018-08-09 DIAGNOSIS — J45909 Unspecified asthma, uncomplicated: Secondary | ICD-10-CM | POA: Diagnosis not present

## 2018-08-09 DIAGNOSIS — E1151 Type 2 diabetes mellitus with diabetic peripheral angiopathy without gangrene: Secondary | ICD-10-CM | POA: Diagnosis not present

## 2018-08-09 DIAGNOSIS — I2510 Atherosclerotic heart disease of native coronary artery without angina pectoris: Secondary | ICD-10-CM | POA: Diagnosis not present

## 2018-08-11 ENCOUNTER — Telehealth: Payer: Self-pay | Admitting: Cardiovascular Disease

## 2018-08-11 ENCOUNTER — Telehealth: Payer: Self-pay

## 2018-08-11 DIAGNOSIS — I5033 Acute on chronic diastolic (congestive) heart failure: Secondary | ICD-10-CM

## 2018-08-11 MED ORDER — FUROSEMIDE 80 MG PO TABS: 80.0000 mg | ORAL_TABLET | Freq: Two times a day (BID) | ORAL | 3 refills | Status: DC

## 2018-08-11 NOTE — Telephone Encounter (Signed)
Follow up: ° ° °Patient returning call  ° ° ° °

## 2018-08-11 NOTE — Telephone Encounter (Signed)
Looks like this has been handled by Dr Jacalyn Lefevre office. If patient calls back, let me know.

## 2018-08-11 NOTE — Telephone Encounter (Signed)
Returned call to pt she states that she has gained about 20# since d/c in April from hospital she is currently taking lasix 80mg  w/ K+, spironolactone 25mg  and all other medications as directed. She states that she limited her fluids and salt intake and low fat diet started waling a little but w/walker and healthcare nurse. She does complain of SOB upon exertion and a little bit  While sitting intermittently. Increase lasix? Please advise

## 2018-08-11 NOTE — Telephone Encounter (Signed)
New Message            Pt c/o swelling: STAT is pt has developed SOB within 24 hours  1) How much weight have you gained and in what time span? 211 to 228.9  2) If swelling, where is the swelling located? All over  3) Are you currently taking a fluid pill? Furosemide  4) Are you currently SOB? Yes  5) Do you have a log of your daily weights (if so, list)? Yes  6) Have you gained 3 pounds in a day or 5 pounds in a week?Yes    7) Have you traveled recently? No

## 2018-08-11 NOTE — Telephone Encounter (Signed)
Pt called - still having serious issues with water retention. As per pt, she has restricted her fluid intake and diet, but is still gaining weight. Pt is requesting feedback from provider - can she increase her daily intake of furosemide or is there another alternative? FYI - Pt was requesting to speak with provider directly and was upset that I called her instead. Pls advise, thanks.

## 2018-08-11 NOTE — Telephone Encounter (Signed)
Noted  

## 2018-08-11 NOTE — Telephone Encounter (Signed)
Spoke with pt, Aware of dr crenshaw's recommendations. New script sent to the pharmacy and Lab orders mailed to the pt  

## 2018-08-11 NOTE — Telephone Encounter (Signed)
Left message for pt to call.

## 2018-08-11 NOTE — Telephone Encounter (Signed)
Change lasix to 80 mg BID; bmet one week Kirk Ruths

## 2018-08-17 ENCOUNTER — Encounter: Payer: Self-pay | Admitting: Cardiology

## 2018-08-17 NOTE — Telephone Encounter (Signed)
New Message    Pt is retutrning [place] Call

## 2018-09-01 NOTE — Telephone Encounter (Signed)
This encounter was created in error - please disregard.

## 2018-09-07 DIAGNOSIS — R609 Edema, unspecified: Secondary | ICD-10-CM | POA: Diagnosis not present

## 2018-09-07 DIAGNOSIS — I110 Hypertensive heart disease with heart failure: Secondary | ICD-10-CM | POA: Diagnosis not present

## 2018-09-07 DIAGNOSIS — I5032 Chronic diastolic (congestive) heart failure: Secondary | ICD-10-CM | POA: Diagnosis not present

## 2018-09-28 ENCOUNTER — Telehealth: Payer: Self-pay | Admitting: Medical

## 2018-09-28 DIAGNOSIS — I110 Hypertensive heart disease with heart failure: Secondary | ICD-10-CM | POA: Diagnosis not present

## 2018-09-28 DIAGNOSIS — Z886 Allergy status to analgesic agent status: Secondary | ICD-10-CM | POA: Diagnosis not present

## 2018-09-28 DIAGNOSIS — G8911 Acute pain due to trauma: Secondary | ICD-10-CM | POA: Diagnosis not present

## 2018-09-28 DIAGNOSIS — M069 Rheumatoid arthritis, unspecified: Secondary | ICD-10-CM | POA: Diagnosis not present

## 2018-09-28 DIAGNOSIS — I6782 Cerebral ischemia: Secondary | ICD-10-CM | POA: Diagnosis not present

## 2018-09-28 DIAGNOSIS — W19XXXA Unspecified fall, initial encounter: Secondary | ICD-10-CM | POA: Diagnosis not present

## 2018-09-28 DIAGNOSIS — Z884 Allergy status to anesthetic agent status: Secondary | ICD-10-CM | POA: Diagnosis not present

## 2018-09-28 DIAGNOSIS — Z950 Presence of cardiac pacemaker: Secondary | ICD-10-CM | POA: Diagnosis not present

## 2018-09-28 DIAGNOSIS — Z883 Allergy status to other anti-infective agents status: Secondary | ICD-10-CM | POA: Diagnosis not present

## 2018-09-28 DIAGNOSIS — E119 Type 2 diabetes mellitus without complications: Secondary | ICD-10-CM | POA: Diagnosis not present

## 2018-09-28 DIAGNOSIS — Z885 Allergy status to narcotic agent status: Secondary | ICD-10-CM | POA: Diagnosis not present

## 2018-09-28 DIAGNOSIS — Z043 Encounter for examination and observation following other accident: Secondary | ICD-10-CM | POA: Diagnosis not present

## 2018-09-28 DIAGNOSIS — G4730 Sleep apnea, unspecified: Secondary | ICD-10-CM | POA: Diagnosis not present

## 2018-09-28 DIAGNOSIS — R0781 Pleurodynia: Secondary | ICD-10-CM | POA: Diagnosis not present

## 2018-09-28 DIAGNOSIS — I4891 Unspecified atrial fibrillation: Secondary | ICD-10-CM | POA: Diagnosis not present

## 2018-09-28 DIAGNOSIS — J9383 Other pneumothorax: Secondary | ICD-10-CM | POA: Diagnosis not present

## 2018-09-28 DIAGNOSIS — I444 Left anterior fascicular block: Secondary | ICD-10-CM | POA: Diagnosis not present

## 2018-09-28 DIAGNOSIS — T1490XA Injury, unspecified, initial encounter: Secondary | ICD-10-CM | POA: Diagnosis not present

## 2018-09-28 DIAGNOSIS — R52 Pain, unspecified: Secondary | ICD-10-CM | POA: Diagnosis not present

## 2018-09-28 DIAGNOSIS — Z7901 Long term (current) use of anticoagulants: Secondary | ICD-10-CM | POA: Diagnosis not present

## 2018-09-28 DIAGNOSIS — S2241XA Multiple fractures of ribs, right side, initial encounter for closed fracture: Secondary | ICD-10-CM | POA: Diagnosis not present

## 2018-09-28 DIAGNOSIS — Z9181 History of falling: Secondary | ICD-10-CM | POA: Diagnosis not present

## 2018-09-28 DIAGNOSIS — Z79899 Other long term (current) drug therapy: Secondary | ICD-10-CM | POA: Diagnosis not present

## 2018-09-28 DIAGNOSIS — R918 Other nonspecific abnormal finding of lung field: Secondary | ICD-10-CM | POA: Diagnosis not present

## 2018-09-28 DIAGNOSIS — I5032 Chronic diastolic (congestive) heart failure: Secondary | ICD-10-CM | POA: Diagnosis not present

## 2018-09-28 DIAGNOSIS — S270XXA Traumatic pneumothorax, initial encounter: Secondary | ICD-10-CM | POA: Diagnosis not present

## 2018-09-28 DIAGNOSIS — Z1159 Encounter for screening for other viral diseases: Secondary | ICD-10-CM | POA: Diagnosis not present

## 2018-09-28 DIAGNOSIS — J181 Lobar pneumonia, unspecified organism: Secondary | ICD-10-CM | POA: Diagnosis not present

## 2018-09-28 DIAGNOSIS — I2510 Atherosclerotic heart disease of native coronary artery without angina pectoris: Secondary | ICD-10-CM | POA: Diagnosis not present

## 2018-09-28 DIAGNOSIS — E785 Hyperlipidemia, unspecified: Secondary | ICD-10-CM | POA: Diagnosis not present

## 2018-09-28 DIAGNOSIS — J939 Pneumothorax, unspecified: Secondary | ICD-10-CM | POA: Diagnosis not present

## 2018-09-28 NOTE — Telephone Encounter (Signed)
   Patients son called reporting patient experienced a fall last night around 3:30am. EMS came to her house but patient did not go to the ER. The son came to her house around 5:30am and reported that patient may have passed out. She reported palpitations at that time but denied chest pain. She is unsure if she hit her head but reports having a headache this morning. She has significant pain in her ribs as well after the fall. She has a history of atrial fibrillation and is on apixaban for stroke ppx. Given possible syncopal event with possible head strike, patient was recommended to present to the ED for further evaluation. Patient did not want to go to the ER but son was going to attempt to get her there.   Abigail Butts, PA-C 09/28/18; 7:54 AM

## 2018-09-29 DIAGNOSIS — R918 Other nonspecific abnormal finding of lung field: Secondary | ICD-10-CM | POA: Diagnosis not present

## 2018-09-29 DIAGNOSIS — S2249XA Multiple fractures of ribs, unspecified side, initial encounter for closed fracture: Secondary | ICD-10-CM | POA: Diagnosis not present

## 2018-09-29 DIAGNOSIS — J939 Pneumothorax, unspecified: Secondary | ICD-10-CM | POA: Diagnosis not present

## 2018-09-29 DIAGNOSIS — S2241XA Multiple fractures of ribs, right side, initial encounter for closed fracture: Secondary | ICD-10-CM | POA: Diagnosis not present

## 2018-09-29 DIAGNOSIS — S270XXA Traumatic pneumothorax, initial encounter: Secondary | ICD-10-CM | POA: Diagnosis not present

## 2018-09-29 DIAGNOSIS — T1490XA Injury, unspecified, initial encounter: Secondary | ICD-10-CM | POA: Diagnosis not present

## 2018-09-29 DIAGNOSIS — J9383 Other pneumothorax: Secondary | ICD-10-CM | POA: Diagnosis not present

## 2018-09-30 ENCOUNTER — Other Ambulatory Visit: Payer: Self-pay

## 2018-09-30 DIAGNOSIS — S2241XA Multiple fractures of ribs, right side, initial encounter for closed fracture: Secondary | ICD-10-CM | POA: Diagnosis not present

## 2018-09-30 DIAGNOSIS — I5032 Chronic diastolic (congestive) heart failure: Secondary | ICD-10-CM | POA: Diagnosis not present

## 2018-09-30 DIAGNOSIS — I10 Essential (primary) hypertension: Secondary | ICD-10-CM | POA: Diagnosis not present

## 2018-09-30 DIAGNOSIS — S270XXA Traumatic pneumothorax, initial encounter: Secondary | ICD-10-CM | POA: Diagnosis not present

## 2018-09-30 DIAGNOSIS — J939 Pneumothorax, unspecified: Secondary | ICD-10-CM | POA: Diagnosis not present

## 2018-09-30 DIAGNOSIS — I482 Chronic atrial fibrillation, unspecified: Secondary | ICD-10-CM | POA: Diagnosis not present

## 2018-09-30 DIAGNOSIS — J181 Lobar pneumonia, unspecified organism: Secondary | ICD-10-CM | POA: Diagnosis not present

## 2018-09-30 NOTE — Patient Outreach (Signed)
[facility]) Care Management  09/30/2018  Nikayla Madaris 12-Jul-1929 [id number]    Late entry  No response from calls or letter mailed to patient. Case is being closed at this time.   Lazaro Arms RN, BSN, [facility] Direct Dial:  [phone]  Fax: [phone]

## 2018-10-01 DIAGNOSIS — S2249XA Multiple fractures of ribs, unspecified side, initial encounter for closed fracture: Secondary | ICD-10-CM | POA: Diagnosis not present

## 2018-10-01 DIAGNOSIS — S2241XA Multiple fractures of ribs, right side, initial encounter for closed fracture: Secondary | ICD-10-CM | POA: Diagnosis not present

## 2018-10-01 DIAGNOSIS — J939 Pneumothorax, unspecified: Secondary | ICD-10-CM | POA: Diagnosis not present

## 2018-10-01 DIAGNOSIS — I5032 Chronic diastolic (congestive) heart failure: Secondary | ICD-10-CM | POA: Diagnosis not present

## 2018-10-01 DIAGNOSIS — S270XXA Traumatic pneumothorax, initial encounter: Secondary | ICD-10-CM | POA: Diagnosis not present

## 2018-10-01 DIAGNOSIS — I10 Essential (primary) hypertension: Secondary | ICD-10-CM | POA: Diagnosis not present

## 2018-10-01 DIAGNOSIS — I482 Chronic atrial fibrillation, unspecified: Secondary | ICD-10-CM | POA: Diagnosis not present

## 2018-10-02 DIAGNOSIS — I482 Chronic atrial fibrillation, unspecified: Secondary | ICD-10-CM | POA: Diagnosis not present

## 2018-10-02 DIAGNOSIS — I10 Essential (primary) hypertension: Secondary | ICD-10-CM | POA: Diagnosis not present

## 2018-10-02 DIAGNOSIS — I5032 Chronic diastolic (congestive) heart failure: Secondary | ICD-10-CM | POA: Diagnosis not present

## 2018-10-02 DIAGNOSIS — R0789 Other chest pain: Secondary | ICD-10-CM | POA: Diagnosis not present

## 2018-10-02 DIAGNOSIS — S2241XA Multiple fractures of ribs, right side, initial encounter for closed fracture: Secondary | ICD-10-CM | POA: Diagnosis not present

## 2018-10-02 DIAGNOSIS — S270XXA Traumatic pneumothorax, initial encounter: Secondary | ICD-10-CM | POA: Diagnosis not present

## 2018-10-03 DIAGNOSIS — I482 Chronic atrial fibrillation, unspecified: Secondary | ICD-10-CM | POA: Diagnosis not present

## 2018-10-03 DIAGNOSIS — I10 Essential (primary) hypertension: Secondary | ICD-10-CM | POA: Diagnosis not present

## 2018-10-03 DIAGNOSIS — I5032 Chronic diastolic (congestive) heart failure: Secondary | ICD-10-CM | POA: Diagnosis not present

## 2018-10-03 DIAGNOSIS — S2241XA Multiple fractures of ribs, right side, initial encounter for closed fracture: Secondary | ICD-10-CM | POA: Diagnosis not present

## 2018-10-03 DIAGNOSIS — S270XXA Traumatic pneumothorax, initial encounter: Secondary | ICD-10-CM | POA: Diagnosis not present

## 2018-10-04 DIAGNOSIS — S270XXA Traumatic pneumothorax, initial encounter: Secondary | ICD-10-CM | POA: Diagnosis not present

## 2018-10-04 DIAGNOSIS — I482 Chronic atrial fibrillation, unspecified: Secondary | ICD-10-CM | POA: Diagnosis not present

## 2018-10-04 DIAGNOSIS — I5032 Chronic diastolic (congestive) heart failure: Secondary | ICD-10-CM | POA: Diagnosis not present

## 2018-10-04 DIAGNOSIS — I10 Essential (primary) hypertension: Secondary | ICD-10-CM | POA: Diagnosis not present

## 2018-10-04 DIAGNOSIS — S2241XA Multiple fractures of ribs, right side, initial encounter for closed fracture: Secondary | ICD-10-CM | POA: Diagnosis not present

## 2018-10-05 DIAGNOSIS — S2241XD Multiple fractures of ribs, right side, subsequent encounter for fracture with routine healing: Secondary | ICD-10-CM | POA: Diagnosis not present

## 2018-10-05 DIAGNOSIS — Z1159 Encounter for screening for other viral diseases: Secondary | ICD-10-CM | POA: Diagnosis not present

## 2018-10-05 DIAGNOSIS — G4730 Sleep apnea, unspecified: Secondary | ICD-10-CM | POA: Diagnosis not present

## 2018-10-05 DIAGNOSIS — S270XXD Traumatic pneumothorax, subsequent encounter: Secondary | ICD-10-CM | POA: Diagnosis not present

## 2018-10-05 DIAGNOSIS — Z883 Allergy status to other anti-infective agents status: Secondary | ICD-10-CM | POA: Diagnosis not present

## 2018-10-05 DIAGNOSIS — Z79899 Other long term (current) drug therapy: Secondary | ICD-10-CM | POA: Diagnosis not present

## 2018-10-05 DIAGNOSIS — J45909 Unspecified asthma, uncomplicated: Secondary | ICD-10-CM | POA: Diagnosis not present

## 2018-10-05 DIAGNOSIS — U071 COVID-19: Secondary | ICD-10-CM | POA: Diagnosis not present

## 2018-10-05 DIAGNOSIS — I2510 Atherosclerotic heart disease of native coronary artery without angina pectoris: Secondary | ICD-10-CM | POA: Diagnosis not present

## 2018-10-05 DIAGNOSIS — S270XXA Traumatic pneumothorax, initial encounter: Secondary | ICD-10-CM | POA: Diagnosis not present

## 2018-10-05 DIAGNOSIS — R2681 Unsteadiness on feet: Secondary | ICD-10-CM | POA: Diagnosis not present

## 2018-10-05 DIAGNOSIS — L03116 Cellulitis of left lower limb: Secondary | ICD-10-CM | POA: Diagnosis not present

## 2018-10-05 DIAGNOSIS — I872 Venous insufficiency (chronic) (peripheral): Secondary | ICD-10-CM | POA: Diagnosis not present

## 2018-10-05 DIAGNOSIS — Z743 Need for continuous supervision: Secondary | ICD-10-CM | POA: Diagnosis not present

## 2018-10-05 DIAGNOSIS — E119 Type 2 diabetes mellitus without complications: Secondary | ICD-10-CM | POA: Diagnosis not present

## 2018-10-05 DIAGNOSIS — S270XXS Traumatic pneumothorax, sequela: Secondary | ICD-10-CM | POA: Diagnosis not present

## 2018-10-05 DIAGNOSIS — L03115 Cellulitis of right lower limb: Secondary | ICD-10-CM | POA: Diagnosis not present

## 2018-10-05 DIAGNOSIS — R0902 Hypoxemia: Secondary | ICD-10-CM | POA: Diagnosis not present

## 2018-10-05 DIAGNOSIS — R279 Unspecified lack of coordination: Secondary | ICD-10-CM | POA: Diagnosis not present

## 2018-10-05 DIAGNOSIS — S2241XS Multiple fractures of ribs, right side, sequela: Secondary | ICD-10-CM | POA: Diagnosis not present

## 2018-10-05 DIAGNOSIS — I4892 Unspecified atrial flutter: Secondary | ICD-10-CM | POA: Diagnosis not present

## 2018-10-05 DIAGNOSIS — Z885 Allergy status to narcotic agent status: Secondary | ICD-10-CM | POA: Diagnosis not present

## 2018-10-05 DIAGNOSIS — W19XXXD Unspecified fall, subsequent encounter: Secondary | ICD-10-CM | POA: Diagnosis not present

## 2018-10-05 DIAGNOSIS — Z884 Allergy status to anesthetic agent status: Secondary | ICD-10-CM | POA: Diagnosis not present

## 2018-10-05 DIAGNOSIS — R52 Pain, unspecified: Secondary | ICD-10-CM | POA: Diagnosis not present

## 2018-10-05 DIAGNOSIS — I482 Chronic atrial fibrillation, unspecified: Secondary | ICD-10-CM | POA: Diagnosis not present

## 2018-10-05 DIAGNOSIS — Z9114 Patient's other noncompliance with medication regimen: Secondary | ICD-10-CM | POA: Diagnosis not present

## 2018-10-05 DIAGNOSIS — I10 Essential (primary) hypertension: Secondary | ICD-10-CM | POA: Diagnosis not present

## 2018-10-05 DIAGNOSIS — M109 Gout, unspecified: Secondary | ICD-10-CM | POA: Diagnosis not present

## 2018-10-05 DIAGNOSIS — I509 Heart failure, unspecified: Secondary | ICD-10-CM | POA: Diagnosis not present

## 2018-10-05 DIAGNOSIS — M059 Rheumatoid arthritis with rheumatoid factor, unspecified: Secondary | ICD-10-CM | POA: Diagnosis not present

## 2018-10-05 DIAGNOSIS — Z7901 Long term (current) use of anticoagulants: Secondary | ICD-10-CM | POA: Diagnosis not present

## 2018-10-05 DIAGNOSIS — S2241XA Multiple fractures of ribs, right side, initial encounter for closed fracture: Secondary | ICD-10-CM | POA: Diagnosis not present

## 2018-10-05 DIAGNOSIS — I4891 Unspecified atrial fibrillation: Secondary | ICD-10-CM | POA: Diagnosis not present

## 2018-10-05 DIAGNOSIS — I779 Disorder of arteries and arterioles, unspecified: Secondary | ICD-10-CM | POA: Diagnosis not present

## 2018-10-05 DIAGNOSIS — M069 Rheumatoid arthritis, unspecified: Secondary | ICD-10-CM | POA: Diagnosis not present

## 2018-10-05 DIAGNOSIS — Z950 Presence of cardiac pacemaker: Secondary | ICD-10-CM | POA: Diagnosis not present

## 2018-10-05 DIAGNOSIS — Z7409 Other reduced mobility: Secondary | ICD-10-CM | POA: Diagnosis not present

## 2018-10-05 DIAGNOSIS — I776 Arteritis, unspecified: Secondary | ICD-10-CM | POA: Diagnosis not present

## 2018-10-05 DIAGNOSIS — I5032 Chronic diastolic (congestive) heart failure: Secondary | ICD-10-CM | POA: Diagnosis not present

## 2018-10-05 DIAGNOSIS — K279 Peptic ulcer, site unspecified, unspecified as acute or chronic, without hemorrhage or perforation: Secondary | ICD-10-CM | POA: Diagnosis not present

## 2018-10-05 DIAGNOSIS — Z9181 History of falling: Secondary | ICD-10-CM | POA: Diagnosis not present

## 2018-10-05 DIAGNOSIS — R609 Edema, unspecified: Secondary | ICD-10-CM | POA: Diagnosis not present

## 2018-10-05 DIAGNOSIS — Z886 Allergy status to analgesic agent status: Secondary | ICD-10-CM | POA: Diagnosis not present

## 2018-10-05 DIAGNOSIS — I110 Hypertensive heart disease with heart failure: Secondary | ICD-10-CM | POA: Diagnosis not present

## 2018-10-05 DIAGNOSIS — E785 Hyperlipidemia, unspecified: Secondary | ICD-10-CM | POA: Diagnosis not present

## 2018-10-07 DIAGNOSIS — R2681 Unsteadiness on feet: Secondary | ICD-10-CM | POA: Diagnosis not present

## 2018-10-07 DIAGNOSIS — M059 Rheumatoid arthritis with rheumatoid factor, unspecified: Secondary | ICD-10-CM | POA: Diagnosis not present

## 2018-10-07 DIAGNOSIS — K279 Peptic ulcer, site unspecified, unspecified as acute or chronic, without hemorrhage or perforation: Secondary | ICD-10-CM | POA: Diagnosis not present

## 2018-10-12 DIAGNOSIS — S2241XS Multiple fractures of ribs, right side, sequela: Secondary | ICD-10-CM | POA: Diagnosis not present

## 2018-10-12 DIAGNOSIS — R2681 Unsteadiness on feet: Secondary | ICD-10-CM | POA: Diagnosis not present

## 2018-10-12 DIAGNOSIS — Z7409 Other reduced mobility: Secondary | ICD-10-CM | POA: Diagnosis not present

## 2018-10-12 DIAGNOSIS — S270XXS Traumatic pneumothorax, sequela: Secondary | ICD-10-CM | POA: Diagnosis not present

## 2018-10-21 DIAGNOSIS — I872 Venous insufficiency (chronic) (peripheral): Secondary | ICD-10-CM | POA: Diagnosis not present

## 2018-10-21 DIAGNOSIS — S2241XS Multiple fractures of ribs, right side, sequela: Secondary | ICD-10-CM | POA: Diagnosis not present

## 2018-10-21 DIAGNOSIS — S270XXS Traumatic pneumothorax, sequela: Secondary | ICD-10-CM | POA: Diagnosis not present

## 2018-10-21 DIAGNOSIS — R2681 Unsteadiness on feet: Secondary | ICD-10-CM | POA: Diagnosis not present

## 2018-10-27 DIAGNOSIS — I2510 Atherosclerotic heart disease of native coronary artery without angina pectoris: Secondary | ICD-10-CM | POA: Diagnosis not present

## 2018-10-27 DIAGNOSIS — I872 Venous insufficiency (chronic) (peripheral): Secondary | ICD-10-CM | POA: Diagnosis not present

## 2018-10-27 DIAGNOSIS — E119 Type 2 diabetes mellitus without complications: Secondary | ICD-10-CM | POA: Diagnosis not present

## 2018-10-27 DIAGNOSIS — I509 Heart failure, unspecified: Secondary | ICD-10-CM | POA: Diagnosis not present

## 2018-10-30 ENCOUNTER — Telehealth: Payer: Self-pay | Admitting: Family Medicine

## 2018-10-30 NOTE — Telephone Encounter (Signed)
Received notification from the [facility] health that patient's weight was up by 6 pounds compared to yesterday.  Called patient directly and spoke to her at 5:20 PM today.  She did note that she she felt a little more swollen than usual and she has been a little bit more short of breath but says she is also been more active trying to do some light housework.  She was evidently recently hospitalized and in a rehab center and was just discharged.  Unfortunately I do not have access to any of those notes.  She does have a history of diastolic heart failure and seems somewhat short of breath on the phone.  She agreed to take an extra diuretic this evening and call if she felt like she was not improving.  We discussed weighing herself daily and trying to get the 6 pounds off.  She says she is pretty sure that her furosemide dose was decreased during her hospitalization and at discharge.  Though she previously was taking 80 mg a day.  She still has some of the 80 mg tabs and I encouraged her to take 1 of those.

## 2018-11-03 ENCOUNTER — Telehealth: Payer: Self-pay | Admitting: Family Medicine

## 2018-11-03 ENCOUNTER — Encounter: Payer: Self-pay | Admitting: Internal Medicine

## 2018-11-03 NOTE — Telephone Encounter (Signed)
error 

## 2018-11-03 NOTE — Telephone Encounter (Signed)
Left VM for Pt to return clinic call.  

## 2018-11-03 NOTE — Telephone Encounter (Signed)
Received home health certification of care plan. Patient had traumatic pneumothorax with multiple rib fractures after a fall.  Also she has chronic diastolic heart failure and chronic atrial fibrillation.  Due to Medicare rules we need to have some sort of office visit to certify home health services.  Due to COVID-19 this can be done remotely using a video visit or a phone visit.  Please schedule a phone or video visit with either myself or 1 of the other doctors in the next week or 2.  I have the form to complete so it makes sense to follow-up with me.  Please call [phone] and schedule a follow-up appointment via phone or video or in person with Dr. Georgina Snell or 1 of his partners.

## 2018-11-06 ENCOUNTER — Encounter: Payer: Medicare Other | Admitting: Internal Medicine

## 2018-11-08 DIAGNOSIS — R609 Edema, unspecified: Secondary | ICD-10-CM | POA: Diagnosis not present

## 2018-11-08 DIAGNOSIS — I5032 Chronic diastolic (congestive) heart failure: Secondary | ICD-10-CM | POA: Diagnosis not present

## 2018-11-08 DIAGNOSIS — I110 Hypertensive heart disease with heart failure: Secondary | ICD-10-CM | POA: Diagnosis not present

## 2018-11-10 ENCOUNTER — Encounter: Payer: Self-pay | Admitting: Internal Medicine

## 2018-11-10 ENCOUNTER — Ambulatory Visit (INDEPENDENT_AMBULATORY_CARE_PROVIDER_SITE_OTHER): Payer: Medicare Other | Admitting: Internal Medicine

## 2018-11-10 ENCOUNTER — Telehealth: Payer: Self-pay | Admitting: Internal Medicine

## 2018-11-10 ENCOUNTER — Other Ambulatory Visit (HOSPITAL_COMMUNITY)
Admission: RE | Admit: 2018-11-10 | Discharge: 2018-11-10 | Disposition: A | Discharge status: Home / Self Care | Payer: Medicare Other | Source: Ambulatory Visit | Attending: Internal Medicine | Admitting: Internal Medicine

## 2018-11-10 ENCOUNTER — Other Ambulatory Visit: Payer: Self-pay

## 2018-11-10 VITALS — BP 138/74 | HR 52 | Ht 60.0 in | Wt 216.0 lb

## 2018-11-10 DIAGNOSIS — I4821 Permanent atrial fibrillation: Secondary | ICD-10-CM

## 2018-11-10 DIAGNOSIS — Z950 Presence of cardiac pacemaker: Secondary | ICD-10-CM | POA: Diagnosis not present

## 2018-11-10 DIAGNOSIS — Z01812 Encounter for preprocedural laboratory examination: Secondary | ICD-10-CM | POA: Insufficient documentation

## 2018-11-10 DIAGNOSIS — I495 Sick sinus syndrome: Secondary | ICD-10-CM

## 2018-11-10 DIAGNOSIS — Z20828 Contact with and (suspected) exposure to other viral communicable diseases: Secondary | ICD-10-CM | POA: Diagnosis not present

## 2018-11-10 LAB — CBC WITH DIFFERENTIAL/PLATELET
Basophils Absolute: 0 10*3/uL (ref 0.0–0.2)
Basos: 0 %
EOS (ABSOLUTE): 0.1 10*3/uL (ref 0.0–0.4)
Eos: 2 %
Hematocrit: 39.7 % (ref 34.0–46.6)
Hemoglobin: 13.5 g/dL (ref 11.1–15.9)
Immature Grans (Abs): 0 10*3/uL (ref 0.0–0.1)
Immature Granulocytes: 0 %
Lymphocytes Absolute: 1.2 10*3/uL (ref 0.7–3.1)
Lymphs: 18 %
MCH: 31.3 pg (ref 26.6–33.0)
MCHC: 34 g/dL (ref 31.5–35.7)
MCV: 92 fL (ref 79–97)
Monocytes Absolute: 0.7 10*3/uL (ref 0.1–0.9)
Monocytes: 11 %
Neutrophils Absolute: 4.5 10*3/uL (ref 1.4–7.0)
Neutrophils: 69 %
Platelets: 132 10*3/uL — ABNORMAL LOW (ref 150–450)
RBC: 4.32 x10E6/uL (ref 3.77–5.28)
RDW: 13.5 % (ref 11.7–15.4)
WBC: 6.6 10*3/uL (ref 3.4–10.8)

## 2018-11-10 LAB — BASIC METABOLIC PANEL
BUN/Creatinine Ratio: 25 (ref 12–28)
BUN: 27 mg/dL (ref 8–27)
CO2: 18 mmol/L — ABNORMAL LOW (ref 20–29)
Calcium: 9.7 mg/dL (ref 8.7–10.3)
Chloride: 103 mmol/L (ref 96–106)
Creatinine, Ser: 1.06 mg/dL — ABNORMAL HIGH (ref 0.57–1.00)
GFR calc Af Amer: 55 mL/min/{1.73_m2} — ABNORMAL LOW (ref >59)
GFR calc non Af Amer: 47 mL/min/{1.73_m2} — ABNORMAL LOW (ref >59)
Glucose: 94 mg/dL (ref 65–99)
Potassium: 4.5 mmol/L (ref 3.5–5.2)
Sodium: 137 mmol/L (ref 134–144)

## 2018-11-10 NOTE — Progress Notes (Signed)
HPI Katherine Cervantes returns today for ongoing evaluation and management of her dual-chamber pacemaker. She is a very pleasant 85 year old woman with a history of sinus node dysfunction status post pacemaker insertion. She has had problems with anxiety. She does not have palpitations. She denies chest pain. She has very mild peripheral edema. Today she notes that she is been in the hospital. She has no other complaints except for occaisional dizzy spells.  Allergies  Allergen Reactions  . Aspirin Anaphylaxis  . Other Anaphylaxis  . Penicillins Anaphylaxis    Anaphylaxis  . Codeine Other (See Comments)    SOB Asthmatic issues  . Morphine And Related Other (See Comments)    Anaphylaxis  . Sulfa Antibiotics Rash  . Ether   . Levaquin [Levofloxacin In D5w] Other (See Comments)    Disoriented, Hallucinations  . Metoprolol     Possible  . Prednisone Other (See Comments)    Lethargic, rash  . Procaine      Current Outpatient Medications  Medication Sig Dispense Refill  . acetaminophen (TYLENOL) 325 MG tablet Take 2 tablets (650 mg total) by mouth every 4 (four) hours as needed for headache or mild pain.    Marland Kitchen amLODipine (NORVASC) 5 MG tablet Take 1 tablet (5 mg total) by mouth daily. 30 tablet 3  . apixaban (ELIQUIS) 5 MG TABS tablet Take 1 tablet (5 mg total) by mouth 2 (two) times daily. 60 tablet 6  . furosemide (LASIX) 80 MG tablet Take 1 tablet (80 mg total) by mouth 2 (two) times daily. 180 tablet 3  . nitroGLYCERIN (NITROSTAT) 0.4 MG SL tablet Place 1 tablet (0.4 mg total) under the tongue every 5 (five) minutes as needed for chest pain (MAX 3 TABLETS). 25 tablet 3  . potassium chloride SA (K-DUR,KLOR-CON) 20 MEQ tablet Take 1 tablet (20 mEq total) by mouth daily. 30 tablet 6  . psyllium (HYDROCIL/METAMUCIL) 95 % PACK Take 1 packet by mouth as needed for mild constipation. 240 each   . spironolactone (ALDACTONE) 25 MG tablet Take 1 tablet (25 mg total) by mouth daily. 30 tablet 6    No current facility-administered medications for this visit.      Past Medical History:  Diagnosis Date  . Anticoagulation adequate 05/22/2018  . Asthma   . Atrial fibrillation ([place]) 09/27/2011  . CAD (coronary artery disease) 09/27/2011  . Diabetes mellitus without complication ([place])   . Dyslipidemia 09/27/2011  . History of vasculitis 09/27/2011  . Hypertension 09/27/2011  . Osteoporosis 09/27/2011  . Pacemaker   . PUD (peptic ulcer disease)   . Rheumatoid arthritis(714.0) 09/27/2011    ROS:   All systems reviewed and negative except as noted in the HPI.   Past Surgical History:  Procedure Laterality Date  . ABDOMINAL HYSTERECTOMY    . ANKLE SURGERY    . APPENDECTOMY    . Carpal tunnel    . CHOLECYSTECTOMY    . PARTIAL GASTRECTOMY       Family History  Problem Relation Age of Onset  . Hypertension Mother   . Cancer Brother   . Cancer Brother      Social History   Socioeconomic History  . Marital status: Widowed    Spouse name: Not on file  . Number of children: 7  . Years of education: Not on file  . Highest education level: Not on file  Occupational History  . Not on file  Social Needs  . Financial resource strain: Not on file  .  Food insecurity    Worry: Not on file    Inability: Not on file  . Transportation needs    Medical: Not on file    Non-medical: Not on file  Tobacco Use  . Smoking status: Never Smoker  . Smokeless tobacco: Never Used  Substance and Sexual Activity  . Alcohol use: Yes    Alcohol/week: 0.0 standard drinks    Comment: Occasional  . Drug use: No  . Sexual activity: Not on file  Lifestyle  . Physical activity    Days per week: Not on file    Minutes per session: Not on file  . Stress: Not on file  Relationships  . Social Musician on phone: Not on file    Gets together: Not on file    Attends religious service: Not on file    Active member of club or organization: Not on file    Attends meetings of clubs  or organizations: Not on file    Relationship status: Not on file  . Intimate partner violence    Fear of current or ex partner: Not on file    Emotionally abused: Not on file    Physically abused: Not on file    Forced sexual activity: Not on file  Other Topics Concern  . Not on file  Social History Narrative  . Not on file     BP 138/74   Pulse (!) 52   Ht 5' (1.524 m)   Wt 216 lb (98 kg)   SpO2 98%   BMI 42.18 kg/m   Physical Exam:  Well appearing elderly woman, NAD HEENT: Unremarkable Neck:  No JVD, no thyromegally Lymphatics:  No adenopathy Back:  No CVA tenderness Lungs:  Clear with no wheezes HEART:  Regular rate rhythm, no murmurs, no rubs, no clicks Abd:  soft, positive bowel sounds, no organomegally, no rebound, no guarding Ext:  2 plus pulses, no edema, no cyanosis, no clubbing Skin:  No rashes no nodules Neuro:  CN II through XII intact, motor grossly intact  EKG - atrial fib with a slow VR and RBBB  DEVICE  Unable to communicate with.  Assess/Plan: 1. Atrial fib - her VR is controlled. She will  Hold her eliquis for 2 days. 2. PPM- her device is past EOL and cannot be communicated with. We will schedule PM gen change out. 3. HTN - Her SBP is minimally elevated. 4. Anxiety - this appears to be improved since her last visit.   Leonia Reeves.D.

## 2018-11-10 NOTE — Telephone Encounter (Signed)
  Daughter Barnetta [facility]) is calling because she wants to know if she will be able to speak with someone to find out what the instructions will be for her mother for after her cath procedure on 11/13/18. She would like to know if her moms home care nurse can start coming in to see her right after and what the care instructions will be.

## 2018-11-10 NOTE — H&P (View-Only) (Signed)
HPI Katherine Cervantes returns today for ongoing evaluation and management of her dual-chamber pacemaker. She is a very pleasant 85 year old woman with a history of sinus node dysfunction status post pacemaker insertion. She has had problems with anxiety. She does not have palpitations. She denies chest pain. She has very mild peripheral edema. Today she notes that she is been in the hospital. She has no other complaints except for occaisional dizzy spells.  Allergies  Allergen Reactions  . Aspirin Anaphylaxis  . Other Anaphylaxis  . Penicillins Anaphylaxis    Anaphylaxis  . Codeine Other (See Comments)    SOB Asthmatic issues  . Morphine And Related Other (See Comments)    Anaphylaxis  . Sulfa Antibiotics Rash  . Ether   . Levaquin [Levofloxacin In D5w] Other (See Comments)    Disoriented, Hallucinations  . Metoprolol     Possible  . Prednisone Other (See Comments)    Lethargic, rash  . Procaine      Current Outpatient Medications  Medication Sig Dispense Refill  . acetaminophen (TYLENOL) 325 MG tablet Take 2 tablets (650 mg total) by mouth every 4 (four) hours as needed for headache or mild pain.    Marland Kitchen amLODipine (NORVASC) 5 MG tablet Take 1 tablet (5 mg total) by mouth daily. 30 tablet 3  . apixaban (ELIQUIS) 5 MG TABS tablet Take 1 tablet (5 mg total) by mouth 2 (two) times daily. 60 tablet 6  . furosemide (LASIX) 80 MG tablet Take 1 tablet (80 mg total) by mouth 2 (two) times daily. 180 tablet 3  . nitroGLYCERIN (NITROSTAT) 0.4 MG SL tablet Place 1 tablet (0.4 mg total) under the tongue every 5 (five) minutes as needed for chest pain (MAX 3 TABLETS). 25 tablet 3  . potassium chloride SA (K-DUR,KLOR-CON) 20 MEQ tablet Take 1 tablet (20 mEq total) by mouth daily. 30 tablet 6  . psyllium (HYDROCIL/METAMUCIL) 95 % PACK Take 1 packet by mouth as needed for mild constipation. 240 each   . spironolactone (ALDACTONE) 25 MG tablet Take 1 tablet (25 mg total) by mouth daily. 30 tablet 6    No current facility-administered medications for this visit.      Past Medical History:  Diagnosis Date  . Anticoagulation adequate 05/22/2018  . Asthma   . Atrial fibrillation ([place]) 09/27/2011  . CAD (coronary artery disease) 09/27/2011  . Diabetes mellitus without complication ([place])   . Dyslipidemia 09/27/2011  . History of vasculitis 09/27/2011  . Hypertension 09/27/2011  . Osteoporosis 09/27/2011  . Pacemaker   . PUD (peptic ulcer disease)   . Rheumatoid arthritis(714.0) 09/27/2011    ROS:   All systems reviewed and negative except as noted in the HPI.   Past Surgical History:  Procedure Laterality Date  . ABDOMINAL HYSTERECTOMY    . ANKLE SURGERY    . APPENDECTOMY    . Carpal tunnel    . CHOLECYSTECTOMY    . PARTIAL GASTRECTOMY       Family History  Problem Relation Age of Onset  . Hypertension Mother   . Cancer Brother   . Cancer Brother      Social History   Socioeconomic History  . Marital status: Widowed    Spouse name: Not on file  . Number of children: 7  . Years of education: Not on file  . Highest education level: Not on file  Occupational History  . Not on file  Social Needs  . Financial resource strain: Not on file  .  Food insecurity    Worry: Not on file    Inability: Not on file  . Transportation needs    Medical: Not on file    Non-medical: Not on file  Tobacco Use  . Smoking status: Never Smoker  . Smokeless tobacco: Never Used  Substance and Sexual Activity  . Alcohol use: Yes    Alcohol/week: 0.0 standard drinks    Comment: Occasional  . Drug use: No  . Sexual activity: Not on file  Lifestyle  . Physical activity    Days per week: Not on file    Minutes per session: Not on file  . Stress: Not on file  Relationships  . Social Musician on phone: Not on file    Gets together: Not on file    Attends religious service: Not on file    Active member of club or organization: Not on file    Attends meetings of clubs  or organizations: Not on file    Relationship status: Not on file  . Intimate partner violence    Fear of current or ex partner: Not on file    Emotionally abused: Not on file    Physically abused: Not on file    Forced sexual activity: Not on file  Other Topics Concern  . Not on file  Social History Narrative  . Not on file     BP 138/74   Pulse (!) 52   Ht 5' (1.524 m)   Wt 216 lb (98 kg)   SpO2 98%   BMI 42.18 kg/m   Physical Exam:  Well appearing elderly woman, NAD HEENT: Unremarkable Neck:  No JVD, no thyromegally Lymphatics:  No adenopathy Back:  No CVA tenderness Lungs:  Clear with no wheezes HEART:  Regular rate rhythm, no murmurs, no rubs, no clicks Abd:  soft, positive bowel sounds, no organomegally, no rebound, no guarding Ext:  2 plus pulses, no edema, no cyanosis, no clubbing Skin:  No rashes no nodules Neuro:  CN II through XII intact, motor grossly intact  EKG - atrial fib with a slow VR and RBBB  DEVICE  Unable to communicate with.  Assess/Plan: 1. Atrial fib - her VR is controlled. She will  Hold her eliquis for 2 days. 2. PPM- her device is past EOL and cannot be communicated with. We will schedule PM gen change out. 3. HTN - Her SBP is minimally elevated. 4. Anxiety - this appears to be improved since her last visit.   Leonia Reeves.D.

## 2018-11-10 NOTE — Patient Instructions (Signed)
Medication Instructions:  Your physician recommends that you continue on your current medications as directed. Please refer to the Current Medication list given to you today.  Labwork: You will get lab work today:  BMP and CBC  Testing/Procedures: None ordered.  Follow-Up: See instruction letter for directions  Any Other Special Instructions Will Be Listed Below (If Applicable).  If you need a refill on your cardiac medications before your next appointment, please call your pharmacy.

## 2018-11-11 LAB — NOVEL CORONAVIRUS, NAA (HOSP ORDER, SEND-OUT TO REF LAB; TAT 18-24 HRS): SARS-CoV-2, NAA: NOT DETECTED

## 2018-11-11 NOTE — Telephone Encounter (Signed)
Returned call to daughter.  Advised daughter to review letter given to Pt at [place] office visit.  Daughter has the letter for review.    Pt has a home health  Nurse and PT/OT.  Advised she should not have any physical restrictions after her procedure since it's a generator change.  Advised home health nurse will not need to provide any wound care after the procedure.  Advised they will get wound care instructions upon discharge after their procedure.  Daughter indicates understanding.

## 2018-11-13 ENCOUNTER — Other Ambulatory Visit: Payer: Self-pay

## 2018-11-13 ENCOUNTER — Encounter (HOSPITAL_COMMUNITY)
Admission: RE | Disposition: A | Discharge status: Home / Self Care | Payer: Self-pay | Source: Home / Self Care | Attending: Internal Medicine

## 2018-11-13 ENCOUNTER — Ambulatory Visit (HOSPITAL_COMMUNITY)
Admission: RE | Admit: 2018-11-13 | Discharge: 2018-11-13 | Disposition: A | Discharge status: Home / Self Care | Payer: Medicare Other | Attending: Internal Medicine | Admitting: Internal Medicine

## 2018-11-13 DIAGNOSIS — E785 Hyperlipidemia, unspecified: Secondary | ICD-10-CM | POA: Diagnosis not present

## 2018-11-13 DIAGNOSIS — I4891 Unspecified atrial fibrillation: Secondary | ICD-10-CM | POA: Insufficient documentation

## 2018-11-13 DIAGNOSIS — I10 Essential (primary) hypertension: Secondary | ICD-10-CM | POA: Insufficient documentation

## 2018-11-13 DIAGNOSIS — E119 Type 2 diabetes mellitus without complications: Secondary | ICD-10-CM | POA: Insufficient documentation

## 2018-11-13 DIAGNOSIS — Z903 Acquired absence of stomach [part of]: Secondary | ICD-10-CM | POA: Insufficient documentation

## 2018-11-13 DIAGNOSIS — Z8249 Family history of ischemic heart disease and other diseases of the circulatory system: Secondary | ICD-10-CM | POA: Diagnosis not present

## 2018-11-13 DIAGNOSIS — Z79899 Other long term (current) drug therapy: Secondary | ICD-10-CM | POA: Insufficient documentation

## 2018-11-13 DIAGNOSIS — I2510 Atherosclerotic heart disease of native coronary artery without angina pectoris: Secondary | ICD-10-CM | POA: Insufficient documentation

## 2018-11-13 DIAGNOSIS — Z7901 Long term (current) use of anticoagulants: Secondary | ICD-10-CM | POA: Insufficient documentation

## 2018-11-13 DIAGNOSIS — I495 Sick sinus syndrome: Secondary | ICD-10-CM

## 2018-11-13 DIAGNOSIS — Z90710 Acquired absence of both cervix and uterus: Secondary | ICD-10-CM | POA: Diagnosis not present

## 2018-11-13 DIAGNOSIS — M1990 Unspecified osteoarthritis, unspecified site: Secondary | ICD-10-CM | POA: Insufficient documentation

## 2018-11-13 DIAGNOSIS — Z45010 Encounter for checking and testing of cardiac pacemaker pulse generator [battery]: Secondary | ICD-10-CM | POA: Insufficient documentation

## 2018-11-13 DIAGNOSIS — Z950 Presence of cardiac pacemaker: Secondary | ICD-10-CM | POA: Diagnosis present

## 2018-11-13 DIAGNOSIS — M069 Rheumatoid arthritis, unspecified: Secondary | ICD-10-CM | POA: Insufficient documentation

## 2018-11-13 HISTORY — PX: PPM GENERATOR CHANGEOUT: EP1233

## 2018-11-13 LAB — GLUCOSE, CAPILLARY
Glucose-Capillary: 102 mg/dL — ABNORMAL HIGH (ref 70–99)
Glucose-Capillary: 99 mg/dL (ref 70–99)
Glucose-Capillary: 94 mg/dL (ref 70–99)

## 2018-11-13 LAB — SURGICAL PCR SCREEN
MRSA, PCR: NEGATIVE
Staphylococcus aureus: NEGATIVE

## 2018-11-13 SURGERY — PPM GENERATOR CHANGEOUT

## 2018-11-13 MED ORDER — LIDOCAINE HCL (PF) 1 % IJ SOLN
INTRAMUSCULAR | Status: DC | PRN
  Administered 2018-11-13: 40 mL

## 2018-11-13 MED ORDER — ACETAMINOPHEN 325 MG PO TABS: 325.0000 mg | ORAL_TABLET | ORAL | Status: DC | PRN

## 2018-11-13 MED ORDER — VANCOMYCIN HCL IN DEXTROSE 1-5 GM/200ML-% IV SOLN
INTRAVENOUS | Status: AC
  Filled 2018-11-13: qty 200

## 2018-11-13 MED ORDER — VANCOMYCIN HCL IN DEXTROSE 1-5 GM/200ML-% IV SOLN
1000.0000 mg | INTRAVENOUS | Status: AC
  Administered 2018-11-13: 1000 mg via INTRAVENOUS

## 2018-11-13 MED ORDER — SODIUM CHLORIDE 0.9 % IV SOLN: INTRAVENOUS | Status: DC

## 2018-11-13 MED ORDER — LIDOCAINE HCL (PF) 1 % IJ SOLN
INTRAMUSCULAR | Status: AC
  Filled 2018-11-13: qty 60

## 2018-11-13 MED ORDER — ONDANSETRON HCL 4 MG/2ML IJ SOLN: 4.0000 mg | Freq: Four times a day (QID) | INTRAMUSCULAR | Status: DC | PRN

## 2018-11-13 MED ORDER — MUPIROCIN 2 % EX OINT
TOPICAL_OINTMENT | CUTANEOUS | Status: AC
  Filled 2018-11-13: qty 22

## 2018-11-13 MED ORDER — CHLORHEXIDINE GLUCONATE 4 % EX LIQD: 60.0000 mL | Freq: Once | CUTANEOUS | Status: DC

## 2018-11-13 MED ORDER — SODIUM CHLORIDE 0.9 % IV SOLN
INTRAVENOUS | Status: AC
  Filled 2018-11-13: qty 2

## 2018-11-13 MED ORDER — MUPIROCIN 2 % EX OINT: 1.0000 "application " | TOPICAL_OINTMENT | Freq: Once | CUTANEOUS | Status: DC

## 2018-11-13 MED ORDER — SODIUM CHLORIDE 0.9 % IV SOLN
80.0000 mg | INTRAVENOUS | Status: AC
  Administered 2018-11-13: 80 mg

## 2018-11-13 SURGICAL SUPPLY — 4 items
CABLE SURGICAL S-101-97-12 (CABLE) ×2 IMPLANT
PACEMAKER ASSURITY DR-RF (Pacemaker) ×1 IMPLANT
PAD PRO RADIOLUCENT 2001M-C (PAD) ×2 IMPLANT
TRAY PACEMAKER INSERTION (PACKS) ×2 IMPLANT

## 2018-11-13 NOTE — Interval H&P Note (Signed)
History and Physical Interval Note:  11/13/2018 1:23 PM  Katherine Cervantes  has presented today for surgery, with the diagnosis of eri.  The various methods of treatment have been discussed with the patient and family. After consideration of risks, benefits and other options for treatment, the patient has consented to  Procedure(s): PPM GENERATOR CHANGEOUT (N/A) as a surgical intervention.  The patient's history has been reviewed, patient examined, no change in status, stable for surgery.  I have reviewed the patient's chart and labs.  Questions were answered to the patient's satisfaction.     Cristopher Peru

## 2018-11-13 NOTE — Discharge Instructions (Signed)
Post procedure care instructions °Keep incision clean and dry for 10 days. °No driving for 2 days.  °You can remove outer dressing tomorrow. °Leave steri-strips (little pieces of tape) on until seen in the office for wound check appointment. °Call the office ([phone]) for redness, drainage, swelling, or fever. ° °

## 2018-11-16 ENCOUNTER — Encounter (HOSPITAL_COMMUNITY): Payer: Self-pay | Admitting: Internal Medicine

## 2018-11-23 ENCOUNTER — Telehealth: Payer: Self-pay | Admitting: Internal Medicine

## 2018-11-23 NOTE — Telephone Encounter (Signed)
New message   Per Dellis Filbert has questions about post surgery for patient. Please call.

## 2018-11-23 NOTE — Telephone Encounter (Signed)
Confirmed Pt had gen change vs new pacemaker placement.  No restrictions.

## 2018-11-26 ENCOUNTER — Ambulatory Visit (INDEPENDENT_AMBULATORY_CARE_PROVIDER_SITE_OTHER): Payer: Medicare Other | Admitting: Student

## 2018-11-26 ENCOUNTER — Other Ambulatory Visit: Payer: Self-pay

## 2018-11-26 DIAGNOSIS — I495 Sick sinus syndrome: Secondary | ICD-10-CM

## 2018-11-26 LAB — CUP PACEART INCLINIC DEVICE CHECK
Battery Remaining Longevity: 121 mo
Battery Voltage: 3.07 V
Brady Statistic RA Percent Paced: 0 %
Brady Statistic RV Percent Paced: 88 %
Date Time Interrogation Session: 20201015145218
Implantable Lead Implant Date: 20090521
Implantable Lead Implant Date: 20090521
Implantable Lead Location: 753859
Implantable Lead Location: 753860
Implantable Pulse Generator Implant Date: 20201002
Lead Channel Impedance Value: 400 Ohm
Lead Channel Pacing Threshold Amplitude: 1.25 V
Lead Channel Pacing Threshold Pulse Width: 0.7 ms
Lead Channel Sensing Intrinsic Amplitude: 10.1 mV
Lead Channel Setting Pacing Amplitude: 1.5 V
Lead Channel Setting Pacing Pulse Width: 0.7 ms
Lead Channel Setting Sensing Sensitivity: 2 mV
Pulse Gen Model: 2272
Pulse Gen Serial Number: 9159208

## 2018-11-26 NOTE — Progress Notes (Signed)
Wound check appointment. Steri-strips removed. Wound without redness or edema. Incision edges approximated, wound well healed. Normal device function. Thresholds, sensing, and impedances consistent with implant measurements. Threshold stable for chronic RV lead. Histogram distribution appropriate for patient and level of activity. VVI with permanent Afib. No high ventricular rates noted. Patient educated about wound care. ROV in 3 months with Dr. Lovena Le.

## 2018-12-08 DIAGNOSIS — I5032 Chronic diastolic (congestive) heart failure: Secondary | ICD-10-CM | POA: Diagnosis not present

## 2018-12-08 DIAGNOSIS — R609 Edema, unspecified: Secondary | ICD-10-CM | POA: Diagnosis not present

## 2018-12-08 DIAGNOSIS — I110 Hypertensive heart disease with heart failure: Secondary | ICD-10-CM | POA: Diagnosis not present

## 2018-12-09 ENCOUNTER — Telehealth: Payer: Self-pay

## 2018-12-09 NOTE — Telephone Encounter (Signed)
Left a detailed vm msg for RN Danielle regarding pt's last office visit. Direct call back info provided.

## 2018-12-09 NOTE — Telephone Encounter (Signed)
RN Danielle left a vm msg stating that she needs to confirm pt's last visit day. No other details noted in msg. Pls advise, thanks.

## 2018-12-09 NOTE — Telephone Encounter (Signed)
televisit 06/29/18

## 2018-12-30 ENCOUNTER — Encounter: Payer: Self-pay | Admitting: Osteopathic Medicine

## 2018-12-30 ENCOUNTER — Ambulatory Visit (INDEPENDENT_AMBULATORY_CARE_PROVIDER_SITE_OTHER): Payer: Medicare Other | Admitting: Osteopathic Medicine

## 2018-12-30 VITALS — Wt 203.0 lb

## 2018-12-30 DIAGNOSIS — R7309 Other abnormal glucose: Secondary | ICD-10-CM

## 2018-12-30 DIAGNOSIS — I509 Heart failure, unspecified: Secondary | ICD-10-CM | POA: Diagnosis not present

## 2018-12-30 DIAGNOSIS — I10 Essential (primary) hypertension: Secondary | ICD-10-CM | POA: Diagnosis not present

## 2018-12-30 DIAGNOSIS — I482 Chronic atrial fibrillation, unspecified: Secondary | ICD-10-CM | POA: Diagnosis not present

## 2018-12-30 DIAGNOSIS — I495 Sick sinus syndrome: Secondary | ICD-10-CM

## 2018-12-30 DIAGNOSIS — Z950 Presence of cardiac pacemaker: Secondary | ICD-10-CM

## 2018-12-30 NOTE — Progress Notes (Signed)
Virtual Visit via Phone  I connected with      Katherine Cervantes on 12/30/18 at 1:20 by a telemedicine application and verified that I am speaking with the correct person using two identifiers.  Patient is at home I am in office   I discussed the limitations of evaluation and management by telemedicine and the availability of in person appointments. The patient expressed understanding and agreed to proceed.  History of Present Illness: Katherine Cervantes is a 85 y.o. female who would like to discuss medication check-up    Had some questions about taking medication. Spironolactone seems to cause increased thirst. She's been eating healthy and reports good energy.        Observations/Objective: Wt 203 lb (92.1 kg)   BMI 39.65 kg/m  BP Readings from Last 3 Encounters:  11/13/18 (!) 117/94  11/10/18 138/74  06/03/18 120/80   Exam: Normal Speech.  NAD  Lab and Radiology Results No results found for this or any previous visit (from the past 72 hour(s)). No results found.     Assessment and Plan: 85 y.o. female with The primary encounter diagnosis was Elevated hemoglobin A1c measurement. Diagnoses of Sinus node dysfunction (HCC), Essential hypertension, Congestive heart failure, unspecified HF chronicity, unspecified heart failure type [facility]), Chronic atrial fibrillation (HCC), and Pacemaker were also pertinent to this visit.  We discussed diet, activity. Patient is feeling overall pretty good, following w/ cardiology. Denies history of diabetes, A1C was significantly high at one point but then in prediabetic range. She avoids too much carbs or junk food. Eats veggies, protein, heart-healthy diet, dairy.   I advised hard candy or sugar-free gum, lemon/citrus wedges to increase salivation. Id stay on meds as directed by cardiology.   Would like to check labs next visit, I reviewed the hospital labs, Glc was ok.    Follow Up Instructions: Return in about 6 months (around  06/29/2019) for ANNUAL (call week prior to visit for lab orders).    I discussed the assessment and treatment plan with the patient. The patient was provided an opportunity to ask questions and all were answered. The patient agreed with the plan and demonstrated an understanding of the instructions.   The patient was advised to call back or seek an in-person evaluation if any new concerns, if symptoms worsen or if the condition fails to improve as anticipated.  30 minutes of non-face-to-face time was provided during this encounter.      . . . . . . . . . . . . . Marland Kitchen                   Historical information moved to improve visibility of documentation.  Past Medical History:  Diagnosis Date  . Anticoagulation adequate 05/22/2018  . Asthma   . Atrial fibrillation (HCC) 09/27/2011  . CAD (coronary artery disease) 09/27/2011  . Diabetes mellitus without complication (HCC)   . Dyslipidemia 09/27/2011  . History of vasculitis 09/27/2011  . Hypertension 09/27/2011  . Osteoporosis 09/27/2011  . Pacemaker   . PUD (peptic ulcer disease)   . Rheumatoid arthritis(714.0) 09/27/2011   Past Surgical History:  Procedure Laterality Date  . ABDOMINAL HYSTERECTOMY    . ANKLE SURGERY    . APPENDECTOMY    . Carpal tunnel    . CHOLECYSTECTOMY    . PARTIAL GASTRECTOMY    . PPM GENERATOR CHANGEOUT N/A 11/13/2018   Procedure: PPM GENERATOR CHANGEOUT;  Surgeon: Marinus Maw, MD;  Location:  [place] [facility]IVE [facility];  Service: Cardiovascular;  Laterality: N/A;   Social History   Tobacco Use  . Smoking status: Never Smoker  . Smokeless tobacco: Never Used  Substance Use Topics  . Alcohol use: Yes    Alcohol/week: 0.0 standard drinks    Comment: Occasional   family history includes Cancer in her brother and brother; Hypertension in her mother.  Medications: Current Outpatient Medications  Medication Sig Dispense Refill  . acetaminophen (TYLENOL) 325 MG tablet Take 2 tablets  (650 mg total) by mouth every 4 (four) hours as needed for headache or mild pain.    Marland Kitchen amLODipine (NORVASC) 5 MG tablet Take 1 tablet (5 mg total) by mouth daily. 30 tablet 3  . apixaban (ELIQUIS) 5 MG TABS tablet Take 1 tablet (5 mg total) by mouth 2 (two) times daily. 60 tablet 6  . furosemide (LASIX) 80 MG tablet Take 1 tablet (80 mg total) by mouth 2 (two) times daily. 180 tablet 3  . nitroGLYCERIN (NITROSTAT) 0.4 MG SL tablet Place 1 tablet (0.4 mg total) under the tongue every 5 (five) minutes as needed for chest pain (MAX 3 TABLETS). 25 tablet 3  . potassium chloride SA (K-DUR,KLOR-CON) 20 MEQ tablet Take 1 tablet (20 mEq total) by mouth daily. 30 tablet 6  . psyllium (HYDROCIL/METAMUCIL) 95 % PACK Take 1 packet by mouth as needed for mild constipation. 240 each   . spironolactone (ALDACTONE) 25 MG tablet Take 1 tablet (25 mg total) by mouth daily. 30 tablet 6  . traMADol (ULTRAM) 50 MG tablet Take 50 mg by mouth every 6 (six) hours as needed for moderate pain.     No current facility-administered medications for this visit.    Allergies  Allergen Reactions  . Aspirin Anaphylaxis  . Other Anaphylaxis  . Penicillins Anaphylaxis    Anaphylaxis  . Codeine Other (See Comments)    SOB Asthmatic issues  . Morphine And Related Other (See Comments)    Anaphylaxis  . Sulfa Antibiotics Rash  . Ether   . Levaquin [Levofloxacin In D5w] Other (See Comments)    Disoriented, Hallucinations  . Metoprolol     Possible  . Prednisone Other (See Comments)    Lethargic, rash  . Procaine

## 2019-01-08 DIAGNOSIS — R609 Edema, unspecified: Secondary | ICD-10-CM | POA: Diagnosis not present

## 2019-01-08 DIAGNOSIS — I5032 Chronic diastolic (congestive) heart failure: Secondary | ICD-10-CM | POA: Diagnosis not present

## 2019-01-08 DIAGNOSIS — I110 Hypertensive heart disease with heart failure: Secondary | ICD-10-CM | POA: Diagnosis not present

## 2019-02-07 DIAGNOSIS — I5032 Chronic diastolic (congestive) heart failure: Secondary | ICD-10-CM | POA: Diagnosis not present

## 2019-02-07 DIAGNOSIS — R609 Edema, unspecified: Secondary | ICD-10-CM | POA: Diagnosis not present

## 2019-02-07 DIAGNOSIS — I110 Hypertensive heart disease with heart failure: Secondary | ICD-10-CM | POA: Diagnosis not present

## 2019-02-09 ENCOUNTER — Encounter: Payer: Self-pay | Admitting: *Deleted

## 2019-02-18 ENCOUNTER — Encounter: Payer: Medicare Other | Admitting: Internal Medicine

## 2019-03-10 DIAGNOSIS — I5032 Chronic diastolic (congestive) heart failure: Secondary | ICD-10-CM | POA: Diagnosis not present

## 2019-03-10 DIAGNOSIS — R609 Edema, unspecified: Secondary | ICD-10-CM | POA: Diagnosis not present

## 2019-03-10 DIAGNOSIS — I110 Hypertensive heart disease with heart failure: Secondary | ICD-10-CM | POA: Diagnosis not present

## 2019-05-21 ENCOUNTER — Ambulatory Visit (INDEPENDENT_AMBULATORY_CARE_PROVIDER_SITE_OTHER): Payer: Medicare Other | Admitting: *Deleted

## 2019-05-21 DIAGNOSIS — Z950 Presence of cardiac pacemaker: Secondary | ICD-10-CM

## 2019-05-31 ENCOUNTER — Telehealth: Payer: Self-pay | Admitting: *Deleted

## 2019-05-31 DIAGNOSIS — I5033 Acute on chronic diastolic (congestive) heart failure: Secondary | ICD-10-CM

## 2019-05-31 NOTE — Telephone Encounter (Signed)
Received message from [employer] reporting that patient is experiencing SOB and worsened BLE swelling. Spoke with patient. She reports that over the past couple of days she has noticed increased SOB with exertion and with talking, as well as increased fatigue x1 week. Pt sleeps sitting up in her chair at baseline. Attempted to confirm medications, but pt reports they are not nearby. Pt reports taking 2 furosemide tablets every morning, but reports she may have run out of 80mg  tablets and could be taking a different dose. Encouraged pt to check her medication bottles to ensure she is taking furosemide 80mg  BID. Pt verbalizes understanding.  Appears pt is overdue for f/u with APP/Dr. , as well as with Dr. . Will route to [facility] Triage pool for further management of CHF symptoms. ED precautions reviewed with patient for acutely worsening symptoms. Pt verbalizes understanding and agreement with plan.

## 2019-05-31 NOTE — Telephone Encounter (Signed)
Spoke with patient. Patient has been having shortness of breath for the last two days. Shortness of breath has worsened today. Patient reports she has been out of lasix for two days now. She does have refills available but hasn't called it in. Patient wants to know if she needs to increase her lasix.   Advised patient to fill prescription and begin taking medication as prescribed. Patient requests RN to call in refill. Spoke with [facility] pharmacy and called in a refill for Lasix 80mg  BID.   Patient short of breath on the phone, takes rest periods during conversation and sentences. Patient reports today was the first day she has had SOB at rest. Patient reports edema all over especially in her ankles. Patient does not weigh daily and cannot recall last known weight. Patient reports she doesn't sleep well so she cannot tell me if SOB awakens her at night. Patient sleeps sitting up at baseline.   Advised patient needs to be seen in office for SOB. Patient refused. Patient does not want an appointment, states if it gets worse she'll call back to be seen.   Patient to have daughter pick up Lasix today and will start back. Patient will call back if symptoms worsen. Urged medication compliance.  Will forward to Dr. as an [name].

## 2019-06-07 LAB — CUP PACEART REMOTE DEVICE CHECK
Battery Remaining Longevity: 124 mo
Battery Remaining Percentage: 95.5 %
Battery Voltage: 3.02 V
Brady Statistic RV Percent Paced: 90 %
Date Time Interrogation Session: 20210409032901
Implantable Lead Implant Date: 20090521
Implantable Lead Implant Date: 20090521
Implantable Lead Location: 753859
Implantable Lead Location: 753860
Implantable Pulse Generator Implant Date: 20201002
Lead Channel Impedance Value: 380 Ohm
Lead Channel Pacing Threshold Amplitude: 0.875 V
Lead Channel Pacing Threshold Pulse Width: 0.7 ms
Lead Channel Sensing Intrinsic Amplitude: 8.4 mV
Lead Channel Setting Pacing Amplitude: 1.125
Lead Channel Setting Pacing Pulse Width: 0.7 ms
Lead Channel Setting Sensing Sensitivity: 2 mV
Pulse Gen Model: 2272
Pulse Gen Serial Number: 9159208

## 2019-06-07 NOTE — Progress Notes (Signed)
PPM Remote  

## 2019-06-22 ENCOUNTER — Encounter: Payer: Medicare Other | Admitting: Internal Medicine

## 2019-08-13 ENCOUNTER — Encounter: Payer: Medicare Other | Admitting: Internal Medicine

## 2019-08-20 ENCOUNTER — Ambulatory Visit (INDEPENDENT_AMBULATORY_CARE_PROVIDER_SITE_OTHER): Payer: Medicare Other | Admitting: *Deleted

## 2019-08-20 DIAGNOSIS — I495 Sick sinus syndrome: Secondary | ICD-10-CM

## 2019-08-20 LAB — CUP PACEART REMOTE DEVICE CHECK
Battery Remaining Longevity: 121 mo
Battery Remaining Percentage: 95.5 %
Battery Voltage: 3.02 V
Brady Statistic RV Percent Paced: 91 %
Date Time Interrogation Session: 20210709020019
Implantable Lead Implant Date: 20090521
Implantable Lead Implant Date: 20090521
Implantable Lead Location: 753859
Implantable Lead Location: 753860
Implantable Pulse Generator Implant Date: 20201002
Lead Channel Impedance Value: 380 Ohm
Lead Channel Pacing Threshold Amplitude: 0.875 V
Lead Channel Pacing Threshold Pulse Width: 0.7 ms
Lead Channel Sensing Intrinsic Amplitude: 8.8 mV
Lead Channel Setting Pacing Amplitude: 1.125
Lead Channel Setting Pacing Pulse Width: 0.7 ms
Lead Channel Setting Sensing Sensitivity: 2 mV
Pulse Gen Model: 2272
Pulse Gen Serial Number: 9159208

## 2019-08-23 NOTE — Progress Notes (Signed)
Remote pacemaker transmission.   

## 2019-11-19 ENCOUNTER — Ambulatory Visit (INDEPENDENT_AMBULATORY_CARE_PROVIDER_SITE_OTHER): Payer: Medicare Other

## 2019-11-19 DIAGNOSIS — I495 Sick sinus syndrome: Secondary | ICD-10-CM

## 2019-11-19 LAB — CUP PACEART REMOTE DEVICE CHECK
Battery Remaining Longevity: 119 mo
Battery Remaining Percentage: 95.5 %
Battery Voltage: 3.02 V
Brady Statistic RV Percent Paced: 92 %
Date Time Interrogation Session: 20211008020024
Implantable Lead Implant Date: 20090521
Implantable Lead Implant Date: 20090521
Implantable Lead Location: 753859
Implantable Lead Location: 753860
Implantable Pulse Generator Implant Date: 20201002
Lead Channel Impedance Value: 380 Ohm
Lead Channel Pacing Threshold Amplitude: 0.875 V
Lead Channel Pacing Threshold Pulse Width: 0.7 ms
Lead Channel Sensing Intrinsic Amplitude: 7.9 mV
Lead Channel Setting Pacing Amplitude: 1.125
Lead Channel Setting Pacing Pulse Width: 0.7 ms
Lead Channel Setting Sensing Sensitivity: 2 mV
Pulse Gen Model: 2272
Pulse Gen Serial Number: 9159208

## 2019-11-23 NOTE — Progress Notes (Signed)
Remote pacemaker transmission.   

## 2019-12-22 ENCOUNTER — Encounter: Payer: Self-pay | Admitting: Physician Assistant

## 2019-12-22 ENCOUNTER — Ambulatory Visit (INDEPENDENT_AMBULATORY_CARE_PROVIDER_SITE_OTHER): Payer: Medicare Other | Admitting: Physician Assistant

## 2019-12-22 VITALS — BP 158/53 | HR 60 | Temp 97.3°F | Ht 60.0 in

## 2019-12-22 DIAGNOSIS — I872 Venous insufficiency (chronic) (peripheral): Secondary | ICD-10-CM | POA: Diagnosis not present

## 2019-12-22 DIAGNOSIS — I10 Essential (primary) hypertension: Secondary | ICD-10-CM

## 2019-12-22 DIAGNOSIS — L03115 Cellulitis of right lower limb: Secondary | ICD-10-CM

## 2019-12-22 DIAGNOSIS — I495 Sick sinus syndrome: Secondary | ICD-10-CM | POA: Diagnosis not present

## 2019-12-22 DIAGNOSIS — R0602 Shortness of breath: Secondary | ICD-10-CM

## 2019-12-22 DIAGNOSIS — R4589 Other symptoms and signs involving emotional state: Secondary | ICD-10-CM

## 2019-12-22 DIAGNOSIS — R0601 Orthopnea: Secondary | ICD-10-CM

## 2019-12-22 MED ORDER — DOXYCYCLINE HYCLATE 100 MG PO TABS: 100.0000 mg | ORAL_TABLET | Freq: Two times a day (BID) | ORAL | 0 refills | Status: DC

## 2019-12-22 NOTE — Patient Instructions (Signed)

## 2019-12-22 NOTE — Progress Notes (Signed)
Subjective:    Patient ID: Katherine Cervantes, female    DOB: 1929/11/20, 86 y.o.   MRN: [id number]  HPI  Pt is a 86 yo obese female with CHF, A.fib, CAD, chronic venous stasis, HTN, T2DM, RA who presents to the clinic with right leg worsening pain and swelling.   She has not been seen by PCP in over a year. Pt does not really want to come in but daughter appears to be making her today.   She continues to be very short of breath with exertion. Once she rest her breathing improves. She has not been able to lay flat at night in years. She never sleeps well.   Her right leg is more red and swollen in the last week. Her daughter is worried about the ongoing edema and discoloration around her right ankle. She does not use any compression. No fever, chills, body aches.   She does have PT come out twice a week to help her with ambulation.    .. Active Ambulatory Problems    Diagnosis Date Noted  . Dyslipidemia 09/27/2011  . Essential hypertension 09/27/2011  . CAD (coronary artery disease) 09/27/2011  . Permanent atrial fibrillation (HCC) 09/27/2011  . Sleep apnea 09/27/2011  . History of gout 09/27/2011  . Pacemaker 09/27/2011  . Rheumatoid arthritis(714.0) 09/27/2011  . Osteoporosis 09/27/2011  . Shingles outbreak 10/01/2011  . Nephrolithiasis - 0.3cm Right Ureter August 2013 10/07/2011  . History of (heterozygous) Hemochromatosis 10/10/2011  . Diverticulosis 11/04/2011  . Type 2 diabetes mellitus (HCC) 11/30/2012  . Hyperlipidemia 01/13/2013  . Neck mass 02/08/2013  . Vasculitis (HCC) 04/12/2014  . Cerebrovascular disease 08/24/2014  . Blood in stool 10/15/2015  . Encopresis 10/15/2015  . Incontinence 10/15/2015  . Acute on chronic diastolic heart failure (HCC) 10/03/2016  . Atrial fibrillation, chronic (HCC) 10/03/2016  . Chest tightness 10/03/2016  . HLD (hyperlipidemia) 10/03/2016  . History of cardiac pacemaker 2/2 CHB 10/03/2016  . Morbid obesity with BMI of 40.0-44.9,  adult (HCC) 10/03/2016  . Short-term memory loss 10/03/2016  . Pressure injury of skin 10/03/2016  . Bullous rash 08/28/2015  . Carpal tunnel syndrome, bilateral 12/09/2017  . Chronic anticoagulation 08/28/2015  . Gout 12/09/2017  . Rheumatoid arthritis (HCC) 12/09/2017  . Non compliance w medication regimen 12/09/2017  . Acute on chronic diastolic CHF (congestive heart failure) (HCC) 01/30/2018  . Venous insufficiency of both lower extremities 02/05/2018  . Bilateral lower extremity edema 02/05/2018  . Cellulitis 02/05/2018  . Physical deconditioning 02/05/2018  . Arm pain, diffuse, left, due to IV infiltrate.  05/22/2018  . Anticoagulation adequate 05/22/2018  . Sinus node dysfunction (HCC) 11/10/2018  . Cellulitis of right leg 12/22/2019  . Depressed mood 12/22/2019  . SOB (shortness of breath) on exertion 12/24/2019   Resolved Ambulatory Problems    Diagnosis Date Noted  . No Resolved Ambulatory Problems   Past Medical History:  Diagnosis Date  . Asthma   . Atrial fibrillation (HCC) 09/27/2011  . Diabetes mellitus without complication (HCC)   . History of vasculitis 09/27/2011  . Hypertension 09/27/2011  . PUD (peptic ulcer disease)       Review of Systems See HPI.     Objective:   Physical Exam Vitals reviewed.  Constitutional:      Appearance: Normal appearance. She is obese.  HENT:     Head: Normocephalic.  Cardiovascular:     Rate and Rhythm: Normal rate and regular rhythm.  Pulmonary:     Effort:  Pulmonary effort is normal.     Comments: SOB on any exertion.  Pulse ox went from 91 percent to 96 percent from exertion to rest.  Musculoskeletal:     Right lower leg: Edema present.     Left lower leg: Edema present.     Comments: Right leg much more swollen than left. 2+ edema with chronic swelling and discoloration around right lateral ankle. Tenderness and warmth to palpation. Sore on right anterior shin about 1cm by 1cm with no active drainage and scab  formation.   Neurological:     General: No focal deficit present.     Mental Status: She is alert and oriented to person, place, and time.  Psychiatric:     Comments: Depressed mood.            Assessment & Plan:  Marland KitchenMarland KitchenSofija was seen today for extremity laceration.  Diagnoses and all orders for this visit:  Cellulitis of right leg -     doxycycline (VIBRA-TABS) 100 MG tablet; Take 1 tablet (100 mg total) by mouth 2 (two) times daily.  Venous insufficiency of both lower extremities  Sinus node dysfunction (HCC)  Essential hypertension  Depressed mood  SOB (shortness of breath) on exertion   She has sore on anterior right leg with worsening redness, swelling edema. Treated with doxycycline.continue with lasix. Wrapped in [employer]. She needs close follow up with PCP. She needs to keep watch on chronic conditions and medications. Follow up Monday or Tuesday next week for recheck.  Reassurance that some of ongoing edema is chronic.   Not on appropriate therapy for CHF but look back at last echo and looks like her systolic function was good in 1027. Encouraged follow up with cardiology to manage. She was very SOB on exertion today but per patient that is her normal. I do think she could benefit from O2. Declined walking test today. She agrees to overnight pulse oximetry.   BP not to goal today. Will recheck at home. Follow up with PCP for management.   Pt is resistant to medications, testing. She does appear depressed and "she does not know why she is here". She declines medication. She is not suicidal but "ok with dying".   Long discussion how there could be multiple things to improve quality of life. Perhaps be open to one improvement at a time. She will consider.   Spent 40 minutes with patient discussing plan/intervention.

## 2019-12-24 DIAGNOSIS — R0602 Shortness of breath: Secondary | ICD-10-CM | POA: Insufficient documentation

## 2019-12-27 ENCOUNTER — Ambulatory Visit (INDEPENDENT_AMBULATORY_CARE_PROVIDER_SITE_OTHER): Payer: Medicare Other | Admitting: Osteopathic Medicine

## 2019-12-27 ENCOUNTER — Other Ambulatory Visit: Payer: Self-pay

## 2019-12-27 ENCOUNTER — Encounter: Payer: Self-pay | Admitting: Osteopathic Medicine

## 2019-12-27 DIAGNOSIS — Z9119 Patient's noncompliance with other medical treatment and regimen: Secondary | ICD-10-CM

## 2019-12-27 DIAGNOSIS — R0602 Shortness of breath: Secondary | ICD-10-CM

## 2019-12-27 DIAGNOSIS — I872 Venous insufficiency (chronic) (peripheral): Secondary | ICD-10-CM | POA: Diagnosis not present

## 2019-12-27 DIAGNOSIS — L03115 Cellulitis of right lower limb: Secondary | ICD-10-CM | POA: Diagnosis not present

## 2019-12-27 DIAGNOSIS — Z91199 Patient's noncompliance with other medical treatment and regimen due to unspecified reason: Secondary | ICD-10-CM

## 2019-12-27 NOTE — Progress Notes (Signed)
Virtual Visit via telephone   I connected with      Katherine Cervantes on 12/27/19 at 1:48 PM  by a telemedicine application and verified that I am speaking with the correct person using two identifiers.  Patient is at home I am in office   I discussed the limitations of evaluation and management by telemedicine and the availability of in person appointments. The patient expressed understanding and agreed to proceed.  History of Present Illness: Katherine Cervantes is a 86 y.o. female who would like to discuss f/u cellulitis, ill feeling   .  Follow-up cellulitis  o Seen by one of my partners 5 days ago 12/22/19, records reviewed. Pt brought in by her daughter. RLE pain.swelling. SOB on exertion stable, orthopnea stable. Dx RLE Cellulitis, venous insufficiency BL, DOE --> doxycycline, Unna boot. Encouraged f/u w/ cardiology. Pt declined walking test for home O2. Depression also a concern.  o Pt called to cancel today for "feeling ill" and not wanting to come in to the office. Visit was converted to a phone call  . Feeling ill today o Pt reports she didn't want to come to the office today, feeling very tired which she attributes to being on the antibiotics.  o Pt reports feeling about the same, Unna boot is off, daughter removed it this morning. RLE wound is sore/painful. Upper part of wound per pt reports is looking better but daughter is concerned about looking worse. Patient gave me verbal permission to contact her daughter Katherine Cervantes and confirmed her phone number.  o I called Katherine Cervantes 12/27/19 1:55 PM. She has photos of the skin, wound on the back doesn't seem to be healing. She reports Unna boot came off on Friday (3 days ago).          Observations/Objective: There were no vitals taken for this visit. BP Readings from Last 3 Encounters:  12/22/19 (!) 158/53  11/13/18 (!) 117/94  11/10/18 138/74   Exam: Normal Speech.    Lab and Radiology Results No results found for this or any  previous visit (from the past 72 hour(s)). No results found.     Assessment and Plan: 86 y.o. female with The primary encounter diagnosis was Cellulitis of right lower extremity. Diagnoses of Venous insufficiency of both lower extremities, SOB (shortness of breath) on exertion, and Nonadherence to medical treatment were also pertinent to this visit.  --> LABS --> home health --> convert to daily Rx (Eliquis --> Xarelto)  --> Lasix 80 mg AM and can repeat at lunchtime if still swelling --> daughter to set up proxy account MyChart or secure email me the photos    PDMP not reviewed this encounter. Orders Placed This Encounter  Procedures  . Ambulatory referral to Home Health    Referral Priority:   Routine    Referral Type:   Home Health Care    Referral Reason:   Specialty Services Required    Requested Specialty:   Home Health Services    Number of Visits Requested:   1   No orders of the defined types were placed in this encounter.     Follow Up Instructions: Return in about 1 week (around 01/03/2020) for IN-OFFICE VISIT RECHECK CELLULITIS .    I discussed the assessment and treatment plan with the patient. The patient was provided an opportunity to ask questions and all were answered. The patient agreed with the plan and demonstrated an understanding of the instructions.   The patient was  advised to call back or seek an in-person evaluation if any new concerns, if symptoms worsen or if the condition fails to improve as anticipated.  30 minutes of non-face-to-face time was provided during this encounter.      . . . . . . . . . . . . . Marland Kitchen                   Historical information moved to improve visibility of documentation.  Past Medical History:  Diagnosis Date  . Anticoagulation adequate 05/22/2018  . Asthma   . Atrial fibrillation (HCC) 09/27/2011  . CAD (coronary artery disease) 09/27/2011  . Diabetes mellitus without complication  (HCC)   . Diverticulosis 11/04/2011  . Dyslipidemia 09/27/2011  . History of vasculitis 09/27/2011  . Hypertension 09/27/2011  . Incontinence 10/15/2015  . Morbid obesity with BMI of 40.0-44.9, adult (HCC) 10/03/2016  . Osteoporosis 09/27/2011  . Pacemaker   . PUD (peptic ulcer disease)   . Rheumatoid arthritis(714.0) 09/27/2011  . Short-term memory loss 10/03/2016  . Sleep apnea 09/27/2011   Past Surgical History:  Procedure Laterality Date  . ABDOMINAL HYSTERECTOMY    . ANKLE SURGERY    . APPENDECTOMY    . Carpal tunnel    . CHOLECYSTECTOMY    . PARTIAL GASTRECTOMY    . PPM GENERATOR CHANGEOUT N/A 11/13/2018   Procedure: PPM GENERATOR CHANGEOUT;  Surgeon: Marinus Maw, MD;  Location[facility] INVASIVE CV LAB;  Service: Cardiovascular;  Laterality: N/A;   Social History   Tobacco Use  . Smoking status: Never Smoker  . Smokeless tobacco: Never Used  Substance Use Topics  . Alcohol use: Yes    Alcohol/week: 0.0 standard drinks    Comment: Occasional   family history includes Cancer in her brother and brother; Hypertension in her mother.  Medications: Current Outpatient Medications  Medication Sig Dispense Refill  . acetaminophen (TYLENOL) 325 MG tablet Take 2 tablets (650 mg total) by mouth every 4 (four) hours as needed for headache or mild pain.    Marland Kitchen amLODipine (NORVASC) 5 MG tablet Take 1 tablet (5 mg total) by mouth daily. 30 tablet 3  . apixaban (ELIQUIS) 5 MG TABS tablet Take 1 tablet (5 mg total) by mouth 2 (two) times daily. 60 tablet 6  . doxycycline (VIBRA-TABS) 100 MG tablet Take 1 tablet (100 mg total) by mouth 2 (two) times daily. 20 tablet 0  . furosemide (LASIX) 80 MG tablet Take 1 tablet (80 mg total) by mouth 2 (two) times daily. 180 tablet 3  . potassium chloride SA (K-DUR,KLOR-CON) 20 MEQ tablet Take 1 tablet (20 mEq total) by mouth daily. 30 tablet 6  . spironolactone (ALDACTONE) 25 MG tablet Take 1 tablet (25 mg total) by mouth daily. 30 tablet 6  . nitroGLYCERIN  (NITROSTAT) 0.4 MG SL tablet Place 1 tablet (0.4 mg total) under the tongue every 5 (five) minutes as needed for chest pain (MAX 3 TABLETS). (Patient not taking: Reported on 12/27/2019) 25 tablet 3   No current facility-administered medications for this visit.   Allergies  Allergen Reactions  . Aspirin Anaphylaxis  . Other Anaphylaxis  . Penicillins Anaphylaxis    Anaphylaxis  . Codeine Other (See Comments)    SOB Asthmatic issues  . Morphine And Related Other (See Comments)    Anaphylaxis  . Sulfa Antibiotics Rash  . Ether   . Levaquin [Levofloxacin In D5w] Other (See Comments)    Disoriented, Hallucinations  . Metoprolol  Possible  . Prednisone Other (See Comments)    Lethargic, rash  . Procaine

## 2019-12-31 DIAGNOSIS — L03115 Cellulitis of right lower limb: Secondary | ICD-10-CM | POA: Diagnosis not present

## 2019-12-31 DIAGNOSIS — R0602 Shortness of breath: Secondary | ICD-10-CM | POA: Diagnosis not present

## 2019-12-31 DIAGNOSIS — E785 Hyperlipidemia, unspecified: Secondary | ICD-10-CM | POA: Diagnosis not present

## 2019-12-31 DIAGNOSIS — Z9119 Patient's noncompliance with other medical treatment and regimen: Secondary | ICD-10-CM | POA: Diagnosis not present

## 2019-12-31 DIAGNOSIS — I872 Venous insufficiency (chronic) (peripheral): Secondary | ICD-10-CM | POA: Diagnosis not present

## 2019-12-31 DIAGNOSIS — E119 Type 2 diabetes mellitus without complications: Secondary | ICD-10-CM | POA: Diagnosis not present

## 2019-12-31 MED ORDER — RIVAROXABAN 20 MG PO TABS: 20.0000 mg | ORAL_TABLET | Freq: Every day | ORAL | 0 refills | Status: AC

## 2019-12-31 NOTE — Addendum Note (Signed)
Addended by: Deirdre Pippins on: 12/31/2019 01:26 PM   Modules accepted: Orders

## 2019-12-31 NOTE — Patient Instructions (Addendum)
-->   LABS --> home health --> convert to daily Rx (Eliquis --> Xarelto)  --> Lasix 80 mg AM and can repeat at lunchtime if still swelling --> daughter to set up proxy account MyChart or secure email me the photos

## 2019-12-31 NOTE — Progress Notes (Signed)
Addendum 12/31/19 1:26 PM  Patietn in office for lab orders, I looked at leg - no significant erythema, superficial ulceration posterior RLE, skin dusky but good cap refill. No active infection apparent. Home health still pending. Daughter informed of plan

## 2020-01-01 LAB — CBC
HCT: 48.4 % — ABNORMAL HIGH (ref 35.0–45.0)
Hemoglobin: 16.1 g/dL — ABNORMAL HIGH (ref 11.7–15.5)
MCH: 31 pg (ref 27.0–33.0)
MCHC: 33.3 g/dL (ref 32.0–36.0)
MCV: 93.3 fL (ref 80.0–100.0)
MPV: 11.7 fL (ref 7.5–12.5)
Platelets: 175 10*3/uL (ref 140–400)
RBC: 5.19 10*6/uL — ABNORMAL HIGH (ref 3.80–5.10)
RDW: 13 % (ref 11.0–15.0)
WBC: 5.7 10*3/uL (ref 3.8–10.8)

## 2020-01-01 LAB — COMPLETE METABOLIC PANEL WITH GFR
AG Ratio: 1 (calc) (ref 1.0–2.5)
ALT: 12 U/L (ref 6–29)
AST: 18 U/L (ref 10–35)
Albumin: 4.1 g/dL (ref 3.6–5.1)
Alkaline phosphatase (APISO): 78 U/L (ref 37–153)
BUN/Creatinine Ratio: 28 (calc) — ABNORMAL HIGH (ref 6–22)
BUN: 37 mg/dL — ABNORMAL HIGH (ref 7–25)
CO2: 27 mmol/L (ref 20–32)
Calcium: 9.7 mg/dL (ref 8.6–10.4)
Chloride: 100 mmol/L (ref 98–110)
Creat: 1.3 mg/dL — ABNORMAL HIGH (ref 0.60–0.88)
GFR, Est African American: 42 mL/min/{1.73_m2} — ABNORMAL LOW (ref >=60)
GFR, Est Non African American: 37 mL/min/{1.73_m2} — ABNORMAL LOW (ref >=60)
Globulin: 4 g/dL (calc) — ABNORMAL HIGH (ref 1.9–3.7)
Glucose, Bld: 101 mg/dL (ref 65–139)
Potassium: 3.9 mmol/L (ref 3.5–5.3)
Sodium: 139 mmol/L (ref 135–146)
Total Bilirubin: 0.7 mg/dL (ref 0.2–1.2)
Total Protein: 8.1 g/dL (ref 6.1–8.1)

## 2020-01-01 LAB — HEMOGLOBIN A1C
Hgb A1c MFr Bld: 6.5 % of total Hgb — ABNORMAL HIGH (ref <5.7)
Mean Plasma Glucose: 140 (calc)
eAG (mmol/L): 7.7 (calc)

## 2020-01-01 LAB — TSH: TSH: 3.65 mIU/L (ref 0.40–4.50)

## 2020-01-01 LAB — BRAIN NATRIURETIC PEPTIDE: Brain Natriuretic Peptide: 257 pg/mL — ABNORMAL HIGH (ref <100)

## 2020-01-03 ENCOUNTER — Encounter: Payer: Medicare Other | Admitting: Osteopathic Medicine

## 2020-01-05 ENCOUNTER — Other Ambulatory Visit: Payer: Self-pay | Admitting: Osteopathic Medicine

## 2020-02-01 ENCOUNTER — Ambulatory Visit (INDEPENDENT_AMBULATORY_CARE_PROVIDER_SITE_OTHER): Payer: Medicare Other | Admitting: Osteopathic Medicine

## 2020-02-01 DIAGNOSIS — Z0000 Encounter for general adult medical examination without abnormal findings: Secondary | ICD-10-CM | POA: Diagnosis not present

## 2020-02-01 NOTE — Patient Instructions (Signed)
Bone Density Test The bone density test uses a special type of X-ray to measure the amount of calcium and other minerals in your bones. It can measure bone density in the hip and the spine. The test procedure is similar to having a regular X-ray. This test may also be called:  Bone densitometry.  Bone mineral density test.  Dual-energy X-ray absorptiometry (DEXA). You may have this test to:  Diagnose a condition that causes weak or thin bones (osteoporosis).  Screen you for osteoporosis.  Predict your risk for a broken bone (fracture).  Determine how well your osteoporosis treatment is working. Tell a health care provider about:  Any allergies you have.  All medicines you are taking, including vitamins, herbs, eye drops, creams, and over-the-counter medicines.  Any problems you or family members have had with anesthetic medicines.  Any blood disorders you have.  Any surgeries you have had.  Any medical conditions you have.  Whether you are pregnant or may be pregnant.  Any medical tests you have had within the past 14 days that used contrast material. What are the risks? Generally, this is a safe procedure. However, it does expose you to a small amount of radiation, which can slightly increase your cancer risk. What happens before the procedure?  Do not take any calcium supplements starting 24 hours before your test.  Remove all metal jewelry, eyeglasses, dental appliances, and any other metal objects. What happens during the procedure?   You will lie down on an exam table. There will be an X-ray generator below you and an imaging device above you.  Other devices, such as boxes or braces, may be used to position your body properly for the scan.  The machine will slowly scan your body. You will need to keep still.  The images will show up on a screen in the room. Images will be examined by a specialist after your test is done. The procedure may vary among health  care providers and hospitals. What happens after the procedure?  It is up to you to get your test results. Ask your health care provider, or the department that is doing the test, when your results will be ready. Summary  A bone density test is an imaging test that uses a type of X-ray to measure the amount of calcium and other minerals in your bones.  The test may be used to diagnose or screen you for a condition that causes weak or thin bones (osteoporosis), predict your risk for a broken bone (fracture), or determine how well your osteoporosis treatment is working.  Do not take any calcium supplements starting 24 hours before your test.  Ask your health care provider, or the department that is doing the test, when your results will be ready. This information is not intended to replace advice given to you by your health care provider. Make sure you discuss any questions you have with your health care provider. Document Revised: 02/13/2017 Document Reviewed: 12/02/2016 Elsevier Patient Education  [place].   Colonoscopy, Adult A colonoscopy is a procedure to look at the entire large intestine. This procedure is done using a long, thin, flexible tube that has a camera on the end. You may have a colonoscopy:  As a part of normal colorectal screening.  If you have certain symptoms, such as: ? A low number of red blood cells in your blood (anemia). ? Diarrhea that does not go away. ? Pain in your abdomen. ? Blood in  your stool. A colonoscopy can help screen for and diagnose medical problems, including:  Tumors.  Extra tissue that grows where mucus forms (polyps).  Inflammation.  Areas of bleeding. Tell your health care provider about:  Any allergies you have.  All medicines you are taking, including vitamins, herbs, eye drops, creams, and over-the-counter medicines.  Any problems you or family members have had with anesthetic medicines.  Any blood disorders you  have.  Any surgeries you have had.  Any medical conditions you have.  Any problems you have had with having bowel movements.  Whether you are pregnant or may be pregnant. What are the risks? Generally, this is a safe procedure. However, problems may occur, including:  Bleeding.  Damage to your intestine.  Allergic reactions to medicines given during the procedure.  Infection. This is rare. What happens before the procedure? Eating and drinking restrictions Follow instructions from your health care provider about eating or drinking restrictions, which may include:  A few days before the procedure: ? Follow a low-fiber diet. ? Avoid nuts, seeds, dried fruit, raw fruits, and vegetables.  1-3 days before the procedure: ? Eat only gelatin dessert or ice pops. ? Drink only clear liquids, such as water, clear juice, clear broth or bouillon, black coffee or tea, or clear soft drinks or sports drinks. ? Avoid liquids that contain red or purple dye.  The day of the procedure: ? Do not eat solid foods. You may continue to drink clear liquids until up to 2 hours before the procedure. ? Do not eat or drink anything starting 2 hours before the procedure, or within the time period that your health care provider recommends. Bowel prep If you were prescribed a bowel prep to take by mouth (orally) to clean out your colon:  Take it as told by your health care provider. Starting the day before your procedure, you will need to drink a large amount of liquid medicine. The liquid will cause you to have many bowel movements of loose stool until your stool becomes almost clear or light green.  If your skin or the opening between the buttocks (anus) gets irritated from diarrhea, you may relieve the irritation using: ? Wipes with medicine in them, such as adult wet wipes with aloe and vitamin E. ? A product to soothe skin, such as petroleum jelly.  If you vomit while drinking the bowel prep: ? Take  a break for up to 60 minutes. ? Begin the bowel prep again. ? Call your health care provider if you keep vomiting or you cannot take the bowel prep without vomiting.  To clean out your colon, you may also be given: ? Laxative medicines. These help you have a bowel movement. ? Instructions for enema use. An enema is liquid medicine injected into your rectum. Medicines Ask your health care provider about:  Changing or stopping your regular medicines or supplements. This is especially important if you are taking iron supplements, diabetes medicines, or blood thinners.  Taking medicines such as aspirin and ibuprofen. These medicines can thin your blood. Do not take these medicines unless your health care provider tells you to take them.  Taking over-the-counter medicines, vitamins, herbs, and supplements. General instructions  Ask your health care provider what steps will be taken to help prevent infection. These may include washing skin with a germ-killing soap.  Plan to have someone take you home from the hospital or clinic. What happens during the procedure?   An IV will be inserted  into one of your veins.  You may be given one or more of the following: ? A medicine to help you relax (sedative). ? A medicine to numb the area (local anesthetic). ? A medicine to make you fall asleep (general anesthetic). This is rarely needed.  You will lie on your side with your knees bent.  The tube will: ? Have oil or gel put on it (be lubricated). ? Be inserted into your anus. ? Be gently eased through all parts of your large intestine.  Air will be sent into your colon to keep it open. This may cause some pressure or cramping.  Images will be taken with the camera and will appear on a screen.  A small tissue sample may be removed to be looked at under a microscope (biopsy). The tissue may be sent to a lab for testing if any signs of problems are found.  If small polyps are found, they may  be removed and checked for cancer cells.  When the procedure is finished, the tube will be removed. The procedure may vary among health care providers and hospitals. What happens after the procedure?  Your blood pressure, heart rate, breathing rate, and blood oxygen level will be monitored until you leave the hospital or clinic.  You may have a small amount of blood in your stool.  You may pass gas and have mild cramping or bloating in your abdomen. This is caused by the air that was used to open your colon during the exam.  Do not drive for 24 hours after the procedure.  It is up to you to get the results of your procedure. Ask your health care provider, or the department that is doing the procedure, when your results will be ready. Summary  A colonoscopy is a procedure to look at the entire large intestine.  Follow instructions from your health care provider about eating and drinking before the procedure.  If you were prescribed an oral bowel prep to clean out your colon, take it as told by your health care provider.  During the colonoscopy, a flexible tube with a camera on its end is inserted into the anus and then passed into the other parts of the large intestine. This information is not intended to replace advice given to you by your health care provider. Make sure you discuss any questions you have with your health care provider. Document Revised: 08/21/2018 Document Reviewed: 08/21/2018 Elsevier Patient Education  [place] Prevention in the Home, Adult Falls can cause injuries. They can happen to people of all ages. There are many things you can do to make your home safe and to help prevent falls. Ask for help when making these changes, if needed. What actions can I take to prevent falls? General Instructions  Use good lighting in all rooms. Replace any light bulbs that burn out.  Turn on the lights when you go into a dark area. Use night-lights.  Keep  items that you use often in easy-to-reach places. Lower the shelves around your home if necessary.  Set up your furniture so you have a clear path. Avoid moving your furniture around.  Do not have throw rugs and other things on the floor that can make you trip.  Avoid walking on wet floors.  If any of your floors are uneven, fix them.  Add color or contrast paint or tape to clearly mark and help you see: ? Any grab bars or handrails. ?  First and last steps of stairways. ? Where the edge of each step is.  If you use a stepladder: ? Make sure that it is fully opened. Do not climb a closed stepladder. ? Make sure that both sides of the stepladder are locked into place. ? Ask someone to hold the stepladder for you while you use it.  If there are any pets around you, be aware of where they are. What can I do in the bathroom?      Keep the floor dry. Clean up any water that spills onto the floor as soon as it happens.  Remove soap buildup in the tub or shower regularly.  Use non-skid mats or decals on the floor of the tub or shower.  Attach bath mats securely with double-sided, non-slip rug tape.  If you need to sit down in the shower, use a plastic, non-slip stool.  Install grab bars by the toilet and in the tub and shower. Do not use towel bars as grab bars. What can I do in the bedroom?  Make sure that you have a light by your bed that is easy to reach.  Do not use any sheets or blankets that are too big for your bed. They should not hang down onto the floor.  Have a firm chair that has side arms. You can use this for support while you get dressed. What can I do in the kitchen?  Clean up any spills right away.  If you need to reach something above you, use a strong step stool that has a grab bar.  Keep electrical cords out of the way.  Do not use floor polish or wax that makes floors slippery. If you must use wax, use non-skid floor wax. What can I do with my  stairs?  Do not leave any items on the stairs.  Make sure that you have a light switch at the top of the stairs and the bottom of the stairs. If you do not have them, ask someone to add them for you.  Make sure that there are handrails on both sides of the stairs, and use them. Fix handrails that are broken or loose. Make sure that handrails are as long as the stairways.  Install non-slip stair treads on all stairs in your home.  Avoid having throw rugs at the top or bottom of the stairs. If you do have throw rugs, attach them to the floor with carpet tape.  Choose a carpet that does not hide the edge of the steps on the stairway.  Check any carpeting to make sure that it is firmly attached to the stairs. Fix any carpet that is loose or worn. What can I do on the outside of my home?  Use bright outdoor lighting.  Regularly fix the edges of walkways and driveways and fix any cracks.  Remove anything that might make you trip as you walk through a door, such as a raised step or threshold.  Trim any bushes or trees on the path to your home.  Regularly check to see if handrails are loose or broken. Make sure that both sides of any steps have handrails.  Install guardrails along the edges of any raised decks and porches.  Clear walking paths of anything that might make someone trip, such as tools or rocks.  Have any leaves, snow, or ice cleared regularly.  Use sand or salt on walking paths during winter.  Clean up any spills in your garage right  away. This includes grease or oil spills. What other actions can I take?  Wear shoes that: ? Have a low heel. Do not wear high heels. ? Have rubber bottoms. ? Are comfortable and fit you well. ? Are closed at the toe. Do not wear open-toe sandals.  Use tools that help you move around (mobility aids) if they are needed. These include: ? Canes. ? Walkers. ? Scooters. ? Crutches.  Review your medicines with your doctor. Some medicines  can make you feel dizzy. This can increase your chance of falling. Ask your doctor what other things you can do to help prevent falls. Where to find more information  Centers for Disease Control and Prevention, [employer]: HealthcareCounselor.com.pt  [employer] on Aging: RingConnections.si Contact a doctor if:  You are afraid of falling at home.  You feel weak, drowsy, or dizzy at home.  You fall at home. Summary  There are many simple things that you can do to make your home safe and to help prevent falls.  Ways to make your home safe include removing tripping hazards and installing grab bars in the bathroom.  Ask for help when making these changes in your home. This information is not intended to replace advice given to you by your health care provider. Make sure you discuss any questions you have with your health care provider. Document Revised: 05/21/2018 Document Reviewed: 09/12/2016 Elsevier Patient Education  2020 [employer].   Health Maintenance, Female Adopting a healthy lifestyle and getting preventive care are important in promoting health and wellness. Ask your health care provider about:  The right schedule for you to have regular tests and exams.  Things you can do on your own to prevent diseases and keep yourself healthy. What should I know about diet, weight, and exercise? Eat a healthy diet   Eat a diet that includes plenty of vegetables, fruits, low-fat dairy products, and lean protein.  Do not eat a lot of foods that are high in solid fats, added sugars, or sodium. Maintain a healthy weight Body mass index (BMI) is used to identify weight problems. It estimates body fat based on height and weight. Your health care provider can help determine your BMI and help you achieve or maintain a healthy weight. Get regular exercise Get regular exercise. This is one of the most important things you can do for your health. Most adults should:  Exercise for at  least 150 minutes each week. The exercise should increase your heart rate and make you sweat (moderate-intensity exercise).  Do strengthening exercises at least twice a week. This is in addition to the moderate-intensity exercise.  Spend less time sitting. Even light physical activity can be beneficial. Watch cholesterol and blood lipids Have your blood tested for lipids and cholesterol at 84 years of age, then have this test every 5 years. Have your cholesterol levels checked more often if:  Your lipid or cholesterol levels are high.  You are older than 84 years of age.  You are at high risk for heart disease. What should I know about cancer screening? Depending on your health history and family history, you may need to have cancer screening at various ages. This may include screening for:  Breast cancer.  Cervical cancer.  Colorectal cancer.  Skin cancer.  Lung cancer. What should I know about heart disease, diabetes, and high blood pressure? Blood pressure and heart disease  High blood pressure causes heart disease and increases the risk of stroke. This is more  likely to develop in people who have high blood pressure readings, are of African descent, or are overweight.  Have your blood pressure checked: ? Every 3-5 years if you are 49-32 years of age. ? Every year if you are 74 years old or older. Diabetes Have regular diabetes screenings. This checks your fasting blood sugar level. Have the screening done:  Once every three years after age 23 if you are at a normal weight and have a low risk for diabetes.  More often and at a younger age if you are overweight or have a high risk for diabetes. What should I know about preventing infection? Hepatitis B If you have a higher risk for hepatitis B, you should be screened for this virus. Talk with your health care provider to find out if you are at risk for hepatitis B infection. Hepatitis C Testing is recommended  for:  Everyone born from [birth year] through 1963.  Anyone with known risk factors for hepatitis C. Sexually transmitted infections (STIs)  Get screened for STIs, including gonorrhea and chlamydia, if: ? You are sexually active and are younger than 84 years of age. ? You are older than 84 years of age and your health care provider tells you that you are at risk for this type of infection. ? Your sexual activity has changed since you were last screened, and you are at increased risk for chlamydia or gonorrhea. Ask your health care provider if you are at risk.  Ask your health care provider about whether you are at high risk for HIV. Your health care provider may recommend a prescription medicine to help prevent HIV infection. If you choose to take medicine to prevent HIV, you should first get tested for HIV. You should then be tested every 3 months for as long as you are taking the medicine. Pregnancy  If you are about to stop having your period (premenopausal) and you may become pregnant, seek counseling before you get pregnant.  Take 400 to 800 micrograms (mcg) of folic acid every day if you become pregnant.  Ask for birth control (contraception) if you want to prevent pregnancy. Osteoporosis and menopause Osteoporosis is a disease in which the bones lose minerals and strength with aging. This can result in bone fractures. If you are 44 years old or older, or if you are at risk for osteoporosis and fractures, ask your health care provider if you should:  Be screened for bone loss.  Take a calcium or vitamin D supplement to lower your risk of fractures.  Be given hormone replacement therapy (HRT) to treat symptoms of menopause. Follow these instructions at home: Lifestyle  Do not use any products that contain nicotine or tobacco, such as cigarettes, e-cigarettes, and chewing tobacco. If you need help quitting, ask your health care provider.  Do not use street drugs.  Do not share  needles.  Ask your health care provider for help if you need support or information about quitting drugs. Alcohol use  Do not drink alcohol if: ? Your health care provider tells you not to drink. ? You are pregnant, may be pregnant, or are planning to become pregnant.  If you drink alcohol: ? Limit how much you use to 0-1 drink a day. ? Limit intake if you are breastfeeding.  Be aware of how much alcohol is in your drink. In the [place] one drink equals one 12 oz bottle of beer (355 mL), one 5 oz glass of wine (148 mL), or one 1  oz glass of hard liquor (44 mL). General instructions  Schedule regular health, dental, and eye exams.  Stay current with your vaccines.  Tell your health care provider if: ? You often feel depressed. ? You have ever been abused or do not feel safe at home. Summary  Adopting a healthy lifestyle and getting preventive care are important in promoting health and wellness.  Follow your health care provider's instructions about healthy diet, exercising, and getting tested or screened for diseases.  Follow your health care provider's instructions on monitoring your cholesterol and blood pressure. This information is not intended to replace advice given to you by your health care provider. Make sure you discuss any questions you have with your health care provider. Document Revised: 01/21/2018 Document Reviewed: 01/21/2018 Elsevier Patient Education  2020 [employer].

## 2020-02-01 NOTE — Progress Notes (Signed)
MEDICARE ANNUAL WELLNESS VISIT  02/01/2020  Telephone Visit Disclaimer This Medicare AWV was conducted by telephone due to national recommendations for restrictions regarding the COVID-19 Pandemic (e.g. social distancing).  I verified, using two identifiers, that I am speaking with Katherine Cervantes or their authorized healthcare agent. I discussed the limitations, risks, security, and privacy concerns of performing an evaluation and management service by telephone and the potential availability of an in-person appointment in the future. The patient expressed understanding and agreed to proceed.  Location of Patient: Home Location of Provider (nurse):  In the office  Subjective:    Katherine Cervantes is a 86 y.o. female patient of Sunnie Nielsen, DO who had a Medicare Annual Wellness Visit today via telephone. Tenee is Retired and lives alone. she has 7 children. she reports that she is socially active and does interact with friends/family regularly. she is minimally physically active and enjoys reading.  Patient Care Team: Sunnie Nielsen, DO as PCP - General (Osteopathic Medicine) Lewayne Bunting, MD as PCP - Cardiology (Cardiology) Ernestene Kiel MD as Attending Physician (Cardiology)  Advanced Directives 02/01/2020 11/13/2018 05/17/2018 05/13/2018 05/13/2018 05/13/2018 01/30/2018  Does Patient Have a Medical Advance Directive? Yes Yes - - Yes Yes Yes  Type of Advance Directive Living will Healthcare Power of [place];Living will Healthcare Power of Attorney - (No Data) - Living will  Does patient want to make changes to medical advance directive? Yes (MAU/Ambulatory/Procedural Areas - Information given) No - Patient declined No - Patient declined No - Patient declined - No - Patient declined No - Patient declined  Copy of Healthcare Power of Attorney in Chart? Yes - validated most recent copy scanned in chart (See row information) No - copy requested - - - [facility] Utilization Over  the Past 12 Months: # of hospitalizations or ER visits: 0 # of surgeries: 0  Review of Systems    Patient reports that her overall health is better compared to last year.  History obtained from chart review and the patient  Patient Reported Readings (BP, Pulse, CBG, Weight, etc) none  Pain Assessment Pain : 0-10 Pain Score: 2  Pain Type: Chronic pain Pain Location: Hand Pain Orientation: Left,Right Pain Descriptors / Indicators: Aching Pain Onset: More than a month ago Pain Frequency: Intermittent Effect of Pain on Daily Activities: none     Current Medications & Allergies (verified) Allergies as of 02/01/2020      Reactions   Aspirin Anaphylaxis   Other Anaphylaxis   Penicillins Anaphylaxis   Anaphylaxis   Codeine Other (See Comments)   SOB Asthmatic issues   Morphine And Related Other (See Comments)   Anaphylaxis   Sulfa Antibiotics Rash   Ether    Levaquin [levofloxacin In D5w] Other (See Comments)   Disoriented, Hallucinations   Metoprolol    Possible   Prednisone Other (See Comments)   Lethargic, rash   Procaine       Medication List       Accurate as of February 01, 2020  9:39 AM. If you have any questions, ask your nurse or doctor.        acetaminophen 325 MG tablet Commonly known as: TYLENOL Take 2 tablets (650 mg total) by mouth every 4 (four) hours as needed for headache or mild pain.   amLODipine 5 MG tablet Commonly known as: NORVASC Take 1 tablet (5 mg total) by mouth daily.   furosemide 80 MG tablet Commonly known as: LASIX  Take 1 tablet (80 mg total) by mouth 2 (two) times daily.   nitroGLYCERIN 0.4 MG SL tablet Commonly known as: NITROSTAT Place 1 tablet (0.4 mg total) under the tongue every 5 (five) minutes as needed for chest pain (MAX 3 TABLETS).   potassium chloride SA 20 MEQ tablet Commonly known as: KLOR-CON Take 1 tablet (20 mEq total) by mouth daily.   rivaroxaban 20 MG Tabs tablet Commonly known as: Xarelto Take 1  tablet (20 mg total) by mouth daily.   spironolactone 25 MG tablet Commonly known as: ALDACTONE Take 1 tablet (25 mg total) by mouth daily.       History (reviewed): Past Medical History:  Diagnosis Date  . Anticoagulation adequate 05/22/2018  . Asthma   . Atrial fibrillation (HCC) 09/27/2011  . CAD (coronary artery disease) 09/27/2011  . Diabetes mellitus without complication (HCC)   . Diverticulosis 11/04/2011  . Dyslipidemia 09/27/2011  . History of vasculitis 09/27/2011  . Hypertension 09/27/2011  . Incontinence 10/15/2015  . Morbid obesity with BMI of 40.0-44.9, adult (HCC) 10/03/2016  . Osteoporosis 09/27/2011  . Pacemaker   . PUD (peptic ulcer disease)   . Rheumatoid arthritis(714.0) 09/27/2011  . Short-term memory loss 10/03/2016  . Sleep apnea 09/27/2011   Past Surgical History:  Procedure Laterality Date  . ABDOMINAL HYSTERECTOMY    . ANKLE SURGERY    . APPENDECTOMY    . Carpal tunnel    . CHOLECYSTECTOMY    . PARTIAL GASTRECTOMY    . PPM GENERATOR CHANGEOUT N/A 11/13/2018   Procedure: PPM GENERATOR CHANGEOUT;  Surgeon: Marinus Maw, MD;  [facility] INVASIVE CV LAB;  Service: Cardiovascular;  Laterality: N/A;  . REPLACEMENT TOTAL KNEE Bilateral    Family History  Problem Relation Age of Onset  . Hypertension Mother   . Cancer Brother        colon  . Dementia Brother   . Cancer Daughter        colon,breast   Social History   Socioeconomic History  . Marital status: Widowed    Spouse name: Not on file  . Number of children: 7  . Years of education: some college  . Highest education level: Some college, no degree  Occupational History  . Occupation: retired    Comment: social services  Tobacco Use  . Smoking status: Former Smoker    Types: Cigarettes  . Smokeless tobacco: Never Used  . Tobacco comment: quit 4 months ago  Vaping Use  . Vaping Use: Never used  Substance and Sexual Activity  . Alcohol use: Yes    Alcohol/week: 0.0 standard drinks     Comment: Occasional  . Drug use: No  . Sexual activity: Not Currently  Other Topics Concern  . Not on file  Social History Narrative  . Not on file   Social Determinants of Health   Financial Resource Strain: Low Risk   . Difficulty of Paying Living Expenses: Not hard at all  Food Insecurity: No Food Insecurity  . Worried About Programme researcher, broadcasting/film/video in the Last Year: Never true  . Ran Out of Food in the Last Year: Never true  Transportation Needs: No Transportation Needs  . Lack of Transportation (Medical): No  . Lack of Transportation (Non-Medical): No  Physical Activity: Insufficiently Active  . Days of Exercise per Week: 3 days  . Minutes of Exercise per Session: 20 min  Stress: No Stress Concern Present  . Feeling of Stress : Not at all  Social  Connections: Moderately Isolated  . Frequency of Communication with Friends and Family: More than three times a week  . Frequency of Social Gatherings with Friends and Family: Once a week  . Attends Religious Services: Never  . Active Member of Clubs or Organizations: Yes  . Attends Banker Meetings: Never  . Marital Status: Widowed    Activities of Daily Living In your present state of health, do you have any difficulty performing the following activities: 02/01/2020  Hearing? N  Vision? N  Difficulty concentrating or making decisions? N  Walking or climbing stairs? Y  Comment uses a walker  Dressing or bathing? N  Doing errands, shopping? Y  Comment doesnt drive anymore  Preparing Food and eating ? Y  Using the Toilet? N  In the past six months, have you accidently leaked urine? Y  Do you have problems with loss of bowel control? N  Managing your Medications? N  Managing your Finances? N  Housekeeping or managing your Housekeeping? N  Some recent data might be hidden    Patient Education/ Literacy How often do you need to have someone help you when you read instructions, pamphlets, or other written  materials from your doctor or pharmacy?: 2 - Rarely What is the last grade level you completed in school?: Some College  Exercise Current Exercise Habits: Home exercise routine, Type of exercise: walking, Time (Minutes): 25, Frequency (Times/Week): 7, Weekly Exercise (Minutes/Week): 175, Intensity: Mild, Exercise limited by: orthopedic condition(s) (arthiritis)  Diet Patient reports consuming 2 meals a day and 2 snack(s) a day Patient reports that her primary diet is: Regular Patient reports that she does have regular access to food.   Depression Screen PHQ 2/9 Scores 02/01/2020 12/30/2018 12/06/2016 03/19/2013  PHQ - 2 Score 0 1 2 2   PHQ- 9 Score - - 3 -     Fall Risk Fall Risk  02/01/2020 12/06/2016 03/19/2013  Falls in the past year? 0 No Yes  Number falls in past yr: 0 - -  Injury with Fall? 0 - -  Risk for fall due to : Impaired mobility History of fall(s);Impaired mobility -  Risk for fall due to: Comment uses walker/cane - -  Follow up Falls evaluation completed - -     Objective:  05/17/2013 seemed alert and oriented and she participated appropriately during our telephone visit.  Blood Pressure Weight BMI  BP Readings from Last 3 Encounters:  12/22/19 (!) 158/53  11/13/18 (!) 117/94  11/10/18 138/74   Wt Readings from Last 3 Encounters:  12/30/18 203 lb (92.1 kg)  11/10/18 216 lb (98 kg)  06/19/18 213 lb (96.6 kg)   BMI Readings from Last 1 Encounters:  12/22/19 39.65 kg/m    *Unable to obtain current vital signs, weight, and BMI due to telephone visit type  Hearing/Vision  . Andraya did not seem to have difficulty with hearing/understanding during the telephone conversation . Reports that she has not had a formal eye exam by an eye care professional within the past year . Reports that she has not had a formal hearing evaluation within the past year *Unable to fully assess hearing and vision during telephone visit type  Cognitive Function: 6CIT Screen  02/01/2020  What Year? 0 points  What month? 0 points  What time? 0 points  Count back from 20 2 points  Months in reverse 0 points  Repeat phrase 0 points  Total Score 2   (Normal:0-7, Significant for Dysfunction: >8)  Normal Cognitive Function Screening: Yes   Immunization & Health Maintenance Record There is no immunization history for the selected administration types on file for this patient.  Health Maintenance  Topic Date Due  . DEXA SCAN  03/12/2020 (Originally 03/10/1996)  . URINE MICROALBUMIN  03/13/2020 (Originally 04/19/2016)  . INFLUENZA VACCINE  05/11/2020 (Originally 09/12/2019)  . COVID-19 Vaccine (1) 07/17/2020 (Originally 03/11/1941)  . FOOT EXAM  09/10/2020 (Originally 08/24/2015)  . OPHTHALMOLOGY EXAM  11/10/2020 (Originally [birth year]10[birth year]1941)  . TETANUS/TDAP  12/26/2020 (Originally [birth year]10/1950)  . PNA vac Low Risk Adult (1 of 2 - PCV13) 10/12/2025 (Originally 03/10/1996)  . HEMOGLOBIN A1C  06/29/2020       Assessment  This is a routine wellness examination for Katherine Cervantes.  Health Maintenance: Due or Overdue There are no preventive care reminders to display for this patient.  Katherine Cervantes does not need a referral for Community Assistance: Care Management:   no Social Work:    no Prescription Assistance:  no Nutrition/Diabetes Education:  no   Plan:  Personalized Goals Goals Addressed   None    Personalized Health Maintenance & Screening Recommendations  Pneumococcal vaccine Influenza vaccine and Covid vaccine but patient stated that due to her allergies, she does not want to take the vaccines. She has taken the shingles vaccine two years ago but doesn't remember the date or location.  Lung Cancer Screening Recommended: not applicable (Low Dose CT Chest recommended if Age 52-80 years, 30 pack-year currently smoking OR have quit w/in past 15 years) Hepatitis C Screening recommended: yes HIV Screening recommended: yes  Advanced Directives: Written  information was not prepared per patient's request.  Referrals & Orders Orders Placed This Encounter  Procedures  . Flu Vaccine QUAD High Dose 65+ yrs IM (Fluad)  . Pneumococcal 23  . PCV 13 (Prevnar)  . Tdap vaccine greater than or equal to 7yo IM    Follow-up Plan . Follow-up with Sunnie Nielsen, DO as planned . Schedule your next medicare wellness exam in one year.  .    I have personally reviewed and noted the following in the patient's chart:   . Medical and social history . Use of alcohol, tobacco or illicit drugs  . Current medications and supplements . Functional ability and status . Nutritional status . Physical activity . Advanced directives . List of other physicians . Hospitalizations, surgeries, and ER visits in previous 12 months . Vitals . Screenings to include cognitive, depression, and falls . Referrals and appointments  In addition, I have reviewed and discussed with Katherine Cervantes certain preventive protocols, quality metrics, and best practice recommendations. A written personalized care plan for preventive services as well as general preventive health recommendations is available and can be mailed to the patient at her request.      Modesto Charon  02/01/2020    Review of Systems     Cardiac Risk Factors include: advanced age (>56men, >72 women);hypertension;obesity (BMI >30kg/m2)     Objective:    Today's Vitals   02/01/20 0849  PainSc: 2    There is no height or weight on file to calculate BMI.  Advanced Directives 02/01/2020 11/13/2018 05/17/2018 05/13/2018 05/13/2018 05/13/2018 01/30/2018  Does Patient Have a Medical Advance Directive? Yes Yes - - Yes Yes Yes  Type of Advance Directive Living will Healthcare Power of [place];Living will Healthcare Power of Attorney - (No Data) - Living will  Does patient want to make changes to medical advance directive? Yes (MAU/Ambulatory/Procedural Areas -  Information given) No - Patient declined No -  Patient declined No - Patient declined - No - Patient declined No - Patient declined  Copy of Healthcare Power of Attorney in Chart? Yes - validated most recent copy scanned in chart (See row information) No - copy requested - - - - -    Current Medications (verified) Outpatient Encounter Medications as of 02/01/2020  Medication Sig  . acetaminophen (TYLENOL) 325 MG tablet Take 2 tablets (650 mg total) by mouth every 4 (four) hours as needed for headache or mild pain.  Marland Kitchen amLODipine (NORVASC) 5 MG tablet Take 1 tablet (5 mg total) by mouth daily.  . furosemide (LASIX) 80 MG tablet Take 1 tablet (80 mg total) by mouth 2 (two) times daily.  . potassium chloride SA (K-DUR,KLOR-CON) 20 MEQ tablet Take 1 tablet (20 mEq total) by mouth daily.  . rivaroxaban (XARELTO) 20 MG TABS tablet Take 1 tablet (20 mg total) by mouth daily.  Marland Kitchen spironolactone (ALDACTONE) 25 MG tablet Take 1 tablet (25 mg total) by mouth daily.  . nitroGLYCERIN (NITROSTAT) 0.4 MG SL tablet Place 1 tablet (0.4 mg total) under the tongue every 5 (five) minutes as needed for chest pain (MAX 3 TABLETS). (Patient not taking: No sig reported)   No facility-administered encounter medications on file as of 02/01/2020.    Allergies (verified) Aspirin, Other, Penicillins, Codeine, Morphine and related, Sulfa antibiotics, Ether, Levaquin [levofloxacin in d5w], Metoprolol, Prednisone, and Procaine   History: Past Medical History:  Diagnosis Date  . Anticoagulation adequate 05/22/2018  . Asthma   . Atrial fibrillation (HCC) 09/27/2011  . CAD (coronary artery disease) 09/27/2011  . Diabetes mellitus without complication (HCC)   . Diverticulosis 11/04/2011  . Dyslipidemia 09/27/2011  . History of vasculitis 09/27/2011  . Hypertension 09/27/2011  . Incontinence 10/15/2015  . Morbid obesity with BMI of 40.0-44.9, adult (HCC) 10/03/2016  . Osteoporosis 09/27/2011  . Pacemaker   . PUD (peptic ulcer disease)   . Rheumatoid arthritis(714.0)  09/27/2011  . Short-term memory loss 10/03/2016  . Sleep apnea 09/27/2011   Past Surgical History:  Procedure Laterality Date  . ABDOMINAL HYSTERECTOMY    . ANKLE SURGERY    . APPENDECTOMY    . Carpal tunnel    . CHOLECYSTECTOMY    . PARTIAL GASTRECTOMY    . PPM GENERATOR CHANGEOUT N/A 11/13/2018   Procedure: PPM GENERATOR CHANGEOUT;  Surgeon: Marinus Maw, MD;  Location: [facility] INVASIVE CV LAB;  Service: Cardiovascular;  Laterality: N/A;  . REPLACEMENT TOTAL KNEE Bilateral    Family History  Problem Relation Age of Onset  . Hypertension Mother   . Cancer Brother        colon  . Dementia Brother   . Cancer Daughter        colon,breast   Social History   Socioeconomic History  . Marital status: Widowed    Spouse name: Not on file  . Number of children: 7  . Years of education: some college  . Highest education level: Some college, no degree  Occupational History  . Occupation: retired    Comment: social services  Tobacco Use  . Smoking status: Former Smoker    Types: Cigarettes  . Smokeless tobacco: Never Used  . Tobacco comment: quit 4 months ago  Vaping Use  . Vaping Use: Never used  Substance and Sexual Activity  . Alcohol use: Yes    Alcohol/week: 0.0 standard drinks    Comment: Occasional  . Drug use: No  .  Sexual activity: Not Currently  Other Topics Concern  . Not on file  Social History Narrative  . Not on file   Social Determinants of Health   Financial Resource Strain: Low Risk   . Difficulty of Paying Living Expenses: Not hard at all  Food Insecurity: No Food Insecurity  . Worried About Programme researcher, broadcasting/film/video in the Last Year: Never true  . Ran Out of Food in the Last Year: Never true  Transportation Needs: No Transportation Needs  . Lack of Transportation (Medical): No  . Lack of Transportation (Non-Medical): No  Physical Activity: Insufficiently Active  . Days of Exercise per Week: 3 days  . Minutes of Exercise per Session: 20 min  Stress: No  Stress Concern Present  . Feeling of Stress : Not at all  Social Connections: Moderately Isolated  . Frequency of Communication with Friends and Family: More than three times a week  . Frequency of Social Gatherings with Friends and Family: Once a week  . Attends Religious Services: Never  . Active Member of Clubs or Organizations: Yes  . Attends Banker Meetings: Never  . Marital Status: Widowed    Tobacco Counseling Counseling given: Not Answered Comment: quit 4 months ago   Clinical Intake:  Pre-visit preparation completed: Yes  Pain : 0-10 Pain Score: 2  Pain Type: Chronic pain Pain Location: Hand Pain Orientation: Left,Right Pain Descriptors / Indicators: Aching Pain Onset: More than a month ago Pain Frequency: Intermittent Effect of Pain on Daily Activities: none     BMI - recorded: 39.65 Nutritional Status: BMI > 30  Obese Nutritional Risks: None Diabetes: Yes CBG done?: No Did pt. bring in CBG monitor from home?: No  How often do you need to have someone help you when you read instructions, pamphlets, or other written materials from your doctor or pharmacy?: 2 - Rarely What is the last grade level you completed in school?: Some College  Diabetic? yes  Interpreter Needed?: No  Information entered by :: Modesto Charon, RN   Activities of Daily Living In your present state of health, do you have any difficulty performing the following activities: 02/01/2020  Hearing? N  Vision? N  Difficulty concentrating or making decisions? N  Walking or climbing stairs? Y  Comment uses a walker  Dressing or bathing? N  Doing errands, shopping? Y  Comment doesnt drive anymore  Preparing Food and eating ? Y  Using the Toilet? N  In the past six months, have you accidently leaked urine? Y  Do you have problems with loss of bowel control? N  Managing your Medications? N  Managing your Finances? N  Housekeeping or managing your Housekeeping? N  Some  recent data might be hidden    Patient Care Team: Sunnie Nielsen, DO as PCP - General (Osteopathic Medicine) [name] Madolyn Frieze, MD as PCP - Cardiology (Cardiology) Ernestene Kiel MD as Attending Physician (Cardiology)  Indicate any recent Medical Services you may have received from other than [facility] providers in the past year (date may be approximate).     Assessment:   This is a routine wellness examination for [facility].  Hearing/Vision screen No exam data present  Dietary issues and exercise activities discussed: Current Exercise Habits: Home exercise routine, Type of exercise: walking, Time (Minutes): 25, Frequency (Times/Week): 7, Weekly Exercise (Minutes/Week): 175, Intensity: Mild, Exercise limited by: orthopedic condition(s) (arthiritis)  Goals   None    Depression Screen [facility] 2/9 Scores 02/01/2020 12/30/2018 12/06/2016 03/19/2013  PHQ -  2 Score 0 1 2 2   PHQ- 9 Score - - 3 -    Fall Risk Fall Risk  02/01/2020 12/06/2016 03/19/2013  Falls in the past year? 0 No Yes  Number falls in past yr: 0 - -  Injury with Fall? 0 - -  Risk for fall due to : Impaired mobility History of fall(s);Impaired mobility -  Risk for fall due to: Comment uses walker/cane - -  Follow up Falls evaluation completed - -    FALL RISK PREVENTION PERTAINING TO THE HOME:  Any stairs in or around the home? No  If so, are there any without handrails? No  Home free of loose throw rugs in walkways, pet beds, electrical cords, etc? Yes  Adequate lighting in your home to reduce risk of falls? Yes   Cognitive Function:     6CIT Screen 02/01/2020  What Year? 0 points  What month? 0 points  What time? 0 points  Count back from 20 2 points  Months in reverse 0 points  Repeat phrase 0 points  Total Score 2    Immunizations There is no immunization history for the selected administration types on file for this patient.  TDAP status: Due, Education has been provided regarding the importance of this  vaccine. Advised may receive this vaccine at local pharmacy or Health Dept. Aware to provide a copy of the vaccination record if obtained from local pharmacy or Health Dept. Verbalized acceptance and understanding.  Flu Vaccine status: Due, Education has been provided regarding the importance of this vaccine. Advised may receive this vaccine at local pharmacy or Health Dept. Aware to provide a copy of the vaccination record if obtained from local pharmacy or Health Dept. Verbalized acceptance and understanding.  Pneumococcal vaccine status: Due, Education has been provided regarding the importance of this vaccine. Advised may receive this vaccine at local pharmacy or Health Dept. Aware to provide a copy of the vaccination record if obtained from local pharmacy or Health Dept. Verbalized acceptance and understanding.  Covid-19 vaccine status: Declined, Education has been provided regarding the importance of this vaccine but patient still declined. Advised may receive this vaccine at local pharmacy or Health Dept.or vaccine clinic. Aware to provide a copy of the vaccination record if obtained from local pharmacy or Health Dept. Verbalized acceptance and understanding.  Qualifies for Shingles Vaccine? Yes   Zostavax completed No   Shingrix Completed?: No.    Education has been provided regarding the importance of this vaccine. Patient has been advised to call insurance company to determine out of pocket expense if they have not yet received this vaccine. Advised may also receive vaccine at local pharmacy or Health Dept. Verbalized acceptance and understanding. Patient states that she has had the shingles vaccine two years ago but doesn't remember the date or location. Patient will bring records at the next visit.   Screening Tests Health Maintenance  Topic Date Due  . DEXA SCAN  03/12/2020 (Originally 03/10/1996)  . URINE MICROALBUMIN  03/13/2020 (Originally 04/19/2016)  . INFLUENZA VACCINE  05/11/2020  (Originally 09/12/2019)  . COVID-19 Vaccine (1) 07/17/2020 (Originally 03/11/1941)  . FOOT EXAM  09/10/2020 (Originally 08/24/2015)  . OPHTHALMOLOGY EXAM  11/10/2020 (Originally [birth year]10/1941)  . TETANUS/TDAP  12/26/2020 (Originally 03/10/1950)  . PNA vac Low Risk Adult (1 of 2 - PCV13) 10/12/2025 (Originally 03/10/1996)  . HEMOGLOBIN A1C  06/29/2020    Health Maintenance  There are no preventive care reminders to display for this patient.  Colorectal cancer screening:  No longer required.   Mammogram status: No longer required due to age.   Vision Screening: Recommended annual ophthalmology exams for early detection of glaucoma and other disorders of the eye. Is the patient up to date with their annual eye exam?  No   Dental Screening: Recommended annual dental exams for proper oral hygiene  Community Resource Referral / Chronic Care Management: CRR required this visit?  No   CCM required this visit?  No      Plan:     I have personally reviewed and noted the following in the patient's chart:   . Medical and social history . Use of alcohol, tobacco or illicit drugs  . Current medications and supplements . Functional ability and status . Nutritional status . Physical activity . Advanced directives . List of other physicians . Hospitalizations, surgeries, and ER visits in previous 12 months . Vitals . Screenings to include cognitive, depression, and falls . Referrals and appointments  In addition, I have reviewed and discussed with patient certain preventive protocols, quality metrics, and best practice recommendations. A written personalized care plan for preventive services as well as general preventive health recommendations were provided to patient.     Modesto Charon, RN   02/01/2020

## 2020-02-08 DIAGNOSIS — R6889 Other general symptoms and signs: Secondary | ICD-10-CM | POA: Diagnosis not present

## 2020-02-08 DIAGNOSIS — M069 Rheumatoid arthritis, unspecified: Secondary | ICD-10-CM | POA: Diagnosis not present

## 2020-02-08 DIAGNOSIS — I872 Venous insufficiency (chronic) (peripheral): Secondary | ICD-10-CM | POA: Diagnosis not present

## 2020-02-08 DIAGNOSIS — R0602 Shortness of breath: Secondary | ICD-10-CM | POA: Diagnosis not present

## 2020-02-08 DIAGNOSIS — I2510 Atherosclerotic heart disease of native coronary artery without angina pectoris: Secondary | ICD-10-CM | POA: Diagnosis not present

## 2020-02-08 DIAGNOSIS — Z79899 Other long term (current) drug therapy: Secondary | ICD-10-CM | POA: Diagnosis not present

## 2020-02-08 DIAGNOSIS — Z886 Allergy status to analgesic agent status: Secondary | ICD-10-CM | POA: Diagnosis not present

## 2020-02-08 DIAGNOSIS — Z7901 Long term (current) use of anticoagulants: Secondary | ICD-10-CM | POA: Diagnosis not present

## 2020-02-08 DIAGNOSIS — I440 Atrioventricular block, first degree: Secondary | ICD-10-CM | POA: Diagnosis not present

## 2020-02-08 DIAGNOSIS — I10 Essential (primary) hypertension: Secondary | ICD-10-CM | POA: Diagnosis not present

## 2020-02-08 DIAGNOSIS — R0689 Other abnormalities of breathing: Secondary | ICD-10-CM | POA: Diagnosis not present

## 2020-02-08 DIAGNOSIS — I8312 Varicose veins of left lower extremity with inflammation: Secondary | ICD-10-CM | POA: Diagnosis not present

## 2020-02-08 DIAGNOSIS — I8311 Varicose veins of right lower extremity with inflammation: Secondary | ICD-10-CM | POA: Diagnosis not present

## 2020-02-08 DIAGNOSIS — R918 Other nonspecific abnormal finding of lung field: Secondary | ICD-10-CM | POA: Diagnosis not present

## 2020-02-08 DIAGNOSIS — L308 Other specified dermatitis: Secondary | ICD-10-CM | POA: Diagnosis not present

## 2020-02-08 DIAGNOSIS — I517 Cardiomegaly: Secondary | ICD-10-CM | POA: Diagnosis not present

## 2020-02-08 DIAGNOSIS — Z743 Need for continuous supervision: Secondary | ICD-10-CM | POA: Diagnosis not present

## 2020-02-08 DIAGNOSIS — R52 Pain, unspecified: Secondary | ICD-10-CM | POA: Diagnosis not present

## 2020-02-08 DIAGNOSIS — Z880 Allergy status to penicillin: Secondary | ICD-10-CM | POA: Diagnosis not present

## 2020-02-08 DIAGNOSIS — Z885 Allergy status to narcotic agent status: Secondary | ICD-10-CM | POA: Diagnosis not present

## 2020-02-08 DIAGNOSIS — Z7401 Bed confinement status: Secondary | ICD-10-CM | POA: Diagnosis not present

## 2020-02-08 DIAGNOSIS — Z883 Allergy status to other anti-infective agents status: Secondary | ICD-10-CM | POA: Diagnosis not present

## 2020-02-08 DIAGNOSIS — Z888 Allergy status to other drugs, medicaments and biological substances status: Secondary | ICD-10-CM | POA: Diagnosis not present

## 2020-02-08 DIAGNOSIS — Z882 Allergy status to sulfonamides status: Secondary | ICD-10-CM | POA: Diagnosis not present

## 2020-02-08 DIAGNOSIS — M2550 Pain in unspecified joint: Secondary | ICD-10-CM | POA: Diagnosis not present

## 2020-02-08 DIAGNOSIS — R0902 Hypoxemia: Secondary | ICD-10-CM | POA: Diagnosis not present

## 2020-02-08 DIAGNOSIS — E785 Hyperlipidemia, unspecified: Secondary | ICD-10-CM | POA: Diagnosis not present

## 2020-02-09 ENCOUNTER — Telehealth: Payer: Self-pay

## 2020-02-09 DIAGNOSIS — T148XXA Other injury of unspecified body region, initial encounter: Secondary | ICD-10-CM

## 2020-02-09 NOTE — Telephone Encounter (Signed)
Needs HFU visit  OK to place referral for home health but I understand everywhere is short staffed right now  [place], ideas?

## 2020-02-09 NOTE — Telephone Encounter (Signed)
Pt called stating she was in the ER yesterday for leg pain and wound care at [facility]. Per pt, she was referred to [facility] wound care. However, no available home attendant. Pt is requesting for provider to place a referral to home health for nurse visits / wound care. Pls advise, thanks.

## 2020-02-10 ENCOUNTER — Other Ambulatory Visit: Payer: Self-pay | Admitting: Cardiology

## 2020-02-10 DIAGNOSIS — I5033 Acute on chronic diastolic (congestive) heart failure: Secondary | ICD-10-CM

## 2020-02-10 NOTE — Telephone Encounter (Signed)
NEEDS VISIT

## 2020-02-10 NOTE — Telephone Encounter (Signed)
Pt's daughter [name] called requesting an update. She mentioned that patient is unable to get services from wound care because the Head nurse is on vacation. She needs to know what she can do in the meantime for her mother. She is requesting to have a nurse/home attendant come by the home for bilateral coban wrapping for lower extremities. She is also requesting if provider can send in supplies to the pharmacy for leg ulcers. Pls advise, thanks.

## 2020-02-10 NOTE — Telephone Encounter (Signed)
Thanks, all we can do is try  Please make sure patient is scheduled for HFU next week, ok to make appt w/ covering provider in OV40 since I will b eout - depending on her situation we may need to get her into wound care center?

## 2020-02-10 NOTE — Telephone Encounter (Signed)
Please call patient to schedule appointment. Thank you

## 2020-02-10 NOTE — Telephone Encounter (Signed)
Dr. Lyn Hollingshead   On top of being short patient had [facility] insurance which is really hard to find Home Health that will take it. If you put the referral in I will do my best to get her placed with a HH agency I have a coupe I can try. - CF

## 2020-02-14 NOTE — Telephone Encounter (Signed)
Pt stated she will just see wound care. Their [facility] insurance has told them they have no home health options that are covered.

## 2020-02-15 ENCOUNTER — Other Ambulatory Visit: Payer: Self-pay

## 2020-02-15 DIAGNOSIS — L03115 Cellulitis of right lower limb: Secondary | ICD-10-CM

## 2020-02-15 DIAGNOSIS — R0602 Shortness of breath: Secondary | ICD-10-CM

## 2020-02-15 DIAGNOSIS — I872 Venous insufficiency (chronic) (peripheral): Secondary | ICD-10-CM

## 2020-02-15 NOTE — Progress Notes (Unsigned)
Arline Asp said that the home health company needs an updated referral for services. New referral order entered following the exact information entered on initial referral done on 12/27/2019.

## 2020-02-16 ENCOUNTER — Other Ambulatory Visit: Payer: Self-pay | Admitting: *Deleted

## 2020-02-16 NOTE — Patient Outreach (Signed)
[facility]) Care Management  02/16/2020  ASPYNN CLOVER November 06, 1929 [id number]   Telephone Assessment-Successful-Resolved  RN received a referral via [facility] hotline and followed up with the pt's daughter [name].  Caregiver reports pt is doing much better with her symptoms and has a follow up E-Visit appointment with her primary provided on tomorrow. Daughter lives next door and pt lives alone however has a good support system.   Further discussed pt's ongoing medical issues related to HTN/HF and inquired if further management of care is needed in these areas. Daughter appreciative but indicates her mother is very stronger will and will not comply with adherence to any plan of care. Therefore caregiver daughter opt to decline [facility] services for further case management needs at this time.   Will closed this case with no additional needs and notify the provider.  Elliot Cousin, RN Care Management Coordinator [facility] Main Office [phone]

## 2020-02-17 ENCOUNTER — Telehealth: Payer: Medicare Other | Admitting: Nurse Practitioner

## 2020-02-18 ENCOUNTER — Encounter: Payer: Self-pay | Admitting: Nurse Practitioner

## 2020-02-18 ENCOUNTER — Telehealth (INDEPENDENT_AMBULATORY_CARE_PROVIDER_SITE_OTHER): Payer: Medicare Other | Admitting: Nurse Practitioner

## 2020-02-18 ENCOUNTER — Ambulatory Visit (INDEPENDENT_AMBULATORY_CARE_PROVIDER_SITE_OTHER): Payer: Medicare Other

## 2020-02-18 DIAGNOSIS — J3489 Other specified disorders of nose and nasal sinuses: Secondary | ICD-10-CM

## 2020-02-18 DIAGNOSIS — M7989 Other specified soft tissue disorders: Secondary | ICD-10-CM

## 2020-02-18 DIAGNOSIS — I495 Sick sinus syndrome: Secondary | ICD-10-CM | POA: Diagnosis not present

## 2020-02-18 DIAGNOSIS — R059 Cough, unspecified: Secondary | ICD-10-CM

## 2020-02-18 DIAGNOSIS — R5383 Other fatigue: Secondary | ICD-10-CM | POA: Diagnosis not present

## 2020-02-18 MED ORDER — ALBUTEROL SULFATE HFA 108 (90 BASE) MCG/ACT IN AERS
1.0000 | INHALATION_SPRAY | RESPIRATORY_TRACT | 1 refills | Status: AC | PRN
Start: 2020-02-18 — End: ?

## 2020-02-18 NOTE — Progress Notes (Signed)
Virtual Visit via Telephone Note  I connected with  Katherine Cervantes on 02/18/20 at  1:10 PM EST by telephone and verified that I am speaking with the correct person using two identifiers.   I discussed the limitations, risks, security and privacy concerns of performing an evaluation and management service by telephone and the availability of in person appointments. I also discussed with the patient that there may be a patient responsible charge related to this service. The patient expressed understanding and agreed to proceed.  Participating parties included in this telephone visit include: The patient and the nurse practitioner listed and the patients daughter, [name].  The patient is: At home I am: In the office  Subjective:    CC: cough and congestion  HPI: Katherine Cervantes is a 87 y.o. year old female presenting today via telephone visit to discuss cough and congestion. Information provided by both the patient and [name].   She tells me that she has always had a "bronchial cough" that comes and goes. She is not coughing anything up. She tells me that she has had chills, but that is "normal" for her. She has had a runny nose and increased fatigue. She tells that she always has body aches, but that is not new. She tells me that her daughter had a sinus infection recently. She feels like last week her shortness of breath of worse than her baseline, but it is better this week.   She denies ear or sinus pain or pressure. She denies sore throat. She tells me that she is drinking ok and that she is eating regularly. She tells me that her taste has declined, but this has been over the past several weeks and is not knew.   She is having more swelling in her lower extremities. She reports that she has been sitting more lately and has not been up and moving around. She tells that when she is seated she tries to keep her feet elevated, she is not sure if the elevation helps with the swelling. She reports the  right leg is swelling worse than the left. She does have an appointment with the wound care center next week to be seen for a leg wound that she has had since November.   She does not use an inhaler regularly and she reports that she knows when she would need this and she doesn't feel that this is one of those times. She denies wheezing.   She is taking her lasix as directed and monitoring her fluid intake.   Her daughter [name] tells me that on Tuesday of this week she started coughing more than usual and had a runny nose. She also tells me that she ran a low grade fever for about 24 hours, but this was resolved by Wednesday. She reports that the cough has improved and her mother seems to be doing better since then. She is in agreement to come by the office next week when she has her wound care appointment to have follow-up labs drawn to monitor for worsening CHF, kidney function, or infection and if she seems to progress or regress before then, she will update [place].   Past medical history, Surgical history, Family history not pertinant except as noted below, Social history, Allergies, and medications have been entered into the medical record, reviewed, and corrections made.   Review of Systems:  All review of systems negative except what is listed in the HPI  Objective:    General:  Patient speaking clearly in complete sentences. Mild shortness of breath noted.   Alert and oriented x3- at baseline Normal judgment.  No apparent acute distress.  Impression and Recommendations:   1. Cough in adult 2. Leg swelling 3. Fatigue, unspecified type 4. Rhinorrhea - B Nat Peptide - COMPLETE METABOLIC PANEL WITH GFR - CBC with Differential - albuterol (VENTOLIN HFA) 108 (90 Base) MCG/ACT inhaler; Inhale 1-2 puffs into the lungs every 4 (four) hours as needed for wheezing or shortness of breath.  Dispense: 6.7 g; Refill: 1  Symptoms of increased cough with no increase in sputum production or  shortness of breath with rhinorrhea and low grade elevation in temperature earlier in the week. In addition lower extremity swelling, a little worse than baseline. At this time it is unclear if these symptoms are due to possibly worsening CHF or kidney function or if she has had a possible viral illness that exacerbated some of her respiratory symptoms. She has not been up and around as much, which could very well be contributing to the lower extremity edema. No signs at this time of significant illness or impairment, but I do feel she should be closely monitored given her age and co-morbidities.  She declines coming into the office today for chest x-ray or labs- she is worried about exposure, but she is willing to come in next week for labs. Instructions if symptoms worsen to please let us know immediately so that we can evaluate appropriately. She and her daughter are agreeable to this plan.  Will send albuterol inhaler for cough and shortness of breath, as she does not have one on hand and has used one in the past.  Plan to monitor and will follow-up next week with labs.    I discussed the assessment and treatment plan with the patient. The patient was provided an opportunity to ask questions and all were answered. The patient agreed with the plan and demonstrated an understanding of the instructions.   The patient was advised to call back or seek an in-person evaluation if the symptoms worsen or if the condition fails to improve as anticipated.  I provided 35 minutes of non-face-to-face time during this TELEPHONE encounter.    Tollie Eth, NP

## 2020-02-18 NOTE — Patient Instructions (Signed)
I have sent the orders in for the labs, you can have those done next week when you are out.   Please let me know immediately if her symptoms change or start to get worse. If this occurs over the weekend, please call the oncall provider or go to the hospital for evaluation if symptoms are severe or worrisome.

## 2020-02-19 LAB — CUP PACEART REMOTE DEVICE CHECK
Battery Remaining Longevity: 124 mo
Battery Remaining Percentage: 95.5 %
Battery Voltage: 3.02 V
Brady Statistic RV Percent Paced: 92 %
Date Time Interrogation Session: 20220107141926
Implantable Lead Implant Date: 20090521
Implantable Lead Implant Date: 20090521
Implantable Lead Location: 753859
Implantable Lead Location: 753860
Implantable Pulse Generator Implant Date: 20201002
Lead Channel Impedance Value: 410 Ohm
Lead Channel Pacing Threshold Amplitude: 1.125 V
Lead Channel Pacing Threshold Pulse Width: 0.7 ms
Lead Channel Sensing Intrinsic Amplitude: 9.1 mV
Lead Channel Setting Pacing Amplitude: 1.375
Lead Channel Setting Pacing Pulse Width: 0.7 ms
Lead Channel Setting Sensing Sensitivity: 2 mV
Pulse Gen Model: 2272
Pulse Gen Serial Number: 9159208

## 2020-02-21 ENCOUNTER — Telehealth: Payer: Self-pay | Admitting: Osteopathic Medicine

## 2020-02-21 NOTE — Telephone Encounter (Signed)
Please make sure this is OV40 thanks, it's actually HFU

## 2020-02-21 NOTE — Telephone Encounter (Signed)
Patient has been scheduled for Diabetic + Mood follow up with PCP on 03/08/2020 at 10:10 am! AM

## 2020-02-21 NOTE — Telephone Encounter (Signed)
Still needs visit

## 2020-02-22 NOTE — Telephone Encounter (Signed)
Appointment is 40 but changed the type to HFU. AM

## 2020-02-23 DIAGNOSIS — M069 Rheumatoid arthritis, unspecified: Secondary | ICD-10-CM | POA: Diagnosis not present

## 2020-02-23 DIAGNOSIS — L97929 Non-pressure chronic ulcer of unspecified part of left lower leg with unspecified severity: Secondary | ICD-10-CM | POA: Diagnosis not present

## 2020-02-23 DIAGNOSIS — L97919 Non-pressure chronic ulcer of unspecified part of right lower leg with unspecified severity: Secondary | ICD-10-CM | POA: Diagnosis not present

## 2020-02-23 DIAGNOSIS — I776 Arteritis, unspecified: Secondary | ICD-10-CM | POA: Diagnosis not present

## 2020-02-23 DIAGNOSIS — I482 Chronic atrial fibrillation, unspecified: Secondary | ICD-10-CM | POA: Diagnosis not present

## 2020-02-23 DIAGNOSIS — I83029 Varicose veins of left lower extremity with ulcer of unspecified site: Secondary | ICD-10-CM | POA: Diagnosis not present

## 2020-02-23 DIAGNOSIS — I872 Venous insufficiency (chronic) (peripheral): Secondary | ICD-10-CM | POA: Diagnosis not present

## 2020-02-23 DIAGNOSIS — I83019 Varicose veins of right lower extremity with ulcer of unspecified site: Secondary | ICD-10-CM | POA: Diagnosis not present

## 2020-02-23 DIAGNOSIS — I890 Lymphedema, not elsewhere classified: Secondary | ICD-10-CM | POA: Diagnosis not present

## 2020-02-23 NOTE — Telephone Encounter (Signed)
I have an appt already scheduled for patient on 03/08/20. AM

## 2020-02-23 NOTE — Telephone Encounter (Signed)
Please call patient to schedule appointment.

## 2020-02-24 LAB — COMPLETE METABOLIC PANEL WITH GFR
AG Ratio: 0.9 (calc) — ABNORMAL LOW (ref 1.0–2.5)
ALT: 12 U/L (ref 6–29)
AST: 16 U/L (ref 10–35)
Albumin: 3.8 g/dL (ref 3.6–5.1)
Alkaline phosphatase (APISO): 54 U/L (ref 37–153)
BUN/Creatinine Ratio: 23 (calc) — ABNORMAL HIGH (ref 6–22)
BUN: 31 mg/dL — ABNORMAL HIGH (ref 7–25)
CO2: 30 mmol/L (ref 20–32)
Calcium: 9.3 mg/dL (ref 8.6–10.4)
Chloride: 96 mmol/L — ABNORMAL LOW (ref 98–110)
Creat: 1.33 mg/dL — ABNORMAL HIGH (ref 0.60–0.88)
GFR, Est African American: 41 mL/min/{1.73_m2} — ABNORMAL LOW (ref >=60)
GFR, Est Non African American: 36 mL/min/{1.73_m2} — ABNORMAL LOW (ref >=60)
Globulin: 4.2 g/dL (calc) — ABNORMAL HIGH (ref 1.9–3.7)
Glucose, Bld: 120 mg/dL — ABNORMAL HIGH (ref 65–99)
Potassium: 3.2 mmol/L — ABNORMAL LOW (ref 3.5–5.3)
Sodium: 139 mmol/L (ref 135–146)
Total Bilirubin: 0.9 mg/dL (ref 0.2–1.2)
Total Protein: 8 g/dL (ref 6.1–8.1)

## 2020-02-24 LAB — CBC WITH DIFFERENTIAL/PLATELET
Absolute Monocytes: 783 cells/uL (ref 200–950)
Basophils Absolute: 53 cells/uL (ref 0–200)
Basophils Relative: 0.6 %
Eosinophils Absolute: 107 cells/uL (ref 15–500)
Eosinophils Relative: 1.2 %
HCT: 42.8 % (ref 35.0–45.0)
Hemoglobin: 14.1 g/dL (ref 11.7–15.5)
Lymphs Abs: 1326 cells/uL (ref 850–3900)
MCH: 30.5 pg (ref 27.0–33.0)
MCHC: 32.9 g/dL (ref 32.0–36.0)
MCV: 92.6 fL (ref 80.0–100.0)
MPV: 10.8 fL (ref 7.5–12.5)
Monocytes Relative: 8.8 %
Neutro Abs: 6631 cells/uL (ref 1500–7800)
Neutrophils Relative %: 74.5 %
Platelets: 282 10*3/uL (ref 140–400)
RBC: 4.62 10*6/uL (ref 3.80–5.10)
RDW: 13 % (ref 11.0–15.0)
Total Lymphocyte: 14.9 %
WBC: 8.9 10*3/uL (ref 3.8–10.8)

## 2020-02-24 LAB — BRAIN NATRIURETIC PEPTIDE: Brain Natriuretic Peptide: 215 pg/mL — ABNORMAL HIGH (ref <100)

## 2020-03-02 ENCOUNTER — Encounter: Payer: Medicare Other | Admitting: Osteopathic Medicine

## 2020-03-02 DIAGNOSIS — M7989 Other specified soft tissue disorders: Secondary | ICD-10-CM | POA: Diagnosis not present

## 2020-03-02 MED ORDER — TRAMADOL-ACETAMINOPHEN 37.5-325 MG PO TABS: 1.0000 | ORAL_TABLET | Freq: Four times a day (QID) | ORAL | 0 refills | Status: DC | PRN

## 2020-03-02 NOTE — Telephone Encounter (Signed)
Time spent 5 mins  

## 2020-03-03 NOTE — Progress Notes (Signed)
Remote pacemaker transmission.   

## 2020-03-08 ENCOUNTER — Ambulatory Visit (INDEPENDENT_AMBULATORY_CARE_PROVIDER_SITE_OTHER): Payer: Medicare Other | Admitting: Osteopathic Medicine

## 2020-03-08 ENCOUNTER — Other Ambulatory Visit: Payer: Self-pay

## 2020-03-08 ENCOUNTER — Encounter: Payer: Self-pay | Admitting: Osteopathic Medicine

## 2020-03-08 VITALS — BP 130/73 | HR 60

## 2020-03-08 DIAGNOSIS — I509 Heart failure, unspecified: Secondary | ICD-10-CM

## 2020-03-08 DIAGNOSIS — I872 Venous insufficiency (chronic) (peripheral): Secondary | ICD-10-CM

## 2020-03-08 DIAGNOSIS — B029 Zoster without complications: Secondary | ICD-10-CM | POA: Diagnosis not present

## 2020-03-08 MED ORDER — LIDOCAINE-PRILOCAINE 2.5-2.5 % EX CREA: 1.0000 "application " | TOPICAL_CREAM | CUTANEOUS | 1 refills | Status: DC | PRN

## 2020-03-08 MED ORDER — TRAMADOL HCL 50 MG PO TABS: 50.0000 mg | ORAL_TABLET | Freq: Four times a day (QID) | ORAL | 0 refills | Status: DC | PRN

## 2020-03-08 MED ORDER — VALACYCLOVIR HCL 1 G PO TABS: 1000.0000 mg | ORAL_TABLET | Freq: Three times a day (TID) | ORAL | 0 refills | Status: AC

## 2020-03-08 MED ORDER — METOPROLOL SUCCINATE ER 50 MG PO TB24: 50.0000 mg | ORAL_TABLET | Freq: Every day | ORAL | 3 refills | Status: AC

## 2020-03-08 NOTE — Patient Instructions (Addendum)
Plan:  Tramadol sent for leg pain and for shingles pain. Lidocaine cream also sent to apply to shingles on the skin.   #30 tablet for 30 days, can use every 6 hours as needed on bad days, please try to use sparingly to prevent tolerance and dependence.   Will need to see me every 3 months to maintain controlled substance prescription (per [place] law and [place] policy) - please see me sooner if you are feeling worse or like 30 pills per month might not be enough!

## 2020-03-08 NOTE — Progress Notes (Signed)
Katherine Cervantes is a 87 y.o. female who presents to  [facility] at [facility]  today, 03/08/20, seeking care for the following:  . LEG PAIN - venous stasis dermatitis following w/ wound care. Daughter is concerned and contacted our clinic 6 days ago when patient called 911 in the middle of the night for severe pain. I sent limited refill of Tramadol. Reviewed wound care recent note 02/23/20: no celulitis, wrapping and progress to lymphedema pump. On exam today, few ulcerated areas w/ granulation tissue, no necrosis or cellulitis.  Marland Kitchen RASH - R breast, painful, feels similar to previous shingles. On exam, obvious shingles across R chest w/ blistering rash in dermatomal distribution   CHRONIC ILLNESS  CARDIAC (AFIB, CHF) - no palpitations, no increase in LE swelling, no orthopnea. Doesn't need refills - taking amlodipine, lasix, Xarelto, spironolactone. Has not needed nitro.  RESPIRATORY - albuterol as needed         ASSESSMENT & PLAN with other pertinent findings:  The primary encounter diagnosis was Venous stasis dermatitis of both lower extremities. Diagnoses of Chronic congestive heart failure, unspecified heart failure type (HCC) and Herpes zoster without complication were also pertinent to this visit.    Patient Instructions  Plan:  Tramadol sent for leg pain and for shingles pain. Lidocaine cream also sent to apply to shingles on the skin.   #30 tablet for 30 days, can use every 6 hours as needed on bad days, please try to use sparingly to prevent tolerance and dependence.   Will need to see me every 3 months to maintain controlled substance prescription (per [place] law and [place] policy) - please see me sooner if you are feeling worse or like 30 pills per month might not be enough!        No orders of the defined types were placed in this encounter.   Meds ordered this encounter  Medications  . valACYclovir (VALTREX)  1000 MG tablet    Sig: Take 1 tablet (1,000 mg total) by mouth 3 (three) times daily for 10 days.    Dispense:  30 tablet    Refill:  0  . traMADol (ULTRAM) 50 MG tablet    Sig: Take 1 tablet (50 mg total) by mouth every 6 (six) hours as needed for severe pain.    Dispense:  30 tablet    Refill:  0  . lidocaine-prilocaine (EMLA) cream    Sig: Apply 1 application topically every 4 (four) hours as needed (shingles pain).    Dispense:  60 g    Refill:  1  . metoprolol succinate (TOPROL-XL) 50 MG 24 hr tablet    Sig: Take 1 tablet (50 mg total) by mouth daily.    Dispense:  90 tablet    Refill:  3       Follow-up instructions: Return in about 3 months (around 06/06/2020) for MAINTAIN PAIN MEDICATIONS, RECHECK LEGS. SEE ME SOONER IF NEEDED! .                                         BP 130/73   Pulse 60   SpO2 93%   Current Meds  Medication Sig  . acetaminophen (TYLENOL) 325 MG tablet Take 2 tablets (650 mg total) by mouth every 4 (four) hours as needed for headache or mild pain.  Marland Kitchen albuterol (VENTOLIN HFA)  108 (90 Base) MCG/ACT inhaler Inhale 1-2 puffs into the lungs every 4 (four) hours as needed for wheezing or shortness of breath.  Marland Kitchen amLODipine (NORVASC) 5 MG tablet Take 1 tablet (5 mg total) by mouth daily.  . furosemide (LASIX) 80 MG tablet Take 1 tablet (80 mg total) by mouth 2 (two) times daily.  Marland Kitchen lidocaine-prilocaine (EMLA) cream Apply 1 application topically every 4 (four) hours as needed (shingles pain).  . nitroGLYCERIN (NITROSTAT) 0.4 MG SL tablet Place 1 tablet (0.4 mg total) under the tongue every 5 (five) minutes as needed for chest pain (MAX 3 TABLETS).  . potassium chloride SA (K-DUR,KLOR-CON) 20 MEQ tablet Take 1 tablet (20 mEq total) by mouth daily.  . rivaroxaban (XARELTO) 20 MG TABS tablet Take 1 tablet (20 mg total) by mouth daily.  Marland Kitchen spironolactone (ALDACTONE) 25 MG tablet Take 1 tablet (25 mg total) by mouth daily.  .  traMADol (ULTRAM) 50 MG tablet Take 1 tablet (50 mg total) by mouth every 6 (six) hours as needed for severe pain.  . valACYclovir (VALTREX) 1000 MG tablet Take 1 tablet (1,000 mg total) by mouth 3 (three) times daily for 10 days.  . [DISCONTINUED] traMADol-acetaminophen (ULTRACET) 37.5-325 MG tablet Take 1-2 tablets by mouth every 6 (six) hours as needed.    No results found for this or any previous visit (from the past 72 hour(s)).  No results found.     All questions at time of visit were answered - patient instructed to contact office with any additional concerns or updates.  ER/RTC precautions were reviewed with the patient as applicable.   Please note: voice recognition software was used to produce this document, and typos may escape review. Please contact Dr. Lyn Hollingshead for any needed clarifications.   Total encounter time: 40 minutes spent on interview/exam, treatment plan, wrapping leg, controlled substance contract

## 2020-03-13 DIAGNOSIS — I83019 Varicose veins of right lower extremity with ulcer of unspecified site: Secondary | ICD-10-CM | POA: Diagnosis not present

## 2020-03-13 DIAGNOSIS — L97919 Non-pressure chronic ulcer of unspecified part of right lower leg with unspecified severity: Secondary | ICD-10-CM | POA: Diagnosis not present

## 2020-03-13 DIAGNOSIS — M069 Rheumatoid arthritis, unspecified: Secondary | ICD-10-CM | POA: Diagnosis not present

## 2020-03-13 DIAGNOSIS — R609 Edema, unspecified: Secondary | ICD-10-CM | POA: Diagnosis not present

## 2020-03-13 DIAGNOSIS — I872 Venous insufficiency (chronic) (peripheral): Secondary | ICD-10-CM | POA: Diagnosis not present

## 2020-03-14 ENCOUNTER — Other Ambulatory Visit: Payer: Self-pay | Admitting: Osteopathic Medicine

## 2020-03-14 DIAGNOSIS — Z950 Presence of cardiac pacemaker: Secondary | ICD-10-CM | POA: Diagnosis not present

## 2020-03-14 DIAGNOSIS — Z886 Allergy status to analgesic agent status: Secondary | ICD-10-CM | POA: Diagnosis not present

## 2020-03-14 DIAGNOSIS — I776 Arteritis, unspecified: Secondary | ICD-10-CM | POA: Diagnosis not present

## 2020-03-14 DIAGNOSIS — E860 Dehydration: Secondary | ICD-10-CM | POA: Diagnosis not present

## 2020-03-14 DIAGNOSIS — R109 Unspecified abdominal pain: Secondary | ICD-10-CM | POA: Diagnosis not present

## 2020-03-14 DIAGNOSIS — E785 Hyperlipidemia, unspecified: Secondary | ICD-10-CM | POA: Diagnosis not present

## 2020-03-14 DIAGNOSIS — Z743 Need for continuous supervision: Secondary | ICD-10-CM | POA: Diagnosis not present

## 2020-03-14 DIAGNOSIS — I4891 Unspecified atrial fibrillation: Secondary | ICD-10-CM | POA: Diagnosis not present

## 2020-03-14 DIAGNOSIS — Z20822 Contact with and (suspected) exposure to covid-19: Secondary | ICD-10-CM | POA: Diagnosis not present

## 2020-03-14 DIAGNOSIS — B029 Zoster without complications: Secondary | ICD-10-CM | POA: Diagnosis not present

## 2020-03-14 DIAGNOSIS — R079 Chest pain, unspecified: Secondary | ICD-10-CM | POA: Diagnosis not present

## 2020-03-14 DIAGNOSIS — Z885 Allergy status to narcotic agent status: Secondary | ICD-10-CM | POA: Diagnosis not present

## 2020-03-14 DIAGNOSIS — R531 Weakness: Secondary | ICD-10-CM | POA: Diagnosis not present

## 2020-03-14 DIAGNOSIS — Z7901 Long term (current) use of anticoagulants: Secondary | ICD-10-CM | POA: Diagnosis not present

## 2020-03-14 DIAGNOSIS — I517 Cardiomegaly: Secondary | ICD-10-CM | POA: Diagnosis not present

## 2020-03-14 DIAGNOSIS — R6889 Other general symptoms and signs: Secondary | ICD-10-CM | POA: Diagnosis not present

## 2020-03-14 DIAGNOSIS — Z880 Allergy status to penicillin: Secondary | ICD-10-CM | POA: Diagnosis not present

## 2020-03-14 DIAGNOSIS — Z79899 Other long term (current) drug therapy: Secondary | ICD-10-CM | POA: Diagnosis not present

## 2020-03-14 DIAGNOSIS — Z882 Allergy status to sulfonamides status: Secondary | ICD-10-CM | POA: Diagnosis not present

## 2020-03-14 DIAGNOSIS — E876 Hypokalemia: Secondary | ICD-10-CM | POA: Diagnosis not present

## 2020-03-14 DIAGNOSIS — I2510 Atherosclerotic heart disease of native coronary artery without angina pectoris: Secondary | ICD-10-CM | POA: Diagnosis not present

## 2020-03-14 DIAGNOSIS — R0989 Other specified symptoms and signs involving the circulatory and respiratory systems: Secondary | ICD-10-CM | POA: Diagnosis not present

## 2020-03-14 DIAGNOSIS — R9431 Abnormal electrocardiogram [ECG] [EKG]: Secondary | ICD-10-CM | POA: Diagnosis not present

## 2020-03-14 DIAGNOSIS — Z881 Allergy status to other antibiotic agents status: Secondary | ICD-10-CM | POA: Diagnosis not present

## 2020-03-14 DIAGNOSIS — B028 Zoster with other complications: Secondary | ICD-10-CM | POA: Diagnosis not present

## 2020-03-14 DIAGNOSIS — I872 Venous insufficiency (chronic) (peripheral): Secondary | ICD-10-CM | POA: Diagnosis not present

## 2020-03-14 DIAGNOSIS — I119 Hypertensive heart disease without heart failure: Secondary | ICD-10-CM | POA: Diagnosis not present

## 2020-03-15 ENCOUNTER — Telehealth: Payer: Self-pay

## 2020-03-15 DIAGNOSIS — M069 Rheumatoid arthritis, unspecified: Secondary | ICD-10-CM | POA: Diagnosis not present

## 2020-03-15 DIAGNOSIS — I482 Chronic atrial fibrillation, unspecified: Secondary | ICD-10-CM | POA: Diagnosis not present

## 2020-03-15 DIAGNOSIS — I872 Venous insufficiency (chronic) (peripheral): Secondary | ICD-10-CM | POA: Diagnosis not present

## 2020-03-15 DIAGNOSIS — L97919 Non-pressure chronic ulcer of unspecified part of right lower leg with unspecified severity: Secondary | ICD-10-CM | POA: Diagnosis not present

## 2020-03-15 DIAGNOSIS — Z7401 Bed confinement status: Secondary | ICD-10-CM | POA: Diagnosis not present

## 2020-03-15 DIAGNOSIS — E1151 Type 2 diabetes mellitus with diabetic peripheral angiopathy without gangrene: Secondary | ICD-10-CM | POA: Diagnosis not present

## 2020-03-15 DIAGNOSIS — R6889 Other general symptoms and signs: Secondary | ICD-10-CM | POA: Diagnosis not present

## 2020-03-15 DIAGNOSIS — R0600 Dyspnea, unspecified: Secondary | ICD-10-CM | POA: Diagnosis not present

## 2020-03-15 DIAGNOSIS — Z743 Need for continuous supervision: Secondary | ICD-10-CM | POA: Diagnosis not present

## 2020-03-15 DIAGNOSIS — M2550 Pain in unspecified joint: Secondary | ICD-10-CM | POA: Diagnosis not present

## 2020-03-15 MED ORDER — FLUCONAZOLE 150 MG PO TABS: 150.0000 mg | ORAL_TABLET | Freq: Once | ORAL | 0 refills | Status: AC

## 2020-03-15 NOTE — Telephone Encounter (Signed)
[place] from [facility] called stating that pt is having a yeast infection underneath her breasts. The RN mentioned that there is an odor. Can provider send in a antifungal rx for pt.

## 2020-03-15 NOTE — Telephone Encounter (Signed)
Rx sent to pharmacy on file.

## 2020-03-16 ENCOUNTER — Encounter: Payer: Self-pay | Admitting: Osteopathic Medicine

## 2020-03-16 NOTE — Telephone Encounter (Signed)
Referral pended for provider's review.  

## 2020-03-16 NOTE — Telephone Encounter (Signed)
Task completed. RN has been updated regarding antifungal rx. RN requested a verbal order for physical therapy. She was informed to proceed with physical therapy with patient. No other inquiries during the call.

## 2020-03-17 ENCOUNTER — Encounter (HOSPITAL_BASED_OUTPATIENT_CLINIC_OR_DEPARTMENT_OTHER): Payer: Medicare Other | Admitting: Internal Medicine

## 2020-03-17 DIAGNOSIS — M7989 Other specified soft tissue disorders: Secondary | ICD-10-CM | POA: Diagnosis not present

## 2020-03-17 DIAGNOSIS — I87331 Chronic venous hypertension (idiopathic) with ulcer and inflammation of right lower extremity: Secondary | ICD-10-CM | POA: Diagnosis not present

## 2020-03-17 DIAGNOSIS — Z888 Allergy status to other drugs, medicaments and biological substances status: Secondary | ICD-10-CM | POA: Diagnosis not present

## 2020-03-17 DIAGNOSIS — E1151 Type 2 diabetes mellitus with diabetic peripheral angiopathy without gangrene: Secondary | ICD-10-CM | POA: Diagnosis not present

## 2020-03-17 DIAGNOSIS — Z7901 Long term (current) use of anticoagulants: Secondary | ICD-10-CM | POA: Diagnosis not present

## 2020-03-17 DIAGNOSIS — R0902 Hypoxemia: Secondary | ICD-10-CM | POA: Diagnosis not present

## 2020-03-17 DIAGNOSIS — Z20822 Contact with and (suspected) exposure to covid-19: Secondary | ICD-10-CM | POA: Diagnosis not present

## 2020-03-17 DIAGNOSIS — S91001A Unspecified open wound, right ankle, initial encounter: Secondary | ICD-10-CM | POA: Diagnosis not present

## 2020-03-17 DIAGNOSIS — I87301 Chronic venous hypertension (idiopathic) without complications of right lower extremity: Secondary | ICD-10-CM | POA: Diagnosis not present

## 2020-03-17 DIAGNOSIS — L97919 Non-pressure chronic ulcer of unspecified part of right lower leg with unspecified severity: Secondary | ICD-10-CM | POA: Diagnosis not present

## 2020-03-17 DIAGNOSIS — M069 Rheumatoid arthritis, unspecified: Secondary | ICD-10-CM | POA: Diagnosis not present

## 2020-03-17 DIAGNOSIS — Z882 Allergy status to sulfonamides status: Secondary | ICD-10-CM | POA: Diagnosis not present

## 2020-03-17 DIAGNOSIS — Z885 Allergy status to narcotic agent status: Secondary | ICD-10-CM | POA: Diagnosis not present

## 2020-03-17 DIAGNOSIS — I482 Chronic atrial fibrillation, unspecified: Secondary | ICD-10-CM | POA: Diagnosis not present

## 2020-03-17 DIAGNOSIS — B029 Zoster without complications: Secondary | ICD-10-CM | POA: Diagnosis not present

## 2020-03-17 DIAGNOSIS — R531 Weakness: Secondary | ICD-10-CM | POA: Diagnosis not present

## 2020-03-17 DIAGNOSIS — Z743 Need for continuous supervision: Secondary | ICD-10-CM | POA: Diagnosis not present

## 2020-03-17 DIAGNOSIS — R52 Pain, unspecified: Secondary | ICD-10-CM | POA: Diagnosis not present

## 2020-03-17 DIAGNOSIS — I10 Essential (primary) hypertension: Secondary | ICD-10-CM | POA: Diagnosis not present

## 2020-03-17 DIAGNOSIS — L02415 Cutaneous abscess of right lower limb: Secondary | ICD-10-CM | POA: Diagnosis not present

## 2020-03-17 DIAGNOSIS — Z79899 Other long term (current) drug therapy: Secondary | ICD-10-CM | POA: Diagnosis not present

## 2020-03-17 DIAGNOSIS — I872 Venous insufficiency (chronic) (peripheral): Secondary | ICD-10-CM | POA: Diagnosis not present

## 2020-03-17 DIAGNOSIS — E1165 Type 2 diabetes mellitus with hyperglycemia: Secondary | ICD-10-CM | POA: Diagnosis not present

## 2020-03-17 DIAGNOSIS — L03115 Cellulitis of right lower limb: Secondary | ICD-10-CM | POA: Diagnosis not present

## 2020-03-17 DIAGNOSIS — R2689 Other abnormalities of gait and mobility: Secondary | ICD-10-CM | POA: Diagnosis not present

## 2020-03-18 DIAGNOSIS — I87331 Chronic venous hypertension (idiopathic) with ulcer and inflammation of right lower extremity: Secondary | ICD-10-CM | POA: Diagnosis not present

## 2020-03-18 DIAGNOSIS — L97919 Non-pressure chronic ulcer of unspecified part of right lower leg with unspecified severity: Secondary | ICD-10-CM | POA: Diagnosis not present

## 2020-03-19 DIAGNOSIS — L97919 Non-pressure chronic ulcer of unspecified part of right lower leg with unspecified severity: Secondary | ICD-10-CM | POA: Diagnosis not present

## 2020-03-19 DIAGNOSIS — I87331 Chronic venous hypertension (idiopathic) with ulcer and inflammation of right lower extremity: Secondary | ICD-10-CM | POA: Diagnosis not present

## 2020-03-19 DIAGNOSIS — L03115 Cellulitis of right lower limb: Secondary | ICD-10-CM | POA: Diagnosis not present

## 2020-03-19 DIAGNOSIS — R7881 Bacteremia: Secondary | ICD-10-CM | POA: Diagnosis not present

## 2020-03-19 DIAGNOSIS — I83019 Varicose veins of right lower extremity with ulcer of unspecified site: Secondary | ICD-10-CM | POA: Diagnosis not present

## 2020-03-19 DIAGNOSIS — I872 Venous insufficiency (chronic) (peripheral): Secondary | ICD-10-CM | POA: Diagnosis not present

## 2020-03-19 DIAGNOSIS — L02415 Cutaneous abscess of right lower limb: Secondary | ICD-10-CM | POA: Diagnosis not present

## 2020-03-20 DIAGNOSIS — I87331 Chronic venous hypertension (idiopathic) with ulcer and inflammation of right lower extremity: Secondary | ICD-10-CM | POA: Diagnosis not present

## 2020-03-20 DIAGNOSIS — R7881 Bacteremia: Secondary | ICD-10-CM | POA: Diagnosis not present

## 2020-03-20 DIAGNOSIS — L97919 Non-pressure chronic ulcer of unspecified part of right lower leg with unspecified severity: Secondary | ICD-10-CM | POA: Diagnosis not present

## 2020-03-20 DIAGNOSIS — I83019 Varicose veins of right lower extremity with ulcer of unspecified site: Secondary | ICD-10-CM | POA: Diagnosis not present

## 2020-03-20 DIAGNOSIS — I872 Venous insufficiency (chronic) (peripheral): Secondary | ICD-10-CM | POA: Diagnosis not present

## 2020-03-20 DIAGNOSIS — L03115 Cellulitis of right lower limb: Secondary | ICD-10-CM | POA: Diagnosis not present

## 2020-03-20 DIAGNOSIS — L02415 Cutaneous abscess of right lower limb: Secondary | ICD-10-CM | POA: Diagnosis not present

## 2020-03-21 DIAGNOSIS — I87331 Chronic venous hypertension (idiopathic) with ulcer and inflammation of right lower extremity: Secondary | ICD-10-CM | POA: Diagnosis not present

## 2020-03-21 DIAGNOSIS — I10 Essential (primary) hypertension: Secondary | ICD-10-CM | POA: Diagnosis not present

## 2020-03-21 DIAGNOSIS — R7881 Bacteremia: Secondary | ICD-10-CM | POA: Diagnosis not present

## 2020-03-21 DIAGNOSIS — L97919 Non-pressure chronic ulcer of unspecified part of right lower leg with unspecified severity: Secondary | ICD-10-CM | POA: Diagnosis not present

## 2020-03-21 DIAGNOSIS — R739 Hyperglycemia, unspecified: Secondary | ICD-10-CM | POA: Diagnosis not present

## 2020-03-21 DIAGNOSIS — B029 Zoster without complications: Secondary | ICD-10-CM | POA: Diagnosis not present

## 2020-03-21 DIAGNOSIS — I482 Chronic atrial fibrillation, unspecified: Secondary | ICD-10-CM | POA: Diagnosis not present

## 2020-03-22 DIAGNOSIS — Z79899 Other long term (current) drug therapy: Secondary | ICD-10-CM | POA: Diagnosis not present

## 2020-03-22 DIAGNOSIS — R609 Edema, unspecified: Secondary | ICD-10-CM | POA: Diagnosis not present

## 2020-03-22 DIAGNOSIS — B028 Zoster with other complications: Secondary | ICD-10-CM | POA: Diagnosis not present

## 2020-03-22 DIAGNOSIS — R6889 Other general symptoms and signs: Secondary | ICD-10-CM | POA: Diagnosis not present

## 2020-03-22 DIAGNOSIS — I779 Disorder of arteries and arterioles, unspecified: Secondary | ICD-10-CM | POA: Diagnosis not present

## 2020-03-22 DIAGNOSIS — I83208 Varicose veins of unspecified lower extremity with both ulcer of other part of lower extremity and inflammation: Secondary | ICD-10-CM | POA: Diagnosis not present

## 2020-03-22 DIAGNOSIS — R279 Unspecified lack of coordination: Secondary | ICD-10-CM | POA: Diagnosis not present

## 2020-03-22 DIAGNOSIS — M109 Gout, unspecified: Secondary | ICD-10-CM | POA: Diagnosis not present

## 2020-03-22 DIAGNOSIS — I110 Hypertensive heart disease with heart failure: Secondary | ICD-10-CM | POA: Diagnosis not present

## 2020-03-22 DIAGNOSIS — R5381 Other malaise: Secondary | ICD-10-CM | POA: Diagnosis not present

## 2020-03-22 DIAGNOSIS — I482 Chronic atrial fibrillation, unspecified: Secondary | ICD-10-CM | POA: Diagnosis not present

## 2020-03-22 DIAGNOSIS — I83018 Varicose veins of right lower extremity with ulcer other part of lower leg: Secondary | ICD-10-CM | POA: Diagnosis not present

## 2020-03-22 DIAGNOSIS — I5032 Chronic diastolic (congestive) heart failure: Secondary | ICD-10-CM | POA: Diagnosis not present

## 2020-03-22 DIAGNOSIS — R404 Transient alteration of awareness: Secondary | ICD-10-CM | POA: Diagnosis not present

## 2020-03-22 DIAGNOSIS — E785 Hyperlipidemia, unspecified: Secondary | ICD-10-CM | POA: Diagnosis not present

## 2020-03-22 DIAGNOSIS — M810 Age-related osteoporosis without current pathological fracture: Secondary | ICD-10-CM | POA: Diagnosis not present

## 2020-03-22 DIAGNOSIS — Z882 Allergy status to sulfonamides status: Secondary | ICD-10-CM | POA: Diagnosis not present

## 2020-03-22 DIAGNOSIS — I10 Essential (primary) hypertension: Secondary | ICD-10-CM | POA: Diagnosis not present

## 2020-03-22 DIAGNOSIS — L03115 Cellulitis of right lower limb: Secondary | ICD-10-CM | POA: Diagnosis not present

## 2020-03-22 DIAGNOSIS — R0902 Hypoxemia: Secondary | ICD-10-CM | POA: Diagnosis not present

## 2020-03-22 DIAGNOSIS — I2510 Atherosclerotic heart disease of native coronary artery without angina pectoris: Secondary | ICD-10-CM | POA: Diagnosis not present

## 2020-03-22 DIAGNOSIS — E119 Type 2 diabetes mellitus without complications: Secondary | ICD-10-CM | POA: Diagnosis not present

## 2020-03-22 DIAGNOSIS — I517 Cardiomegaly: Secondary | ICD-10-CM | POA: Diagnosis not present

## 2020-03-22 DIAGNOSIS — I878 Other specified disorders of veins: Secondary | ICD-10-CM | POA: Diagnosis not present

## 2020-03-22 DIAGNOSIS — R079 Chest pain, unspecified: Secondary | ICD-10-CM | POA: Diagnosis not present

## 2020-03-22 DIAGNOSIS — Z7901 Long term (current) use of anticoagulants: Secondary | ICD-10-CM | POA: Diagnosis not present

## 2020-03-22 DIAGNOSIS — Z9114 Patient's other noncompliance with medication regimen: Secondary | ICD-10-CM | POA: Diagnosis not present

## 2020-03-22 DIAGNOSIS — R0789 Other chest pain: Secondary | ICD-10-CM | POA: Diagnosis not present

## 2020-03-22 DIAGNOSIS — Z885 Allergy status to narcotic agent status: Secondary | ICD-10-CM | POA: Diagnosis not present

## 2020-03-22 DIAGNOSIS — L97509 Non-pressure chronic ulcer of other part of unspecified foot with unspecified severity: Secondary | ICD-10-CM | POA: Diagnosis not present

## 2020-03-22 DIAGNOSIS — I890 Lymphedema, not elsewhere classified: Secondary | ICD-10-CM | POA: Diagnosis not present

## 2020-03-22 DIAGNOSIS — B029 Zoster without complications: Secondary | ICD-10-CM | POA: Diagnosis not present

## 2020-03-22 DIAGNOSIS — I160 Hypertensive urgency: Secondary | ICD-10-CM | POA: Diagnosis not present

## 2020-03-22 DIAGNOSIS — I4892 Unspecified atrial flutter: Secondary | ICD-10-CM | POA: Diagnosis not present

## 2020-03-22 DIAGNOSIS — J45909 Unspecified asthma, uncomplicated: Secondary | ICD-10-CM | POA: Diagnosis not present

## 2020-03-22 DIAGNOSIS — L97919 Non-pressure chronic ulcer of unspecified part of right lower leg with unspecified severity: Secondary | ICD-10-CM | POA: Diagnosis not present

## 2020-03-22 DIAGNOSIS — L03116 Cellulitis of left lower limb: Secondary | ICD-10-CM | POA: Diagnosis not present

## 2020-03-22 DIAGNOSIS — I872 Venous insufficiency (chronic) (peripheral): Secondary | ICD-10-CM | POA: Diagnosis not present

## 2020-03-22 DIAGNOSIS — Z888 Allergy status to other drugs, medicaments and biological substances status: Secondary | ICD-10-CM | POA: Diagnosis not present

## 2020-03-22 DIAGNOSIS — G4730 Sleep apnea, unspecified: Secondary | ICD-10-CM | POA: Diagnosis not present

## 2020-03-22 DIAGNOSIS — I87331 Chronic venous hypertension (idiopathic) with ulcer and inflammation of right lower extremity: Secondary | ICD-10-CM | POA: Diagnosis not present

## 2020-03-22 DIAGNOSIS — R52 Pain, unspecified: Secondary | ICD-10-CM | POA: Diagnosis not present

## 2020-03-22 DIAGNOSIS — E139 Other specified diabetes mellitus without complications: Secondary | ICD-10-CM | POA: Diagnosis not present

## 2020-03-22 DIAGNOSIS — Z743 Need for continuous supervision: Secondary | ICD-10-CM | POA: Diagnosis not present

## 2020-03-22 DIAGNOSIS — Z950 Presence of cardiac pacemaker: Secondary | ICD-10-CM | POA: Diagnosis not present

## 2020-03-22 DIAGNOSIS — M069 Rheumatoid arthritis, unspecified: Secondary | ICD-10-CM | POA: Diagnosis not present

## 2020-03-24 ENCOUNTER — Telehealth: Payer: Self-pay | Admitting: Osteopathic Medicine

## 2020-03-24 NOTE — Telephone Encounter (Signed)
Patient's daughter left a form for patient to be completed. Form placed in provider box. AM

## 2020-03-27 DIAGNOSIS — I872 Venous insufficiency (chronic) (peripheral): Secondary | ICD-10-CM | POA: Diagnosis not present

## 2020-03-27 DIAGNOSIS — I83018 Varicose veins of right lower extremity with ulcer other part of lower leg: Secondary | ICD-10-CM | POA: Diagnosis not present

## 2020-03-27 DIAGNOSIS — L03115 Cellulitis of right lower limb: Secondary | ICD-10-CM | POA: Diagnosis not present

## 2020-03-27 DIAGNOSIS — R5381 Other malaise: Secondary | ICD-10-CM | POA: Diagnosis not present

## 2020-03-27 DIAGNOSIS — B029 Zoster without complications: Secondary | ICD-10-CM | POA: Diagnosis not present

## 2020-03-27 DIAGNOSIS — L03116 Cellulitis of left lower limb: Secondary | ICD-10-CM | POA: Diagnosis not present

## 2020-03-28 DIAGNOSIS — L97509 Non-pressure chronic ulcer of other part of unspecified foot with unspecified severity: Secondary | ICD-10-CM | POA: Diagnosis not present

## 2020-03-29 ENCOUNTER — Telehealth: Payer: Self-pay

## 2020-03-29 DIAGNOSIS — L03115 Cellulitis of right lower limb: Secondary | ICD-10-CM | POA: Diagnosis not present

## 2020-03-29 DIAGNOSIS — I890 Lymphedema, not elsewhere classified: Secondary | ICD-10-CM | POA: Diagnosis not present

## 2020-03-29 DIAGNOSIS — E119 Type 2 diabetes mellitus without complications: Secondary | ICD-10-CM | POA: Diagnosis not present

## 2020-03-29 DIAGNOSIS — I872 Venous insufficiency (chronic) (peripheral): Secondary | ICD-10-CM | POA: Diagnosis not present

## 2020-03-29 DIAGNOSIS — R5381 Other malaise: Secondary | ICD-10-CM | POA: Diagnosis not present

## 2020-03-29 NOTE — Telephone Encounter (Signed)
Katherine Cervantes dropped off a letter asking Dr. Lyn Hollingshead to give her a call to discuss the [facility] form that she dropped off approx.  A week and half ago.  Placing the letter in the message box.

## 2020-03-29 NOTE — Telephone Encounter (Signed)
She dropped off the form on the 11th -  I will work on this in the 5 business days this clinic's policy allows. WIll have it done by Friday.

## 2020-03-30 DIAGNOSIS — R5381 Other malaise: Secondary | ICD-10-CM | POA: Diagnosis not present

## 2020-03-30 DIAGNOSIS — L03115 Cellulitis of right lower limb: Secondary | ICD-10-CM | POA: Diagnosis not present

## 2020-03-30 DIAGNOSIS — I890 Lymphedema, not elsewhere classified: Secondary | ICD-10-CM | POA: Diagnosis not present

## 2020-03-30 DIAGNOSIS — I872 Venous insufficiency (chronic) (peripheral): Secondary | ICD-10-CM | POA: Diagnosis not present

## 2020-03-30 DIAGNOSIS — L03116 Cellulitis of left lower limb: Secondary | ICD-10-CM | POA: Diagnosis not present

## 2020-03-30 NOTE — Telephone Encounter (Signed)
Task completed. Pt's daughter [name] has been updated. She requested to get a call when form is ready for pick up.

## 2020-03-30 NOTE — Telephone Encounter (Signed)
Form in Vanicia's basket - V, please make copy to scan, and print med list to attach to form, can call family once it's ready to be picked up

## 2020-03-31 NOTE — Telephone Encounter (Signed)
Task completed. Pt's daughter has been updated that completed form is available for pick up at the front desk. Copy of form placed in scanned folder.  

## 2020-03-31 NOTE — Telephone Encounter (Signed)
Task completed. Pt's daughter has been updated that completed form is available for pick up at the front desk. Copy of form placed in scanned folder.

## 2020-04-01 DIAGNOSIS — Z79899 Other long term (current) drug therapy: Secondary | ICD-10-CM | POA: Diagnosis not present

## 2020-04-01 DIAGNOSIS — R0902 Hypoxemia: Secondary | ICD-10-CM | POA: Diagnosis not present

## 2020-04-01 DIAGNOSIS — Z885 Allergy status to narcotic agent status: Secondary | ICD-10-CM | POA: Diagnosis not present

## 2020-04-01 DIAGNOSIS — Z882 Allergy status to sulfonamides status: Secondary | ICD-10-CM | POA: Diagnosis not present

## 2020-04-01 DIAGNOSIS — M069 Rheumatoid arthritis, unspecified: Secondary | ICD-10-CM | POA: Diagnosis not present

## 2020-04-01 DIAGNOSIS — I2510 Atherosclerotic heart disease of native coronary artery without angina pectoris: Secondary | ICD-10-CM | POA: Diagnosis not present

## 2020-04-01 DIAGNOSIS — Z7901 Long term (current) use of anticoagulants: Secondary | ICD-10-CM | POA: Diagnosis not present

## 2020-04-01 DIAGNOSIS — I878 Other specified disorders of veins: Secondary | ICD-10-CM | POA: Diagnosis not present

## 2020-04-01 DIAGNOSIS — I517 Cardiomegaly: Secondary | ICD-10-CM | POA: Diagnosis not present

## 2020-04-01 DIAGNOSIS — Z743 Need for continuous supervision: Secondary | ICD-10-CM | POA: Diagnosis not present

## 2020-04-01 DIAGNOSIS — I482 Chronic atrial fibrillation, unspecified: Secondary | ICD-10-CM | POA: Diagnosis not present

## 2020-04-01 DIAGNOSIS — E139 Other specified diabetes mellitus without complications: Secondary | ICD-10-CM | POA: Diagnosis not present

## 2020-04-01 DIAGNOSIS — Z888 Allergy status to other drugs, medicaments and biological substances status: Secondary | ICD-10-CM | POA: Diagnosis not present

## 2020-04-01 DIAGNOSIS — I10 Essential (primary) hypertension: Secondary | ICD-10-CM | POA: Diagnosis not present

## 2020-04-01 DIAGNOSIS — I87331 Chronic venous hypertension (idiopathic) with ulcer and inflammation of right lower extremity: Secondary | ICD-10-CM | POA: Diagnosis not present

## 2020-04-01 DIAGNOSIS — I160 Hypertensive urgency: Secondary | ICD-10-CM | POA: Diagnosis not present

## 2020-04-01 DIAGNOSIS — L97919 Non-pressure chronic ulcer of unspecified part of right lower leg with unspecified severity: Secondary | ICD-10-CM | POA: Diagnosis not present

## 2020-04-01 DIAGNOSIS — Z950 Presence of cardiac pacemaker: Secondary | ICD-10-CM | POA: Diagnosis not present

## 2020-04-01 DIAGNOSIS — B029 Zoster without complications: Secondary | ICD-10-CM | POA: Diagnosis not present

## 2020-04-01 DIAGNOSIS — R079 Chest pain, unspecified: Secondary | ICD-10-CM | POA: Diagnosis not present

## 2020-04-01 DIAGNOSIS — B028 Zoster with other complications: Secondary | ICD-10-CM | POA: Diagnosis not present

## 2020-04-01 DIAGNOSIS — R6889 Other general symptoms and signs: Secondary | ICD-10-CM | POA: Diagnosis not present

## 2020-04-01 DIAGNOSIS — R52 Pain, unspecified: Secondary | ICD-10-CM | POA: Diagnosis not present

## 2020-04-01 DIAGNOSIS — E119 Type 2 diabetes mellitus without complications: Secondary | ICD-10-CM | POA: Diagnosis not present

## 2020-04-01 DIAGNOSIS — R0789 Other chest pain: Secondary | ICD-10-CM | POA: Diagnosis not present

## 2020-04-02 DIAGNOSIS — I083 Combined rheumatic disorders of mitral, aortic and tricuspid valves: Secondary | ICD-10-CM | POA: Diagnosis not present

## 2020-04-02 DIAGNOSIS — R079 Chest pain, unspecified: Secondary | ICD-10-CM | POA: Diagnosis not present

## 2020-04-03 DIAGNOSIS — I10 Essential (primary) hypertension: Secondary | ICD-10-CM | POA: Diagnosis not present

## 2020-04-03 DIAGNOSIS — I482 Chronic atrial fibrillation, unspecified: Secondary | ICD-10-CM | POA: Diagnosis not present

## 2020-04-03 DIAGNOSIS — Z7901 Long term (current) use of anticoagulants: Secondary | ICD-10-CM | POA: Diagnosis not present

## 2020-04-03 DIAGNOSIS — I83208 Varicose veins of unspecified lower extremity with both ulcer of other part of lower extremity and inflammation: Secondary | ICD-10-CM | POA: Diagnosis not present

## 2020-04-03 DIAGNOSIS — I83029 Varicose veins of left lower extremity with ulcer of unspecified site: Secondary | ICD-10-CM | POA: Diagnosis not present

## 2020-04-03 DIAGNOSIS — Z950 Presence of cardiac pacemaker: Secondary | ICD-10-CM | POA: Diagnosis not present

## 2020-04-03 DIAGNOSIS — Z9114 Patient's other noncompliance with medication regimen: Secondary | ICD-10-CM | POA: Diagnosis not present

## 2020-04-03 DIAGNOSIS — B0229 Other postherpetic nervous system involvement: Secondary | ICD-10-CM | POA: Diagnosis not present

## 2020-04-03 DIAGNOSIS — Z888 Allergy status to other drugs, medicaments and biological substances status: Secondary | ICD-10-CM | POA: Diagnosis not present

## 2020-04-03 DIAGNOSIS — J45909 Unspecified asthma, uncomplicated: Secondary | ICD-10-CM | POA: Diagnosis not present

## 2020-04-03 DIAGNOSIS — L97929 Non-pressure chronic ulcer of unspecified part of left lower leg with unspecified severity: Secondary | ICD-10-CM | POA: Diagnosis not present

## 2020-04-03 DIAGNOSIS — I83019 Varicose veins of right lower extremity with ulcer of unspecified site: Secondary | ICD-10-CM | POA: Diagnosis not present

## 2020-04-03 DIAGNOSIS — I4892 Unspecified atrial flutter: Secondary | ICD-10-CM | POA: Diagnosis not present

## 2020-04-03 DIAGNOSIS — Z885 Allergy status to narcotic agent status: Secondary | ICD-10-CM | POA: Diagnosis not present

## 2020-04-03 DIAGNOSIS — L03116 Cellulitis of left lower limb: Secondary | ICD-10-CM | POA: Diagnosis not present

## 2020-04-03 DIAGNOSIS — Z79899 Other long term (current) drug therapy: Secondary | ICD-10-CM | POA: Diagnosis not present

## 2020-04-03 DIAGNOSIS — M810 Age-related osteoporosis without current pathological fracture: Secondary | ICD-10-CM | POA: Diagnosis not present

## 2020-04-03 DIAGNOSIS — L97919 Non-pressure chronic ulcer of unspecified part of right lower leg with unspecified severity: Secondary | ICD-10-CM | POA: Diagnosis not present

## 2020-04-03 DIAGNOSIS — R5381 Other malaise: Secondary | ICD-10-CM | POA: Diagnosis not present

## 2020-04-03 DIAGNOSIS — E785 Hyperlipidemia, unspecified: Secondary | ICD-10-CM | POA: Diagnosis not present

## 2020-04-03 DIAGNOSIS — R079 Chest pain, unspecified: Secondary | ICD-10-CM | POA: Diagnosis not present

## 2020-04-03 DIAGNOSIS — L03115 Cellulitis of right lower limb: Secondary | ICD-10-CM | POA: Diagnosis not present

## 2020-04-03 DIAGNOSIS — I83018 Varicose veins of right lower extremity with ulcer other part of lower leg: Secondary | ICD-10-CM | POA: Diagnosis not present

## 2020-04-03 DIAGNOSIS — M109 Gout, unspecified: Secondary | ICD-10-CM | POA: Diagnosis not present

## 2020-04-03 DIAGNOSIS — I878 Other specified disorders of veins: Secondary | ICD-10-CM | POA: Diagnosis not present

## 2020-04-03 DIAGNOSIS — I5032 Chronic diastolic (congestive) heart failure: Secondary | ICD-10-CM | POA: Diagnosis not present

## 2020-04-03 DIAGNOSIS — R609 Edema, unspecified: Secondary | ICD-10-CM | POA: Diagnosis not present

## 2020-04-03 DIAGNOSIS — I890 Lymphedema, not elsewhere classified: Secondary | ICD-10-CM | POA: Diagnosis not present

## 2020-04-03 DIAGNOSIS — M069 Rheumatoid arthritis, unspecified: Secondary | ICD-10-CM | POA: Diagnosis not present

## 2020-04-03 DIAGNOSIS — B029 Zoster without complications: Secondary | ICD-10-CM | POA: Diagnosis not present

## 2020-04-03 DIAGNOSIS — E119 Type 2 diabetes mellitus without complications: Secondary | ICD-10-CM | POA: Diagnosis not present

## 2020-04-03 DIAGNOSIS — I779 Disorder of arteries and arterioles, unspecified: Secondary | ICD-10-CM | POA: Diagnosis not present

## 2020-04-03 DIAGNOSIS — I110 Hypertensive heart disease with heart failure: Secondary | ICD-10-CM | POA: Diagnosis not present

## 2020-04-03 DIAGNOSIS — Z882 Allergy status to sulfonamides status: Secondary | ICD-10-CM | POA: Diagnosis not present

## 2020-04-03 DIAGNOSIS — I872 Venous insufficiency (chronic) (peripheral): Secondary | ICD-10-CM | POA: Diagnosis not present

## 2020-04-03 DIAGNOSIS — G4730 Sleep apnea, unspecified: Secondary | ICD-10-CM | POA: Diagnosis not present

## 2020-04-03 DIAGNOSIS — I2510 Atherosclerotic heart disease of native coronary artery without angina pectoris: Secondary | ICD-10-CM | POA: Diagnosis not present

## 2020-04-04 DIAGNOSIS — B029 Zoster without complications: Secondary | ICD-10-CM | POA: Diagnosis not present

## 2020-04-04 DIAGNOSIS — R079 Chest pain, unspecified: Secondary | ICD-10-CM | POA: Diagnosis not present

## 2020-04-05 DIAGNOSIS — L97919 Non-pressure chronic ulcer of unspecified part of right lower leg with unspecified severity: Secondary | ICD-10-CM | POA: Diagnosis not present

## 2020-04-05 DIAGNOSIS — I872 Venous insufficiency (chronic) (peripheral): Secondary | ICD-10-CM | POA: Diagnosis not present

## 2020-04-05 DIAGNOSIS — I890 Lymphedema, not elsewhere classified: Secondary | ICD-10-CM | POA: Diagnosis not present

## 2020-04-05 DIAGNOSIS — I83019 Varicose veins of right lower extremity with ulcer of unspecified site: Secondary | ICD-10-CM | POA: Diagnosis not present

## 2020-04-05 DIAGNOSIS — I83029 Varicose veins of left lower extremity with ulcer of unspecified site: Secondary | ICD-10-CM | POA: Diagnosis not present

## 2020-04-05 DIAGNOSIS — L03116 Cellulitis of left lower limb: Secondary | ICD-10-CM | POA: Diagnosis not present

## 2020-04-05 DIAGNOSIS — R5381 Other malaise: Secondary | ICD-10-CM | POA: Diagnosis not present

## 2020-04-05 DIAGNOSIS — L97929 Non-pressure chronic ulcer of unspecified part of left lower leg with unspecified severity: Secondary | ICD-10-CM | POA: Diagnosis not present

## 2020-04-05 DIAGNOSIS — L03115 Cellulitis of right lower limb: Secondary | ICD-10-CM | POA: Diagnosis not present

## 2020-04-06 DIAGNOSIS — R079 Chest pain, unspecified: Secondary | ICD-10-CM | POA: Diagnosis not present

## 2020-04-06 DIAGNOSIS — L03116 Cellulitis of left lower limb: Secondary | ICD-10-CM | POA: Diagnosis not present

## 2020-04-06 DIAGNOSIS — R5381 Other malaise: Secondary | ICD-10-CM | POA: Diagnosis not present

## 2020-04-06 DIAGNOSIS — L03115 Cellulitis of right lower limb: Secondary | ICD-10-CM | POA: Diagnosis not present

## 2020-04-10 DIAGNOSIS — I83018 Varicose veins of right lower extremity with ulcer other part of lower leg: Secondary | ICD-10-CM | POA: Diagnosis not present

## 2020-04-12 ENCOUNTER — Telehealth: Payer: Self-pay

## 2020-04-12 NOTE — Telephone Encounter (Signed)
Depending what EMT / EMS does, pt needs visit (virtual ok) to evaluate - ok to see NP

## 2020-04-12 NOTE — Telephone Encounter (Signed)
Katherine Cervantes called about her mother today. She said that pt is currently at [facility] center. Katherine Cervantes mentioned that her mother currently has Shingles and is in a great deal of pain. Pt is on gabapentin 100 mg/2 tabs/bid. However, it is not helping at all with the pain. Pt called EMT because the pain was intolerable. Requesting recommendations from provider. Pls advise, thanks.

## 2020-04-13 DIAGNOSIS — I872 Venous insufficiency (chronic) (peripheral): Secondary | ICD-10-CM | POA: Diagnosis not present

## 2020-04-13 DIAGNOSIS — L03115 Cellulitis of right lower limb: Secondary | ICD-10-CM | POA: Diagnosis not present

## 2020-04-13 DIAGNOSIS — B029 Zoster without complications: Secondary | ICD-10-CM | POA: Diagnosis not present

## 2020-04-13 DIAGNOSIS — L03116 Cellulitis of left lower limb: Secondary | ICD-10-CM | POA: Diagnosis not present

## 2020-04-13 DIAGNOSIS — R5381 Other malaise: Secondary | ICD-10-CM | POA: Diagnosis not present

## 2020-04-13 DIAGNOSIS — I890 Lymphedema, not elsewhere classified: Secondary | ICD-10-CM | POA: Diagnosis not present

## 2020-04-13 NOTE — Telephone Encounter (Signed)
Task completed. [name] has been updated and notified of provider's recommendation. Agreeable with provider's plan. Transferred to scheduler's desk to make an appt.

## 2020-04-14 ENCOUNTER — Telehealth (INDEPENDENT_AMBULATORY_CARE_PROVIDER_SITE_OTHER): Payer: Medicare Other | Admitting: Family Medicine

## 2020-04-14 ENCOUNTER — Encounter: Payer: Self-pay | Admitting: Family Medicine

## 2020-04-14 DIAGNOSIS — B029 Zoster without complications: Secondary | ICD-10-CM

## 2020-04-14 DIAGNOSIS — B0229 Other postherpetic nervous system involvement: Secondary | ICD-10-CM | POA: Diagnosis not present

## 2020-04-14 MED ORDER — GABAPENTIN 300 MG PO CAPS: 300.0000 mg | ORAL_CAPSULE | Freq: Two times a day (BID) | ORAL | 0 refills | Status: DC

## 2020-04-14 NOTE — Progress Notes (Signed)
Virtual Video Visit via MyChart Note  I connected with  Katherine Cervantes on 04/14/20 at  4:00 PM EST by the video enabled telemedicine application for MyChart, and verified that I am speaking with the correct person using two identifiers.   I introduced myself as a Publishing rights manager with the practice. We discussed the limitations of evaluation and management by telemedicine and the availability of in person appointments. The patient expressed understanding and agreed to proceed.  Participating parties in this visit include: The patient and the nurse practitioner listed and daughter, [name]. The patient is: At home I am: In the office - [facility]  Subjective:    CC: No chief complaint on file.   HPI: Katherine Cervantes is a 87 y.o. year old female presenting today via MyChart today for shingles.  It has been about 2-3 weeks since shingles rash to right neck/breast/back area. States she saw Dr. Lyn Hollingshead when they had been there for about 5 days - she was given an antiviral, but  Did not notice a difference. Since then she has had to go in the hospital venous stasis dermatitis, and was given gabapentin 200 mg BID for the pain. She states this is really not helping much at all. Her pain is 6-8/10 worse with palpation. She has been trying to wear soft clothes that won't rub it too hard. Nothing else has seemed to help. She is currently staying at [facility] following her hospital stay and states that a doctor is only in the building on Mondays and she doesn't want to be in pain all weekend. Daughter is present for video call and attempted to let me visualize the rash, but it was difficult to get a clear view through the video connection. She denies fever, spread, chest pain, shortness of breath. She also reports some mild redness and irritation to her left eye, but states it isn't too bad and she would rather just get the shingles pain under control for now - recommend she keep a close  watch on her eye and let the staff know of any concerns so the provider can visualize it better in person.    Past medical history, Surgical history, Family history not pertinant except as noted below, Social history, Allergies, and medications have been entered into the medical record, reviewed, and corrections made.   Review of Systems:  All review of systems negative except what is listed in the HPI   Objective:    General:  Speaking clearly in complete sentences. Absent shortness of breath noted.   Alert and oriented x3.   Normal judgment.  Absent acute distress. Attempted to visualize rash, but poor picture on video.   Impression and Recommendations:    1. Postherpetic neuralgia 2. Herpes zoster without complication  Patient with postherpetic neuralgia as shingles rash is healing - scabbed over now per pt. She has not had adequate relief on 200 mg gabapentin BID - states it has not made her overly drowsy. Will go ahead and increase to 300 mg gabapentin BID and see if this makes a difference. Supportive care also encouraged including Vaseline gauze and cool compresses.   Faxing prescription to [facility]: [phone]  Follow-up if symptoms worsen or fail to improve.    I discussed the assessment and treatment plan with the patient. The patient was provided an opportunity to ask questions and all were answered. The patient agreed with the plan and demonstrated an understanding of the instructions.   The patient  was advised to call back or seek an in-person evaluation if the symptoms worsen or if the condition fails to improve as anticipated.  I provided 20 minutes of non-face-to-face interaction with this MYCHART visit including intake, same-day documentation, and chart review.   Clayborne Dana, NP

## 2020-04-14 NOTE — Patient Instructions (Signed)
Shingles  Shingles is an infection. It gives you a painful skin rash and blisters that have fluid in them. Shingles is caused by the same germ (virus) that causes chickenpox. Shingles only happens in people who:  Have had chickenpox.  Have been given a shot of medicine (vaccine) to protect against chickenpox. Shingles is rare in this group. The first symptoms of shingles may be itching, tingling, or pain in an area on your skin. A rash will show on your skin a few days or weeks later. The rash is likely to be on one side of your body. The rash usually has a shape like a belt or a band. Over time, the rash turns into fluid-filled blisters. The blisters will break open, change into scabs, and dry up. Medicines may:  Help with pain and itching.  Help you get better sooner.  Help to prevent long-term problems. Follow these instructions at home: Medicines  Take over-the-counter and prescription medicines only as told by your doctor.  Put on an anti-itch cream or numbing cream where you have a rash, blisters, or scabs. Do this as told by your doctor. Helping with itching and discomfort  Put cold, wet cloths (cold compresses) on the area of the rash or blisters as told by your doctor.  Cool baths can help you feel better. Try adding baking soda or dry oatmeal to the water to lessen itching. Do not bathe in hot water.   Blister and rash care  Keep your rash covered with a loose bandage (dressing).  Wear loose clothing that does not rub on your rash.  Keep your rash and blisters clean. To do this, wash the area with mild soap and cool water as told by your doctor.  Check your rash every day for signs of infection. Check for: ? More redness, swelling, or pain. ? Fluid or blood. ? Warmth. ? Pus or a bad smell.  Do not scratch your rash. Do not pick at your blisters. To help you to not scratch: ? Keep your fingernails clean and cut short. ? Wear gloves or mittens when you sleep, if  scratching is a problem. General instructions  Rest as told by your doctor.  Keep all follow-up visits as told by your doctor. This is important.  Wash your hands often with soap and water. If soap and water are not available, use hand sanitizer. Doing this lowers your chance of getting a skin infection caused by germs (bacteria).  Your infection can cause chickenpox in people who have never had chickenpox or never got a shot of chickenpox vaccine. If you have blisters that did not change into scabs yet, try not to touch other people or be around other people, especially: ? Babies. ? Pregnant women. ? Children who have areas of red, itchy, or rough skin (eczema). ? Very old people who have transplants. ? People who have a long-term (chronic) sickness, like cancer or AIDS. Contact a doctor if:  Your pain does not get better with medicine.  Your pain does not get better after the rash heals.  You have any signs of infection in the rash area. These signs include: ? More redness, swelling, or pain around the rash. ? Fluid or blood coming from the rash. ? The rash area feeling warm to the touch. ? Pus or a bad smell coming from the rash. Get help right away if:  The rash is on your face or nose.  You have pain in your face or pain   by your eye.  You lose feeling on one side of your face.  You have trouble seeing.  You have ear pain, or you have ringing in your ear.  You have a loss of taste.  Your condition gets worse. Summary  Shingles gives you a painful skin rash and blisters that have fluid in them.  Shingles is an infection. It is caused by the same germ (virus) that causes chickenpox.  Keep your rash covered with a loose bandage (dressing). Wear loose clothing that does not rub on your rash.  If you have blisters that did not change into scabs yet, try not to touch other people or be around people. This information is not intended to replace advice given to you by  your health care provider. Make sure you discuss any questions you have with your health care provider. Document Revised: 05/22/2018 Document Reviewed: 10/02/2016 Elsevier Patient Education  2021 Elsevier Inc.  

## 2020-04-17 DIAGNOSIS — R2681 Unsteadiness on feet: Secondary | ICD-10-CM | POA: Diagnosis not present

## 2020-04-17 DIAGNOSIS — L259 Unspecified contact dermatitis, unspecified cause: Secondary | ICD-10-CM | POA: Diagnosis not present

## 2020-04-17 DIAGNOSIS — R296 Repeated falls: Secondary | ICD-10-CM | POA: Diagnosis not present

## 2020-04-18 DIAGNOSIS — H1032 Unspecified acute conjunctivitis, left eye: Secondary | ICD-10-CM | POA: Diagnosis not present

## 2020-04-20 DIAGNOSIS — M792 Neuralgia and neuritis, unspecified: Secondary | ICD-10-CM | POA: Diagnosis not present

## 2020-04-20 DIAGNOSIS — B029 Zoster without complications: Secondary | ICD-10-CM | POA: Diagnosis not present

## 2020-04-21 DIAGNOSIS — L03116 Cellulitis of left lower limb: Secondary | ICD-10-CM | POA: Diagnosis not present

## 2020-04-21 DIAGNOSIS — I872 Venous insufficiency (chronic) (peripheral): Secondary | ICD-10-CM | POA: Diagnosis not present

## 2020-04-21 DIAGNOSIS — B029 Zoster without complications: Secondary | ICD-10-CM | POA: Diagnosis not present

## 2020-04-21 DIAGNOSIS — I890 Lymphedema, not elsewhere classified: Secondary | ICD-10-CM | POA: Diagnosis not present

## 2020-04-21 DIAGNOSIS — H1032 Unspecified acute conjunctivitis, left eye: Secondary | ICD-10-CM | POA: Diagnosis not present

## 2020-04-21 DIAGNOSIS — L03115 Cellulitis of right lower limb: Secondary | ICD-10-CM | POA: Diagnosis not present

## 2020-04-21 DIAGNOSIS — M792 Neuralgia and neuritis, unspecified: Secondary | ICD-10-CM | POA: Diagnosis not present

## 2020-04-21 DIAGNOSIS — R5381 Other malaise: Secondary | ICD-10-CM | POA: Diagnosis not present

## 2020-04-23 ENCOUNTER — Encounter: Payer: Medicare Other | Admitting: Osteopathic Medicine

## 2020-04-23 ENCOUNTER — Encounter: Payer: Self-pay | Admitting: Osteopathic Medicine

## 2020-04-23 DIAGNOSIS — B0229 Other postherpetic nervous system involvement: Secondary | ICD-10-CM

## 2020-04-23 DIAGNOSIS — B029 Zoster without complications: Secondary | ICD-10-CM

## 2020-04-23 NOTE — Progress Notes (Signed)
Received call from answering service today approx 3:30 pm re: tramadol refill vs gabapentin Rx adjustment. I advised RN clinic policy will not adjust/refill rx particularly controlled substances over the weekend. If new/concerning pain, patient needs evaluation in UC/ER. RN agrees to pass message to caller.

## 2020-04-24 ENCOUNTER — Other Ambulatory Visit: Payer: Self-pay

## 2020-04-24 ENCOUNTER — Telehealth: Payer: Self-pay

## 2020-04-24 DIAGNOSIS — I2510 Atherosclerotic heart disease of native coronary artery without angina pectoris: Secondary | ICD-10-CM | POA: Diagnosis not present

## 2020-04-24 DIAGNOSIS — I110 Hypertensive heart disease with heart failure: Secondary | ICD-10-CM | POA: Diagnosis not present

## 2020-04-24 DIAGNOSIS — I4821 Permanent atrial fibrillation: Secondary | ICD-10-CM | POA: Diagnosis not present

## 2020-04-24 DIAGNOSIS — J45909 Unspecified asthma, uncomplicated: Secondary | ICD-10-CM | POA: Diagnosis not present

## 2020-04-24 DIAGNOSIS — I5033 Acute on chronic diastolic (congestive) heart failure: Secondary | ICD-10-CM | POA: Diagnosis not present

## 2020-04-24 DIAGNOSIS — E119 Type 2 diabetes mellitus without complications: Secondary | ICD-10-CM | POA: Diagnosis not present

## 2020-04-24 DIAGNOSIS — I10 Essential (primary) hypertension: Secondary | ICD-10-CM

## 2020-04-24 MED ORDER — GABAPENTIN 400 MG PO CAPS
400.0000 mg | ORAL_CAPSULE | Freq: Three times a day (TID) | ORAL | 0 refills | Status: DC
Start: 2020-04-24 — End: 2020-06-30

## 2020-04-24 NOTE — Telephone Encounter (Signed)
Patient's proxy contacted office via MyChart >7 days from their last visit, about an issue unrelated to that visit. They did this independently (not in response to inquiry from this office). 5 minutes spent by myself and our staff in addressing this issue. Billed appropriately for services related to this encounter with diagnosis/diagnoses below:  Diagnoses of Postherpetic neuralgia and Herpes zoster without complication were pertinent to this visit.  -->gabapentin increased from 300 mg bid   Meds ordered this encounter  Medications  . gabapentin (NEURONTIN) 400 MG capsule    Sig: Take 1 capsule (400 mg total) by mouth 3 (three) times daily.    Dispense:  90 capsule    Refill:  0    No orders of the defined types were placed in this encounter.   See MyChart message

## 2020-04-25 ENCOUNTER — Inpatient Hospital Stay: Payer: Medicare Other | Admitting: Osteopathic Medicine

## 2020-04-26 ENCOUNTER — Telehealth: Payer: Self-pay

## 2020-04-26 ENCOUNTER — Telehealth: Payer: Self-pay | Admitting: *Deleted

## 2020-04-26 NOTE — Chronic Care Management (AMB) (Signed)
  Chronic Care Management   Note  04/26/2020 Name: Katherine Cervantes MRN: [id number] DOB: [birth year]  [name] is a 87 y.o. year old female who is a primary care patient of Emeterio Reeve, DO. I reached out to Zachery Dauer by phone today in response to a referral sent by Ms. Jomarie Longs PCP, Emeterio Reeve, DO     Ms. Stopper was given information about Chronic Care Management services today including:  1. CCM service includes personalized support from designated clinical staff supervised by her physician, including individualized plan of care and coordination with other care providers 2. 24/7 contact phone numbers for assistance for urgent and routine care needs. 3. Service will only be billed when office clinical staff spend 20 minutes or more in a month to coordinate care. 4. Only one practitioner may furnish and bill the service in a calendar month. 5. The patient may stop CCM services at any time (effective at the end of the month) by phone call to the office staff. 6. The patient will be responsible for cost sharing (co-pay) of up to 20% of the service fee (after annual deductible is met).  Patient's daughter states patient is in a nursing home and  did not agree to enrollment in care management services and does not wish to consider at this time.  Follow up plan:  The care management team is available to follow up with the patient after provider conversation with the patient regarding recommendation for care management engagement and subsequent re-referral to the care management team.   [facility] [facility]

## 2020-04-26 NOTE — Telephone Encounter (Signed)
[name] called on behalf of patient. Per [name], patient has been admitted into assisted living facility. She wants to know if provider will continue to overlook patient's care or is it ok to have a NP from [facility] located in [facility] take over patient's treatment. [name] mentioned that patient is scheduled for a visit with NP next Wednesday. Pls advise, thanks.

## 2020-04-26 NOTE — Telephone Encounter (Signed)
Certainly fine w/ me if they want the NP to take over. If anything needed from me, let me know.

## 2020-04-27 DIAGNOSIS — I4821 Permanent atrial fibrillation: Secondary | ICD-10-CM | POA: Diagnosis not present

## 2020-04-27 DIAGNOSIS — I110 Hypertensive heart disease with heart failure: Secondary | ICD-10-CM | POA: Diagnosis not present

## 2020-04-27 DIAGNOSIS — I5033 Acute on chronic diastolic (congestive) heart failure: Secondary | ICD-10-CM | POA: Diagnosis not present

## 2020-04-27 DIAGNOSIS — J45909 Unspecified asthma, uncomplicated: Secondary | ICD-10-CM | POA: Diagnosis not present

## 2020-04-27 DIAGNOSIS — E119 Type 2 diabetes mellitus without complications: Secondary | ICD-10-CM | POA: Diagnosis not present

## 2020-04-27 DIAGNOSIS — I2510 Atherosclerotic heart disease of native coronary artery without angina pectoris: Secondary | ICD-10-CM | POA: Diagnosis not present

## 2020-04-27 NOTE — Telephone Encounter (Signed)
Task completed. Left a detailed vm msg for pt's daughter regarding provider's note. Direct call back info provided.

## 2020-04-28 DIAGNOSIS — E119 Type 2 diabetes mellitus without complications: Secondary | ICD-10-CM | POA: Diagnosis not present

## 2020-04-28 DIAGNOSIS — I110 Hypertensive heart disease with heart failure: Secondary | ICD-10-CM | POA: Diagnosis not present

## 2020-04-28 DIAGNOSIS — I2510 Atherosclerotic heart disease of native coronary artery without angina pectoris: Secondary | ICD-10-CM | POA: Diagnosis not present

## 2020-04-28 DIAGNOSIS — I5033 Acute on chronic diastolic (congestive) heart failure: Secondary | ICD-10-CM | POA: Diagnosis not present

## 2020-04-28 DIAGNOSIS — J45909 Unspecified asthma, uncomplicated: Secondary | ICD-10-CM | POA: Diagnosis not present

## 2020-04-28 DIAGNOSIS — I4821 Permanent atrial fibrillation: Secondary | ICD-10-CM | POA: Diagnosis not present

## 2020-04-28 NOTE — Progress Notes (Deleted)
Cardiology Office Note Date:  04/28/2020  Patient ID:  Cervantes, Katherine 07/24/1929, MRN [id number] PCP:  Sunnie Nielsen, DO  Cardiologist:  Dr. Duke Salvia Electrophysiologist: Dr. Ladona Ridgel  ***refresh   Chief Complaint: *** over due device visit  History of Present Illness: Katherine Cervantes is a 87 y.o. female with history of anxiety, s inus node dysfunction w/PPM, HTN, HLD, RA, AFib, DM, RBBB  He comes in today to be seen for Dr. Ladona Ridgel, last seen by him when he had his PPM placed.  He did see [name], PA for his wound check visit though no OV since then.  Hospitalized Feb 2022 for exacerbation of venous stasis dermatitis, nonhealing wound and herpes zoster > SNF 03/22/20. Back to the hospital with c/o CP (R sided at site of her zoster dermatone).  Had an elevated dimer with negative CT.  Described as cheonic rate controlled AFib with intermittent pacing on telemetry. Echo with LVEF 60-65% Discharged back to SNF 04/03/20  + remotes  *** symptoms *** xarelto, bleeding, dose, labs *** labs, lipids   Device information SJM dual chamber PPM implanted 07/02/2007, gen change 11/13/2018   Past Medical History:  Diagnosis Date  . Anticoagulation adequate 05/22/2018  . Asthma   . Atrial fibrillation (HCC) 09/27/2011  . CAD (coronary artery disease) 09/27/2011  . Diabetes mellitus without complication (HCC)   . Diverticulosis 11/04/2011  . Dyslipidemia 09/27/2011  . History of vasculitis 09/27/2011  . Hypertension 09/27/2011  . Incontinence 10/15/2015  . Morbid obesity with BMI of 40.0-44.9, adult (HCC) 10/03/2016  . Osteoporosis 09/27/2011  . Pacemaker   . PUD (peptic ulcer disease)   . Rheumatoid arthritis(714.0) 09/27/2011  . Short-term memory loss 10/03/2016  . Sleep apnea 09/27/2011    Past Surgical History:  Procedure Laterality Date  . ABDOMINAL HYSTERECTOMY    . ANKLE SURGERY    . APPENDECTOMY    . Carpal tunnel    . CHOLECYSTECTOMY    . PARTIAL GASTRECTOMY    . PPM  GENERATOR CHANGEOUT N/A 11/13/2018   Procedure: PPM GENERATOR CHANGEOUT;  Surgeon: Marinus Maw, MD;  Location: [facility] INVASIVE CV LAB;  Service: Cardiovascular;  Laterality: N/A;  . REPLACEMENT TOTAL KNEE Bilateral     Current Outpatient Medications  Medication Sig Dispense Refill  . acetaminophen (TYLENOL) 325 MG tablet Take 2 tablets (650 mg total) by mouth every 4 (four) hours as needed for headache or mild pain.    Marland Kitchen albuterol (VENTOLIN HFA) 108 (90 Base) MCG/ACT inhaler Inhale 1-2 puffs into the lungs every 4 (four) hours as needed for wheezing or shortness of breath. 6.7 g 1  . amLODipine (NORVASC) 5 MG tablet Take 1 tablet (5 mg total) by mouth daily. 30 tablet 3  . furosemide (LASIX) 80 MG tablet Take 1 tablet (80 mg total) by mouth 2 (two) times daily. 180 tablet 3  . gabapentin (NEURONTIN) 400 MG capsule Take 1 capsule (400 mg total) by mouth 3 (three) times daily. 90 capsule 0  . lidocaine-prilocaine (EMLA) cream Apply 1 application topically every 4 (four) hours as needed (shingles pain). 60 g 1  . metoprolol succinate (TOPROL-XL) 50 MG 24 hr tablet Take 1 tablet (50 mg total) by mouth daily. 90 tablet 3  . nitroGLYCERIN (NITROSTAT) 0.4 MG SL tablet Place 1 tablet (0.4 mg total) under the tongue every 5 (five) minutes as needed for chest pain (MAX 3 TABLETS). 25 tablet 3  . potassium chloride SA (K-DUR,KLOR-CON) 20 MEQ tablet Take 1  tablet (20 mEq total) by mouth daily. 30 tablet 6  . rivaroxaban (XARELTO) 20 MG TABS tablet Take 1 tablet (20 mg total) by mouth daily. 90 tablet 0  . spironolactone (ALDACTONE) 25 MG tablet Take 1 tablet (25 mg total) by mouth daily. 30 tablet 6  . traMADol (ULTRAM) 50 MG tablet Take 1 tablet (50 mg total) by mouth every 6 (six) hours as needed for severe pain. 30 tablet 0   No current facility-administered medications for this visit.    Allergies:   Aspirin, Other, Penicillins, Codeine, Morphine and related, Sulfa antibiotics, Ether, Levaquin  [levofloxacin in d5w], Prednisone, and Procaine   Social History:  The patient  reports that she has quit smoking. Her smoking use included cigarettes. She has never used smokeless tobacco. She reports current alcohol use. She reports that she does not use drugs.   Family History:  The patient's family history includes Cancer in her brother and daughter; Dementia in her brother; Hypertension in her mother.  ROS:  Please see the history of present illness.    All other systems are reviewed and otherwise negative.   PHYSICAL EXAM:  VS:  There were no vitals taken for this visit. BMI: There is no height or weight on file to calculate BMI. Well nourished, well developed, in no acute distress HEENT: normocephalic, atraumatic Neck: no JVD, carotid bruits or masses Cardiac:  *** RRR; no significant murmurs, no rubs, or gallops Lungs:  *** CTA b/l, no wheezing, rhonchi or rales Abd: soft, nontender MS: no deformity or *** atrophy Ext: *** no edema Skin: warm and dry, no rash Neuro:  No gross deficits appreciated Psych: euthymic mood, full affect  *** PPM site is stable, no tethering or discomfort   EKG:  Done today and reviewed by myself shows  ***  Device interrogation done today and reviewed by myself:  ***   04/02/20: TTE Left Ventricle  Normal left ventricular size. There is apical hypertrophy. Systolic function is normal. EF: 60-65%. Doppler parameters are indeterminate for diastolic function.   Right Ventricle  Right ventricle is normal. Systolic function is normal. A pacing/ICD lead is noted in the right ventricle.   Left Atrium  Left atrium is severely dilated.   Right Atrium  Right atrium is mildly dilated.   IVC/SVC  The inferior vena cava demonstrates a diameter of <=2.1 cm and collapses >50%; therefore, the right atrial pressure is estimated at 3 mmHg.   Mitral Valve  The leaflets are mildly thickened. There is mild posterior annular calcification. There is trace  regurgitation.   Tricuspid Valve  Tricuspid valve structure is normal. There is mild regurgitation. The right ventricular systolic pressure is mildly elevated (37-49 mmHg).   Aortic Valve  There is mild sclerosis. There is no regurgitation.   Pulmonic Valve  The pulmonic valve was not well visualized.   Ascending Aorta  The aortic root is normal in size.   Pericardium  There is no pericardial effusion.    01/31/2018: TTE Study Conclusions  - Left ventricle: The cavity size was normal. Wall thickness was  increased in a pattern of moderate LVH. Systolic function was  normal. The estimated ejection fraction was in the range of 60%  to 65%. Wall motion was normal; there were no regional wall  motion abnormalities.  - Mitral valve: Severely calcified annulus.  - Left atrium: The atrium was severely dilated.  - Right atrium: The atrium was mildly dilated.  - Pulmonary arteries: Systolic pressure was mildly  increased. PA  peak pressure: 44 mm Hg (S).  - Pericardium, extracardiac: A small pericardial effusion was  identified.   Impressions:  - Normal LV systolic function; moderate LVH; biatrial enlargement;  trace TR with mild pulmonary hypertension; small pericardial  effusion.     Recent Labs: 12/31/2019: TSH 3.65 02/23/2020: ALT 12; Brain Natriuretic Peptide 215; BUN 31; Creat 1.33; Hemoglobin 14.1; Platelets 282; Potassium 3.2; Sodium 139  No results found for requested labs within last 8760 hours.   CrCl cannot be calculated (Patient's most recent lab result is older than the maximum 21 days allowed.).   Wt Readings from Last 3 Encounters:  12/30/18 203 lb (92.1 kg)  11/10/18 216 lb (98 kg)  06/19/18 213 lb (96.6 kg)     Other studies reviewed: Additional studies/records reviewed today include: summarized above  ASSESSMENT AND PLAN:  1. PPM     ***  2. Paroxysmal AFib     CHA2DS2Vasc is 5, on Eliquis, *** appropriately dosed     *** %  burden  3. HTN     ***  4. HLD     ***    Disposition: F/u with ***  Current medicines are reviewed at length with the patient today.  The patient did not have any concerns regarding medicines.  Norma Fredrickson, PA-C 04/28/2020 3:42 PM     [facility] [address] [place] [address] [phone] (office)  [phone] (fax)

## 2020-05-02 ENCOUNTER — Encounter: Payer: Medicare Other | Admitting: Physician Assistant

## 2020-05-02 DIAGNOSIS — I5033 Acute on chronic diastolic (congestive) heart failure: Secondary | ICD-10-CM | POA: Diagnosis not present

## 2020-05-02 DIAGNOSIS — I2510 Atherosclerotic heart disease of native coronary artery without angina pectoris: Secondary | ICD-10-CM | POA: Diagnosis not present

## 2020-05-02 DIAGNOSIS — E119 Type 2 diabetes mellitus without complications: Secondary | ICD-10-CM | POA: Diagnosis not present

## 2020-05-02 DIAGNOSIS — I4821 Permanent atrial fibrillation: Secondary | ICD-10-CM | POA: Diagnosis not present

## 2020-05-02 DIAGNOSIS — I110 Hypertensive heart disease with heart failure: Secondary | ICD-10-CM | POA: Diagnosis not present

## 2020-05-02 DIAGNOSIS — J45909 Unspecified asthma, uncomplicated: Secondary | ICD-10-CM | POA: Diagnosis not present

## 2020-05-05 DIAGNOSIS — J45909 Unspecified asthma, uncomplicated: Secondary | ICD-10-CM | POA: Diagnosis not present

## 2020-05-05 DIAGNOSIS — I4821 Permanent atrial fibrillation: Secondary | ICD-10-CM | POA: Diagnosis not present

## 2020-05-05 DIAGNOSIS — E119 Type 2 diabetes mellitus without complications: Secondary | ICD-10-CM | POA: Diagnosis not present

## 2020-05-05 DIAGNOSIS — I110 Hypertensive heart disease with heart failure: Secondary | ICD-10-CM | POA: Diagnosis not present

## 2020-05-05 DIAGNOSIS — I5033 Acute on chronic diastolic (congestive) heart failure: Secondary | ICD-10-CM | POA: Diagnosis not present

## 2020-05-05 DIAGNOSIS — I2510 Atherosclerotic heart disease of native coronary artery without angina pectoris: Secondary | ICD-10-CM | POA: Diagnosis not present

## 2020-05-08 DIAGNOSIS — I2510 Atherosclerotic heart disease of native coronary artery without angina pectoris: Secondary | ICD-10-CM | POA: Diagnosis not present

## 2020-05-08 DIAGNOSIS — I5033 Acute on chronic diastolic (congestive) heart failure: Secondary | ICD-10-CM | POA: Diagnosis not present

## 2020-05-08 DIAGNOSIS — J45909 Unspecified asthma, uncomplicated: Secondary | ICD-10-CM | POA: Diagnosis not present

## 2020-05-08 DIAGNOSIS — I110 Hypertensive heart disease with heart failure: Secondary | ICD-10-CM | POA: Diagnosis not present

## 2020-05-08 DIAGNOSIS — E119 Type 2 diabetes mellitus without complications: Secondary | ICD-10-CM | POA: Diagnosis not present

## 2020-05-08 DIAGNOSIS — I4821 Permanent atrial fibrillation: Secondary | ICD-10-CM | POA: Diagnosis not present

## 2020-05-10 DIAGNOSIS — I480 Paroxysmal atrial fibrillation: Secondary | ICD-10-CM | POA: Diagnosis not present

## 2020-05-10 DIAGNOSIS — G603 Idiopathic progressive neuropathy: Secondary | ICD-10-CM | POA: Diagnosis not present

## 2020-05-10 DIAGNOSIS — R600 Localized edema: Secondary | ICD-10-CM | POA: Diagnosis not present

## 2020-05-10 DIAGNOSIS — I4821 Permanent atrial fibrillation: Secondary | ICD-10-CM | POA: Diagnosis not present

## 2020-05-10 DIAGNOSIS — E119 Type 2 diabetes mellitus without complications: Secondary | ICD-10-CM | POA: Diagnosis not present

## 2020-05-10 DIAGNOSIS — B372 Candidiasis of skin and nail: Secondary | ICD-10-CM | POA: Diagnosis not present

## 2020-05-10 DIAGNOSIS — J45909 Unspecified asthma, uncomplicated: Secondary | ICD-10-CM | POA: Diagnosis not present

## 2020-05-10 DIAGNOSIS — I110 Hypertensive heart disease with heart failure: Secondary | ICD-10-CM | POA: Diagnosis not present

## 2020-05-10 DIAGNOSIS — I2510 Atherosclerotic heart disease of native coronary artery without angina pectoris: Secondary | ICD-10-CM | POA: Diagnosis not present

## 2020-05-10 DIAGNOSIS — I5033 Acute on chronic diastolic (congestive) heart failure: Secondary | ICD-10-CM | POA: Diagnosis not present

## 2020-05-11 DIAGNOSIS — I110 Hypertensive heart disease with heart failure: Secondary | ICD-10-CM | POA: Diagnosis not present

## 2020-05-11 DIAGNOSIS — I2510 Atherosclerotic heart disease of native coronary artery without angina pectoris: Secondary | ICD-10-CM | POA: Diagnosis not present

## 2020-05-11 DIAGNOSIS — E119 Type 2 diabetes mellitus without complications: Secondary | ICD-10-CM | POA: Diagnosis not present

## 2020-05-11 DIAGNOSIS — I4821 Permanent atrial fibrillation: Secondary | ICD-10-CM | POA: Diagnosis not present

## 2020-05-11 DIAGNOSIS — J45909 Unspecified asthma, uncomplicated: Secondary | ICD-10-CM | POA: Diagnosis not present

## 2020-05-11 DIAGNOSIS — I5033 Acute on chronic diastolic (congestive) heart failure: Secondary | ICD-10-CM | POA: Diagnosis not present

## 2020-05-15 DIAGNOSIS — J45909 Unspecified asthma, uncomplicated: Secondary | ICD-10-CM | POA: Diagnosis not present

## 2020-05-15 DIAGNOSIS — I5033 Acute on chronic diastolic (congestive) heart failure: Secondary | ICD-10-CM | POA: Diagnosis not present

## 2020-05-15 DIAGNOSIS — I2510 Atherosclerotic heart disease of native coronary artery without angina pectoris: Secondary | ICD-10-CM | POA: Diagnosis not present

## 2020-05-15 DIAGNOSIS — I4821 Permanent atrial fibrillation: Secondary | ICD-10-CM | POA: Diagnosis not present

## 2020-05-15 DIAGNOSIS — E119 Type 2 diabetes mellitus without complications: Secondary | ICD-10-CM | POA: Diagnosis not present

## 2020-05-15 DIAGNOSIS — I110 Hypertensive heart disease with heart failure: Secondary | ICD-10-CM | POA: Diagnosis not present

## 2020-05-16 DIAGNOSIS — I4821 Permanent atrial fibrillation: Secondary | ICD-10-CM | POA: Diagnosis not present

## 2020-05-16 DIAGNOSIS — I2510 Atherosclerotic heart disease of native coronary artery without angina pectoris: Secondary | ICD-10-CM | POA: Diagnosis not present

## 2020-05-16 DIAGNOSIS — Z79899 Other long term (current) drug therapy: Secondary | ICD-10-CM | POA: Diagnosis not present

## 2020-05-16 DIAGNOSIS — I110 Hypertensive heart disease with heart failure: Secondary | ICD-10-CM | POA: Diagnosis not present

## 2020-05-16 DIAGNOSIS — N390 Urinary tract infection, site not specified: Secondary | ICD-10-CM | POA: Diagnosis not present

## 2020-05-16 DIAGNOSIS — E119 Type 2 diabetes mellitus without complications: Secondary | ICD-10-CM | POA: Diagnosis not present

## 2020-05-16 DIAGNOSIS — I5033 Acute on chronic diastolic (congestive) heart failure: Secondary | ICD-10-CM | POA: Diagnosis not present

## 2020-05-16 DIAGNOSIS — J45909 Unspecified asthma, uncomplicated: Secondary | ICD-10-CM | POA: Diagnosis not present

## 2020-05-18 ENCOUNTER — Encounter: Payer: Medicare Other | Admitting: Physician Assistant

## 2020-05-18 DIAGNOSIS — I4821 Permanent atrial fibrillation: Secondary | ICD-10-CM | POA: Diagnosis not present

## 2020-05-18 DIAGNOSIS — I2510 Atherosclerotic heart disease of native coronary artery without angina pectoris: Secondary | ICD-10-CM | POA: Diagnosis not present

## 2020-05-18 DIAGNOSIS — G603 Idiopathic progressive neuropathy: Secondary | ICD-10-CM | POA: Diagnosis not present

## 2020-05-18 DIAGNOSIS — E119 Type 2 diabetes mellitus without complications: Secondary | ICD-10-CM | POA: Diagnosis not present

## 2020-05-18 DIAGNOSIS — I5033 Acute on chronic diastolic (congestive) heart failure: Secondary | ICD-10-CM | POA: Diagnosis not present

## 2020-05-18 DIAGNOSIS — I480 Paroxysmal atrial fibrillation: Secondary | ICD-10-CM | POA: Diagnosis not present

## 2020-05-18 DIAGNOSIS — J45909 Unspecified asthma, uncomplicated: Secondary | ICD-10-CM | POA: Diagnosis not present

## 2020-05-18 DIAGNOSIS — I110 Hypertensive heart disease with heart failure: Secondary | ICD-10-CM | POA: Diagnosis not present

## 2020-05-19 ENCOUNTER — Ambulatory Visit (INDEPENDENT_AMBULATORY_CARE_PROVIDER_SITE_OTHER): Payer: Medicare Other

## 2020-05-19 DIAGNOSIS — I495 Sick sinus syndrome: Secondary | ICD-10-CM

## 2020-05-19 LAB — CUP PACEART REMOTE DEVICE CHECK
Battery Remaining Longevity: 121 mo
Battery Remaining Percentage: 95.5 %
Battery Voltage: 3.02 V
Brady Statistic RV Percent Paced: 93 %
Date Time Interrogation Session: 20220408020012
Implantable Lead Implant Date: 20090521
Implantable Lead Implant Date: 20090521
Implantable Lead Location: 753859
Implantable Lead Location: 753860
Implantable Pulse Generator Implant Date: 20201002
Lead Channel Impedance Value: 380 Ohm
Lead Channel Pacing Threshold Amplitude: 1.5 V
Lead Channel Pacing Threshold Pulse Width: 0.7 ms
Lead Channel Sensing Intrinsic Amplitude: 7.3 mV
Lead Channel Setting Pacing Amplitude: 1.75 V
Lead Channel Setting Pacing Pulse Width: 0.7 ms
Lead Channel Setting Sensing Sensitivity: 2 mV
Pulse Gen Model: 2272
Pulse Gen Serial Number: 9159208

## 2020-05-23 ENCOUNTER — Encounter (HOSPITAL_BASED_OUTPATIENT_CLINIC_OR_DEPARTMENT_OTHER): Payer: Self-pay | Admitting: Emergency Medicine

## 2020-05-23 ENCOUNTER — Other Ambulatory Visit: Payer: Self-pay

## 2020-05-23 ENCOUNTER — Emergency Department (HOSPITAL_BASED_OUTPATIENT_CLINIC_OR_DEPARTMENT_OTHER)
Admission: EM | Admit: 2020-05-23 | Discharge: 2020-05-23 | Disposition: A | Discharge status: Home / Self Care | Payer: Medicare Other | Attending: Emergency Medicine | Admitting: Emergency Medicine

## 2020-05-23 ENCOUNTER — Emergency Department (HOSPITAL_BASED_OUTPATIENT_CLINIC_OR_DEPARTMENT_OTHER): Payer: Medicare Other

## 2020-05-23 DIAGNOSIS — Z79899 Other long term (current) drug therapy: Secondary | ICD-10-CM | POA: Insufficient documentation

## 2020-05-23 DIAGNOSIS — I110 Hypertensive heart disease with heart failure: Secondary | ICD-10-CM | POA: Diagnosis not present

## 2020-05-23 DIAGNOSIS — J45909 Unspecified asthma, uncomplicated: Secondary | ICD-10-CM | POA: Diagnosis not present

## 2020-05-23 DIAGNOSIS — E119 Type 2 diabetes mellitus without complications: Secondary | ICD-10-CM | POA: Diagnosis not present

## 2020-05-23 DIAGNOSIS — Z96653 Presence of artificial knee joint, bilateral: Secondary | ICD-10-CM | POA: Diagnosis not present

## 2020-05-23 DIAGNOSIS — R0902 Hypoxemia: Secondary | ICD-10-CM | POA: Diagnosis not present

## 2020-05-23 DIAGNOSIS — Z950 Presence of cardiac pacemaker: Secondary | ICD-10-CM | POA: Insufficient documentation

## 2020-05-23 DIAGNOSIS — Z7901 Long term (current) use of anticoagulants: Secondary | ICD-10-CM | POA: Insufficient documentation

## 2020-05-23 DIAGNOSIS — I517 Cardiomegaly: Secondary | ICD-10-CM | POA: Diagnosis not present

## 2020-05-23 DIAGNOSIS — R279 Unspecified lack of coordination: Secondary | ICD-10-CM | POA: Diagnosis not present

## 2020-05-23 DIAGNOSIS — I5033 Acute on chronic diastolic (congestive) heart failure: Secondary | ICD-10-CM | POA: Insufficient documentation

## 2020-05-23 DIAGNOSIS — I4821 Permanent atrial fibrillation: Secondary | ICD-10-CM | POA: Insufficient documentation

## 2020-05-23 DIAGNOSIS — I6782 Cerebral ischemia: Secondary | ICD-10-CM | POA: Diagnosis not present

## 2020-05-23 DIAGNOSIS — Z743 Need for continuous supervision: Secondary | ICD-10-CM | POA: Diagnosis not present

## 2020-05-23 DIAGNOSIS — E876 Hypokalemia: Secondary | ICD-10-CM | POA: Insufficient documentation

## 2020-05-23 DIAGNOSIS — Z87891 Personal history of nicotine dependence: Secondary | ICD-10-CM | POA: Diagnosis not present

## 2020-05-23 DIAGNOSIS — R4182 Altered mental status, unspecified: Secondary | ICD-10-CM | POA: Diagnosis present

## 2020-05-23 DIAGNOSIS — S2231XA Fracture of one rib, right side, initial encounter for closed fracture: Secondary | ICD-10-CM | POA: Diagnosis not present

## 2020-05-23 DIAGNOSIS — R600 Localized edema: Secondary | ICD-10-CM | POA: Insufficient documentation

## 2020-05-23 DIAGNOSIS — R5381 Other malaise: Secondary | ICD-10-CM | POA: Diagnosis not present

## 2020-05-23 DIAGNOSIS — I2510 Atherosclerotic heart disease of native coronary artery without angina pectoris: Secondary | ICD-10-CM | POA: Insufficient documentation

## 2020-05-23 LAB — CBC WITH DIFFERENTIAL/PLATELET
Abs Immature Granulocytes: 0.04 10*3/uL (ref 0.00–0.07)
Basophils Absolute: 0 10*3/uL (ref 0.0–0.1)
Basophils Relative: 1 %
Eosinophils Absolute: 0.3 10*3/uL (ref 0.0–0.5)
Eosinophils Relative: 3 %
HCT: 44.9 % (ref 36.0–46.0)
Hemoglobin: 14.5 g/dL (ref 12.0–15.0)
Immature Granulocytes: 1 %
Lymphocytes Relative: 13 %
Lymphs Abs: 1.1 10*3/uL (ref 0.7–4.0)
MCH: 30.3 pg (ref 26.0–34.0)
MCHC: 32.3 g/dL (ref 30.0–36.0)
MCV: 93.9 fL (ref 80.0–100.0)
Monocytes Absolute: 1 10*3/uL (ref 0.1–1.0)
Monocytes Relative: 12 %
Neutro Abs: 6.4 10*3/uL (ref 1.7–7.7)
Neutrophils Relative %: 70 %
Platelets: 237 10*3/uL (ref 150–400)
RBC: 4.78 MIL/uL (ref 3.87–5.11)
RDW: 17 % — ABNORMAL HIGH (ref 11.5–15.5)
WBC: 8.9 10*3/uL (ref 4.0–10.5)
nRBC: 0 % (ref 0.0–0.2)

## 2020-05-23 LAB — BASIC METABOLIC PANEL
Anion gap: 13 (ref 5–15)
Anion gap: 13 (ref 5–15)
BUN: 18 mg/dL (ref 8–23)
BUN: 18 mg/dL (ref 8–23)
CO2: 33 mmol/L — ABNORMAL HIGH (ref 22–32)
CO2: 32 mmol/L (ref 22–32)
Calcium: 9.1 mg/dL (ref 8.9–10.3)
Calcium: 9 mg/dL (ref 8.9–10.3)
Chloride: 91 mmol/L — ABNORMAL LOW (ref 98–111)
Chloride: 92 mmol/L — ABNORMAL LOW (ref 98–111)
Creatinine, Ser: 0.93 mg/dL (ref 0.44–1.00)
Creatinine, Ser: 0.87 mg/dL (ref 0.44–1.00)
GFR, Estimated: 59 mL/min — ABNORMAL LOW (ref >60)
GFR, Estimated: >60 mL/min (ref >60)
Glucose, Bld: 120 mg/dL — ABNORMAL HIGH (ref 70–99)
Glucose, Bld: 131 mg/dL — ABNORMAL HIGH (ref 70–99)
Potassium: 2.6 mmol/L — CL (ref 3.5–5.1)
Potassium: 2.5 mmol/L — CL (ref 3.5–5.1)
Sodium: 137 mmol/L (ref 135–145)
Sodium: 137 mmol/L (ref 135–145)

## 2020-05-23 LAB — URINALYSIS, ROUTINE W REFLEX MICROSCOPIC
Bilirubin Urine: NEGATIVE
Glucose, UA: NEGATIVE mg/dL
Hgb urine dipstick: NEGATIVE
Ketones, ur: NEGATIVE mg/dL
Leukocytes,Ua: NEGATIVE
Nitrite: NEGATIVE
Protein, ur: NEGATIVE mg/dL
Specific Gravity, Urine: 1.01 (ref 1.005–1.030)
pH: 7 (ref 5.0–8.0)

## 2020-05-23 LAB — MAGNESIUM: Magnesium: 1.8 mg/dL (ref 1.7–2.4)

## 2020-05-23 MED ORDER — POTASSIUM CHLORIDE 10 MEQ/100ML IV SOLN
10.0000 meq | INTRAVENOUS | Status: AC
  Administered 2020-05-23: 10 meq via INTRAVENOUS
  Filled 2020-05-23: qty 100

## 2020-05-23 MED ORDER — SODIUM CHLORIDE 0.9 % IV BOLUS: 500.0000 mL | Freq: Once | INTRAVENOUS | Status: DC

## 2020-05-23 MED ORDER — POTASSIUM CHLORIDE CRYS ER 20 MEQ PO TBCR
40.0000 meq | EXTENDED_RELEASE_TABLET | Freq: Once | ORAL | Status: AC
  Administered 2020-05-23: 40 meq via ORAL
  Filled 2020-05-23: qty 2

## 2020-05-23 MED ORDER — POTASSIUM CHLORIDE ER 10 MEQ PO TBCR: 10.0000 meq | EXTENDED_RELEASE_TABLET | Freq: Every day | ORAL | 0 refills | Status: DC

## 2020-05-23 NOTE — ED Notes (Signed)
Patient transported to CT 

## 2020-05-23 NOTE — ED Notes (Signed)
Pt removed her US guided IV at 2024. Another staff went in to restart and pt is now refusing. Provider notified.

## 2020-05-23 NOTE — ED Triage Notes (Signed)
Per EMS, pt had recent UTI and is having flank pain.  According to patient, she denies any pain and doesn't know why she is here.  Called [facility] [place] and spoke to [employer]  She relates that the patient has been becoming more lethargic and altered according to the family, she also noted some crackles in lung bases.  She also relates that she has lidocaine patch on right breast for recent shingles.  Noted pt has conjunctivitis.  Legs slightly swollen, purple in color.  No acute resp distress noted at this time.

## 2020-05-23 NOTE — Discharge Instructions (Signed)
Your laboratory work-up is notable for low potassium of 2.5.  Magnesium was within normal limits.  Potassium was replenished with 40 mEq K-Dur, but no IV replacement.  I prescribed 10 mEq K-Dur daily that I would like for her to take for the next 14 days.  As for her confusion and daytime drowsiness, perhaps related to her continued and poorly controlled postherpetic neuralgia pain symptoms which daughter reports has been keeping her up at night.    Her urinalysis was negative for infection, but cultures obtained and pending.  Chest x-ray without evidence of infection.  CT head was without any focal findings.  Neuro exam was benign and do not feel as though stroke work-up with MRI was warranted.  Remainder of physical exam also unremarkable and her vitals have been stable and within normal limits here in the ED.  Patient may require adjustment of her furosemide given that that is likely cause of her hypokalemia, particularly given lack of any reported diarrhea or vomiting.    Return to the ED or seek immediate medical attention should she experience any new or worsening symptoms.

## 2020-05-23 NOTE — ED Provider Notes (Signed)
[facility] EMERGENCY DEPARTMENT Provider Note   CSN: [id number] Arrival date & time: 05/23/20  1618     History Chief Complaint  Patient presents with  . Altered Mental Status    Katherine Cervantes is a 87 y.o. female with past medical history of HTN, HLD, CAD, and atrial fibrillation on Xarelto who presents the ED via EMS.  History was obtained by EMS reports that she has been having altered mental status after recently being treated for UTI with Macrobid.  Lidocaine patch over right side anterior chest wall due to herpes zoster.  I obtained history from daughter who reports that she recently moved to [facility] assisted living facility in March 2022.  She states that her mother has been particularly lethargic and confused over the course of the past couple of weeks and it has been getting progressively worse.  Today she was found to be sitting in a chair completely naked and was mildly difficult to arouse.  Last weekend she made a comment to her health staff at the assisted living facility she was pregnant.  UA was obtained and demonstrated evidence of infection and she was subsequently placed on antibiotics.  She asked me to call Katherine Cervantes and speak with Katherine Cervantes, the IT sales professional.  I called and Katherine Cervantes had already left for the day.  They transferred me to a medical tech, Katherine Cervantes, who states that she just seemed even more confused today than normal.  She was prescribed Macrobid for UTI.  On my examination, patient is a poor historian.  She is complaining only of right-sided anterior chest wall discomfort where she had the recent herpes zoster infection.  She has lidocaine patch placed over the area.  No vesicular lesions noted on my exam.  She does not appear to be in any acute distress and her vital signs are stable and within normal limits.  She cannot tell me why she is here.  She does appear to be confused.  Unclear baseline.  Level 5 caveat due to altered mental status.  HPI      Past Medical History:  Diagnosis Date  . Anticoagulation adequate 05/22/2018  . Asthma   . Atrial fibrillation ([place]) 09/27/2011  . CAD (coronary artery disease) 09/27/2011  . Diabetes mellitus without complication ([place])   . Diverticulosis 11/04/2011  . Dyslipidemia 09/27/2011  . History of vasculitis 09/27/2011  . Hypertension 09/27/2011  . Incontinence 10/15/2015  . Morbid obesity with BMI of 40.0-44.9, adult ([place]) 10/03/2016  . Osteoporosis 09/27/2011  . Pacemaker   . PUD (peptic ulcer disease)   . Rheumatoid arthritis(714.0) 09/27/2011  . Short-term memory loss 10/03/2016  . Sleep apnea 09/27/2011    Patient Active Problem List   Diagnosis Date Noted  . SOB (shortness of breath) on exertion 12/24/2019  . Depressed mood 12/22/2019  . Sinus node dysfunction ([place]) 11/10/2018  . Venous insufficiency of both lower extremities 02/05/2018  . Physical deconditioning 02/05/2018  . Acute on chronic diastolic CHF (congestive heart failure) ([place]) 01/30/2018  . Carpal tunnel syndrome, bilateral 12/09/2017  . Rheumatoid arthritis ([place]) 12/09/2017  . Non compliance w medication regimen 12/09/2017  . HLD (hyperlipidemia) 10/03/2016  . History of cardiac pacemaker 2/2 CHB 10/03/2016  . Morbid obesity with BMI of 40.0-44.9, adult ([place]) 10/03/2016  . Short-term memory loss 10/03/2016  . Pressure injury of skin 10/03/2016  . Incontinence 10/15/2015  . Bullous rash 08/28/2015  . Chronic anticoagulation 08/28/2015  . Cerebrovascular disease 08/24/2014  . Vasculitis ([place]) 04/12/2014  .  Neck mass 02/08/2013  . Hyperlipidemia 01/13/2013  . Type 2 diabetes mellitus ([place]) 11/30/2012  . Diverticulosis 11/04/2011  . History of (heterozygous) Hemochromatosis 10/10/2011  . Nephrolithiasis - 0.3cm Right Ureter August 2013 10/07/2011  . Shingles outbreak 10/01/2011  . Dyslipidemia 09/27/2011  . Essential hypertension 09/27/2011  . CAD (coronary artery disease) 09/27/2011  . Permanent atrial fibrillation  ([place]) 09/27/2011  . Sleep apnea 09/27/2011  . History of gout 09/27/2011  . Pacemaker 09/27/2011  . Rheumatoid arthritis(714.0) 09/27/2011  . Osteoporosis 09/27/2011    Past Surgical History:  Procedure Laterality Date  . ABDOMINAL HYSTERECTOMY    . ANKLE SURGERY    . APPENDECTOMY    . Carpal tunnel    . CHOLECYSTECTOMY    . PARTIAL GASTRECTOMY    . PPM GENERATOR CHANGEOUT N/A 11/13/2018   Procedure: PPM GENERATOR CHANGEOUT;  Surgeon: Evans Lance, MD;  Location: [facility];  Service: Cardiovascular;  Laterality: N/A;  . REPLACEMENT TOTAL KNEE Bilateral      OB History   No obstetric history on file.     Family History  Problem Relation Age of Onset  . Hypertension Mother   . Cancer Brother        colon  . Dementia Brother   . Cancer Daughter        colon,breast    Social History   Tobacco Use  . Smoking status: Former Smoker    Types: Cigarettes  . Smokeless tobacco: Never Used  . Tobacco comment: quit 4 months ago  Vaping Use  . Vaping Use: Never used  Substance Use Topics  . Alcohol use: Yes    Alcohol/week: 0.0 standard drinks    Comment: Occasional  . Drug use: No    Home Medications Prior to Admission medications   Medication Sig Start Date End Date Taking? Authorizing Provider  potassium chloride (KLOR-CON) 10 MEQ tablet Take 1 tablet (10 mEq total) by mouth daily for 14 days. 05/23/20 06/06/20 Yes Katherine Herter, PA-C  acetaminophen (TYLENOL) 325 MG tablet Take 2 tablets (650 mg total) by mouth every 4 (four) hours as needed for headache or mild pain. 05/22/18   Isaiah Serge, NP  albuterol (VENTOLIN HFA) 108 (90 Base) MCG/ACT inhaler Inhale 1-2 puffs into the lungs every 4 (four) hours as needed for wheezing or shortness of breath. 02/18/20   Orma Render, NP  amLODipine (NORVASC) 5 MG tablet Take 1 tablet (5 mg total) by mouth daily. 02/10/18   Leanor Kail, PA  furosemide (LASIX) 80 MG tablet Take 1 tablet (80 mg total) by mouth 2  (two) times daily. 02/14/20   Evans Lance, MD  gabapentin (NEURONTIN) 400 MG capsule Take 1 capsule (400 mg total) by mouth 3 (three) times daily. 04/24/20   Emeterio Reeve, DO  lidocaine-prilocaine (EMLA) cream Apply 1 application topically every 4 (four) hours as needed (shingles pain). 03/14/20   Emeterio Reeve, DO  metoprolol succinate (TOPROL-XL) 50 MG 24 hr tablet Take 1 tablet (50 mg total) by mouth daily. 03/08/20   Emeterio Reeve, DO  nitroGLYCERIN (NITROSTAT) 0.4 MG SL tablet Place 1 tablet (0.4 mg total) under the tongue every 5 (five) minutes as needed for chest pain (MAX 3 TABLETS). 12/24/17   Lendon Colonel, NP  potassium chloride SA (K-DUR,KLOR-CON) 20 MEQ tablet Take 1 tablet (20 mEq total) by mouth daily. 02/10/18   Leanor Kail, PA  rivaroxaban (XARELTO) 20 MG TABS tablet Take 1 tablet (20 mg total) by  mouth daily. 12/31/19 03/30/20  Emeterio Reeve, DO  spironolactone (ALDACTONE) 25 MG tablet Take 1 tablet (25 mg total) by mouth daily. 05/23/18   Isaiah Serge, NP  traMADol (ULTRAM) 50 MG tablet Take 1 tablet (50 mg total) by mouth every 6 (six) hours as needed for severe pain. 03/08/20   Emeterio Reeve, DO    Allergies    Aspirin, Other, Penicillins, Codeine, Levofloxacin in d5w, Morphine and related, Sulfa antibiotics, Ether, Prednisone, and Procaine  Review of Systems   Review of Systems  All other systems reviewed and are negative.   Physical Exam Updated Vital Signs BP (!) 120/52   Pulse (!) 58   Temp 98.3 F (36.8 C) (Oral)   Resp 18   SpO2 97%   Physical Exam Vitals and nursing note reviewed. Exam conducted with a chaperone present.  Constitutional:      General: She is not in acute distress.    Appearance: She is not toxic-appearing.  HENT:     Head: Normocephalic and atraumatic.  Eyes:     General: No scleral icterus.    Conjunctiva/sclera: Conjunctivae normal.  Cardiovascular:     Rate and Rhythm: Normal rate.      Pulses: Normal pulses.  Pulmonary:     Effort: Pulmonary effort is normal. No respiratory distress.  Musculoskeletal:     Cervical back: Normal range of motion. No rigidity.     Right lower leg: Edema present.     Left lower leg: Edema present.     Comments: Venous stasis dermatitis over bilateral lower extremities.  Peripheral pulses intact.  Skin:    General: Skin is dry.  Neurological:     Mental Status: She is alert. She is disoriented.     GCS: GCS eye subscore is 4. GCS verbal subscore is 5. GCS motor subscore is 6.  Psychiatric:        Mood and Affect: Mood normal.        Behavior: Behavior normal.        Thought Content: Thought content normal.     ED Results / Procedures / Treatments   Labs (all labs ordered are listed, but only abnormal results are displayed) Labs Reviewed  CBC WITH DIFFERENTIAL/PLATELET - Abnormal; Notable for the following components:      Result Value   RDW 17.0 (*)    All other components within normal limits  BASIC METABOLIC PANEL - Abnormal; Notable for the following components:   Potassium 2.6 (*)    Chloride 91 (*)    CO2 33 (*)    Glucose, Bld 120 (*)    GFR, Estimated 59 (*)    All other components within normal limits  BASIC METABOLIC PANEL - Abnormal; Notable for the following components:   Potassium 2.5 (*)    Chloride 92 (*)    Glucose, Bld 131 (*)    All other components within normal limits  URINE CULTURE  URINALYSIS, ROUTINE W REFLEX MICROSCOPIC  MAGNESIUM    EKG EKG Interpretation  Date/Time:  Tuesday May 23 2020 20:29:11 EDT Ventricular Rate:  60 PR Interval:  59 QRS Duration: 198 QT Interval:  532 QTC Calculation: 532 R Axis:   -85 Text Interpretation: Sinus rhythm Short PR interval IVCD, consider atypical RBBB VENTRICULAR PACED RHYTHM Confirmed by Katherine Cervantes [phone]) on 05/23/2020 8:31:38 PM   Radiology CT Head Wo Contrast  Result Date: 05/23/2020 CLINICAL DATA:  Mental status change. EXAM: CT HEAD WITHOUT  CONTRAST TECHNIQUE: Contiguous axial images were  obtained from the base of the skull through the vertex without intravenous contrast. COMPARISON:  None. FINDINGS: Brain: There is no evidence of an acute cortically based infarct, intracranial hemorrhage, mass, midline shift, or extra-axial fluid collection. A lacunar infarct in the right caudate nucleus is chronic in appearance. Hypodensities in the cerebral white matter bilaterally are nonspecific but compatible with mild chronic small vessel ischemic disease. There is mild generalized cerebral atrophy. A nonenlarged partially empty sella is noted. Vascular: Calcified atherosclerosis at the skull base. No hyperdense vessel. Skull: No fracture.  Scattered subcentimeter lucent skull lesions. Sinuses/Orbits: Paranasal sinuses and mastoid air cells are clear. Bilateral cataract extraction. Other: None. IMPRESSION: 1. No evidence of acute intracranial abnormality. 2. Mild chronic small vessel ischemic disease. 3. Scattered subcentimeter lucent skull lesions, nonspecific. Consider evaluation for multiple myeloma if clinically desired Electronically Signed   By: Logan Bores M.D.   On: 05/23/2020 18:10   DG Chest Portable 1 View  Result Date: 05/23/2020 CLINICAL DATA:  Altered mental status.  Lethargy. EXAM: PORTABLE CHEST 1 VIEW COMPARISON:  10/27/2017 FINDINGS: Remote right rib fractures. Pacer with leads at right atrium and right ventricle. No lead discontinuity. Midline trachea. Cardiomegaly accentuated by AP portable technique. Atherosclerosis in the transverse aorta. No right-sided pleural effusion. No pneumothorax. Inferior left hemithorax and left pleural space suboptimally evaluated secondary to patient's size and AP portable technique. No congestive failure. Mild right base atelectasis or scarring. IMPRESSION: Suboptimal evaluation of the inferior left hemithorax, secondary to patient body habitus and AP portable technique. Otherwise, no acute process or  explanation for altered mental status. Cardiomegaly without congestive failure. Aortic Atherosclerosis (ICD10-I70.0). Electronically Signed   By: Abigail Miyamoto M.D.   On: 05/23/2020 17:38    Procedures .Critical Care Performed by: Katherine Herter, PA-C Authorized by: Katherine Herter, PA-C   Critical care provider statement:    Critical care time (minutes):  45   Critical care was necessary to treat or prevent imminent or life-threatening deterioration of the following conditions:  Metabolic crisis   Critical care was time spent personally by me on the following activities:  Discussions with consultants, evaluation of patient's response to treatment, examination of patient, ordering and performing treatments and interventions, ordering and review of laboratory studies, ordering and review of radiographic studies, pulse oximetry, re-evaluation of patient's condition, obtaining history from patient or surrogate and review of old charts     Medications Ordered in ED Medications  potassium chloride 10 mEq in 100 mL IVPB (10 mEq Intravenous Not Given 05/23/20 2122)  sodium chloride 0.9 % bolus 500 mL (500 mLs Intravenous Not Given 05/23/20 2122)  potassium chloride SA (KLOR-CON) CR tablet 40 mEq (40 mEq Oral Given 05/23/20 2002)    ED Course  I have reviewed the triage vital signs and the nursing notes.  Pertinent labs & imaging results that were available during my care of the patient were reviewed by me and considered in my medical decision making (see chart for details).    MDM Rules/Calculators/A&P                          Katherine Cervantes was evaluated in Emergency Department on 05/23/2020 for the symptoms described in the history of present illness. She was evaluated in the context of the global COVID-19 pandemic, which necessitated consideration that the patient might be at risk for infection with the SARS-CoV-2 virus that causes COVID-19. Institutional protocols and algorithms that pertain  to the evaluation of patients at risk for COVID-19 are in a state of rapid change based on information released by regulatory bodies including the CDC and federal and state organizations. These policies and algorithms were followed during the patient's care in the ED.  I personally reviewed patient's medical chart and all notes from triage and staff during today's encounter. I have also ordered and reviewed all labs and imaging that I felt to be medically necessary in the evaluation of this patient's complaints and with consideration of their physical exam. If needed, translation services were available and utilized.   Daughter informed her that she actually was diagnosed with herpes zoster a couple of months ago and her vesicular lesions have long since resolved.  However, she has ongoing pain over the right anterior chest wall as well as right posterior chest wall.  This is likely the "flank pain" that was noted in triage.  Her symptoms are likely postherpetic neuralgia.  She is going to have her gabapentin increased.  This is already being closely followed.  She has left-sided conjunctivitis, but daughter and patient states that she is getting drops.  It is being managed.  Initial potassium 2.6, but RN tells me that concern for derangement due to hemolysis.  Will place IV and repeat BMP.  Magnesium obtained and normal at 1.8.  She does take Lasix daily which could be contributing to her hypokalemia.  Patient did not get the full IV potassium replacement as her IV was pulled out, but is expressing that she does not want it placed back in and would like to go home.  I spoke with the daughter as well as the Media planner at the facility, Ms. Polita, who feel comfortable with her discharge back home to their facility.  They will follow up with her primary care provider in the morning.  They understand that her work-up was largely reassuring aside from hypokalemia and that she simply needs to have  her furosemide adjusted and will need to start taking p.o. potassium supplements.  They will follow urine culture.  Continue to monitor for changes in behavior.  ED return precautions discussed.  I offered hospital admission for observation, IV hydration, and potassium replacement, however daughter declined and feels comfortable with 24/7 care at the facility.  Daughter suspects that patient simply is not sleeping well due to her chronic pain from postherpetic neuralgia.  She states that she hardly ever leaves her room which she believes is contributing to her drowsiness during the day and confusion about dates.  Discussed case with Dr. Ronnald Nian who evaluated patient and agrees with assessment and plan.   Final Clinical Impression(s) / ED Diagnoses Final diagnoses:  Hypokalemia    Rx / DC Orders ED Discharge Orders         Ordered    potassium chloride (KLOR-CON) 10 MEQ tablet  Daily        05/23/20 2137           Katherine Herter, PA-C 05/23/20 2140    Katherine Sites, DO 05/23/20 2219

## 2020-05-25 DIAGNOSIS — I5033 Acute on chronic diastolic (congestive) heart failure: Secondary | ICD-10-CM | POA: Diagnosis not present

## 2020-05-25 DIAGNOSIS — J45909 Unspecified asthma, uncomplicated: Secondary | ICD-10-CM | POA: Diagnosis not present

## 2020-05-25 DIAGNOSIS — E119 Type 2 diabetes mellitus without complications: Secondary | ICD-10-CM | POA: Diagnosis not present

## 2020-05-25 DIAGNOSIS — I2510 Atherosclerotic heart disease of native coronary artery without angina pectoris: Secondary | ICD-10-CM | POA: Diagnosis not present

## 2020-05-25 DIAGNOSIS — I4821 Permanent atrial fibrillation: Secondary | ICD-10-CM | POA: Diagnosis not present

## 2020-05-25 DIAGNOSIS — I110 Hypertensive heart disease with heart failure: Secondary | ICD-10-CM | POA: Diagnosis not present

## 2020-05-25 LAB — URINE CULTURE: Culture: NO GROWTH

## 2020-05-26 DIAGNOSIS — I5033 Acute on chronic diastolic (congestive) heart failure: Secondary | ICD-10-CM | POA: Diagnosis not present

## 2020-05-26 DIAGNOSIS — I2510 Atherosclerotic heart disease of native coronary artery without angina pectoris: Secondary | ICD-10-CM | POA: Diagnosis not present

## 2020-05-26 DIAGNOSIS — I110 Hypertensive heart disease with heart failure: Secondary | ICD-10-CM | POA: Diagnosis not present

## 2020-05-26 DIAGNOSIS — I4821 Permanent atrial fibrillation: Secondary | ICD-10-CM | POA: Diagnosis not present

## 2020-05-26 DIAGNOSIS — J45909 Unspecified asthma, uncomplicated: Secondary | ICD-10-CM | POA: Diagnosis not present

## 2020-05-26 DIAGNOSIS — E119 Type 2 diabetes mellitus without complications: Secondary | ICD-10-CM | POA: Diagnosis not present

## 2020-05-30 DIAGNOSIS — I2510 Atherosclerotic heart disease of native coronary artery without angina pectoris: Secondary | ICD-10-CM | POA: Diagnosis not present

## 2020-05-30 DIAGNOSIS — I110 Hypertensive heart disease with heart failure: Secondary | ICD-10-CM | POA: Diagnosis not present

## 2020-05-30 DIAGNOSIS — J45909 Unspecified asthma, uncomplicated: Secondary | ICD-10-CM | POA: Diagnosis not present

## 2020-05-30 DIAGNOSIS — E119 Type 2 diabetes mellitus without complications: Secondary | ICD-10-CM | POA: Diagnosis not present

## 2020-05-30 DIAGNOSIS — I5033 Acute on chronic diastolic (congestive) heart failure: Secondary | ICD-10-CM | POA: Diagnosis not present

## 2020-05-30 DIAGNOSIS — I4821 Permanent atrial fibrillation: Secondary | ICD-10-CM | POA: Diagnosis not present

## 2020-06-01 NOTE — Progress Notes (Signed)
Remote pacemaker transmission.   

## 2020-06-02 DIAGNOSIS — E119 Type 2 diabetes mellitus without complications: Secondary | ICD-10-CM | POA: Diagnosis not present

## 2020-06-02 DIAGNOSIS — I110 Hypertensive heart disease with heart failure: Secondary | ICD-10-CM | POA: Diagnosis not present

## 2020-06-02 DIAGNOSIS — I4821 Permanent atrial fibrillation: Secondary | ICD-10-CM | POA: Diagnosis not present

## 2020-06-02 DIAGNOSIS — J45909 Unspecified asthma, uncomplicated: Secondary | ICD-10-CM | POA: Diagnosis not present

## 2020-06-02 DIAGNOSIS — I2510 Atherosclerotic heart disease of native coronary artery without angina pectoris: Secondary | ICD-10-CM | POA: Diagnosis not present

## 2020-06-02 DIAGNOSIS — I5033 Acute on chronic diastolic (congestive) heart failure: Secondary | ICD-10-CM | POA: Diagnosis not present

## 2020-06-05 DIAGNOSIS — I110 Hypertensive heart disease with heart failure: Secondary | ICD-10-CM | POA: Diagnosis not present

## 2020-06-05 DIAGNOSIS — I5033 Acute on chronic diastolic (congestive) heart failure: Secondary | ICD-10-CM | POA: Diagnosis not present

## 2020-06-05 DIAGNOSIS — I2510 Atherosclerotic heart disease of native coronary artery without angina pectoris: Secondary | ICD-10-CM | POA: Diagnosis not present

## 2020-06-06 DIAGNOSIS — I2510 Atherosclerotic heart disease of native coronary artery without angina pectoris: Secondary | ICD-10-CM | POA: Diagnosis not present

## 2020-06-06 DIAGNOSIS — I110 Hypertensive heart disease with heart failure: Secondary | ICD-10-CM | POA: Diagnosis not present

## 2020-06-06 DIAGNOSIS — E119 Type 2 diabetes mellitus without complications: Secondary | ICD-10-CM | POA: Diagnosis not present

## 2020-06-06 DIAGNOSIS — I5033 Acute on chronic diastolic (congestive) heart failure: Secondary | ICD-10-CM | POA: Diagnosis not present

## 2020-06-06 DIAGNOSIS — J45909 Unspecified asthma, uncomplicated: Secondary | ICD-10-CM | POA: Diagnosis not present

## 2020-06-06 DIAGNOSIS — I4821 Permanent atrial fibrillation: Secondary | ICD-10-CM | POA: Diagnosis not present

## 2020-06-07 DIAGNOSIS — I2510 Atherosclerotic heart disease of native coronary artery without angina pectoris: Secondary | ICD-10-CM | POA: Diagnosis not present

## 2020-06-07 DIAGNOSIS — I4891 Unspecified atrial fibrillation: Secondary | ICD-10-CM | POA: Diagnosis not present

## 2020-06-07 DIAGNOSIS — R0789 Other chest pain: Secondary | ICD-10-CM | POA: Diagnosis not present

## 2020-06-07 DIAGNOSIS — Z79899 Other long term (current) drug therapy: Secondary | ICD-10-CM | POA: Diagnosis not present

## 2020-06-07 DIAGNOSIS — I4510 Unspecified right bundle-branch block: Secondary | ICD-10-CM | POA: Diagnosis not present

## 2020-06-07 DIAGNOSIS — E785 Hyperlipidemia, unspecified: Secondary | ICD-10-CM | POA: Diagnosis not present

## 2020-06-07 DIAGNOSIS — R9431 Abnormal electrocardiogram [ECG] [EKG]: Secondary | ICD-10-CM | POA: Diagnosis not present

## 2020-06-07 DIAGNOSIS — R404 Transient alteration of awareness: Secondary | ICD-10-CM | POA: Diagnosis not present

## 2020-06-07 DIAGNOSIS — R7989 Other specified abnormal findings of blood chemistry: Secondary | ICD-10-CM | POA: Diagnosis not present

## 2020-06-07 DIAGNOSIS — G4733 Obstructive sleep apnea (adult) (pediatric): Secondary | ICD-10-CM | POA: Diagnosis not present

## 2020-06-07 DIAGNOSIS — R079 Chest pain, unspecified: Secondary | ICD-10-CM | POA: Diagnosis not present

## 2020-06-07 DIAGNOSIS — I10 Essential (primary) hypertension: Secondary | ICD-10-CM | POA: Diagnosis not present

## 2020-06-07 DIAGNOSIS — Z7901 Long term (current) use of anticoagulants: Secondary | ICD-10-CM | POA: Diagnosis not present

## 2020-06-07 DIAGNOSIS — I491 Atrial premature depolarization: Secondary | ICD-10-CM | POA: Diagnosis not present

## 2020-06-08 ENCOUNTER — Ambulatory Visit: Payer: Medicare Other | Admitting: Osteopathic Medicine

## 2020-06-13 DIAGNOSIS — E876 Hypokalemia: Secondary | ICD-10-CM | POA: Diagnosis not present

## 2020-06-14 DIAGNOSIS — I2510 Atherosclerotic heart disease of native coronary artery without angina pectoris: Secondary | ICD-10-CM | POA: Diagnosis not present

## 2020-06-14 DIAGNOSIS — R079 Chest pain, unspecified: Secondary | ICD-10-CM | POA: Diagnosis not present

## 2020-06-14 DIAGNOSIS — I110 Hypertensive heart disease with heart failure: Secondary | ICD-10-CM | POA: Diagnosis not present

## 2020-06-14 DIAGNOSIS — I5033 Acute on chronic diastolic (congestive) heart failure: Secondary | ICD-10-CM | POA: Diagnosis not present

## 2020-06-14 DIAGNOSIS — I4821 Permanent atrial fibrillation: Secondary | ICD-10-CM | POA: Diagnosis not present

## 2020-06-14 DIAGNOSIS — E1142 Type 2 diabetes mellitus with diabetic polyneuropathy: Secondary | ICD-10-CM | POA: Diagnosis not present

## 2020-06-14 DIAGNOSIS — J45909 Unspecified asthma, uncomplicated: Secondary | ICD-10-CM | POA: Diagnosis not present

## 2020-06-21 DIAGNOSIS — I4821 Permanent atrial fibrillation: Secondary | ICD-10-CM | POA: Diagnosis not present

## 2020-06-21 DIAGNOSIS — J45909 Unspecified asthma, uncomplicated: Secondary | ICD-10-CM | POA: Diagnosis not present

## 2020-06-21 DIAGNOSIS — I110 Hypertensive heart disease with heart failure: Secondary | ICD-10-CM | POA: Diagnosis not present

## 2020-06-21 DIAGNOSIS — E1142 Type 2 diabetes mellitus with diabetic polyneuropathy: Secondary | ICD-10-CM | POA: Diagnosis not present

## 2020-06-21 DIAGNOSIS — I2510 Atherosclerotic heart disease of native coronary artery without angina pectoris: Secondary | ICD-10-CM | POA: Diagnosis not present

## 2020-06-21 DIAGNOSIS — I5033 Acute on chronic diastolic (congestive) heart failure: Secondary | ICD-10-CM | POA: Diagnosis not present

## 2020-06-22 DIAGNOSIS — I5033 Acute on chronic diastolic (congestive) heart failure: Secondary | ICD-10-CM | POA: Diagnosis not present

## 2020-06-22 DIAGNOSIS — E1142 Type 2 diabetes mellitus with diabetic polyneuropathy: Secondary | ICD-10-CM | POA: Diagnosis not present

## 2020-06-22 DIAGNOSIS — J45909 Unspecified asthma, uncomplicated: Secondary | ICD-10-CM | POA: Diagnosis not present

## 2020-06-22 DIAGNOSIS — I4821 Permanent atrial fibrillation: Secondary | ICD-10-CM | POA: Diagnosis not present

## 2020-06-22 DIAGNOSIS — I110 Hypertensive heart disease with heart failure: Secondary | ICD-10-CM | POA: Diagnosis not present

## 2020-06-22 DIAGNOSIS — I2510 Atherosclerotic heart disease of native coronary artery without angina pectoris: Secondary | ICD-10-CM | POA: Diagnosis not present

## 2020-06-27 DIAGNOSIS — I110 Hypertensive heart disease with heart failure: Secondary | ICD-10-CM | POA: Diagnosis not present

## 2020-06-27 DIAGNOSIS — J45909 Unspecified asthma, uncomplicated: Secondary | ICD-10-CM | POA: Diagnosis not present

## 2020-06-27 DIAGNOSIS — E1142 Type 2 diabetes mellitus with diabetic polyneuropathy: Secondary | ICD-10-CM | POA: Diagnosis not present

## 2020-06-27 DIAGNOSIS — I5033 Acute on chronic diastolic (congestive) heart failure: Secondary | ICD-10-CM | POA: Diagnosis not present

## 2020-06-27 DIAGNOSIS — I4821 Permanent atrial fibrillation: Secondary | ICD-10-CM | POA: Diagnosis not present

## 2020-06-27 DIAGNOSIS — I2510 Atherosclerotic heart disease of native coronary artery without angina pectoris: Secondary | ICD-10-CM | POA: Diagnosis not present

## 2020-06-28 DIAGNOSIS — I480 Paroxysmal atrial fibrillation: Secondary | ICD-10-CM | POA: Diagnosis not present

## 2020-06-28 DIAGNOSIS — G603 Idiopathic progressive neuropathy: Secondary | ICD-10-CM | POA: Diagnosis not present

## 2020-06-28 DIAGNOSIS — I10 Essential (primary) hypertension: Secondary | ICD-10-CM | POA: Diagnosis not present

## 2020-06-28 DIAGNOSIS — R600 Localized edema: Secondary | ICD-10-CM | POA: Diagnosis not present

## 2020-06-28 DIAGNOSIS — R079 Chest pain, unspecified: Secondary | ICD-10-CM | POA: Diagnosis not present

## 2020-06-30 ENCOUNTER — Telehealth: Payer: Self-pay | Admitting: Cardiology

## 2020-06-30 NOTE — Telephone Encounter (Signed)
New Message.:       Kelli Hope, NP would like for Dr [name] to please review EKG and Medicine list she is faxing over nowc. She would like a call after he reviewed these please.Marland Kitchen

## 2020-06-30 NOTE — Telephone Encounter (Signed)
EKG reviewed and dr [name] discussed with courtney freeman np. Patient has a follow up appt with dr Duke Salvia on 07-07-20. EKG given to chart prep.

## 2020-06-30 NOTE — Progress Notes (Signed)
Updated Medication list to match [facility] from [facility]

## 2020-07-03 DIAGNOSIS — J45909 Unspecified asthma, uncomplicated: Secondary | ICD-10-CM | POA: Diagnosis not present

## 2020-07-03 DIAGNOSIS — I110 Hypertensive heart disease with heart failure: Secondary | ICD-10-CM | POA: Diagnosis not present

## 2020-07-03 DIAGNOSIS — I5033 Acute on chronic diastolic (congestive) heart failure: Secondary | ICD-10-CM | POA: Diagnosis not present

## 2020-07-03 DIAGNOSIS — I4821 Permanent atrial fibrillation: Secondary | ICD-10-CM | POA: Diagnosis not present

## 2020-07-03 DIAGNOSIS — I2510 Atherosclerotic heart disease of native coronary artery without angina pectoris: Secondary | ICD-10-CM | POA: Diagnosis not present

## 2020-07-03 DIAGNOSIS — E1142 Type 2 diabetes mellitus with diabetic polyneuropathy: Secondary | ICD-10-CM | POA: Diagnosis not present

## 2020-07-04 DIAGNOSIS — I2510 Atherosclerotic heart disease of native coronary artery without angina pectoris: Secondary | ICD-10-CM | POA: Diagnosis not present

## 2020-07-04 DIAGNOSIS — I5033 Acute on chronic diastolic (congestive) heart failure: Secondary | ICD-10-CM | POA: Diagnosis not present

## 2020-07-04 DIAGNOSIS — I110 Hypertensive heart disease with heart failure: Secondary | ICD-10-CM | POA: Diagnosis not present

## 2020-07-05 DIAGNOSIS — I110 Hypertensive heart disease with heart failure: Secondary | ICD-10-CM | POA: Diagnosis not present

## 2020-07-05 DIAGNOSIS — I5033 Acute on chronic diastolic (congestive) heart failure: Secondary | ICD-10-CM | POA: Diagnosis not present

## 2020-07-05 DIAGNOSIS — J45909 Unspecified asthma, uncomplicated: Secondary | ICD-10-CM | POA: Diagnosis not present

## 2020-07-05 DIAGNOSIS — E1142 Type 2 diabetes mellitus with diabetic polyneuropathy: Secondary | ICD-10-CM | POA: Diagnosis not present

## 2020-07-05 DIAGNOSIS — I2510 Atherosclerotic heart disease of native coronary artery without angina pectoris: Secondary | ICD-10-CM | POA: Diagnosis not present

## 2020-07-05 DIAGNOSIS — I4821 Permanent atrial fibrillation: Secondary | ICD-10-CM | POA: Diagnosis not present

## 2020-07-07 ENCOUNTER — Ambulatory Visit (INDEPENDENT_AMBULATORY_CARE_PROVIDER_SITE_OTHER): Payer: Medicare Other | Admitting: Cardiovascular Disease

## 2020-07-07 ENCOUNTER — Encounter: Payer: Self-pay | Admitting: Cardiovascular Disease

## 2020-07-07 ENCOUNTER — Other Ambulatory Visit: Payer: Self-pay

## 2020-07-07 VITALS — BP 136/73 | HR 59 | Ht 60.0 in | Wt 213.8 lb

## 2020-07-07 DIAGNOSIS — R079 Chest pain, unspecified: Secondary | ICD-10-CM

## 2020-07-07 DIAGNOSIS — I2583 Coronary atherosclerosis due to lipid rich plaque: Secondary | ICD-10-CM | POA: Diagnosis not present

## 2020-07-07 DIAGNOSIS — I2510 Atherosclerotic heart disease of native coronary artery without angina pectoris: Secondary | ICD-10-CM

## 2020-07-07 DIAGNOSIS — I872 Venous insufficiency (chronic) (peripheral): Secondary | ICD-10-CM | POA: Diagnosis not present

## 2020-07-07 DIAGNOSIS — I5033 Acute on chronic diastolic (congestive) heart failure: Secondary | ICD-10-CM | POA: Diagnosis not present

## 2020-07-07 DIAGNOSIS — I4821 Permanent atrial fibrillation: Secondary | ICD-10-CM

## 2020-07-07 NOTE — Assessment & Plan Note (Signed)
She is ventricularly paced at 60 beats a minute.  Continue Eliquis.

## 2020-07-07 NOTE — Progress Notes (Signed)
Cardiology Office Note:    Evaluation Performed:  Follow-up visit  Date:  07/07/2020   ID:  Katherine Cervantes, Katherine Cervantes [birth year], MRN [id number]  PCP:  Sunnie Nielsen, DO  Cardiologist:  Olga Millers, MD   Electrophysiologist:  Dr. Ladona Ridgel   Chief Complaint:  Edema, shortness of breath  History of Present Illness:    Katherine Cervantes is a 87 y.o. female with chronic diastolic heart failure, hypertension, carotid artery disease, persistent atrial fibrillation, status post [facility] pacemaker, and noncompliance here for follow-up.  She was admitted 01/2018 with acute on chronic diastolic heart failure in the setting of noncompliance.  Prior to hospitalization she stopped taking all of her medications.  She required diuresis with IV Lasix and diuresed 15 L.  She was subsequently discharged to a skilled nursing facility.  During that hospitalization her heart rates were well-controlled but she remained in atrial fibrillation.  She was started on amlodipine for poorly controlled hypertension.  She followed up with Katherine Shelter, PA-CM 02/2018.  It was noted at that time that she had a lot of social stressors and felt as though she just did not want to go on living.  From a cardiac standpoint she was stable.  Ms. Heffler was discharged from the rehab 03/2018.  After being home she has felt like giving up.  She stopped taking her medication for over a month.  Her lasix was last filled 03/10/18.  Her prescription bottle was for 20 mg of Lasix.  She also had lower extremity similar cellulitis.  She was referred to the hospital where she was diuresed 13 L.  Her discharge weight was 105.6 kg down from 117.1 kg on admission.  She was discharged on Lasix 80 mg daily and went to a SNF. She was seen in the ED 05/2020 with UTI and altered mental status. Her pacemaker was last interrogated 05/2020 and was stable.  Today, she is accompanied by her daughter Katherine Cervantes, who also provides some history. She is feeling good overall  but congested due to the weather. Currently at this visit she has central and right chest pain that has been intermittently constant since February. She recently had shingles in her central chest that has resolved, but the pain lingers. Of note, in clinic today she had a "catching" sharp pain in her chest that is likely residual pain due to shingles. The pain mostly flares up when she is getting up and moving around. She is able to walk a little with the aid of a walker. Her breathing is not as good as she'd like. For exercise, she does some walking to the dining room, common area from her bedroom, and is typically more short of breath. Her activity has been limited significantly due to shingles and health issues. Due to shingles she was restricted from swimming at the [facility], but hope to return with my affirmation that she is not contagious. She has some LE edema but not as much as before. She denies any palpitations, headaches, lightheadedness, or syncope, orthopnea or PND.    Prior CV studies:   The following studies were reviewed today:  Echo 01/31/18 Study Conclusions  - Left ventricle: The cavity size was normal. Wall thickness was increased in a pattern of moderate LVH. Systolic function was normal. The estimated ejection fraction was in the range of 60% to 65%. Wall motion was normal; there were no regional wall motion abnormalities. - Mitral valve: Severely calcified annulus. - Left atrium: The atrium was severely  dilated. - Right atrium: The atrium was mildly dilated. - Pulmonary arteries: Systolic pressure was mildly increased. PA peak pressure: 44 mm Hg (S). - Pericardium, extracardiac: A small pericardial effusion was identified.  Impressions:  - Normal LV systolic function; moderate LVH; biatrial enlargement; trace TR with mild pulmonary hypertension; small pericardial effusion.  Past Medical History:  Diagnosis Date  . Anticoagulation adequate 05/22/2018   . Asthma   . Atrial fibrillation (HCC) 09/27/2011  . CAD (coronary artery disease) 09/27/2011  . Diabetes mellitus without complication (HCC)   . Diverticulosis 11/04/2011  . Dyslipidemia 09/27/2011  . History of vasculitis 09/27/2011  . Hypertension 09/27/2011  . Incontinence 10/15/2015  . Morbid obesity with BMI of 40.0-44.9, adult (HCC) 10/03/2016  . Osteoporosis 09/27/2011  . Pacemaker   . PUD (peptic ulcer disease)   . Rheumatoid arthritis(714.0) 09/27/2011  . Short-term memory loss 10/03/2016  . Sleep apnea 09/27/2011   Past Surgical History:  Procedure Laterality Date  . ABDOMINAL HYSTERECTOMY    . ANKLE SURGERY    . APPENDECTOMY    . Carpal tunnel    . CHOLECYSTECTOMY    . PARTIAL GASTRECTOMY    . PPM GENERATOR CHANGEOUT N/A 11/13/2018   Procedure: PPM GENERATOR CHANGEOUT;  Surgeon: Marinus Maw, MD;  Location: [facility] INVASIVE CV LAB;  Service: Cardiovascular;  Laterality: N/A;  . REPLACEMENT TOTAL KNEE Bilateral      Current Meds  Medication Sig  . acetaminophen (TYLENOL) 325 MG tablet Take 2 tablets (650 mg total) by mouth every 4 (four) hours as needed for headache or mild pain.  Marland Kitchen albuterol (VENTOLIN HFA) 108 (90 Base) MCG/ACT inhaler Inhale 1-2 puffs into the lungs every 4 (four) hours as needed for wheezing or shortness of breath.  . clotrimazole (LOTRIMIN) 1 % cream Apply topically as needed. Apply topically to groin area, breast folds and inner thigh 2 times a day for yeast  . fluconazole (DIFLUCAN) 150 MG tablet Take 150 mg by mouth once a week.  . furosemide (LASIX) 80 MG tablet Take 1 tablet (80 mg total) by mouth 2 (two) times daily.  Marland Kitchen gabapentin (NEURONTIN) 400 MG capsule Take 400 mg by mouth 2 (two) times daily.  Marland Kitchen gabapentin (NEURONTIN) 400 MG capsule Take 400 mg by mouth 3 (three) times daily. Take 2 capsules by mouth in the mornings at 7 AM daily  . Lidocaine 1.8 % PTCH Apply topically. Apply to right chest topically daily for pain  . metoprolol succinate  (TOPROL-XL) 50 MG 24 hr tablet Take 1 tablet (50 mg total) by mouth daily.  . nitroGLYCERIN (NITROSTAT) 0.4 MG SL tablet Place 1 tablet (0.4 mg total) under the tongue every 5 (five) minutes as needed for chest pain (MAX 3 TABLETS).  Marland Kitchen nystatin (MYCOSTATIN/NYSTOP) powder Apply topically 2 (two) times daily.  . potassium chloride (KLOR-CON) 10 MEQ tablet Take 10 mEq by mouth daily. Take 1 tablet by mouth in the evening for low iron  . spironolactone (ALDACTONE) 25 MG tablet Take 1 tablet (25 mg total) by mouth daily.  . traMADol (ULTRAM) 50 MG tablet Take 1 tablet (50 mg total) by mouth every 6 (six) hours as needed for severe pain.     Allergies:   Aspirin, Other, Penicillins, Codeine, Levofloxacin in d5w, Morphine and related, Sulfa antibiotics, Ether, Prednisone, and Procaine   Social History   Tobacco Use  . Smoking status: Former Smoker    Types: Cigarettes  . Smokeless tobacco: Never Used  . Tobacco comment: quit 4  months ago  Vaping Use  . Vaping Use: Never used  Substance Use Topics  . Alcohol use: Yes    Alcohol/week: 0.0 standard drinks    Comment: Occasional  . Drug use: No     Family Hx: The patient's family history includes Cancer in her brother and daughter; Dementia in her brother; Hypertension in her mother.  ROS:   Please see the history of present illness.    (+) Central and Right Chest pain (+) "Catching"/Sharp chest pain (+) Shortness of breath (+) Congestion (+) LE edema All other systems reviewed and are negative.   Labs/Other Tests and Data Reviewed:    Echo 01/2018: - Left ventricle: The cavity size was normal. Wall thickness was  increased in a pattern of moderate LVH. Systolic function was  normal. The estimated ejection fraction was in the range of 60%  to 65%. Wall motion was normal; there were no regional wall  motion abnormalities.  - Mitral valve: Severely calcified annulus.  - Left atrium: The atrium was severely dilated.  -  Right atrium: The atrium was mildly dilated.  - Pulmonary arteries: Systolic pressure was mildly increased. PA  peak pressure: 44 mm Hg (S).  - Pericardium, extracardiac: A small pericardial effusion was  identified.   Impressions:  - Normal LV systolic function; moderate LVH; biatrial enlargement;  trace TR with mild pulmonary hypertension; small pericardial  effusion.    Labs, prior tests, procedures, and surgery:  Permanent pacemaker system implantation.   EKG: 07/07/2020: Ventricular-paced, rate 60 bpm  Recent Labs: 12/31/2019: TSH 3.65 02/23/2020: ALT 12; Brain Natriuretic Peptide 215 05/23/2020: BUN 18; Creatinine, Ser 0.87; Hemoglobin 14.5; Magnesium 1.8; Platelets 237; Potassium 2.5; Sodium 137   Recent Lipid Panel Lab Results  Component Value Date/Time   CHOL 157 12/24/2017 09:15 AM   TRIG 78 12/24/2017 09:15 AM   HDL 63 12/24/2017 09:15 AM   CHOLHDL 2.5 12/24/2017 09:15 AM   CHOLHDL 3.2 10/13/2015 11:15 AM   LDLCALC 78 12/24/2017 09:15 AM    Wt Readings from Last 3 Encounters:  07/07/20 213 lb 12.8 oz (97 kg)  12/30/18 203 lb (92.1 kg)  11/10/18 216 lb (98 kg)     Objective:    VS:  BP 136/73   Pulse (!) 59   Ht 5' (1.524 m)   Wt 213 lb 12.8 oz (97 kg)   SpO2 97%   BMI 41.75 kg/m  , BMI Body mass index is 41.75 kg/m. GENERAL:  In wheelchair.  Chronically ill-appearing. HEENT: Pupils equal round and reactive, fundi not visualized, oral mucosa unremarkable NECK:  No jugular venous distention, waveform within normal limits, carotid upstroke brisk and symmetric, no bruits, no thyromegaly LYMPHATICS:  No cervical adenopathy LUNGS:  Clear to auscultation bilaterally HEART:  RRR.  PMI not displaced or sustained,S1 and S2 within normal limits, no S3, no S4, no clicks, no rubs, murmurs ABD:  Flat, positive bowel sounds normal in frequency in pitch, no bruits, no rebound, no guarding, no midline pulsatile mass, no hepatomegaly, no splenomegaly EXT:  2 plus  pulses throughout, 1+ LE edema bilaterally, no cyanosis no clubbing SKIN:  No rashes no nodules NEURO:  Cranial nerves II through XII grossly intact, motor grossly intact throughout PSYCH:  Cognitively intact, oriented to person place and time    ASSESSMENT & PLAN:   CAD (coronary artery disease) She reports chest pain that seems mostly atypical and on the right side of her chest.  This is likely from her shingles.  However she does also report exertional symptoms.  We will get a Lexiscan Myoview to assess for ischemia.  Permanent atrial fibrillation She is ventricularly paced at 60 beats a minute.  Continue Eliquis.  Venous insufficiency of both lower extremities Mild lower extremity edema likely due to venous insufficiency.  Acute on chronic diastolic CHF (congestive heart failure) (HCC) Ms. Dwire has mild lower extremity edema.  I suspect this is due to venous insufficiency.  Her JVD does not seem to be elevated, but it is difficult to assess.  She has also noted exertional dyspnea.  She is not getting much walking or exercise at baseline and has had a hard time recovering from her episode of shingles.  We will get an echo to make sure there is been no changes in her systolic function or volume status.  Continue Lasix and spironolactone.  Blood pressure is well-controlled.   Medication Adjustments/Labs and Tests Ordered: Current medicines are reviewed at length with the patient today.  Concerns regarding medicines are outlined above.    Tests Ordered: Orders Placed This Encounter  Procedures  . MYOCARDIAL PERFUSION IMAGING  . EKG 12-Lead  . ECHOCARDIOGRAM COMPLETE   Medication Changes: No orders of the defined types were placed in this encounter.   Disposition:  F/u with Alter Moss C. Duke Salvia, MD, [facility] in 6 months  I,Mathew Stumpf,acting as a Neurosurgeon for Chilton Si, MD.,have documented all relevant documentation on the behalf of Chilton Si, MD,as directed by   Chilton Si, MD while in the presence of Chilton Si, MD.  I, Roger Kettles C. Duke Salvia, MD have reviewed all documentation for this visit.  The documentation of the exam, diagnosis, procedures, and orders on 07/07/2020 are all accurate and complete.   Signed, Chilton Si, MD  07/07/2020 12:16 PM    [facility]

## 2020-07-07 NOTE — Assessment & Plan Note (Signed)
She reports chest pain that seems mostly atypical and on the right side of her chest.  This is likely from her shingles.  However she does also report exertional symptoms.  We will get a Lexiscan Myoview to assess for ischemia.

## 2020-07-07 NOTE — Assessment & Plan Note (Signed)
Mild lower extremity edema likely due to venous insufficiency.

## 2020-07-07 NOTE — Patient Instructions (Addendum)
Medication Instructions:  Your physician recommends that you continue on your current medications as directed. Please refer to the Current Medication list given to you today.   *If you need a refill on your cardiac medications before your next appointment, please call your pharmacy*   Lab Work: NONE   Testing/Procedures: Your physician has requested that you have a lexiscan myoview. For further information please visit [email or web address] Please follow instruction sheet, as given.  Your physician has requested that you have an echocardiogram. Echocardiography is a painless test that uses sound waves to create images of your heart. It provides your doctor with information about the size and shape of your heart and how well your heart's chambers and valves are working. This procedure takes approximately one hour. There are no restrictions for this procedure. [facility] AT [address]   Follow-Up: At [facility], you and your health needs are our priority.  As part of our continuing mission to provide you with exceptional heart care, we have created designated Provider Care Teams.  These Care Teams include your primary Cardiologist (physician) and Advanced Practice Providers (APPs -  Physician Assistants and Nurse Practitioners) who all work together to provide you with the care you need, when you need it.  We recommend signing up for the patient portal called "MyChart".  Sign up information is provided on this After Visit Summary.  MyChart is used to connect with patients for Virtual Visits (Telemedicine).  Patients are able to view lab/test results, encounter notes, upcoming appointments, etc.  Non-urgent messages can be sent to your provider as well.   To learn more about what you can do with MyChart, go to [email or web address].    Your next appointment:   6 month(s)  The format for your next appointment:   In Person  Provider:   DR [name]

## 2020-07-07 NOTE — Assessment & Plan Note (Addendum)
Katherine Cervantes has mild lower extremity edema.  I suspect this is due to venous insufficiency.  Her JVD does not seem to be elevated, but it is difficult to assess.  She has also noted exertional dyspnea.  She is not getting much walking or exercise at baseline and has had a hard time recovering from her episode of shingles.  We will get an echo to make sure there is been no changes in her systolic function or volume status.  Continue Lasix and spironolactone.  Blood pressure is well-controlled.

## 2020-07-12 DIAGNOSIS — I110 Hypertensive heart disease with heart failure: Secondary | ICD-10-CM | POA: Diagnosis not present

## 2020-07-12 DIAGNOSIS — J45909 Unspecified asthma, uncomplicated: Secondary | ICD-10-CM | POA: Diagnosis not present

## 2020-07-12 DIAGNOSIS — G894 Chronic pain syndrome: Secondary | ICD-10-CM | POA: Diagnosis not present

## 2020-07-12 DIAGNOSIS — G603 Idiopathic progressive neuropathy: Secondary | ICD-10-CM | POA: Diagnosis not present

## 2020-07-12 DIAGNOSIS — I5033 Acute on chronic diastolic (congestive) heart failure: Secondary | ICD-10-CM | POA: Diagnosis not present

## 2020-07-12 DIAGNOSIS — E1142 Type 2 diabetes mellitus with diabetic polyneuropathy: Secondary | ICD-10-CM | POA: Diagnosis not present

## 2020-07-12 DIAGNOSIS — I4821 Permanent atrial fibrillation: Secondary | ICD-10-CM | POA: Diagnosis not present

## 2020-07-12 DIAGNOSIS — I2510 Atherosclerotic heart disease of native coronary artery without angina pectoris: Secondary | ICD-10-CM | POA: Diagnosis not present

## 2020-07-17 DIAGNOSIS — E1142 Type 2 diabetes mellitus with diabetic polyneuropathy: Secondary | ICD-10-CM | POA: Diagnosis not present

## 2020-07-17 DIAGNOSIS — I5033 Acute on chronic diastolic (congestive) heart failure: Secondary | ICD-10-CM | POA: Diagnosis not present

## 2020-07-17 DIAGNOSIS — R2232 Localized swelling, mass and lump, left upper limb: Secondary | ICD-10-CM | POA: Diagnosis not present

## 2020-07-17 DIAGNOSIS — I110 Hypertensive heart disease with heart failure: Secondary | ICD-10-CM | POA: Diagnosis not present

## 2020-07-17 DIAGNOSIS — J45909 Unspecified asthma, uncomplicated: Secondary | ICD-10-CM | POA: Diagnosis not present

## 2020-07-17 DIAGNOSIS — M79642 Pain in left hand: Secondary | ICD-10-CM | POA: Diagnosis not present

## 2020-07-17 DIAGNOSIS — I4821 Permanent atrial fibrillation: Secondary | ICD-10-CM | POA: Diagnosis not present

## 2020-07-17 DIAGNOSIS — I2510 Atherosclerotic heart disease of native coronary artery without angina pectoris: Secondary | ICD-10-CM | POA: Diagnosis not present

## 2020-07-20 DIAGNOSIS — I2510 Atherosclerotic heart disease of native coronary artery without angina pectoris: Secondary | ICD-10-CM | POA: Diagnosis not present

## 2020-07-20 DIAGNOSIS — J45909 Unspecified asthma, uncomplicated: Secondary | ICD-10-CM | POA: Diagnosis not present

## 2020-07-20 DIAGNOSIS — E1142 Type 2 diabetes mellitus with diabetic polyneuropathy: Secondary | ICD-10-CM | POA: Diagnosis not present

## 2020-07-20 DIAGNOSIS — I110 Hypertensive heart disease with heart failure: Secondary | ICD-10-CM | POA: Diagnosis not present

## 2020-07-20 DIAGNOSIS — I5033 Acute on chronic diastolic (congestive) heart failure: Secondary | ICD-10-CM | POA: Diagnosis not present

## 2020-07-20 DIAGNOSIS — I4821 Permanent atrial fibrillation: Secondary | ICD-10-CM | POA: Diagnosis not present

## 2020-07-24 DIAGNOSIS — G603 Idiopathic progressive neuropathy: Secondary | ICD-10-CM | POA: Diagnosis not present

## 2020-07-24 DIAGNOSIS — G894 Chronic pain syndrome: Secondary | ICD-10-CM | POA: Diagnosis not present

## 2020-07-25 DIAGNOSIS — I4821 Permanent atrial fibrillation: Secondary | ICD-10-CM | POA: Diagnosis not present

## 2020-07-25 DIAGNOSIS — J45909 Unspecified asthma, uncomplicated: Secondary | ICD-10-CM | POA: Diagnosis not present

## 2020-07-25 DIAGNOSIS — I2510 Atherosclerotic heart disease of native coronary artery without angina pectoris: Secondary | ICD-10-CM | POA: Diagnosis not present

## 2020-07-25 DIAGNOSIS — I110 Hypertensive heart disease with heart failure: Secondary | ICD-10-CM | POA: Diagnosis not present

## 2020-07-25 DIAGNOSIS — E1142 Type 2 diabetes mellitus with diabetic polyneuropathy: Secondary | ICD-10-CM | POA: Diagnosis not present

## 2020-07-25 DIAGNOSIS — I5033 Acute on chronic diastolic (congestive) heart failure: Secondary | ICD-10-CM | POA: Diagnosis not present

## 2020-07-26 ENCOUNTER — Other Ambulatory Visit (HOSPITAL_COMMUNITY): Payer: Medicare Other

## 2020-07-26 ENCOUNTER — Encounter (HOSPITAL_COMMUNITY): Payer: Medicare Other

## 2020-07-26 ENCOUNTER — Telehealth (HOSPITAL_COMMUNITY): Payer: Self-pay | Admitting: *Deleted

## 2020-07-26 DIAGNOSIS — H1033 Unspecified acute conjunctivitis, bilateral: Secondary | ICD-10-CM | POA: Diagnosis not present

## 2020-07-26 DIAGNOSIS — I480 Paroxysmal atrial fibrillation: Secondary | ICD-10-CM | POA: Diagnosis not present

## 2020-07-26 DIAGNOSIS — L03114 Cellulitis of left upper limb: Secondary | ICD-10-CM | POA: Diagnosis not present

## 2020-07-26 DIAGNOSIS — G603 Idiopathic progressive neuropathy: Secondary | ICD-10-CM | POA: Diagnosis not present

## 2020-07-26 DIAGNOSIS — R600 Localized edema: Secondary | ICD-10-CM | POA: Diagnosis not present

## 2020-07-26 NOTE — Telephone Encounter (Signed)
Close encounter 

## 2020-07-27 DIAGNOSIS — E1142 Type 2 diabetes mellitus with diabetic polyneuropathy: Secondary | ICD-10-CM | POA: Diagnosis not present

## 2020-07-27 DIAGNOSIS — I110 Hypertensive heart disease with heart failure: Secondary | ICD-10-CM | POA: Diagnosis not present

## 2020-07-27 DIAGNOSIS — I5033 Acute on chronic diastolic (congestive) heart failure: Secondary | ICD-10-CM | POA: Diagnosis not present

## 2020-07-27 DIAGNOSIS — I4821 Permanent atrial fibrillation: Secondary | ICD-10-CM | POA: Diagnosis not present

## 2020-07-27 DIAGNOSIS — J45909 Unspecified asthma, uncomplicated: Secondary | ICD-10-CM | POA: Diagnosis not present

## 2020-07-27 DIAGNOSIS — I2510 Atherosclerotic heart disease of native coronary artery without angina pectoris: Secondary | ICD-10-CM | POA: Diagnosis not present

## 2020-07-28 ENCOUNTER — Encounter (HOSPITAL_COMMUNITY): Payer: Self-pay

## 2020-07-28 ENCOUNTER — Ambulatory Visit (HOSPITAL_COMMUNITY)
Admission: RE | Admit: 2020-07-28 | Discharge: 2020-07-28 | Disposition: A | Discharge status: Home / Self Care | Payer: Medicare Other | Source: Ambulatory Visit | Attending: Internal Medicine | Admitting: Internal Medicine

## 2020-07-28 ENCOUNTER — Other Ambulatory Visit: Payer: Self-pay

## 2020-07-28 ENCOUNTER — Ambulatory Visit (HOSPITAL_BASED_OUTPATIENT_CLINIC_OR_DEPARTMENT_OTHER): Payer: Medicare Other

## 2020-07-28 DIAGNOSIS — R079 Chest pain, unspecified: Secondary | ICD-10-CM

## 2020-07-28 LAB — ECHOCARDIOGRAM COMPLETE: S' Lateral: 2.5 cm

## 2020-07-28 NOTE — Progress Notes (Unsigned)
Patient declined the administration of Definity. 

## 2020-07-28 NOTE — Progress Notes (Addendum)
Katherine Cervantes wanted to cancel MPI. She is claustrophobic and also cannot lay down for images. I offered several options to make her more comfortable but she was scared she would have a panic attack. I encouraged her to reschedule at [facility]. She would like to think about this before scheduling.

## 2020-07-31 ENCOUNTER — Telehealth (HOSPITAL_COMMUNITY): Payer: Self-pay | Admitting: Cardiovascular Disease

## 2020-07-31 NOTE — Telephone Encounter (Signed)
Patient cancelled Myoview and does not wish to reschedule at this time. See below:  07/28/2020 2:23 PM By:By:COMPTON, GINA E  Cancel Rsn: Patient  Order will be removed from the [facility] and if patient decides she wants to have we can reinstate the order. Thank you.

## 2020-08-01 DIAGNOSIS — I4821 Permanent atrial fibrillation: Secondary | ICD-10-CM | POA: Diagnosis not present

## 2020-08-01 DIAGNOSIS — I110 Hypertensive heart disease with heart failure: Secondary | ICD-10-CM | POA: Diagnosis not present

## 2020-08-01 DIAGNOSIS — I5033 Acute on chronic diastolic (congestive) heart failure: Secondary | ICD-10-CM | POA: Diagnosis not present

## 2020-08-01 DIAGNOSIS — E1142 Type 2 diabetes mellitus with diabetic polyneuropathy: Secondary | ICD-10-CM | POA: Diagnosis not present

## 2020-08-01 DIAGNOSIS — I2510 Atherosclerotic heart disease of native coronary artery without angina pectoris: Secondary | ICD-10-CM | POA: Diagnosis not present

## 2020-08-01 DIAGNOSIS — J45909 Unspecified asthma, uncomplicated: Secondary | ICD-10-CM | POA: Diagnosis not present

## 2020-08-03 DIAGNOSIS — J45909 Unspecified asthma, uncomplicated: Secondary | ICD-10-CM | POA: Diagnosis not present

## 2020-08-03 DIAGNOSIS — I4821 Permanent atrial fibrillation: Secondary | ICD-10-CM | POA: Diagnosis not present

## 2020-08-03 DIAGNOSIS — E1142 Type 2 diabetes mellitus with diabetic polyneuropathy: Secondary | ICD-10-CM | POA: Diagnosis not present

## 2020-08-03 DIAGNOSIS — I110 Hypertensive heart disease with heart failure: Secondary | ICD-10-CM | POA: Diagnosis not present

## 2020-08-03 DIAGNOSIS — I5033 Acute on chronic diastolic (congestive) heart failure: Secondary | ICD-10-CM | POA: Diagnosis not present

## 2020-08-03 DIAGNOSIS — I2510 Atherosclerotic heart disease of native coronary artery without angina pectoris: Secondary | ICD-10-CM | POA: Diagnosis not present

## 2020-08-09 DIAGNOSIS — I5033 Acute on chronic diastolic (congestive) heart failure: Secondary | ICD-10-CM | POA: Diagnosis not present

## 2020-08-09 DIAGNOSIS — I2510 Atherosclerotic heart disease of native coronary artery without angina pectoris: Secondary | ICD-10-CM | POA: Diagnosis not present

## 2020-08-09 DIAGNOSIS — E1142 Type 2 diabetes mellitus with diabetic polyneuropathy: Secondary | ICD-10-CM | POA: Diagnosis not present

## 2020-08-09 DIAGNOSIS — J45909 Unspecified asthma, uncomplicated: Secondary | ICD-10-CM | POA: Diagnosis not present

## 2020-08-09 DIAGNOSIS — I4821 Permanent atrial fibrillation: Secondary | ICD-10-CM | POA: Diagnosis not present

## 2020-08-09 DIAGNOSIS — I110 Hypertensive heart disease with heart failure: Secondary | ICD-10-CM | POA: Diagnosis not present

## 2020-08-09 NOTE — Progress Notes (Signed)
Cardiology Office Note Date:  08/10/2020  Patient ID:  Katherine Cervantes, Katherine Cervantes November 14, 1929, MRN [id number] PCP:  Sunnie Nielsen, DO  Cardiologist:  Dr. Duke Salvia Electrophysiologist: Dr. Ladona Ridgel    Chief Complaint: over due device visit  History of Present Illness: Katherine Cervantes is a 87 y.o. female with history of anxiety/depression, sinus node dysfunction w/PPM, AFib, HTN, chronic CHF (systolic), DM, obesity, venous stasis  She comes in today to be seen for Dr. Ladona Ridgel, last seen by him 11/10/2018.  More recently she saw Dr. Duke Salvia having been lost to f/u, mentioned some CP and some degree of SOB AFib described as permanent, edema suspect to be venous stat=sis but hard to assess Suspect symptoms from recent shingles and deconditioning though planned for stress test and echo.  + remotes  TODAY She comes accompanied by her daughter. She is living now at [facility], her medicines managed by the staff.  They did not send any paperwork with her  today, they do not think anything has changed from her visit with dr. Duke Salvia. She mentions that she will be following with Dr. [name] going forward. She did not get the stress test done, was unable to lay down for the scan and they were told could be rescheduled at this office that can accommodate a wedge/slight incline.  She tells me she has slept in a recliner for the last 4 or more years, her daughter thinks is more panic then SOB, but either way they state this is not new for many years now She denies SOB at rest inclined or seated. She does have DOE again reported at her basline  They report no changes or new symptoms, changing symptoms sunce her visit with Dr. Duke Salvia No near syncope or syncope No bleeding or signs of bleeding  Device information SJM dual chamber PPM 07/10/2007 > gen change Oct 2020 Programmed VVIR   Past Medical History:  Diagnosis Date   Anticoagulation adequate 05/22/2018   Asthma    Atrial fibrillation  (HCC) 09/27/2011   CAD (coronary artery disease) 09/27/2011   Diabetes mellitus without complication (HCC)    Diverticulosis 11/04/2011   Dyslipidemia 09/27/2011   History of vasculitis 09/27/2011   Hypertension 09/27/2011   Incontinence 10/15/2015   Morbid obesity with BMI of 40.0-44.9, adult (HCC) 10/03/2016   Osteoporosis 09/27/2011   Pacemaker    PUD (peptic ulcer disease)    Rheumatoid arthritis(714.0) 09/27/2011   Short-term memory loss 10/03/2016   Sleep apnea 09/27/2011    Past Surgical History:  Procedure Laterality Date   ABDOMINAL HYSTERECTOMY     ANKLE SURGERY     APPENDECTOMY     Carpal tunnel     CHOLECYSTECTOMY     PARTIAL GASTRECTOMY     PPM GENERATOR CHANGEOUT N/A 11/13/2018   Procedure: PPM GENERATOR CHANGEOUT;  Surgeon: Marinus Maw, MD;  Location: [facility];  Service: Cardiovascular;  Laterality: N/A;   REPLACEMENT TOTAL KNEE Bilateral     Current Outpatient Medications  Medication Sig Dispense Refill   acetaminophen (TYLENOL) 325 MG tablet Take 2 tablets (650 mg total) by mouth every 4 (four) hours as needed for headache or mild pain.     albuterol (VENTOLIN HFA) 108 (90 Base) MCG/ACT inhaler Inhale 1-2 puffs into the lungs every 4 (four) hours as needed for wheezing or shortness of breath. 6.7 g 1   clotrimazole (LOTRIMIN) 1 % cream Apply topically as needed. Apply topically to groin area, breast folds and inner thigh 2  times a day for yeast     furosemide (LASIX) 80 MG tablet Take 1 tablet (80 mg total) by mouth 2 (two) times daily. 180 tablet 3   gabapentin (NEURONTIN) 400 MG capsule Take 400 mg by mouth 3 (three) times daily. Take 2 capsules by mouth in the mornings at 7 AM daily     metoprolol succinate (TOPROL-XL) 50 MG 24 hr tablet Take 1 tablet (50 mg total) by mouth daily. 90 tablet 3   nitroGLYCERIN (NITROSTAT) 0.4 MG SL tablet Place 1 tablet (0.4 mg total) under the tongue every 5 (five) minutes as needed for chest pain (MAX 3 TABLETS). 25 tablet 3    nystatin (MYCOSTATIN/NYSTOP) powder Apply topically 2 (two) times daily.     potassium chloride (KLOR-CON) 10 MEQ tablet Take 10 mEq by mouth daily. Take 1 tablet by mouth in the evening for low iron     spironolactone (ALDACTONE) 25 MG tablet Take 1 tablet (25 mg total) by mouth daily. 30 tablet 6   traMADol (ULTRAM) 50 MG tablet Take 1 tablet (50 mg total) by mouth every 6 (six) hours as needed for severe pain. 30 tablet 0   fluconazole (DIFLUCAN) 150 MG tablet Take 150 mg by mouth once a week.     rivaroxaban (XARELTO) 20 MG TABS tablet Take 1 tablet (20 mg total) by mouth daily. 90 tablet 0   No current facility-administered medications for this visit.    Allergies:   Aspirin, Other, Penicillins, Codeine, Levofloxacin in d5w, Morphine and related, Sulfa antibiotics, Ether, Prednisone, and Procaine   Social History:  The patient  reports that she has quit smoking. Her smoking use included cigarettes. She has never used smokeless tobacco. She reports current alcohol use. She reports that she does not use drugs.   Family History:  The patient's family history includes Cancer in her brother and daughter; Dementia in her brother; Hypertension in her mother.  ROS:  Please see the history of present illness.    All other systems are reviewed and otherwise negative.   PHYSICAL EXAM:  VS:  BP 112/62   Pulse 60   Ht 5' (1.524 m)   Wt 213 lb (96.6 kg)   SpO2 97%   BMI 41.60 kg/m  BMI: Body mass index is 41.6 kg/m. Well nourished, well developed, in no acute distress, chronically ill appearing HEENT: normocephalic, atraumatic Neck: no JVD, carotid bruits or masses Cardiac:  RRR; (paced)no significant murmurs, no rubs, or gallops Lungs: CTA b/l, no wheezing, rhonchi or rales Abd: soft, nontender, obese MS: no deformity or atrophy Ext: + brawny and 1+ edema with dark chronic looking skin changes (bth report as unchanged for years) Skin: warm and dry, no rash Neuro:  No gross deficits  appreciated Psych: euthymic mood, full affect  PPM site is stable, no tethering or discomfort   EKG:  not done today  Device interrogation done today and reviewed by myself:  Battery and lead measurements are good No HVRs 93% VP No R waves today at 40   07/28/2020: TTE IMPRESSIONS   1. Possible hypokinesis of the apex (not well visualized; pt declined  definity); overall LV function appears to be perserved.   2. Left ventricular ejection fraction, by estimation, is 55 to 60%. The  left ventricle has normal function. The left ventricle demonstrates  regional wall motion abnormalities (see scoring diagram/findings for  description). Left ventricular diastolic  parameters are indeterminate.   3. Right ventricular systolic function is normal. The right  ventricular  size is normal.   4. Left atrial size was moderately dilated.   5. Right atrial size was moderately dilated.   6. A small pericardial effusion is present.   7. The mitral valve is normal in structure. Trivial mitral valve  regurgitation. No evidence of mitral stenosis. Moderate mitral annular  calcification.   8. The aortic valve is tricuspid. Aortic valve regurgitation is trivial.  No aortic stenosis is present.   9. The inferior vena cava is normal in size with greater than 50%  respiratory variability, suggesting right atrial pressure of 3 mmHg.    Echo 01/31/18 Study Conclusions - Left ventricle: The cavity size was normal. Wall thickness was   increased in a pattern of moderate LVH. Systolic function was   normal. The estimated ejection fraction was in the range of 60%   to 65%. Wall motion was normal; there were no regional wall   motion abnormalities. - Mitral valve: Severely calcified annulus. - Left atrium: The atrium was severely dilated. - Right atrium: The atrium was mildly dilated. - Pulmonary arteries: Systolic pressure was mildly increased. PA   peak pressure: 44 mm Hg (S). - Pericardium,  extracardiac: A small pericardial effusion was   identified.   Impressions: - Normal LV systolic function; moderate LVH; biatrial enlargement;   trace TR with mild pulmonary hypertension; small pericardial   effusion.   Recent Labs: 12/31/2019: TSH 3.65 02/23/2020: ALT 12; Brain Natriuretic Peptide 215 05/23/2020: BUN 18; Creatinine, Ser 0.87; Hemoglobin 14.5; Magnesium 1.8; Platelets 237; Potassium 2.5; Sodium 137  No results found for requested labs within last 8760 hours.   CrCl cannot be calculated (Patient's most recent lab result is older than the maximum 21 days allowed.).   Wt Readings from Last 3 Encounters:  08/10/20 213 lb (96.6 kg)  07/28/20 213 lb (96.6 kg)  07/07/20 213 lb 12.8 oz (97 kg)     Other studies reviewed: Additional studies/records reviewed today include: summarized above  ASSESSMENT AND PLAN:  PPM Intact function, no programming changes made   2. Permanent AFib CHA2DS2Vasc is 6, on Xarelto, appropriatly dosed 93% paced/rate controlled   3. Chronic CHF (diastolic) Lungs are clear, they escribe her at her known baseline for several years 4. CP Pending reschedule of her stress test Deferred to Dr. Jari Sportsman pending results Daughter mentions has been felt to me a nerve pain None since her visit with Dr. Duke Salvia No new symptoms  5. Hypokalemia Last lab was better 3.8 Daughter mentions the previous facility had not been giving her the K+/some of her meds this is what prompted her to move to the current facility   Disposition: F/u with remotes Q 74mo and in clinic with EP in 1 year, sooner if needed   Current medicines are reviewed at length with the patient today.  The patient did not have any concerns regarding medicines.  Norma Fredrickson, PA-C 08/10/2020 11:35 AM     [facility] [address] [place] [address] [phone] (office)  [phone] (fax)

## 2020-08-10 ENCOUNTER — Other Ambulatory Visit: Payer: Self-pay

## 2020-08-10 ENCOUNTER — Encounter: Payer: Self-pay | Admitting: Physician Assistant

## 2020-08-10 ENCOUNTER — Ambulatory Visit (INDEPENDENT_AMBULATORY_CARE_PROVIDER_SITE_OTHER): Payer: Medicare Other | Admitting: Physician Assistant

## 2020-08-10 VITALS — BP 112/62 | HR 60 | Ht 60.0 in | Wt 213.0 lb

## 2020-08-10 DIAGNOSIS — I5032 Chronic diastolic (congestive) heart failure: Secondary | ICD-10-CM | POA: Diagnosis not present

## 2020-08-10 DIAGNOSIS — Z950 Presence of cardiac pacemaker: Secondary | ICD-10-CM

## 2020-08-10 DIAGNOSIS — I5033 Acute on chronic diastolic (congestive) heart failure: Secondary | ICD-10-CM

## 2020-08-10 DIAGNOSIS — I4821 Permanent atrial fibrillation: Secondary | ICD-10-CM | POA: Diagnosis not present

## 2020-08-10 LAB — CUP PACEART INCLINIC DEVICE CHECK
Battery Remaining Longevity: 115 mo
Battery Voltage: 3.02 V
Brady Statistic RA Percent Paced: 0 %
Brady Statistic RV Percent Paced: 93 %
Date Time Interrogation Session: 20220630151829
Implantable Lead Implant Date: 20090521
Implantable Lead Implant Date: 20090521
Implantable Lead Location: 753859
Implantable Lead Location: 753860
Implantable Pulse Generator Implant Date: 20201002
Lead Channel Impedance Value: 375 Ohm
Lead Channel Pacing Threshold Amplitude: 1.375 V
Lead Channel Pacing Threshold Pulse Width: 0.7 ms
Lead Channel Sensing Intrinsic Amplitude: 8.3 mV
Lead Channel Setting Pacing Amplitude: 1.625
Lead Channel Setting Pacing Pulse Width: 0.7 ms
Lead Channel Setting Sensing Sensitivity: 2 mV
Pulse Gen Model: 2272
Pulse Gen Serial Number: 9159208

## 2020-08-10 NOTE — Patient Instructions (Signed)

## 2020-08-18 ENCOUNTER — Ambulatory Visit (INDEPENDENT_AMBULATORY_CARE_PROVIDER_SITE_OTHER): Payer: Medicare Other

## 2020-08-18 DIAGNOSIS — J45909 Unspecified asthma, uncomplicated: Secondary | ICD-10-CM | POA: Diagnosis not present

## 2020-08-18 DIAGNOSIS — I5033 Acute on chronic diastolic (congestive) heart failure: Secondary | ICD-10-CM | POA: Diagnosis not present

## 2020-08-18 DIAGNOSIS — I4821 Permanent atrial fibrillation: Secondary | ICD-10-CM | POA: Diagnosis not present

## 2020-08-18 DIAGNOSIS — I110 Hypertensive heart disease with heart failure: Secondary | ICD-10-CM | POA: Diagnosis not present

## 2020-08-18 DIAGNOSIS — I495 Sick sinus syndrome: Secondary | ICD-10-CM

## 2020-08-18 DIAGNOSIS — I2510 Atherosclerotic heart disease of native coronary artery without angina pectoris: Secondary | ICD-10-CM | POA: Diagnosis not present

## 2020-08-18 DIAGNOSIS — E1142 Type 2 diabetes mellitus with diabetic polyneuropathy: Secondary | ICD-10-CM | POA: Diagnosis not present

## 2020-08-19 LAB — CUP PACEART REMOTE DEVICE CHECK
Battery Remaining Longevity: 103 mo
Battery Remaining Percentage: 86 %
Battery Voltage: 3.02 V
Brady Statistic RV Percent Paced: 98 %
Date Time Interrogation Session: 20220708020013
Implantable Lead Implant Date: 20090521
Implantable Lead Implant Date: 20090521
Implantable Lead Location: 753859
Implantable Lead Location: 753860
Implantable Pulse Generator Implant Date: 20201002
Lead Channel Impedance Value: 380 Ohm
Lead Channel Pacing Threshold Amplitude: 1.25 V
Lead Channel Pacing Threshold Pulse Width: 0.7 ms
Lead Channel Sensing Intrinsic Amplitude: 9.9 mV
Lead Channel Setting Pacing Amplitude: 1.5 V
Lead Channel Setting Pacing Pulse Width: 0.7 ms
Lead Channel Setting Sensing Sensitivity: 2 mV
Pulse Gen Model: 2272
Pulse Gen Serial Number: 9159208

## 2020-08-22 DIAGNOSIS — M79674 Pain in right toe(s): Secondary | ICD-10-CM | POA: Diagnosis not present

## 2020-08-22 DIAGNOSIS — B351 Tinea unguium: Secondary | ICD-10-CM | POA: Diagnosis not present

## 2020-08-22 DIAGNOSIS — E1142 Type 2 diabetes mellitus with diabetic polyneuropathy: Secondary | ICD-10-CM | POA: Diagnosis not present

## 2020-08-22 DIAGNOSIS — I2510 Atherosclerotic heart disease of native coronary artery without angina pectoris: Secondary | ICD-10-CM | POA: Diagnosis not present

## 2020-08-22 DIAGNOSIS — M79675 Pain in left toe(s): Secondary | ICD-10-CM | POA: Diagnosis not present

## 2020-08-22 DIAGNOSIS — J45909 Unspecified asthma, uncomplicated: Secondary | ICD-10-CM | POA: Diagnosis not present

## 2020-08-22 DIAGNOSIS — I4821 Permanent atrial fibrillation: Secondary | ICD-10-CM | POA: Diagnosis not present

## 2020-08-22 DIAGNOSIS — I110 Hypertensive heart disease with heart failure: Secondary | ICD-10-CM | POA: Diagnosis not present

## 2020-08-22 DIAGNOSIS — I5033 Acute on chronic diastolic (congestive) heart failure: Secondary | ICD-10-CM | POA: Diagnosis not present

## 2020-08-23 ENCOUNTER — Telehealth: Payer: Self-pay | Admitting: *Deleted

## 2020-08-23 NOTE — Chronic Care Management (AMB) (Signed)
  Chronic Care Management   Note  08/23/2020 Name: Katherine Cervantes MRN: [id number] DOB: 06/03/1929  Katherine Cervantes is a 87 y.o. year old female who is a primary care patient of Emeterio Reeve, DO. I reached out to Zachery Dauer by phone today in response to a referral sent by Katherine. Katherine Cervantes's PCP Emeterio Reeve, DO     Katherine Cervantes was given information about Chronic Care Management services today including:  CCM service includes personalized support from designated clinical staff supervised by her physician, including individualized plan of care and coordination with other care providers 24/7 contact phone numbers for assistance for urgent and routine care needs. Service will only be billed when office clinical staff spend 20 minutes or more in a month to coordinate care. Only one practitioner may furnish and bill the service in a calendar month. The patient may stop CCM services at any time (effective at the end of the month) by phone call to the office staff. The patient will be responsible for cost sharing (co-pay) of up to 20% of the service fee (after annual deductible is met).  Patient did not agree to enrollment in care management services and does not wish to consider at this time.Katherine Cervantes lives at [place]  Follow up plan: Patient declines further follow up and engagement by the care management team. Appropriate care team members and provider have been notified via electronic communication.   Julian Hy, [place] [facility]  Direct Dial: [phone]

## 2020-08-25 DIAGNOSIS — E1142 Type 2 diabetes mellitus with diabetic polyneuropathy: Secondary | ICD-10-CM | POA: Diagnosis not present

## 2020-08-25 DIAGNOSIS — J45909 Unspecified asthma, uncomplicated: Secondary | ICD-10-CM | POA: Diagnosis not present

## 2020-08-25 DIAGNOSIS — I4821 Permanent atrial fibrillation: Secondary | ICD-10-CM | POA: Diagnosis not present

## 2020-08-25 DIAGNOSIS — I110 Hypertensive heart disease with heart failure: Secondary | ICD-10-CM | POA: Diagnosis not present

## 2020-08-25 DIAGNOSIS — G894 Chronic pain syndrome: Secondary | ICD-10-CM | POA: Diagnosis not present

## 2020-08-25 DIAGNOSIS — G603 Idiopathic progressive neuropathy: Secondary | ICD-10-CM | POA: Diagnosis not present

## 2020-08-25 DIAGNOSIS — I5033 Acute on chronic diastolic (congestive) heart failure: Secondary | ICD-10-CM | POA: Diagnosis not present

## 2020-08-25 DIAGNOSIS — I2510 Atherosclerotic heart disease of native coronary artery without angina pectoris: Secondary | ICD-10-CM | POA: Diagnosis not present

## 2020-08-28 DIAGNOSIS — I110 Hypertensive heart disease with heart failure: Secondary | ICD-10-CM | POA: Diagnosis not present

## 2020-08-28 DIAGNOSIS — I2510 Atherosclerotic heart disease of native coronary artery without angina pectoris: Secondary | ICD-10-CM | POA: Diagnosis not present

## 2020-08-28 DIAGNOSIS — I5033 Acute on chronic diastolic (congestive) heart failure: Secondary | ICD-10-CM | POA: Diagnosis not present

## 2020-08-29 DIAGNOSIS — I2510 Atherosclerotic heart disease of native coronary artery without angina pectoris: Secondary | ICD-10-CM | POA: Diagnosis not present

## 2020-08-29 DIAGNOSIS — I5033 Acute on chronic diastolic (congestive) heart failure: Secondary | ICD-10-CM | POA: Diagnosis not present

## 2020-08-29 DIAGNOSIS — I4821 Permanent atrial fibrillation: Secondary | ICD-10-CM | POA: Diagnosis not present

## 2020-08-29 DIAGNOSIS — I110 Hypertensive heart disease with heart failure: Secondary | ICD-10-CM | POA: Diagnosis not present

## 2020-08-29 DIAGNOSIS — E1142 Type 2 diabetes mellitus with diabetic polyneuropathy: Secondary | ICD-10-CM | POA: Diagnosis not present

## 2020-08-29 DIAGNOSIS — J45909 Unspecified asthma, uncomplicated: Secondary | ICD-10-CM | POA: Diagnosis not present

## 2020-09-01 DIAGNOSIS — E1142 Type 2 diabetes mellitus with diabetic polyneuropathy: Secondary | ICD-10-CM | POA: Diagnosis not present

## 2020-09-01 DIAGNOSIS — I2510 Atherosclerotic heart disease of native coronary artery without angina pectoris: Secondary | ICD-10-CM | POA: Diagnosis not present

## 2020-09-01 DIAGNOSIS — J45909 Unspecified asthma, uncomplicated: Secondary | ICD-10-CM | POA: Diagnosis not present

## 2020-09-01 DIAGNOSIS — I4821 Permanent atrial fibrillation: Secondary | ICD-10-CM | POA: Diagnosis not present

## 2020-09-01 DIAGNOSIS — I110 Hypertensive heart disease with heart failure: Secondary | ICD-10-CM | POA: Diagnosis not present

## 2020-09-01 DIAGNOSIS — I5033 Acute on chronic diastolic (congestive) heart failure: Secondary | ICD-10-CM | POA: Diagnosis not present

## 2020-09-04 ENCOUNTER — Other Ambulatory Visit: Payer: Self-pay

## 2020-09-04 ENCOUNTER — Emergency Department (HOSPITAL_BASED_OUTPATIENT_CLINIC_OR_DEPARTMENT_OTHER): Payer: Medicare Other

## 2020-09-04 ENCOUNTER — Encounter (HOSPITAL_BASED_OUTPATIENT_CLINIC_OR_DEPARTMENT_OTHER): Payer: Self-pay | Admitting: Emergency Medicine

## 2020-09-04 ENCOUNTER — Inpatient Hospital Stay (HOSPITAL_BASED_OUTPATIENT_CLINIC_OR_DEPARTMENT_OTHER)
Admission: EM | Admit: 2020-09-04 | Discharge: 2020-09-09 | DRG: 603 | Disposition: A | Discharge status: Skilled Nursing Facility | Payer: Medicare Other | Attending: Internal Medicine | Admitting: Internal Medicine

## 2020-09-04 DIAGNOSIS — G4730 Sleep apnea, unspecified: Secondary | ICD-10-CM | POA: Diagnosis present

## 2020-09-04 DIAGNOSIS — I10 Essential (primary) hypertension: Secondary | ICD-10-CM | POA: Diagnosis present

## 2020-09-04 DIAGNOSIS — Z888 Allergy status to other drugs, medicaments and biological substances status: Secondary | ICD-10-CM

## 2020-09-04 DIAGNOSIS — F32A Depression, unspecified: Secondary | ICD-10-CM | POA: Diagnosis not present

## 2020-09-04 DIAGNOSIS — L03114 Cellulitis of left upper limb: Principal | ICD-10-CM

## 2020-09-04 DIAGNOSIS — E785 Hyperlipidemia, unspecified: Secondary | ICD-10-CM | POA: Diagnosis present

## 2020-09-04 DIAGNOSIS — M79642 Pain in left hand: Secondary | ICD-10-CM | POA: Diagnosis not present

## 2020-09-04 DIAGNOSIS — R0902 Hypoxemia: Secondary | ICD-10-CM | POA: Diagnosis not present

## 2020-09-04 DIAGNOSIS — L03818 Cellulitis of other sites: Secondary | ICD-10-CM | POA: Diagnosis not present

## 2020-09-04 DIAGNOSIS — M6281 Muscle weakness (generalized): Secondary | ICD-10-CM | POA: Diagnosis not present

## 2020-09-04 DIAGNOSIS — F419 Anxiety disorder, unspecified: Secondary | ICD-10-CM | POA: Diagnosis present

## 2020-09-04 DIAGNOSIS — M810 Age-related osteoporosis without current pathological fracture: Secondary | ICD-10-CM | POA: Diagnosis not present

## 2020-09-04 DIAGNOSIS — Z66 Do not resuscitate: Secondary | ICD-10-CM | POA: Diagnosis not present

## 2020-09-04 DIAGNOSIS — G4733 Obstructive sleep apnea (adult) (pediatric): Secondary | ICD-10-CM | POA: Diagnosis not present

## 2020-09-04 DIAGNOSIS — Z8619 Personal history of other infectious and parasitic diseases: Secondary | ICD-10-CM | POA: Diagnosis not present

## 2020-09-04 DIAGNOSIS — Z28310 Unvaccinated for covid-19: Secondary | ICD-10-CM | POA: Diagnosis not present

## 2020-09-04 DIAGNOSIS — Z903 Acquired absence of stomach [part of]: Secondary | ICD-10-CM

## 2020-09-04 DIAGNOSIS — L0390 Cellulitis, unspecified: Secondary | ICD-10-CM | POA: Diagnosis present

## 2020-09-04 DIAGNOSIS — Z6841 Body Mass Index (BMI) 40.0 and over, adult: Secondary | ICD-10-CM | POA: Diagnosis not present

## 2020-09-04 DIAGNOSIS — R0789 Other chest pain: Secondary | ICD-10-CM | POA: Diagnosis not present

## 2020-09-04 DIAGNOSIS — I4821 Permanent atrial fibrillation: Secondary | ICD-10-CM | POA: Diagnosis present

## 2020-09-04 DIAGNOSIS — Z20822 Contact with and (suspected) exposure to covid-19: Secondary | ICD-10-CM | POA: Diagnosis not present

## 2020-09-04 DIAGNOSIS — Z8249 Family history of ischemic heart disease and other diseases of the circulatory system: Secondary | ICD-10-CM

## 2020-09-04 DIAGNOSIS — I872 Venous insufficiency (chronic) (peripheral): Secondary | ICD-10-CM | POA: Diagnosis present

## 2020-09-04 DIAGNOSIS — Z882 Allergy status to sulfonamides status: Secondary | ICD-10-CM

## 2020-09-04 DIAGNOSIS — Z881 Allergy status to other antibiotic agents status: Secondary | ICD-10-CM

## 2020-09-04 DIAGNOSIS — Z884 Allergy status to anesthetic agent status: Secondary | ICD-10-CM

## 2020-09-04 DIAGNOSIS — Z79899 Other long term (current) drug therapy: Secondary | ICD-10-CM

## 2020-09-04 DIAGNOSIS — Z9119 Patient's noncompliance with other medical treatment and regimen: Secondary | ICD-10-CM

## 2020-09-04 DIAGNOSIS — R079 Chest pain, unspecified: Secondary | ICD-10-CM

## 2020-09-04 DIAGNOSIS — M069 Rheumatoid arthritis, unspecified: Secondary | ICD-10-CM | POA: Diagnosis not present

## 2020-09-04 DIAGNOSIS — J45909 Unspecified asthma, uncomplicated: Secondary | ICD-10-CM | POA: Diagnosis present

## 2020-09-04 DIAGNOSIS — I2510 Atherosclerotic heart disease of native coronary artery without angina pectoris: Secondary | ICD-10-CM | POA: Diagnosis present

## 2020-09-04 DIAGNOSIS — E119 Type 2 diabetes mellitus without complications: Secondary | ICD-10-CM | POA: Diagnosis present

## 2020-09-04 DIAGNOSIS — Z8711 Personal history of peptic ulcer disease: Secondary | ICD-10-CM

## 2020-09-04 DIAGNOSIS — Z950 Presence of cardiac pacemaker: Secondary | ICD-10-CM | POA: Diagnosis present

## 2020-09-04 DIAGNOSIS — I878 Other specified disorders of veins: Secondary | ICD-10-CM | POA: Diagnosis not present

## 2020-09-04 DIAGNOSIS — Z87891 Personal history of nicotine dependence: Secondary | ICD-10-CM

## 2020-09-04 DIAGNOSIS — I480 Paroxysmal atrial fibrillation: Secondary | ICD-10-CM | POA: Diagnosis not present

## 2020-09-04 DIAGNOSIS — S2231XA Fracture of one rib, right side, initial encounter for closed fracture: Secondary | ICD-10-CM | POA: Diagnosis not present

## 2020-09-04 DIAGNOSIS — R6889 Other general symptoms and signs: Secondary | ICD-10-CM | POA: Diagnosis not present

## 2020-09-04 DIAGNOSIS — I5032 Chronic diastolic (congestive) heart failure: Secondary | ICD-10-CM | POA: Diagnosis not present

## 2020-09-04 DIAGNOSIS — I495 Sick sinus syndrome: Secondary | ICD-10-CM | POA: Diagnosis present

## 2020-09-04 DIAGNOSIS — Z886 Allergy status to analgesic agent status: Secondary | ICD-10-CM

## 2020-09-04 DIAGNOSIS — Z7901 Long term (current) use of anticoagulants: Secondary | ICD-10-CM

## 2020-09-04 DIAGNOSIS — Z885 Allergy status to narcotic agent status: Secondary | ICD-10-CM

## 2020-09-04 DIAGNOSIS — I110 Hypertensive heart disease with heart failure: Secondary | ICD-10-CM | POA: Diagnosis present

## 2020-09-04 DIAGNOSIS — E871 Hypo-osmolality and hyponatremia: Secondary | ICD-10-CM | POA: Diagnosis present

## 2020-09-04 DIAGNOSIS — M7989 Other specified soft tissue disorders: Secondary | ICD-10-CM | POA: Diagnosis not present

## 2020-09-04 DIAGNOSIS — R1312 Dysphagia, oropharyngeal phase: Secondary | ICD-10-CM | POA: Diagnosis not present

## 2020-09-04 DIAGNOSIS — R5383 Other fatigue: Secondary | ICD-10-CM | POA: Diagnosis not present

## 2020-09-04 DIAGNOSIS — Z96653 Presence of artificial knee joint, bilateral: Secondary | ICD-10-CM | POA: Diagnosis present

## 2020-09-04 DIAGNOSIS — Z7401 Bed confinement status: Secondary | ICD-10-CM | POA: Diagnosis not present

## 2020-09-04 DIAGNOSIS — Z743 Need for continuous supervision: Secondary | ICD-10-CM | POA: Diagnosis not present

## 2020-09-04 DIAGNOSIS — L03119 Cellulitis of unspecified part of limb: Secondary | ICD-10-CM | POA: Diagnosis present

## 2020-09-04 DIAGNOSIS — Z880 Allergy status to penicillin: Secondary | ICD-10-CM

## 2020-09-04 DIAGNOSIS — Z90710 Acquired absence of both cervix and uterus: Secondary | ICD-10-CM

## 2020-09-04 DIAGNOSIS — R2689 Other abnormalities of gait and mobility: Secondary | ICD-10-CM | POA: Diagnosis not present

## 2020-09-04 DIAGNOSIS — I517 Cardiomegaly: Secondary | ICD-10-CM | POA: Diagnosis not present

## 2020-09-04 DIAGNOSIS — M79643 Pain in unspecified hand: Secondary | ICD-10-CM | POA: Diagnosis not present

## 2020-09-04 HISTORY — DX: Obstructive sleep apnea (adult) (pediatric): G47.33

## 2020-09-04 HISTORY — DX: Angina pectoris, unspecified: I20.9

## 2020-09-04 HISTORY — DX: Personal history of other infectious and parasitic diseases: Z86.19

## 2020-09-04 HISTORY — DX: Heart failure, unspecified: I50.9

## 2020-09-04 HISTORY — DX: Dyspnea, unspecified: R06.00

## 2020-09-04 HISTORY — DX: Depression, unspecified: F32.A

## 2020-09-04 HISTORY — DX: Anxiety disorder, unspecified: F41.9

## 2020-09-04 LAB — COMPREHENSIVE METABOLIC PANEL
ALT: 10 U/L (ref 0–44)
AST: 16 U/L (ref 15–41)
Albumin: 3.1 g/dL — ABNORMAL LOW (ref 3.5–5.0)
Alkaline Phosphatase: 47 U/L (ref 38–126)
Anion gap: 10 (ref 5–15)
BUN: 25 mg/dL — ABNORMAL HIGH (ref 8–23)
CO2: 31 mmol/L (ref 22–32)
Calcium: 8.7 mg/dL — ABNORMAL LOW (ref 8.9–10.3)
Chloride: 95 mmol/L — ABNORMAL LOW (ref 98–111)
Creatinine, Ser: 0.89 mg/dL (ref 0.44–1.00)
GFR, Estimated: >60 mL/min (ref >60)
Glucose, Bld: 127 mg/dL — ABNORMAL HIGH (ref 70–99)
Potassium: 3.2 mmol/L — ABNORMAL LOW (ref 3.5–5.1)
Sodium: 136 mmol/L (ref 135–145)
Total Bilirubin: 0.9 mg/dL (ref 0.3–1.2)
Total Protein: 7.8 g/dL (ref 6.5–8.1)

## 2020-09-04 LAB — RESP PANEL BY RT-PCR (FLU A&B, COVID) ARPGX2
Influenza A by PCR: NEGATIVE
Influenza B by PCR: NEGATIVE
SARS Coronavirus 2 by RT PCR: NEGATIVE

## 2020-09-04 LAB — CBC WITH DIFFERENTIAL/PLATELET
Abs Immature Granulocytes: 0.05 10*3/uL (ref 0.00–0.07)
Basophils Absolute: 0 10*3/uL (ref 0.0–0.1)
Basophils Relative: 0 %
Eosinophils Absolute: 0 10*3/uL (ref 0.0–0.5)
Eosinophils Relative: 0 %
HCT: 37.6 % (ref 36.0–46.0)
Hemoglobin: 12.4 g/dL (ref 12.0–15.0)
Immature Granulocytes: 1 %
Lymphocytes Relative: 13 %
Lymphs Abs: 1.3 10*3/uL (ref 0.7–4.0)
MCH: 29.5 pg (ref 26.0–34.0)
MCHC: 33 g/dL (ref 30.0–36.0)
MCV: 89.3 fL (ref 80.0–100.0)
Monocytes Absolute: 1.4 10*3/uL — ABNORMAL HIGH (ref 0.1–1.0)
Monocytes Relative: 14 %
Neutro Abs: 7.3 10*3/uL (ref 1.7–7.7)
Neutrophils Relative %: 72 %
Platelets: 232 10*3/uL (ref 150–400)
RBC: 4.21 MIL/uL (ref 3.87–5.11)
RDW: 15.7 % — ABNORMAL HIGH (ref 11.5–15.5)
WBC: 10.1 10*3/uL (ref 4.0–10.5)
nRBC: 0 % (ref 0.0–0.2)

## 2020-09-04 LAB — BRAIN NATRIURETIC PEPTIDE: B Natriuretic Peptide: 386.8 pg/mL — ABNORMAL HIGH (ref 0.0–100.0)

## 2020-09-04 LAB — TROPONIN I (HIGH SENSITIVITY)
Troponin I (High Sensitivity): 22 ng/L — ABNORMAL HIGH (ref <18)
Troponin I (High Sensitivity): 24 ng/L — ABNORMAL HIGH (ref <18)

## 2020-09-04 LAB — LIPASE, BLOOD: Lipase: 31 U/L (ref 11–51)

## 2020-09-04 MED ORDER — CLINDAMYCIN PHOSPHATE 300 MG/50ML IV SOLN
300.0000 mg | Freq: Once | INTRAVENOUS | Status: AC
  Administered 2020-09-04: 300 mg via INTRAVENOUS
  Filled 2020-09-04: qty 50

## 2020-09-04 NOTE — Progress Notes (Signed)
Pt transferred to [facility] 3e02. Pt arrived in NAD on RA. Left hand and arm visibly swollen, red, and warm to the touch. Pt A/O x2. Unable to complete admission documentation due to orientation status. Family aware of pt's transfer. Falls precautions in place and bed alarm on.

## 2020-09-04 NOTE — Plan of Care (Signed)
TR[facility] will assume care on arrival to accepting facility. Until arrival, care as per EDP. However, [facility] available 24/7 for questions and assistance.  

## 2020-09-04 NOTE — ED Provider Notes (Signed)
[facility] EMERGENCY DEPARTMENT Provider Note   CSN: [id number] Arrival date & time: 09/04/20  1234     History Chief Complaint  Patient presents with   Chest Pain   Hand Pain    Katherine Cervantes is a 87 y.o. female.  The history is provided by the patient and medical records. No language interpreter was used.  Chest Pain Pain location:  Substernal area and L chest Pain quality: aching, crushing, dull and pressure   Pain radiates to:  L shoulder and L arm Pain severity:  Severe Onset quality:  Gradual Duration:  1 day Timing:  Constant Progression:  Unchanged Chronicity:  New Relieved by:  Nothing Worsened by:  Nothing Ineffective treatments:  None tried Associated symptoms: fatigue, nausea and shortness of breath   Associated symptoms: no abdominal pain, no altered mental status, no anxiety, no back pain, no cough, no dizziness, no fever, no headache, no lower extremity edema, no near-syncope, no palpitations, no vomiting and no weakness   Hand Pain Associated symptoms include chest pain and shortness of breath. Pertinent negatives include no abdominal pain and no headaches. The symptoms are aggravated by exertion. Nothing relieves the symptoms. She has tried nothing for the symptoms. The treatment provided no relief.      Past Medical History:  Diagnosis Date   Anticoagulation adequate 05/22/2018   Asthma    Atrial fibrillation (HCC) 09/27/2011   CAD (coronary artery disease) 09/27/2011   Diabetes mellitus without complication (HCC)    Diverticulosis 11/04/2011   Dyslipidemia 09/27/2011   History of vasculitis 09/27/2011   Hypertension 09/27/2011   Incontinence 10/15/2015   Morbid obesity with BMI of 40.0-44.9, adult (HCC) 10/03/2016   Osteoporosis 09/27/2011   Pacemaker    PUD (peptic ulcer disease)    Rheumatoid arthritis(714.0) 09/27/2011   Short-term memory loss 10/03/2016   Sleep apnea 09/27/2011    Patient Active Problem List   Diagnosis Date Noted   SOB  (shortness of breath) on exertion 12/24/2019   Depressed mood 12/22/2019   Sinus node dysfunction (HCC) 11/10/2018   Venous insufficiency of both lower extremities 02/05/2018   Physical deconditioning 02/05/2018   Acute on chronic diastolic CHF (congestive heart failure) (HCC) 01/30/2018   Carpal tunnel syndrome, bilateral 12/09/2017   Rheumatoid arthritis (HCC) 12/09/2017   Non compliance w medication regimen 12/09/2017   Acute on chronic diastolic heart failure (HCC) 10/03/2016   HLD (hyperlipidemia) 10/03/2016   History of cardiac pacemaker 2/2 CHB 10/03/2016   Morbid obesity with BMI of 40.0-44.9, adult (HCC) 10/03/2016   Short-term memory loss 10/03/2016   Pressure injury of skin 10/03/2016   Incontinence 10/15/2015   Bullous rash 08/28/2015   Chronic anticoagulation 08/28/2015   Cerebrovascular disease 08/24/2014   Vasculitis (HCC) 04/12/2014   Neck mass 02/08/2013   Hyperlipidemia 01/13/2013   Type 2 diabetes mellitus (HCC) 11/30/2012   Diverticulosis 11/04/2011   History of (heterozygous) Hemochromatosis 10/10/2011   Nephrolithiasis - 0.3cm Right Ureter August 2013 10/07/2011   Shingles outbreak 10/01/2011   Dyslipidemia 09/27/2011   Essential hypertension 09/27/2011   CAD (coronary artery disease) 09/27/2011   Permanent atrial fibrillation (HCC) 09/27/2011   Sleep apnea 09/27/2011   History of gout 09/27/2011   Pacemaker 09/27/2011   Rheumatoid arthritis(714.0) 09/27/2011   Osteoporosis 09/27/2011    Past Surgical History:  Procedure Laterality Date   ABDOMINAL HYSTERECTOMY     ANKLE SURGERY     APPENDECTOMY     Carpal tunnel     CHOLECYSTECTOMY  PARTIAL GASTRECTOMY     PPM GENERATOR CHANGEOUT N/A 11/13/2018   Procedure: PPM GENERATOR CHANGEOUT;  Surgeon: Marinus Maw, MD;  Location: [facility] INVASIVE CV LAB;  Service: Cardiovascular;  Laterality: N/A;   REPLACEMENT TOTAL KNEE Bilateral      OB History   No obstetric history on file.     Family History   Problem Relation Age of Onset   Hypertension Mother    Cancer Brother        colon   Dementia Brother    Cancer Daughter        colon,breast    Social History   Tobacco Use   Smoking status: Former    Types: Cigarettes   Smokeless tobacco: Never   Tobacco comments:    quit 4 months ago  Vaping Use   Vaping Use: Never used  Substance Use Topics   Alcohol use: Yes    Alcohol/week: 0.0 standard drinks    Comment: Occasional   Drug use: No    Home Medications Prior to Admission medications   Medication Sig Start Date End Date Taking? Authorizing Provider  acetaminophen (TYLENOL) 325 MG tablet Take 2 tablets (650 mg total) by mouth every 4 (four) hours as needed for headache or mild pain. 05/22/18   Leone Brand, NP  albuterol (VENTOLIN HFA) 108 (90 Base) MCG/ACT inhaler Inhale 1-2 puffs into the lungs every 4 (four) hours as needed for wheezing or shortness of breath. 02/18/20   Tollie Eth, NP  clotrimazole (LOTRIMIN) 1 % cream Apply topically as needed. Apply topically to groin area, breast folds and inner thigh 2 times a day for yeast 03/21/20 03/21/21  [provider]  fluconazole (DIFLUCAN) 150 MG tablet Take 150 mg by mouth once a week. 03/15/20   [provider]  furosemide (LASIX) 80 MG tablet Take 1 tablet (80 mg total) by mouth 2 (two) times daily. 02/14/20   Marinus Maw, MD  gabapentin (NEURONTIN) 400 MG capsule Take 400 mg by mouth 3 (three) times daily. Take 2 capsules by mouth in the mornings at 7 AM daily    [provider]  metoprolol succinate (TOPROL-XL) 50 MG 24 hr tablet Take 1 tablet (50 mg total) by mouth daily. 03/08/20   Sunnie Nielsen, DO  nitroGLYCERIN (NITROSTAT) 0.4 MG SL tablet Place 1 tablet (0.4 mg total) under the tongue every 5 (five) minutes as needed for chest pain (MAX 3 TABLETS). 12/24/17   Jodelle Gross, NP  nystatin (MYCOSTATIN/NYSTOP) powder Apply topically 2 (two) times daily.    [provider]   potassium chloride (KLOR-CON) 10 MEQ tablet Take 10 mEq by mouth daily. Take 1 tablet by mouth in the evening for low iron    [provider]  rivaroxaban (XARELTO) 20 MG TABS tablet Take 1 tablet (20 mg total) by mouth daily. 12/31/19 03/30/20  Sunnie Nielsen, DO  spironolactone (ALDACTONE) 25 MG tablet Take 1 tablet (25 mg total) by mouth daily. 05/23/18   Leone Brand, NP  traMADol (ULTRAM) 50 MG tablet Take 1 tablet (50 mg total) by mouth every 6 (six) hours as needed for severe pain. 03/08/20   Sunnie Nielsen, DO    Allergies    Aspirin, Other, Penicillins, Codeine, Levofloxacin in d5w, Morphine and related, Sulfa antibiotics, Ether, Prednisone, and Procaine  Review of Systems   Review of Systems  Constitutional:  Positive for fatigue. Negative for chills and fever.  HENT:  Negative for congestion.   Eyes:  Negative for visual disturbance.  Respiratory:  Positive for shortness of breath. Negative for cough, chest tightness and wheezing.   Cardiovascular:  Positive for chest pain. Negative for palpitations and near-syncope.  Gastrointestinal:  Positive for nausea. Negative for abdominal pain, constipation, diarrhea and vomiting.  Genitourinary:  Negative for dysuria, flank pain and frequency.  Musculoskeletal:  Negative for back pain.  Skin:  Negative for rash and wound.  Neurological:  Positive for light-headedness. Negative for dizziness, weakness and headaches.  Psychiatric/Behavioral:  Negative for agitation and confusion.   All other systems reviewed and are negative.  Physical Exam Updated Vital Signs BP (!) 141/68 (BP Location: Right Arm)   Pulse 60   Temp 98.1 F (36.7 C)   Resp 18   Ht 5' (1.524 m)   Wt 95.6 kg   SpO2 92%   BMI 41.17 kg/m   Physical Exam Vitals and nursing note reviewed.  Constitutional:      General: She is not in acute distress.    Appearance: She is well-developed. She is not ill-appearing, toxic-appearing or diaphoretic.   HENT:     Head: Normocephalic and atraumatic.  Eyes:     Conjunctiva/sclera: Conjunctivae normal.     Pupils: Pupils are equal, round, and reactive to light.  Cardiovascular:     Rate and Rhythm: Normal rate and regular rhythm.     Heart sounds: Murmur heard.  Pulmonary:     Effort: Pulmonary effort is normal. No tachypnea or respiratory distress.     Breath sounds: Normal breath sounds. No decreased breath sounds, wheezing, rhonchi or rales.  Chest:     Chest wall: No tenderness.  Abdominal:     Palpations: Abdomen is soft.     Tenderness: There is no abdominal tenderness.  Musculoskeletal:     Cervical back: Neck supple.     Right lower leg: No tenderness. No edema.     Left lower leg: No tenderness. No edema.     Comments: Erythema and swelling on the left hand that stops before the distal fingers.  Less pain or tenderness in the wrist.  No snuffbox tenderness.  Normal sensation, strength, and pulses in the upper extremities.  No other edema in the arm per patient.  She does report history of gout in this location.  Skin:    General: Skin is warm and dry.     Findings: No erythema.  Neurological:     General: No focal deficit present.     Mental Status: She is alert.  Psychiatric:        Mood and Affect: Mood normal.    ED Results / Procedures / Treatments   Labs (all labs ordered are listed, but only abnormal results are displayed) Labs Reviewed  RESP PANEL BY RT-PCR (FLU A&B, COVID) [id number]  CBC WITH DIFFERENTIAL/PLATELET  COMPREHENSIVE METABOLIC PANEL  LIPASE, BLOOD  BRAIN NATRIURETIC PEPTIDE  TROPONIN I (HIGH SENSITIVITY)  TROPONIN I (HIGH SENSITIVITY)    EKG EKG Interpretation  Date/Time:  Monday September 04 2020 15:22:17 EDT Ventricular Rate:  60 PR Interval:    QRS Duration: 176 QT Interval:  504 QTC Calculation: 504 R Axis:   -89 Text Interpretation: Paced rhythm Similar paced rhythm. No STEMI Confirmed by Theda Belfast ([id number]) on 09/04/2020 3:24:59  PM  Radiology DG Chest 2 View  Result Date: 09/04/2020 CLINICAL DATA:  Chest pain. EXAM: CHEST - 2 VIEW COMPARISON:  May 23, 2020. FINDINGS: Stable cardiomegaly. Left-sided pacemaker is unchanged in position. No pneumothorax or  pleural effusion is noted. Lungs are clear. Old right rib fractures are noted. IMPRESSION: No active cardiopulmonary disease. Aortic Atherosclerosis (ICD10-I70.0). Electronically Signed   By: [name] M.D.   On: 09/04/2020 14:50   US Venous Img Upper Uni Left  Result Date: 09/04/2020 CLINICAL DATA:  87 year old with left hand and arm swelling. EXAM: LEFT UPPER EXTREMITY VENOUS DOPPLER ULTRASOUND TECHNIQUE: Gray-scale sonography with graded compression, as well as color Doppler and duplex ultrasound were performed to evaluate the upper extremity deep venous system from the level of the subclavian vein and including the jugular, axillary, basilic, radial, ulnar and upper cephalic vein. Spectral Doppler was utilized to evaluate flow at rest and with distal augmentation maneuvers. COMPARISON:  None. FINDINGS: Contralateral Subclavian Vein: No evidence of thrombus. Normal color Doppler flow. Internal Jugular Vein: No evidence of thrombus. Normal compressibility, color flow and phasicity. Subclavian Vein: No evidence of thrombus. Normal compressibility, color Doppler flow and phasicity. Axillary Vein: No evidence of thrombus. Normal compressibility, color Doppler flow and augmentation. Cephalic Vein: No evidence of thrombus. Normal compressibility and color Doppler flow. Basilic Vein: No evidence of thrombus. Normal compressibility and color Doppler flow. Brachial Veins: No evidence of thrombus. Normal compressibility and color Doppler flow. Radial Veins: No evidence of thrombus.  Normal compressibility. Ulnar Veins: No evidence of thrombus.  Normal compressibility. Other Findings:  None visualized. IMPRESSION: No evidence of DVT within the left upper extremity. Electronically  Signed   By: Richarda Overlie M.D.   On: 09/04/2020 15:04   DG Hand Complete Left  Result Date: 09/04/2020 CLINICAL DATA:  Left hand pain and swelling. EXAM: LEFT HAND - COMPLETE 3+ VIEW COMPARISON:  May 09, 2009. FINDINGS: No acute fracture or dislocation is noted. Resorption of the proximal portion of the scaphoid bone is noted most consistent with avascular necrosis from old injury. Mild degenerative changes seen involving the first carpometacarpal joint. Soft tissue swelling is seen involving the second and third fingers. Moderate degenerative changes seen involving the third metacarpophalangeal joint. IMPRESSION: Degenerative joint disease is seen involving the first carpometacarpal joint as well as the third metacarpophalangeal joint. Soft tissue swelling is seen involving the second and third fingers. 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 No acute fracture or dislocation is noted. Electronically Signed   By: [name] M.D.   On: 09/04/2020 14:54    Procedures Procedures   Medications Ordered in ED Medications - No data to display  ED Course  I have reviewed the triage vital signs and the nursing notes.  Pertinent labs & imaging results that were available during my care of the patient were reviewed by me and considered in my medical decision making (see chart for details).    MDM Rules/Calculators/A&P                           JASELLE PRYER is a 87 y.o. female with a past medical history significant for CAD, diabetes, hypertension, hyperlipidemia, atrial fibrillation on Xarelto, sinus node dysfunction with pacemaker, asthma, rheumatoid arthritis, prior gout, prior vasculitis, and sleep apnea who presents with chest pain.  She reports that she woke up this morning with severe pain in her central chest that radiates down her left arm.  It is exertional and associated with shortness of breath.  She reports some mild nausea but no vomiting.  She denies any  trauma, constipation, diarrhea.  Denies urinary changes.  Denies any new  leg pain or leg swelling and denies history of DVT or PE.  She reports that she had a history of gout in her hands and thinks that is why she is having several days of left hand redness, pain, and swelling.  She denies history of blood clot in her arms.  She reports that she was not diaphoretic but was lightheaded with her chest pain earlier.  She reports it is 9 out of 10 in severity.  She denies fevers, chills, congestion, or cough.  Denies any COVID exposures to her knowledge.  On exam, chest is nontender.  Patient has a mild murmur.  Lungs otherwise are clear with no rhonchi rales or wheezing.  She is able to move extremities.  Left hand is erythematous warm and tender to palpation in the hand but had intact sensation, strength, and pulses.  The redness is primarily confined to the knuckles and dorsum of the hand but does go extended to some of the fingers.  No other swelling or pain in the forearm or upper arm.  Back nontender.  Abdomen nontender.  Legs are nontender and nonedematous.  Good pulses in lower extremities.  EKG shows paced rhyuthm. No STEMI  Due to the patient's history of CAD with concerning chest pain, will get troponins, chest x-ray, and labs.  We will get ultrasound left upper extremity although have a low suspicion for DVT given her hand pain and swelling being similar to prior gout, her Xarelto use, and her lack of pain or swelling in the forearm or upper arm.  Will get x-ray of the hand as well and screening labs.  Anticipate reassessment after work-up to determine decision however if concerning findings are discovered, anticipate touch base with cardiology given her high risk chest pain and history of CAD with exertional chest pain.    Care transferred to oncoming team while waiting for results of diagnostic work-up for chest pain.  Anticipate disposition based on work-up results.   Final Clinical  Impression(s) / ED Diagnoses Final diagnoses:  Crushing chest pain  Left hand pain     Clinical Impression: 1. Crushing chest pain   2. Left hand pain     Disposition:  Care transferred to oncoming team while waiting for results of diagnostic work-up for chest pain.  Anticipate disposition based on work-up results.  This note was prepared with assistance of Conservation officer, historic buildings. Occasional wrong-word or sound-a-like substitutions may have occurred due to the inherent limitations of voice recognition software.     Melicia Esqueda, Canary Brim, MD 09/04/20 [phone]

## 2020-09-04 NOTE — ED Triage Notes (Signed)
Pt arrives EMS from [facility],  per ems Cp x 2 hours and hand pain  , pt denies cp at this time left hand swollen and hot to touch left index finger pain

## 2020-09-04 NOTE — ED Provider Notes (Signed)
Patient initially was seen by Dr. Rush Landmark.  Please see his note.  Patient presented with arm swelling as well as chest discomfort.  Patient's troponins are slightly elevated but they are stable.  Patient does have significant arm swelling.  There is no evidence of DVT on the Doppler study earlier.  Patient does continue to have persistent pain.  Will start on antibiotics for possible cellulitis of her arm.  Consult the medical service for further evaluation.   Linwood Dibbles, MD 09/04/20 [phone]

## 2020-09-05 DIAGNOSIS — J45909 Unspecified asthma, uncomplicated: Secondary | ICD-10-CM | POA: Diagnosis present

## 2020-09-05 DIAGNOSIS — R0789 Other chest pain: Secondary | ICD-10-CM

## 2020-09-05 DIAGNOSIS — E785 Hyperlipidemia, unspecified: Secondary | ICD-10-CM

## 2020-09-05 DIAGNOSIS — E119 Type 2 diabetes mellitus without complications: Secondary | ICD-10-CM | POA: Diagnosis present

## 2020-09-05 DIAGNOSIS — Z950 Presence of cardiac pacemaker: Secondary | ICD-10-CM

## 2020-09-05 DIAGNOSIS — I2510 Atherosclerotic heart disease of native coronary artery without angina pectoris: Secondary | ICD-10-CM | POA: Diagnosis present

## 2020-09-05 DIAGNOSIS — I4821 Permanent atrial fibrillation: Secondary | ICD-10-CM | POA: Diagnosis present

## 2020-09-05 DIAGNOSIS — Z6841 Body Mass Index (BMI) 40.0 and over, adult: Secondary | ICD-10-CM

## 2020-09-05 DIAGNOSIS — Z20822 Contact with and (suspected) exposure to covid-19: Secondary | ICD-10-CM | POA: Diagnosis present

## 2020-09-05 DIAGNOSIS — L03119 Cellulitis of unspecified part of limb: Secondary | ICD-10-CM | POA: Diagnosis present

## 2020-09-05 DIAGNOSIS — I5032 Chronic diastolic (congestive) heart failure: Secondary | ICD-10-CM | POA: Diagnosis present

## 2020-09-05 DIAGNOSIS — R079 Chest pain, unspecified: Secondary | ICD-10-CM | POA: Diagnosis present

## 2020-09-05 DIAGNOSIS — Z66 Do not resuscitate: Secondary | ICD-10-CM | POA: Diagnosis present

## 2020-09-05 DIAGNOSIS — L03114 Cellulitis of left upper limb: Principal | ICD-10-CM

## 2020-09-05 DIAGNOSIS — Z7901 Long term (current) use of anticoagulants: Secondary | ICD-10-CM | POA: Diagnosis not present

## 2020-09-05 DIAGNOSIS — M069 Rheumatoid arthritis, unspecified: Secondary | ICD-10-CM | POA: Diagnosis present

## 2020-09-05 DIAGNOSIS — I110 Hypertensive heart disease with heart failure: Secondary | ICD-10-CM | POA: Diagnosis present

## 2020-09-05 DIAGNOSIS — G4733 Obstructive sleep apnea (adult) (pediatric): Secondary | ICD-10-CM | POA: Diagnosis present

## 2020-09-05 DIAGNOSIS — I495 Sick sinus syndrome: Secondary | ICD-10-CM | POA: Diagnosis present

## 2020-09-05 DIAGNOSIS — I2583 Coronary atherosclerosis due to lipid rich plaque: Secondary | ICD-10-CM

## 2020-09-05 DIAGNOSIS — M810 Age-related osteoporosis without current pathological fracture: Secondary | ICD-10-CM | POA: Diagnosis present

## 2020-09-05 DIAGNOSIS — I872 Venous insufficiency (chronic) (peripheral): Secondary | ICD-10-CM

## 2020-09-05 DIAGNOSIS — Z8619 Personal history of other infectious and parasitic diseases: Secondary | ICD-10-CM | POA: Diagnosis not present

## 2020-09-05 DIAGNOSIS — Z28310 Unvaccinated for covid-19: Secondary | ICD-10-CM | POA: Diagnosis not present

## 2020-09-05 DIAGNOSIS — G4730 Sleep apnea, unspecified: Secondary | ICD-10-CM

## 2020-09-05 DIAGNOSIS — I10 Essential (primary) hypertension: Secondary | ICD-10-CM

## 2020-09-05 DIAGNOSIS — I878 Other specified disorders of veins: Secondary | ICD-10-CM | POA: Diagnosis present

## 2020-09-05 DIAGNOSIS — F32A Depression, unspecified: Secondary | ICD-10-CM | POA: Diagnosis present

## 2020-09-05 DIAGNOSIS — M79642 Pain in left hand: Secondary | ICD-10-CM | POA: Diagnosis not present

## 2020-09-05 DIAGNOSIS — Z8249 Family history of ischemic heart disease and other diseases of the circulatory system: Secondary | ICD-10-CM | POA: Diagnosis not present

## 2020-09-05 DIAGNOSIS — E871 Hypo-osmolality and hyponatremia: Secondary | ICD-10-CM | POA: Diagnosis present

## 2020-09-05 DIAGNOSIS — L0390 Cellulitis, unspecified: Secondary | ICD-10-CM | POA: Diagnosis present

## 2020-09-05 LAB — COMPREHENSIVE METABOLIC PANEL
ALT: 11 U/L (ref 0–44)
AST: 15 U/L (ref 15–41)
Albumin: 2.8 g/dL — ABNORMAL LOW (ref 3.5–5.0)
Alkaline Phosphatase: 39 U/L (ref 38–126)
Anion gap: 10 (ref 5–15)
BUN: 21 mg/dL (ref 8–23)
CO2: 29 mmol/L (ref 22–32)
Calcium: 8.8 mg/dL — ABNORMAL LOW (ref 8.9–10.3)
Chloride: 98 mmol/L (ref 98–111)
Creatinine, Ser: 0.78 mg/dL (ref 0.44–1.00)
GFR, Estimated: >60 mL/min (ref >60)
Glucose, Bld: 122 mg/dL — ABNORMAL HIGH (ref 70–99)
Potassium: 3 mmol/L — ABNORMAL LOW (ref 3.5–5.1)
Sodium: 137 mmol/L (ref 135–145)
Total Bilirubin: 1.5 mg/dL — ABNORMAL HIGH (ref 0.3–1.2)
Total Protein: 7.2 g/dL (ref 6.5–8.1)

## 2020-09-05 LAB — CBC WITH DIFFERENTIAL/PLATELET
Abs Immature Granulocytes: 0.04 10*3/uL (ref 0.00–0.07)
Basophils Absolute: 0 10*3/uL (ref 0.0–0.1)
Basophils Relative: 0 %
Eosinophils Absolute: 0.1 10*3/uL (ref 0.0–0.5)
Eosinophils Relative: 1 %
HCT: 38.7 % (ref 36.0–46.0)
Hemoglobin: 12.9 g/dL (ref 12.0–15.0)
Immature Granulocytes: 1 %
Lymphocytes Relative: 12 %
Lymphs Abs: 1 10*3/uL (ref 0.7–4.0)
MCH: 29.9 pg (ref 26.0–34.0)
MCHC: 33.3 g/dL (ref 30.0–36.0)
MCV: 89.8 fL (ref 80.0–100.0)
Monocytes Absolute: 1.2 10*3/uL — ABNORMAL HIGH (ref 0.1–1.0)
Monocytes Relative: 15 %
Neutro Abs: 6 10*3/uL (ref 1.7–7.7)
Neutrophils Relative %: 71 %
Platelets: 230 10*3/uL (ref 150–400)
RBC: 4.31 MIL/uL (ref 3.87–5.11)
RDW: 15.5 % (ref 11.5–15.5)
WBC: 8.3 10*3/uL (ref 4.0–10.5)
nRBC: 0 % (ref 0.0–0.2)

## 2020-09-05 LAB — MAGNESIUM: Magnesium: 1.9 mg/dL (ref 1.7–2.4)

## 2020-09-05 LAB — MRSA NEXT GEN BY PCR, NASAL: MRSA by PCR Next Gen: NOT DETECTED

## 2020-09-05 LAB — GLUCOSE, CAPILLARY
Glucose-Capillary: 111 mg/dL — ABNORMAL HIGH (ref 70–99)
Glucose-Capillary: 132 mg/dL — ABNORMAL HIGH (ref 70–99)
Glucose-Capillary: 135 mg/dL — ABNORMAL HIGH (ref 70–99)
Glucose-Capillary: 135 mg/dL — ABNORMAL HIGH (ref 70–99)
Glucose-Capillary: 165 mg/dL — ABNORMAL HIGH (ref 70–99)
Glucose-Capillary: 176 mg/dL — ABNORMAL HIGH (ref 70–99)

## 2020-09-05 LAB — TSH: TSH: 3.189 u[IU]/mL (ref 0.350–4.500)

## 2020-09-05 LAB — TROPONIN I (HIGH SENSITIVITY)
Troponin I (High Sensitivity): 18 ng/L — ABNORMAL HIGH (ref <18)
Troponin I (High Sensitivity): 18 ng/L — ABNORMAL HIGH (ref <18)

## 2020-09-05 LAB — PHOSPHORUS: Phosphorus: 2.9 mg/dL (ref 2.5–4.6)

## 2020-09-05 MED ORDER — RIVAROXABAN 20 MG PO TABS
20.0000 mg | ORAL_TABLET | Freq: Every day | ORAL | Status: DC
  Administered 2020-09-05 – 2020-09-09 (×5): 20 mg via ORAL
  Filled 2020-09-05 (×5): qty 1

## 2020-09-05 MED ORDER — GABAPENTIN 400 MG PO CAPS
400.0000 mg | ORAL_CAPSULE | Freq: Three times a day (TID) | ORAL | Status: DC
  Administered 2020-09-05 – 2020-09-09 (×13): 400 mg via ORAL
  Filled 2020-09-05 (×13): qty 1

## 2020-09-05 MED ORDER — SODIUM CHLORIDE 0.9% FLUSH: 3.0000 mL | INTRAVENOUS | Status: DC | PRN

## 2020-09-05 MED ORDER — POTASSIUM CHLORIDE CRYS ER 20 MEQ PO TBCR
20.0000 meq | EXTENDED_RELEASE_TABLET | Freq: Once | ORAL | Status: AC
  Administered 2020-09-05: 20 meq via ORAL
  Filled 2020-09-05: qty 1

## 2020-09-05 MED ORDER — METOPROLOL SUCCINATE ER 50 MG PO TB24
50.0000 mg | ORAL_TABLET | Freq: Every day | ORAL | Status: DC
  Administered 2020-09-05 – 2020-09-09 (×5): 50 mg via ORAL
  Filled 2020-09-05 (×5): qty 1

## 2020-09-05 MED ORDER — POTASSIUM CHLORIDE CRYS ER 20 MEQ PO TBCR
40.0000 meq | EXTENDED_RELEASE_TABLET | Freq: Once | ORAL | Status: AC
  Administered 2020-09-05: 40 meq via ORAL
  Filled 2020-09-05: qty 2

## 2020-09-05 MED ORDER — VANCOMYCIN HCL 1250 MG/250ML IV SOLN
1250.0000 mg | Freq: Once | INTRAVENOUS | Status: AC
  Administered 2020-09-05: 1250 mg via INTRAVENOUS
  Filled 2020-09-05: qty 250

## 2020-09-05 MED ORDER — FUROSEMIDE 80 MG PO TABS
80.0000 mg | ORAL_TABLET | Freq: Two times a day (BID) | ORAL | Status: DC
  Administered 2020-09-05: 80 mg via ORAL
  Filled 2020-09-05: qty 1

## 2020-09-05 MED ORDER — ALBUTEROL SULFATE (2.5 MG/3ML) 0.083% IN NEBU: 2.5000 mg | INHALATION_SOLUTION | RESPIRATORY_TRACT | Status: DC | PRN

## 2020-09-05 MED ORDER — FENTANYL CITRATE (PF) 100 MCG/2ML IJ SOLN
50.0000 ug | INTRAMUSCULAR | Status: DC | PRN
  Administered 2020-09-05 – 2020-09-06 (×3): 50 ug via INTRAVENOUS
  Filled 2020-09-05 (×3): qty 2

## 2020-09-05 MED ORDER — SODIUM CHLORIDE 0.9 % IV SOLN
250.0000 mL | INTRAVENOUS | Status: DC | PRN
  Administered 2020-09-06: 250 mL via INTRAVENOUS

## 2020-09-05 MED ORDER — INSULIN ASPART 100 UNIT/ML IJ SOLN
0.0000 [IU] | INTRAMUSCULAR | Status: DC
  Administered 2020-09-05 (×2): 1 [IU] via SUBCUTANEOUS
  Administered 2020-09-05: 2 [IU] via SUBCUTANEOUS
  Administered 2020-09-05: 1 [IU] via SUBCUTANEOUS
  Administered 2020-09-05 – 2020-09-07 (×3): 2 [IU] via SUBCUTANEOUS
  Administered 2020-09-07 (×3): 1 [IU] via SUBCUTANEOUS
  Administered 2020-09-08: 2 [IU] via SUBCUTANEOUS
  Administered 2020-09-08 (×2): 1 [IU] via SUBCUTANEOUS
  Administered 2020-09-08 – 2020-09-09 (×2): 2 [IU] via SUBCUTANEOUS
  Administered 2020-09-09 (×2): 1 [IU] via SUBCUTANEOUS

## 2020-09-05 MED ORDER — SPIRONOLACTONE 25 MG PO TABS
25.0000 mg | ORAL_TABLET | Freq: Every day | ORAL | Status: DC
  Administered 2020-09-05 – 2020-09-09 (×5): 25 mg via ORAL
  Filled 2020-09-05 (×5): qty 1

## 2020-09-05 MED ORDER — FUROSEMIDE 10 MG/ML IJ SOLN
40.0000 mg | Freq: Two times a day (BID) | INTRAMUSCULAR | Status: DC
  Administered 2020-09-05: 40 mg via INTRAVENOUS
  Filled 2020-09-05 (×2): qty 4

## 2020-09-05 MED ORDER — TRAMADOL HCL 50 MG PO TABS
50.0000 mg | ORAL_TABLET | Freq: Four times a day (QID) | ORAL | Status: DC | PRN
  Administered 2020-09-05 – 2020-09-09 (×6): 50 mg via ORAL
  Filled 2020-09-05 (×6): qty 1

## 2020-09-05 MED ORDER — ACETAMINOPHEN 325 MG PO TABS
650.0000 mg | ORAL_TABLET | Freq: Four times a day (QID) | ORAL | Status: DC | PRN
  Administered 2020-09-07: 650 mg via ORAL
  Filled 2020-09-05 (×2): qty 2

## 2020-09-05 MED ORDER — SODIUM CHLORIDE 0.9% FLUSH
3.0000 mL | Freq: Two times a day (BID) | INTRAVENOUS | Status: DC
  Administered 2020-09-05 – 2020-09-09 (×10): 3 mL via INTRAVENOUS

## 2020-09-05 MED ORDER — VANCOMYCIN HCL 750 MG/150ML IV SOLN
750.0000 mg | INTRAVENOUS | Status: DC
  Administered 2020-09-06 – 2020-09-09 (×4): 750 mg via INTRAVENOUS
  Filled 2020-09-05 (×5): qty 150

## 2020-09-05 MED ORDER — ACETAMINOPHEN 650 MG RE SUPP: 650.0000 mg | Freq: Four times a day (QID) | RECTAL | Status: DC | PRN

## 2020-09-05 NOTE — Progress Notes (Signed)
Pt can get easily irritated by asking simple questions like asking for pain levels or checking vitals. We tried to convince her to get vitals with son's help this afternoon.

## 2020-09-05 NOTE — H&P (Signed)
Katherine Cervantes ZOX:[id number] DOB: 16-May-1929 DOA: 09/04/2020     PCP: Sunnie Nielsen, DO   Outpatient Specialists:   CARDS:   Dr. Jari Sportsman    Patient arrived to ER on 09/04/20 at 1234 Referred by Attending No att. providers found   Patient coming from:   From facility  [facility]  Chief Complaint:   Chief Complaint  Patient presents with   Chest Pain   Hand Pain    HPI: Katherine Cervantes is a 87 y.o. female with medical history significant of chronic diastolic CHF, permanent atrial fibrillation, anxiety/depression, sinus node dysfunction sp cardiac pacemaker,   HTN, DM2, obesity, venous stasis chronic leg edema, recent shingles, asthma  Presented with   severe CP for 2 h and left hand red and swollen, hot to the touch Associated fatigue and nausea.  Patient endorsed shortness of breath but she does have chronic shortness of breath.  Seems to be worse with exertion.  She was also feeling somewhat lightheaded. She had shingles on right shoulder and has recurrent pain she takes gabapentin and lidocaine patches  Reports more of the left side pain now it has been ongoing all day and seems worse with palpation   Left hand started to swell 3 days ago and started to hurt her , no injury, no bug bites  slept in a recliner for the last 4 or more years due to pain have DOE  at her baseline  She was supposed to have a stress test done in the near future has not tolerated it well in the past  Family reports some diarrhea  PT reports she may have bumped her hand but she is unsure Has  NOt been vaccinated against COVID     Initial COVID TEST  NEGATIVE   Lab Results  Component Value Date   SARSCOV2NAA NEGATIVE 09/04/2020   SARSCOV2NAA NOT DETECTED 11/10/2018     Regarding pertinent Chronic problems:   Hyperlipidemia -  not on statins Lipid Panel     Component Value Date/Time   CHOL 157 12/24/2017 0915   TRIG 78 12/24/2017 0915   HDL 63 12/24/2017 0915    CHOLHDL 2.5 12/24/2017 0915   CHOLHDL 3.2 10/13/2015 1115   VLDL 28 10/13/2015 1115   LDLCALC 78 12/24/2017 0915   LABVLDL 16 12/24/2017 0915     HTN on toprol   chronic CHF  systolic - last echo 07/28/20 Possible hypokinesis of the apex  Left ventricular ejection fraction, by estimation, is 55 to 60%. On Lasix 80 bid, spironolactone    CAD  -   not on aspirin due to allergies, not on statin,  On betablocker, only                -  followed by cardiology                   DM 2 -  Lab Results  Component Value Date   HGBA1C 6.5 (H) 12/31/2019    diet controlled      Morbid obesity-   BMI Readings from Last 1 Encounters:  09/04/20 41.17 kg/m       Asthma -well   controlled on home inhalers/ nebs        OSA -not on CPAP     A. Fib -  - CHA2DS2 vas score >3     current  on anticoagulation with  Xarelto,            -  Rate control:  Currently controlled with  Toprolol,       While in ER: Noted to have left hand erythema and swelling normal pulses and sensation Patient did have gout in the same location before. Started on antibiotics clindamycin   ED Triage Vitals  Enc Vitals Group     BP 09/04/20 1237 (!) 141/68     Pulse Rate 09/04/20 1237 60     Resp 09/04/20 1237 18     Temp 09/04/20 1237 98.1 F (36.7 C)     Temp Source 09/04/20 2213 Oral     SpO2 09/04/20 1237 92 %     Weight 09/04/20 1238 210 lb 12.8 oz (95.6 kg)     Height 09/04/20 1238 5' (1.524 m)     Head Circumference --      Peak Flow --      Pain Score 09/04/20 1241 6     Pain Loc --      Pain Edu? --      Excl. in GC? --   TMAX(24)@     _________________________________________ Significant initial  Findings: Abnormal Labs Reviewed  CBC WITH DIFFERENTIAL/PLATELET - Abnormal; Notable for the following components:      Result Value   RDW 15.7 (*)    Monocytes Absolute 1.4 (*)    All other components within normal limits  COMPREHENSIVE METABOLIC PANEL - Abnormal; Notable for the following  components:   Potassium 3.2 (*)    Chloride 95 (*)    Glucose, Bld 127 (*)    BUN 25 (*)    Calcium 8.7 (*)    Albumin 3.1 (*)    All other components within normal limits  BRAIN NATRIURETIC PEPTIDE - Abnormal; Notable for the following components:   B Natriuretic Peptide 386.8 (*)    All other components within normal limits  TROPONIN I (HIGH SENSITIVITY) - Abnormal; Notable for the following components:   Troponin I (High Sensitivity) 22 (*)    All other components within normal limits  TROPONIN I (HIGH SENSITIVITY) - Abnormal; Notable for the following components:   Troponin I (High Sensitivity) 24 (*)    All other components within normal limits   ____________________________________________ Ordered    CXR -  NON acute  Had imaging showing degenerative joint disease nonacute  Doppler negative for DVT _________________________ Troponin 22-24 ECG: Ordered Personally reviewed by me showing: HR : 60 Rhythm:  Paced   QTC 504 ________    The recent clinical data is shown below. Vitals:   09/04/20 1523 09/04/20 1900 09/04/20 2213 09/05/20 0000  BP: (!) 145/79 (!) 148/54 138/63 110/65  Pulse: (!) 59 (!) 59 60 60  Resp: (!) 26 20 (!) 23 19  Temp:   97.7 F (36.5 C) 97.7 F (36.5 C)  TempSrc:   Oral Oral  SpO2: 99% 96% 94% 94%  Weight:      Height:        WBC     Component Value Date/Time   WBC 10.1 09/04/2020 1600   LYMPHSABS 1.3 09/04/2020 1600   LYMPHSABS 1.2 11/10/2018 0950   MONOABS 1.4 (H) 09/04/2020 1600   EOSABS 0.0 09/04/2020 1600   EOSABS 0.1 11/10/2018 0950   BASOSABS 0.0 09/04/2020 1600   BASOSABS 0.0 11/10/2018 0950       UA not ordered   Urine analysis:    Component Value Date/Time   COLORURINE YELLOW 05/23/2020 2000   APPEARANCEUR CLEAR 05/23/2020 2000   LABSPEC 1.010 05/23/2020 2000  PHURINE 7.0 05/23/2020 2000   GLUCOSEU NEGATIVE 05/23/2020 2000   HGBUR NEGATIVE 05/23/2020 2000   BILIRUBINUR NEGATIVE 05/23/2020 2000    BILIRUBINUR negative 10/28/2017 0939   BILIRUBINUR NEGATIVE 07/04/2016 0845   KETONESUR NEGATIVE 05/23/2020 2000   PROTEINUR NEGATIVE 05/23/2020 2000   UROBILINOGEN 1.0 10/28/2017 0939   NITRITE NEGATIVE 05/23/2020 2000   LEUKOCYTESUR NEGATIVE 05/23/2020 2000    Results for orders placed or performed during the hospital encounter of 09/04/20  Resp Panel by RT-PCR (Flu A&B, Covid) Nasopharyngeal Swab     Status: None   Collection Time: 09/04/20  3:19 PM   Specimen: Nasopharyngeal Swab; Nasopharyngeal(NP) swabs in vial transport medium  Result Value Ref Range Status   SARS Coronavirus 2 by RT PCR NEGATIVE NEGATIVE Final         Influenza A by PCR NEGATIVE NEGATIVE Final   Influenza B by PCR NEGATIVE NEGATIVE Final          _______________________________________________ Hospitalist was called for admission for left hand cellulitis and cp  The following Work up has been ordered so far:  Orders Placed This Encounter  Procedures   Resp Panel by RT-PCR (Flu A&B, Covid) Nasopharyngeal Swab   US Venous Img Upper Uni Left   DG Chest 2 View   DG Hand Complete Left   CBC with Differential   Comprehensive metabolic panel   Lipase, blood   Brain natriuretic peptide   Cardiac monitoring   Cardiac monitoring   Consult to hospitalist   Airborne and Contact precautions   ED EKG   EKG 12-Lead   Place in observation (patient's expected length of stay will be less than 2 midnights)      Following Medications were ordered in ER: Medications  clindamycin (CLEOCIN) IVPB 300 mg (0 mg Intravenous Stopped 09/04/20 1938)        Consult Orders  (From admission, onward)           Start     Ordered   09/04/20 1840  Consult to hospitalist  Called [facility] spoke with Selena Batten  Once       Provider:  (Not yet assigned)  Question Answer Comment  Place call to: [facility]   Reason for Consult Admit      09/04/20 1839              OTHER Significant initial  Findings:  labs  showing:  Recent Labs  Lab 09/04/20 1600  NA 136  K 3.2*  CO2 31  GLUCOSE 127*  BUN 25*  CREATININE 0.89  CALCIUM 8.7*    Cr    stable,    Lab Results  Component Value Date   CREATININE 0.89 09/04/2020   CREATININE 0.87 05/23/2020   CREATININE 0.93 05/23/2020    Recent Labs  Lab 09/04/20 1600  AST 16  ALT 10  ALKPHOS 47  BILITOT 0.9  PROT 7.8  ALBUMIN 3.1*   Lab Results  Component Value Date   CALCIUM 8.7 (L) 09/04/2020       Plt: Lab Results  Component Value Date   PLT 232 09/04/2020      COVID-19 Labs  No results for input(s): DDIMER, FERRITIN, LDH, CRP in the last 72 hours.  Lab Results  Component Value Date   SARSCOV2NAA NEGATIVE 09/04/2020   SARSCOV2NAA NOT DETECTED 11/10/2018        Recent Labs  Lab 09/04/20 1600  WBC 10.1  NEUTROABS 7.3  HGB 12.4  HCT 37.6  MCV 89.3  PLT  232    HG/HCT * stable,  Down *Up from baseline see below    Component Value Date/Time   HGB 12.4 09/04/2020 1600   HGB 13.5 11/10/2018 0950   HCT 37.6 09/04/2020 1600   HCT 39.7 11/10/2018 0950   MCV 89.3 09/04/2020 1600   MCV 92 11/10/2018 0950      Recent Labs  Lab 09/04/20 1600  LIPASE 31   No results for input(s): AMMONIA in the last 168 hours.   Cardiac Panel (last 3 results) No results for input(s): CKTOTAL, CKMB, TROPONINI, RELINDX in the last 72 hours.   BNP (last 3 results) Recent Labs    12/31/19 1340 02/23/20 1202 09/04/20 1600  BNP 257* 215* 386.8*      DM  labs:  HbA1C: Recent Labs    12/31/19 1340  HGBA1C 6.5*       CBG (last 3)  No results for input(s): GLUCAP in the last 72 hours.        Cultures:    Component Value Date/Time   SDES  05/23/2020 2000    URINE, CATHETERIZED Performed at [facility], [address] [place], [place] [address]    [facility]  05/23/2020 2000    NONE Performed at [facility], [address], [place], [place] [address]    CULT  05/23/2020 2000    NO  GROWTH Performed at [facility], [address], [place], [place] [address]    REPTSTATUS 05/25/2020 FINAL 05/23/2020 2000     Radiological Exams on Admission: DG Chest 2 View  Result Date: 09/04/2020 CLINICAL DATA:  Chest pain. EXAM: CHEST - 2 VIEW COMPARISON:  May 23, 2020. FINDINGS: Stable cardiomegaly. Left-sided pacemaker is unchanged in position. No pneumothorax or pleural effusion is noted. Lungs are clear. Old right rib fractures are noted. IMPRESSION: No active cardiopulmonary disease. Aortic Atherosclerosis (ICD10-I70.0). Electronically Signed   By: [name] M.D.   On: 09/04/2020 14:50   US Venous Img Upper Uni Left  Result Date: 09/04/2020 CLINICAL DATA:  87 year old with left hand and arm swelling. EXAM: LEFT UPPER EXTREMITY VENOUS DOPPLER ULTRASOUND TECHNIQUE: Gray-scale sonography with graded compression, as well as color Doppler and duplex ultrasound were performed to evaluate the upper extremity deep venous system from the level of the subclavian vein and including the jugular, axillary, basilic, radial, ulnar and upper cephalic vein. Spectral Doppler was utilized to evaluate flow at rest and with distal augmentation maneuvers. COMPARISON:  None. FINDINGS: Contralateral Subclavian Vein: No evidence of thrombus. Normal color Doppler flow. Internal Jugular Vein: No evidence of thrombus. Normal compressibility, color flow and phasicity. Subclavian Vein: No evidence of thrombus. Normal compressibility, color Doppler flow and phasicity. Axillary Vein: No evidence of thrombus. Normal compressibility, color Doppler flow and augmentation. Cephalic Vein: No evidence of thrombus. Normal compressibility and color Doppler flow. Basilic Vein: No evidence of thrombus. Normal compressibility and color Doppler flow. Brachial Veins: No evidence of thrombus. Normal compressibility and color Doppler flow. Radial Veins: No evidence of thrombus.  Normal compressibility. Ulnar Veins: No  evidence of thrombus.  Normal compressibility. Other Findings:  None visualized. IMPRESSION: No evidence of DVT within the left upper extremity. Electronically Signed   By: Richarda Overlie M.D.   On: 09/04/2020 15:04   DG Hand Complete Left  Result Date: 09/04/2020 CLINICAL DATA:  Left hand pain and swelling. EXAM: LEFT HAND - COMPLETE 3+ VIEW COMPARISON:  May 09, 2009. FINDINGS: No acute fracture or  dislocation is noted. Resorption of the proximal portion of the scaphoid bone is noted most consistent with avascular necrosis from old injury. Mild degenerative changes seen involving the first carpometacarpal joint. Soft tissue swelling is seen involving the second and third fingers. Moderate degenerative changes seen involving the third metacarpophalangeal joint. IMPRESSION: Degenerative joint disease is seen involving the first carpometacarpal joint as well as the third metacarpophalangeal joint. Soft tissue swelling is seen involving the second and third fingers. 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 No acute fracture or dislocation is noted. Electronically Signed   By: [name] M.D.   On: 09/04/2020 14:54   _______________________________________________________________________________________________________ Latest  Blood pressure 110/65, pulse 60, temperature 97.7 F (36.5 C), temperature source Oral, resp. rate 19, height 5' (1.524 m), weight 95.6 kg, SpO2 94 %.   Review of Systems:    Pertinent dyspnea on exertion, positives include:  chest pain, shortness of breath at rest   Constitutional:  No weight loss, night sweats, Fevers, chills, fatigue, weight loss  HEENT:  No headaches, Difficulty swallowing,Tooth/dental problems,Sore throat,  No sneezing, itching, ear ache, nasal congestion, post nasal drip,  Cardio-vascular:  No  Orthopnea, PND, anasarca, dizziness, palpitations.no Bilateral lower extremity swelling  GI:  No heartburn, indigestion, abdominal  pain, nausea, vomiting, diarrhea, change in bowel habits, loss of appetite, melena, blood in stool, hematemesis Resp:    No excess mucus, no productive cough, No non-productive cough, No coughing up of blood.No change in color of mucus.No wheezing. Skin:  no rash or lesions. No jaundice GU:  no dysuria, change in color of urine, no urgency or frequency. No straining to urinate.  No flank pain.  Musculoskeletal:  No joint pain or no joint swelling. No decreased range of motion. No back pain.  Psych:  No change in mood or affect. No depression or anxiety. No memory loss.  Neuro: no localizing neurological complaints, no tingling, no weakness, no double vision, no gait abnormality, no slurred speech, no confusion  All systems reviewed and apart from HOPI all are negative _______________________________________________________________________________________________ Past Medical History:   Past Medical History:  Diagnosis Date   Anticoagulation adequate 05/22/2018   Asthma    Atrial fibrillation (HCC) 09/27/2011   CAD (coronary artery disease) 09/27/2011   Diabetes mellitus without complication (HCC)    Diverticulosis 11/04/2011   Dyslipidemia 09/27/2011   History of vasculitis 09/27/2011   Hypertension 09/27/2011   Incontinence 10/15/2015   Morbid obesity with BMI of 40.0-44.9, adult (HCC) 10/03/2016   Osteoporosis 09/27/2011   Pacemaker    PUD (peptic ulcer disease)    Rheumatoid arthritis(714.0) 09/27/2011   Short-term memory loss 10/03/2016   Sleep apnea 09/27/2011    Past Surgical History:  Procedure Laterality Date   ABDOMINAL HYSTERECTOMY     ANKLE SURGERY     APPENDECTOMY     Carpal tunnel     CHOLECYSTECTOMY     PARTIAL GASTRECTOMY     PPM GENERATOR CHANGEOUT N/A 11/13/2018   Procedure: PPM GENERATOR CHANGEOUT;  Surgeon: Marinus Maw, MD;  Location: [facility];  Service: Cardiovascular;  Laterality: N/A;   REPLACEMENT TOTAL KNEE Bilateral     Social  History:  Ambulatory   walker        reports that she has quit smoking. Her smoking use included cigarettes. She has never used smokeless tobacco. She reports current alcohol use. She reports that she does not use drugs.     Family History:  Family History  Problem Relation Age of Onset   Hypertension Mother    Cancer Brother        colon   Dementia Brother    Cancer Daughter        colon,breast   ______________________________________________________________________________________________ Allergies: Allergies  Allergen Reactions   Aspirin Anaphylaxis   Other Anaphylaxis   Penicillins Anaphylaxis    Anaphylaxis   Codeine Other (See Comments)    SOB Asthmatic issues   Levofloxacin In D5w Other (See Comments)    Disoriented, Hallucinations Other reaction(s): Other Disoriented, Hallucinations   Morphine And Related Other (See Comments)    Anaphylaxis   Sulfa Antibiotics Rash   Ether    Prednisone Other (See Comments)    Lethargic, rash   Procaine      Prior to Admission medications   Medication Sig Start Date End Date Taking? Authorizing Provider  acetaminophen (TYLENOL) 325 MG tablet Take 2 tablets (650 mg total) by mouth every 4 (four) hours as needed for headache or mild pain. 05/22/18   Leone Brand, NP  albuterol (VENTOLIN HFA) 108 (90 Base) MCG/ACT inhaler Inhale 1-2 puffs into the lungs every 4 (four) hours as needed for wheezing or shortness of breath. 02/18/20   Tollie Eth, NP  clotrimazole (LOTRIMIN) 1 % cream Apply topically as needed. Apply topically to groin area, breast folds and inner thigh 2 times a day for yeast 03/21/20 03/21/21  [provider]  fluconazole (DIFLUCAN) 150 MG tablet Take 150 mg by mouth once a week. 03/15/20   [provider]  furosemide (LASIX) 80 MG tablet Take 1 tablet (80 mg total) by mouth 2 (two) times daily. 02/14/20   Marinus Maw, MD  gabapentin (NEURONTIN) 400 MG capsule Take 400 mg by mouth 3 (three) times  daily. Take 2 capsules by mouth in the mornings at 7 AM daily    [provider]  metoprolol succinate (TOPROL-XL) 50 MG 24 hr tablet Take 1 tablet (50 mg total) by mouth daily. 03/08/20   Sunnie Nielsen, DO  nitroGLYCERIN (NITROSTAT) 0.4 MG SL tablet Place 1 tablet (0.4 mg total) under the tongue every 5 (five) minutes as needed for chest pain (MAX 3 TABLETS). 12/24/17   Jodelle Gross, NP  nystatin (MYCOSTATIN/NYSTOP) powder Apply topically 2 (two) times daily.    [provider]  potassium chloride (KLOR-CON) 10 MEQ tablet Take 10 mEq by mouth daily. Take 1 tablet by mouth in the evening for low iron    [provider]  rivaroxaban (XARELTO) 20 MG TABS tablet Take 1 tablet (20 mg total) by mouth daily. 12/31/19 03/30/20  Sunnie Nielsen, DO  spironolactone (ALDACTONE) 25 MG tablet Take 1 tablet (25 mg total) by mouth daily. 05/23/18   Leone Brand, NP  traMADol (ULTRAM) 50 MG tablet Take 1 tablet (50 mg total) by mouth every 6 (six) hours as needed for severe pain. 03/08/20   Sunnie Nielsen, DO    ___________________________________________________________________________________________________ Physical Exam: Vitals with BMI 09/05/2020 09/04/2020 09/04/2020  Height - - -  Weight - - -  BMI - - -  Systolic 110 138 696  Diastolic 65 63 54  Pulse 60 60 59     1. General:  in No  Acute distress   Chronically ill   -appearing 2. Psychological: Alert and   Oriented 3. Head/ENT:   Moist  Mucous Membranes  Head Non traumatic, neck supple                           Poor Dentition 4. SKIN: normal  Skin turgor,  Skin clean Dry and intact no rash    5. Heart: Regular rate and rhythm systolic Murmur, no Rub or gallop 6. Lungs:  o wheezes or crackles   7. Abdomen: Soft,  non-tender, Non distended   obese  bowel sounds present 8. Lower extremities: no clubbing, cyanosis, no  edema 9. Neurologically Grossly intact, moving all 4  extremities equally   10. MSK: Normal range of motion    Chart has been reviewed  ______________________________________________________________________________________________  Assessment/Plan 87 y.o. female with medical history significant of chronic diastolic CHF, permanent atrial fibrillation, anxiety/depression, sinus node dysfunction sp cardiac pacemaker,   HTN, DM2, obesity, venous stasis chronic leg edema, recent shingles, asthma   Admitted for Cp and cellulitis  Present on Admission:  Chest pain -somewhat atypical patient has history of recurrent chest pain secondary to recent shingles outbreak.  Troponin stable continue to monitor continue monitor on telemetry.  Patient have had recent echogram.  She is followed by cardiology was supposed to get a stress test done.  Will notify them of patient's been admitted Continue home medications for neurological pain   Cellulitis of hand - -admit per  cellulitis protocol will      change to vancomycin patient with multiple allergies            plain films showed: no evidence of air  no evidence of osteomyelitis   no   foreign   objects No evidence of DVT       Will obtain MRSA screening,        further antibiotic adjustment pending above results   Venous insufficiency of both lower extremities chronic stable   Sleep apnea -patient is not on CPAP   Permanent atrial fibrillation (HCC) -continue Xarelto and Toprol   Pacemaker -chronic status post pacemaker for sick sinus syndrome   Hyperlipidemia -not on statin    Essential hypertension -stable continue home medication   CAD (coronary artery disease)-continue beta-blocker patient allergy: To aspirin   Chronic diastolic CHF (congestive heart failure) (HCC) -appears to be euvolemic continue Lasix    Other plan as per orders.  DVT prophylaxis:  Xarelto   Code Status:    Code Status: Prior DNR/DNI  as per  family  I had personally discussed CODE STATUS with  family*     Family Communication:   Family not at  Bedside  plan of care was discussed on the phone with   Daughter   Disposition Plan:                              Back to current facility when stable                              Following barriers for discharge:                            Electrolytes corrected  Pain controlled with PO medications                                                            Will need to be able to tolerate PO                            Will likely need home health, home O2, set up                           Will need consultants to evaluate patient prior to discharge                      Would benefit from PT/OT eval prior to DC  Ordered                   Swallow eval - SLP ordered                                       Consults called:  email cardiology    Admission status:  ED Disposition     ED Disposition  Admit   Condition  --   Comment  Hospital Area: [facility] [[id number]]  Level of Care: Telemetry Cardiac [[id number]]  Interfacility transfer: Yes  May place patient in observation at [facility] or [facility] if equivalent level of care is available:: Yes  Covid Evaluation: Asymptomatic Screening Protocol (No Symptoms)  Diagnosis: Chest pain [[id number]]  Admitting Physician: John Giovanni [[id number]]  Attending Physician: Linwood Dibbles [[id number]]           Obs    Level of care  tele  For   24H         Lab Results  Component Value Date   SARSCOV2NAA NEGATIVE 09/04/2020     Precautions: admitted as   Covid Negative     PPE: Used by the provider:   N95  eye Goggles,  Gloves    Katherine Cervantes 09/05/2020, 2:22 AM    [facility]     after 2 AM please page floor coverage PA If 7AM-7PM, please contact the day team taking care of the patient using [email or web address]   Patient was evaluated in the context of the global COVID-19 pandemic, which necessitated  consideration that the patient might be at risk for infection with the SARS-CoV-2 virus that causes COVID-19. Institutional protocols and algorithms that pertain to the evaluation of patients at risk for COVID-19 are in a state of rapid change based on information released by regulatory bodies including the CDC and federal and state organizations. These policies and algorithms were followed during the patient's care.

## 2020-09-05 NOTE — Consult Note (Signed)
Cardiology Consultation:   Patient ID: [name] MRN: [id number]; DOB: 02-18-1929  Admit date: 09/04/2020 Date of Consult: 09/05/2020  PCP:  Sunnie Nielsen, DO   [facility] Providers Cardiologist:  Olga Millers, MD        Patient Profile:   Katherine Cervantes is a 87 y.o. female  who is being seen 09/05/2020 for the evaluation of chest pain at the request of Dr. Alesia Banda.  History of Present Illness:   [name] is an 87 year old patient known to our practice with coronary artery disease, permanent atrial fibrillation morbid obesity pacemaker secondary to sinus node dysfunction with recent shingles who came to the emergency department with left hand swelling redness, cellulitis.  She also noted severe chest pain for 2 hours.  Currently she is not having any chest discomfort.  When asked to point where her chest was hurting, she does point mostly in the central region.  She still has what she thinks is residual pain from her shingles.  She states that she thinks her pain is coming from her previous bout of shingles.  No current shortness of breath.  Left hand is slightly improved.  [name], her daughter, help provide history during previous clinical visit with Dr. Chilton Si on 07/07/2020.  She stated that she was having central and right-sided chest pain that have been intermittent since February.  She had central chest shingles that had resolved but pain lingered.  Pain flares up when she is moving getting up and down.  She was able to walk a little bit with the aid of a walker.  Breathing is not as good as she would wish.  Dr. Duke Salvia ordered a stress test for her but she decided to cancel.  Thought was that her pain was likely from shingles.  Past Medical History:  Diagnosis Date   Anticoagulation adequate 05/22/2018   Asthma    Atrial fibrillation (HCC) 09/27/2011   CAD (coronary artery disease) 09/27/2011   Diabetes mellitus without complication (HCC)    Diverticulosis  11/04/2011   Dyslipidemia 09/27/2011   History of vasculitis 09/27/2011   Hypertension 09/27/2011   Incontinence 10/15/2015   Morbid obesity with BMI of 40.0-44.9, adult (HCC) 10/03/2016   Osteoporosis 09/27/2011   Pacemaker    PUD (peptic ulcer disease)    Rheumatoid arthritis(714.0) 09/27/2011   Short-term memory loss 10/03/2016   Sleep apnea 09/27/2011    Past Surgical History:  Procedure Laterality Date   ABDOMINAL HYSTERECTOMY     ANKLE SURGERY     APPENDECTOMY     Carpal tunnel     CHOLECYSTECTOMY     PARTIAL GASTRECTOMY     PPM GENERATOR CHANGEOUT N/A 11/13/2018   Procedure: PPM GENERATOR CHANGEOUT;  Surgeon: Marinus Maw, MD;  Location: [facility];  Service: Cardiovascular;  Laterality: N/A;   REPLACEMENT TOTAL KNEE Bilateral      Home Medications:  Prior to Admission medications   Medication Sig Start Date End Date Taking? Authorizing Provider  fluconazole (DIFLUCAN) 150 MG tablet Take 150 mg by mouth once a week. 03/15/20  Yes [provider]  gabapentin (NEURONTIN) 400 MG capsule Take 400 mg by mouth See admin instructions. 800mg  in the morning 400mg  twice daily   Yes [provider]  acetaminophen (TYLENOL) 325 MG tablet Take 2 tablets (650 mg total) by mouth every 4 (four) hours as needed for headache or mild pain. 05/22/18   Leone Brand, NP  albuterol (VENTOLIN HFA) 108 (90 Base) MCG/ACT  inhaler Inhale 1-2 puffs into the lungs every 4 (four) hours as needed for wheezing or shortness of breath. 02/18/20   Tollie Eth, NP  clotrimazole (LOTRIMIN) 1 % cream Apply topically as needed. Apply topically to groin area, breast folds and inner thigh 2 times a day for yeast 03/21/20 03/21/21  [provider]  furosemide (LASIX) 80 MG tablet Take 1 tablet (80 mg total) by mouth 2 (two) times daily. 02/14/20   Marinus Maw, MD  metoprolol succinate (TOPROL-XL) 50 MG 24 hr tablet Take 1 tablet (50 mg total) by mouth daily. 03/08/20   Sunnie Nielsen, DO   nitroGLYCERIN (NITROSTAT) 0.4 MG SL tablet Place 1 tablet (0.4 mg total) under the tongue every 5 (five) minutes as needed for chest pain (MAX 3 TABLETS). 12/24/17   Jodelle Gross, NP  nystatin (MYCOSTATIN/NYSTOP) powder Apply topically 2 (two) times daily.    [provider]  potassium chloride (KLOR-CON) 10 MEQ tablet Take 10 mEq by mouth daily. Take 1 tablet by mouth in the evening for low iron    [provider]  rivaroxaban (XARELTO) 20 MG TABS tablet Take 1 tablet (20 mg total) by mouth daily. 12/31/19 03/30/20  Sunnie Nielsen, DO  spironolactone (ALDACTONE) 25 MG tablet Take 1 tablet (25 mg total) by mouth daily. 05/23/18   Leone Brand, NP  traMADol (ULTRAM) 50 MG tablet Take 1 tablet (50 mg total) by mouth every 6 (six) hours as needed for severe pain. 03/08/20   Sunnie Nielsen, DO    Inpatient Medications: Scheduled Meds:  furosemide  40 mg Intravenous BID   gabapentin  400 mg Oral TID   insulin aspart  0-9 Units Subcutaneous Q4H   metoprolol succinate  50 mg Oral Daily   rivaroxaban  20 mg Oral Daily   sodium chloride flush  3 mL Intravenous Q12H   spironolactone  25 mg Oral Daily   Continuous Infusions:  sodium chloride     [START ON 09/06/2020] vancomycin     PRN Meds: sodium chloride, acetaminophen **OR** acetaminophen, albuterol, fentaNYL (SUBLIMAZE) injection, sodium chloride flush, traMADol  Allergies:    Allergies  Allergen Reactions   Aspirin Anaphylaxis   Other Anaphylaxis   Penicillins Anaphylaxis    Anaphylaxis   Codeine Other (See Comments)    SOB Asthmatic issues   Levofloxacin In D5w Other (See Comments)    Disoriented, Hallucinations Other reaction(s): Other Disoriented, Hallucinations   Morphine And Related Other (See Comments)    Anaphylaxis   Sulfa Antibiotics Rash   Ether    Prednisone Other (See Comments)    Lethargic, rash   Procaine     Social History:   Social History   Socioeconomic History    Marital status: Widowed    Spouse name: Not on file   Number of children: 7   Years of education: some college   Highest education level: Some college, no degree  Occupational History   Occupation: retired    Comment: social services  Tobacco Use   Smoking status: Former    Types: Cigarettes   Smokeless tobacco: Never   Tobacco comments:    quit 4 months ago  Vaping Use   Vaping Use: Never used  Substance and Sexual Activity   Alcohol use: Yes    Alcohol/week: 0.0 standard drinks    Comment: Occasional   Drug use: No   Sexual activity: Not Currently  Other Topics Concern   Not on file  Social History Narrative  Not on file   Social Determinants of Health   Financial Resource Strain: Low Risk    Difficulty of Paying Living Expenses: Not hard at all  Food Insecurity: No Food Insecurity   Worried About Programme researcher, broadcasting/film/video in the Last Year: Never true   Ran Out of Food in the Last Year: Never true  Transportation Needs: No Transportation Needs   Lack of Transportation (Medical): No   Lack of Transportation (Non-Medical): No  Physical Activity: Insufficiently Active   Days of Exercise per Week: 3 days   Minutes of Exercise per Session: 20 min  Stress: No Stress Concern Present   Feeling of Stress : Not at all  Social Connections: Moderately Isolated   Frequency of Communication with Friends and Family: More than three times a week   Frequency of Social Gatherings with Friends and Family: Once a week   Attends Religious Services: Never   Database administrator or Organizations: Yes   Attends Banker Meetings: Never   Marital Status: Widowed  Catering manager Violence: Not At Risk   Fear of Current or Ex-Partner: No   Emotionally Abused: No   Physically Abused: No   Sexually Abused: No    Family History:    Family History  Problem Relation Age of Onset   Hypertension Mother    Cancer Brother        colon   Dementia Brother    Cancer Daughter         colon,breast     ROS:  Please see the history of present illness.   All other ROS reviewed and negative.     Physical Exam/Data:   Vitals:   09/05/20 0409 09/05/20 0500 09/05/20 0750 09/05/20 1154  BP: (!) 153/73  (!) 150/71 (!) 158/59  Pulse: 60  61 (!) 59  Resp: (!) Temp: 97.7 F (36.5 C)  98 F (36.7 C) 97.7 F (36.5 C)  TempSrc: Oral  Oral Oral  SpO2: 93%  95% 97%  Weight:  95.5 kg    Height:   (1.549 m)      Intake/Output Summary (Last 24 hours) at 09/05/2020 1327 Last data filed at 09/05/2020 0422 Gross per 24 hour  Intake 772.89 ml  Output 400 ml  Net 372.89 ml   Last 3 Weights 09/05/2020 09/04/2020 08/10/2020  Weight (lbs) 210 lb 8.6 oz 210 lb 12.8 oz 213 lb  Weight (kg) 95.5 kg 95.618 kg 96.616 kg     Body mass index is 39.78 kg/m.  General:  Well nourished, well developed, in no acute distress HEENT: normal Lymph: no adenopathy Neck: no JVD Endocrine:  No thryomegaly Vascular: No carotid bruits  Cardiac:  normal S1, S2; RRR; no murmur, pacemaker Lungs:  clear to auscultation bilaterally, no wheezing, rhonchi or rales  Abd: soft, nontender, no hepatomegaly  Ext: Left hand edema noted Musculoskeletal: Left hand swelling Skin: warm and dry  Neuro:  CNs 2-12 intact, no focal abnormalities noted Psych:  Normal affect   EKG:  The EKG was personally reviewed and demonstrates: Ventricular pacing atrial fibrillation Telemetry:  Telemetry was personally reviewed and demonstrates: Ventricular pacing atrial fibrillation  Relevant CV Studies: Echocardiogram 07/28/2020:    1. Possible hypokinesis of the apex (not well visualized; pt declined  definity); overall LV function appears to be perserved.   2. Left ventricular ejection fraction, by estimation, is 55 to 60%. The  left ventricle has normal function. The left ventricle  demonstrates  regional wall motion abnormalities (see scoring diagram/findings for  description). Left ventricular  diastolic  parameters are indeterminate.   3. Right ventricular systolic function is normal. The right ventricular  size is normal.   4. Left atrial size was moderately dilated.   5. Right atrial size was moderately dilated.   6. A small pericardial effusion is present.   7. The mitral valve is normal in structure. Trivial mitral valve  regurgitation. No evidence of mitral stenosis. Moderate mitral annular  calcification.   8. The aortic valve is tricuspid. Aortic valve regurgitation is trivial.  No aortic stenosis is present.   9. The inferior vena cava is normal in size with greater than 50%  respiratory variability, suggesting right atrial pressure of 3 mmHg.   Nuclear stress test was canceled by patient on 07/31/2020.  She did not wish to reorder at that time.  Laboratory Data:  High Sensitivity Troponin:   Recent Labs  Lab 09/04/20 1600 09/04/20 1748 09/05/20 0315 09/05/20 0515  TROPONINIHS 22* 24* 18* 18*     Chemistry Recent Labs  Lab 09/04/20 1600 09/05/20 0315  NA 136 137  K 3.2* 3.0*  CL 95* 98  CO2 31 29  GLUCOSE 127* 122*  BUN 25* 21  CREATININE 0.89 0.78  CALCIUM 8.7* 8.8*  GFRNONAA >60 >60  ANIONGAP 10 10    Recent Labs  Lab 09/04/20 1600 09/05/20 0315  PROT 7.8 7.2  ALBUMIN 3.1* 2.8*  AST 16 15  ALT 10 11  ALKPHOS 47 39  BILITOT 0.9 1.5*   Hematology Recent Labs  Lab 09/04/20 1600 09/05/20 0315  WBC 10.1 8.3  RBC 4.21 4.31  HGB 12.4 12.9  HCT 37.6 38.7  MCV 89.3 89.8  MCH 29.5 29.9  MCHC 33.0 33.3  RDW 15.7* 15.5  PLT 232 230   BNP Recent Labs  Lab 09/04/20 1600  BNP 386.8*    DDimer No results for input(s): DDIMER in the last 168 hours.   Radiology/Studies:  DG Chest 2 View  Result Date: 09/04/2020 CLINICAL DATA:  Chest pain. EXAM: CHEST - 2 VIEW COMPARISON:  May 23, 2020. FINDINGS: Stable cardiomegaly. Left-sided pacemaker is unchanged in position. No pneumothorax or pleural effusion is noted. Lungs are clear. Old  right rib fractures are noted. IMPRESSION: No active cardiopulmonary disease. Aortic Atherosclerosis (ICD10-I70.0). Electronically Signed   By: [name] M.D.   On: 09/04/2020 14:50   US Venous Img Upper Uni Left  Result Date: 09/04/2020 CLINICAL DATA:  87 year old with left hand and arm swelling. EXAM: LEFT UPPER EXTREMITY VENOUS DOPPLER ULTRASOUND TECHNIQUE: Gray-scale sonography with graded compression, as well as color Doppler and duplex ultrasound were performed to evaluate the upper extremity deep venous system from the level of the subclavian vein and including the jugular, axillary, basilic, radial, ulnar and upper cephalic vein. Spectral Doppler was utilized to evaluate flow at rest and with distal augmentation maneuvers. COMPARISON:  None. FINDINGS: Contralateral Subclavian Vein: No evidence of thrombus. Normal color Doppler flow. Internal Jugular Vein: No evidence of thrombus. Normal compressibility, color flow and phasicity. Subclavian Vein: No evidence of thrombus. Normal compressibility, color Doppler flow and phasicity. Axillary Vein: No evidence of thrombus. Normal compressibility, color Doppler flow and augmentation. Cephalic Vein: No evidence of thrombus. Normal compressibility and color Doppler flow. Basilic Vein: No evidence of thrombus. Normal compressibility and color Doppler flow. Brachial Veins: No evidence of thrombus. Normal compressibility and color Doppler flow. Radial Veins: No evidence of thrombus.  Normal compressibility. Ulnar Veins: No evidence of thrombus.  Normal compressibility. Other Findings:  None visualized. IMPRESSION: No evidence of DVT within the left upper extremity. Electronically Signed   By: Richarda Overlie M.D.   On: 09/04/2020 15:04   DG Hand Complete Left  Result Date: 09/04/2020 CLINICAL DATA:  Left hand pain and swelling. EXAM: LEFT HAND - COMPLETE 3+ VIEW COMPARISON:  May 09, 2009. FINDINGS: No acute fracture or dislocation is noted. Resorption of the  proximal portion of the scaphoid bone is noted most consistent with avascular necrosis from old injury. Mild degenerative changes seen involving the first carpometacarpal joint. Soft tissue swelling is seen involving the second and third fingers. Moderate degenerative changes seen involving the third metacarpophalangeal joint. IMPRESSION: Degenerative joint disease is seen involving the first carpometacarpal joint as well as the third metacarpophalangeal joint. Soft tissue swelling is seen involving the second and third fingers. 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 No acute fracture or dislocation is noted. Electronically Signed   By: [name] M.D.   On: 09/04/2020 14:54     Assessment and Plan:   87 year old with chronic diastolic heart failure permanent pacemaker, permanent atrial fibrillation with diabetes hypertension venous stasis chronic leg edema here with chest pain.  Chest pain - Quite atypical.  Chest pain is secondary to shingles.  Troponin unremarkable, flat at approximately 18, not ACS.  Plan as outpatient was to obtain stress test previously however she did cancel it in June.  I do not feel like we need to perform any testing in the hospital setting.  As an outpatient, if she desires or if Dr. Duke Salvia desires to reorder stress test, this can be done.  Cellulitis of hand - Antibiotics per primary team.  Sleep apnea - Noncompliant with CPAP  Hyperlipidemia - Does not wish to take statin  Coronary artery disease - Has an aspirin allergy.  Continuing beta-blocker.  Chronic diastolic heart failure - Currently euvolemic.  No shortness of breath sitting in chair.  Continue with current home regimen.  We will sign off.  Please let us know if we can be of further assistance.  For questions or updates, please contact [facility] Please consult [email or web address] for contact info under    Signed, Donato Schultz, MD  09/05/2020 1:27 PM

## 2020-09-05 NOTE — Progress Notes (Signed)
   Follow Up Note   Pt admitted earlier this morning.  Seen after arrived to floor.   Briefly, patient with CAD, permanent A. fib, pacemaker secondary to sinus node dysfunction, presented to the ED with left hand cellulitis and chest pain.  Patient started on antibiotics, with some noted mild improvement.  Cardiology consulted for chest pain, no further recommendations, likely due to recent shingles outbreak.  Troponin unremarkable, flat, EKG with no acute ST changes.   Today, patient denies any new complaints.  Appears to be irritated when questions asked.  Denies any further chest pain, shortness of breath, abdominal pain, nausea/vomiting, fever/chills.  Exam: General: NAD  Cardiovascular: S1, S2 present Respiratory: Diminished breath sounds bilaterally Abdomen: Soft, nontender, nondistended, bowel sounds present Musculoskeletal: No bilateral pedal edema noted Skin: LUE swelling with erythema, tenderness slightly warm Psychiatry: Normal mood   Present on Admission:  Chest pain  Venous insufficiency of both lower extremities  Sleep apnea  Permanent atrial fibrillation (HCC)  Pacemaker  Hyperlipidemia  Essential hypertension  CAD (coronary artery disease)  Chronic diastolic CHF (congestive heart failure) (HCC)  Cellulitis of hand  Cellulitis   Left upper extremity cellulitis Currently afebrile, with no leukocytosis Venous ultrasound showed no evidence of DVT X-ray showed soft tissue swelling Was initially started on clindamycin, admitting physician switched to vancomycin Continue vancomycin for now, if no significant improvement may switch back to clindamycin or oritavancin (patient has anaphylaxis to penicillin) Monitor closely

## 2020-09-05 NOTE — Evaluation (Signed)
Occupational Therapy Evaluation Patient Details Name: Katherine Cervantes MRN: [id number] DOB: [birth year] Today's Date: 09/05/2020    History of Present Illness Pt is an 87 y.o. female admitted 09/04/20 with severe chest pain and L hand swelling, pain, redness. Workup for L hand cellulitis; cardiology consult for recurrent chest pain. PMH includes recent shingles (R shoulder), CHF, afib, pacemaker, HTN, DM2, obesity, chronic LE edema, asthma, anxiety, depression.   Clinical Impression   PTA, pt from [facility]. Pt typically ambulatory with RW in facility and receives assist for ADLs. Pt presents now with decreased use of L dominant UE due to swelling/pain. Pt requires consistent cues for sequencing and attention to tasks. Pt overall Max A for bed mobility, Max A for sit to stand and Min A for taking steps to recliner using RW (able to lightly hold to DME with L hand). Pt requires Mod A for UB ADLs and Total A for LB ADLs due to deficits noted below. Recommend DC back to SNF with therapy follow-up.     Follow Up Recommendations  SNF;Supervision/Assistance - 24 hour (return to SNF)    Equipment Recommendations  None recommended by OT    Recommendations for Other Services       Precautions / Restrictions Precautions Precautions: Fall Restrictions Weight Bearing Restrictions: No      Mobility Bed Mobility Overal bed mobility: Needs Assistance Bed Mobility: Supine to Sit     Supine to sit: Max assist;HOB elevated     General bed mobility comments: Heavy assist to lift trunk, able to assist in advancing LEs but requires frequent cues for sequencing    Transfers Overall transfer level: Needs assistance Equipment used: Rolling walker (2 wheeled) Transfers: Sit to/from [employer] Sit to Stand: Max assist Stand pivot transfers: Min assist       General transfer comment: Max A for sit to stand with RW, increased time to achieve posture. MIn A with light assist for  balance/advancing RW to chair. Slow pacing, does get agitated when cueing for sequencing    Balance Overall balance assessment: Needs assistance Sitting-balance support: No upper extremity supported;Feet supported Sitting balance-Leahy Scale: Fair     Standing balance support: Bilateral upper extremity supported;During functional activity Standing balance-Leahy Scale: Poor Standing balance comment: reliant on UE support + external support                           ADL either performed or assessed with clinical judgement   ADL Overall ADL's : Needs assistance/impaired Eating/Feeding: Set up;Sitting Eating/Feeding Details (indicate cue type and reason): setup to open packing, prepare coffee. able to drink from cups with R hand Grooming: Minimal assistance;Sitting   Upper Body Bathing: Moderate assistance;Sitting   Lower Body Bathing: Total assistance;Sit to/from stand   Upper Body Dressing : Minimal assistance;Sitting   Lower Body Dressing: Total assistance;Sit to/from stand   Toilet Transfer: Moderate assistance;Stand-pivot;RW Toilet Transfer Details (indicate cue type and reason): simulated to chair Toileting- Clothing Manipulation and Hygiene: Total assistance;Sit to/from stand         General ADL Comments: Limited by decreased ability to use dominant L UE due to swelling, pain. Cognition hinders problem solving, sequencing     Vision Patient Visual Report: No change from baseline Vision Assessment?: No apparent visual deficits     Perception     Praxis      Pertinent Vitals/Pain Pain Assessment: Faces Faces Pain Scale: Hurts little more Pain Location: L  UE with movement Pain Descriptors / Indicators: Grimacing;Guarding;Sore Pain Intervention(s): Monitored during session;Limited activity within patient's tolerance     Hand Dominance Left   Extremity/Trunk Assessment Upper Extremity Assessment Upper Extremity Assessment: LUE deficits/detail LUE  Deficits / Details: edematous, able to wiggle fingers, cannot form a fist. wrist about 5* ROM lacking, elbow WFL LUE: Unable to fully assess due to pain LUE Sensation: WNL LUE Coordination: decreased fine motor   Lower Extremity Assessment Lower Extremity Assessment: Defer to PT evaluation   Cervical / Trunk Assessment Cervical / Trunk Assessment: Kyphotic   Communication Communication Communication: No difficulties   Cognition Arousal/Alertness: Awake/alert Behavior During Therapy: WFL for tasks assessed/performed;Agitated Overall Cognitive Status: History of cognitive impairments - at baseline Area of Impairment: Orientation;Attention;Memory;Following commands;Safety/judgement;Awareness;Problem solving                 Orientation Level: Disoriented to;Place;Time;Situation Current Attention Level: Sustained Memory: Decreased short-term memory Following Commands: Follows one step commands with increased time;Follows one step commands inconsistently Safety/Judgement: Decreased awareness of deficits;Decreased awareness of safety Awareness: Intellectual Problem Solving: Slow processing;Requires verbal cues;Difficulty sequencing;Requires tactile cues General Comments: Able to state July 2022, believed she was in [place] and unsure of why she was hospitalized. Able to follow directions >80% of the time. Does become agitated with cues for sequencing due to slow processing. unable to recall PLOF   General Comments  Will need more pillows to effectively elevate L UE to decrease swelling    Exercises     Shoulder Instructions      Home Living Family/patient expects to be discharged to:: Skilled nursing facility                                 Additional Comments: From [facility] SNF      Prior Functioning/Environment Level of Independence: Needs assistance  Gait / Transfers Assistance Needed: use of RW for mobility. Per daughter, when L UE cellulitis occurs,  difficult to [employer] ADL's / Homemaking Assistance Needed: assist with ADLs at SNF. WIth cellulitis of L UE, difficult to assist with ADLs            OT Problem List: Decreased strength;Decreased range of motion;Decreased activity tolerance;Impaired balance (sitting and/or standing);Decreased safety awareness;Decreased cognition;Pain;Impaired UE functional use      OT Treatment/Interventions: Self-care/ADL training;Therapeutic exercise;DME and/or AE instruction;Patient/family education;Balance training    OT Goals(Current goals can be found in the care plan section) Acute Rehab OT Goals Patient Stated Goal: for L arm to feel better OT Goal Formulation: With patient Time For Goal Achievement: 09/19/20 Potential to Achieve Goals: Fair ADL Goals Pt Will Perform Grooming: with set-up;sitting Pt Will Transfer to Toilet: with min assist;ambulating Pt/caregiver will Perform Home Exercise Program: Increased ROM;Increased strength;Left upper extremity;With Supervision;With written HEP provided  OT Frequency: Min 2X/week   Barriers to D/C:            Co-evaluation              AM-PAC OT "6 Clicks" Daily Activity     Outcome Measure Help from another person eating meals?: A Little Help from another person taking care of personal grooming?: A Little Help from another person toileting, which includes using toliet, bedpan, or urinal?: Total Help from another person bathing (including washing, rinsing, drying)?: A Lot Help from another person to put on and taking off regular upper body clothing?: A Little Help from another person to put on and  taking off regular lower body clothing?: Total 6 Click Score: 13   End of Session Equipment Utilized During Treatment: Gait belt;Rolling walker Nurse Communication: Mobility status  Activity Tolerance: Patient tolerated treatment well Patient left: in chair;with call bell/phone within reach;with chair alarm set  OT Visit Diagnosis:  Unsteadiness on feet (R26.81);Other abnormalities of gait and mobility (R26.89);Muscle weakness (generalized) (M62.81);Pain Pain - Right/Left: Left Pain - part of body: Hand;Arm                Time: 5462-7035 OT Time Calculation (min): 31 min Charges:  OT General Charges $OT Visit: 1 Visit OT Evaluation $OT Eval Moderate Complexity: 1 Mod OT Treatments $Self Care/Home Management : 8-22 mins  Bradd Canary, OTR/L Acute Rehab Services Office: [phone]   Lorre Munroe 09/05/2020, 8:27 AM

## 2020-09-05 NOTE — Progress Notes (Signed)
Pharmacy Antibiotic Note  Katherine Cervantes is a 87 y.o. female admitted on 09/04/2020 with hand cellulitis.  Pharmacy has been consulted for vancomycin dosing.  Plan: Vancomycin 1250mg  x1 then 750mg  IV Q24H. Goal AUC 400-550.  Expected AUC 520.  SCr 0.89.   Height: 5' (152.4 cm) Weight: 95.6 kg (210 lb 12.8 oz) IBW/kg (Calculated) : 45.5  Temp (24hrs), Avg:97.8 F (36.6 C), Min:97.7 F (36.5 C), Max:98.1 F (36.7 C)  Recent Labs  Lab 09/04/20 1600  WBC 10.1  CREATININE 0.89    Estimated Creatinine Clearance: 44.3 mL/min (by C-G formula based on SCr of 0.89 mg/dL).    Allergies  Allergen Reactions   Aspirin Anaphylaxis   Other Anaphylaxis   Penicillins Anaphylaxis    Anaphylaxis   Codeine Other (See Comments)    SOB Asthmatic issues   Levofloxacin In D5w Other (See Comments)    Disoriented, Hallucinations Other reaction(s): Other Disoriented, Hallucinations   Morphine And Related Other (See Comments)    Anaphylaxis   Sulfa Antibiotics Rash   Ether    Prednisone Other (See Comments)    Lethargic, rash   Procaine      Thank you for allowing pharmacy to be a part of this patient's care.  , PharmD, BCPS  09/05/2020 1:13 AM

## 2020-09-05 NOTE — Evaluation (Signed)
Physical Therapy Evaluation Patient Details Name: Katherine Cervantes MRN: [id number] DOB: Jul 12, 1929 Today's Date: 09/05/2020   History of Present Illness  Pt is an 87 y.o. female admitted from [facility] on 09/04/20 with severe chest pain and L hand swelling, pain, redness. Workup for L hand cellulitis. Per cardiology, suspect recurrent chest pain related to shingles. PMH includes recent shingles (R shoulder/chest), CHF, afib, pacemaker, HTN, DM2, obesity, chronic LE edema, asthma, anxiety, depression.   Clinical Impression  Pt presents with an overall decrease in functional mobility secondary to above. Pt poor historian, A&O to self only; per chart, pt from [facility], can ambulate short distance with RW, assist from staff for ADLs. Today, pt required maxA to stand with RW, significant difficulty with standing and attempts to walk; will require +2 assist for activity progression. Pt would benefit from return to SNF with post-acute therapy services to maximize functional mobility and decrease caregiver burden. Will follow acutely to address established goals.   DOE 3-4/4 with minimal activity, SpO2 96% on RA    Follow Up Recommendations SNF;Supervision/Assistance - 24 hour    Equipment Recommendations  Wheelchair (bariatric);Wheelchair cushion;Hospital bed;Mechanical lift   Recommendations for Other Services       Precautions / Restrictions Precautions Precautions: Fall;Other (comment) Precaution Comments: Bladder incontinence; orthopnea Restrictions Weight Bearing Restrictions: No      Mobility  Bed Mobility Overal bed mobility: Needs Assistance Bed Mobility: Sit to Supine       Sit to supine: Max assist   General bed mobility comments: MaxA for BLE management and trunk assist with return to supine; mod-maxA for repositioning hips/shoulders once supine; pt unable to tolerate laying flat, able to minimally assist scooting up when cued to use BLEs    Transfers Overall  transfer level: Needs assistance Equipment used: Rolling walker (2 wheeled) Transfers: Sit to/from Stand Sit to Stand: Max assist Stand pivot transfers: Mod assist;Max assist       General transfer comment: pt with difficulty standing from recliner to RW since unable to assist with LUE, RUE HHA to pull into standing but pt repeatedly letting go in attempts to grab RW; RW removed and pt able to stand on 4th trial with RUE support and maxA for trunk elevation; RW then placed in front and pt able to grab with both hands; minimal pivotal steps with RW and modA, ultimately requiring maxA for safe eccentric control to sit EOB  Ambulation/Gait             General Gait Details: pt with difficulty shifting weight in order to take complete steps, despite RW and mod-maxA; will require +2 assist for safety with this  Stairs            Wheelchair Mobility    Modified Rankin (Stroke Patients Only)       Balance Overall balance assessment: Needs assistance Sitting-balance support: No upper extremity supported;Feet supported;Single extremity supported Sitting balance-Leahy Scale: Poor Sitting balance - Comments: Pt unable to maintain static sitting at edge of recliner or EOB without UE support and/or external assist   Standing balance support: Bilateral upper extremity supported;During functional activity Standing balance-Leahy Scale: Poor Standing balance comment: Reliant on UE support and external assist                             Pertinent Vitals/Pain Pain Assessment: Faces Faces Pain Scale: Hurts little more Pain Location: LUE with movement Pain Descriptors / Indicators: Grimacing;Guarding;Sore;Moaning  Pain Intervention(s): Monitored during session;Limited activity within patient's tolerance    Home Living Family/patient expects to be discharged to:: Assisted living                 Additional Comments: From [facility]/SNF    Prior Function Level of  Independence: Needs assistance   Gait / Transfers Assistance Needed: Use of RW for short ambulation distance; daughter reports difficulty maneuvering walker when LUE cellulitis occurs  ADL's / Homemaking Assistance Needed: Requires assist from staff for ADLs        Hand Dominance   Dominant Hand: Left    Extremity/Trunk Assessment   Upper Extremity Assessment Upper Extremity Assessment: Generalized weakness;LUE deficits/detail LUE Deficits / Details: edematous, able to wiggle fingers, cannot form a fist. wrist about 5* ROM lacking; endorses pain with L shoulder/elbow AROM LUE Coordination: decreased fine motor;decreased gross motor    Lower Extremity Assessment Lower Extremity Assessment: Generalized weakness    Cervical / Trunk Assessment Cervical / Trunk Assessment: Kyphotic  Communication   Communication: No difficulties  Cognition Arousal/Alertness: Awake/alert Behavior During Therapy: WFL for tasks assessed/performed Overall Cognitive Status: History of cognitive impairments - at baseline Area of Impairment: Orientation;Attention;Memory;Following commands;Safety/judgement;Awareness;Problem solving                 Orientation Level: Disoriented to;Place;Time;Situation Current Attention Level: Sustained Memory: Decreased short-term memory Following Commands: Follows one step commands with increased time;Follows one step commands inconsistently Safety/Judgement: Decreased awareness of deficits;Decreased awareness of safety Awareness: Intellectual Problem Solving: Slow processing;Requires verbal cues;Difficulty sequencing;Requires tactile cues General Comments: Reports it's 1992, "month 2", unable to state location despite multiple cues; when asked where [place] was [place], [place] advised me to come here... you're the only trustworthy one I've met so far...". Pt pleasant and agreeable throughout session;  following >75% of simple commands      General Comments General comments (skin integrity, edema, etc.): DOE 3-4/4 with activity; SpO2 96% on RA    Exercises     Assessment/Plan    PT Assessment Patient needs continued PT services  PT Problem List Decreased strength;Decreased range of motion;Decreased activity tolerance;Decreased balance;Decreased mobility;Decreased cognition;Decreased knowledge of use of DME;Decreased safety awareness;Cardiopulmonary status limiting activity;Obesity;Pain       PT Treatment Interventions DME instruction;Gait training;Functional mobility training;Therapeutic activities;Therapeutic exercise;Balance training;Cognitive remediation;Patient/family education;Wheelchair mobility training    PT Goals (Current goals can be found in the Care Plan section)  Acute Rehab PT Goals Patient Stated Goal: None stated PT Goal Formulation: With patient Time For Goal Achievement: 09/19/20 Potential to Achieve Goals: Fair    Frequency Min 2X/week   Barriers to discharge        Co-evaluation               AM-PAC PT "6 Clicks" Mobility  Outcome Measure Help needed turning from your back to your side while in a flat bed without using bedrails?: A Lot Help needed moving from lying on your back to sitting on the side of a flat bed without using bedrails?: A Lot Help needed moving to and from a bed to a chair (including a wheelchair)?: A Lot Help needed standing up from a chair using your arms (e.g., wheelchair or bedside chair)?: A Lot Help needed to walk in hospital room?: Total Help needed climbing 3-5 steps with a railing? : Total 6 Click Score: 10    End of Session Equipment Utilized During Treatment: Gait belt Activity  Tolerance: Patient tolerated treatment well;Patient limited by fatigue Patient left: in bed;with call bell/phone within reach;with bed alarm set Nurse Communication: Mobility status PT Visit Diagnosis: Other abnormalities of gait and  mobility (R26.89);Muscle weakness (generalized) (M62.81);Pain Pain - Right/Left: Left Pain - part of body: Arm;Hand    Time: 2229-7989 PT Time Calculation (min) (ACUTE ONLY): 24 min   Charges:   PT Evaluation $PT Eval Moderate Complexity: 1 Mod PT Treatments $Therapeutic Activity: 8-22 mins      Mabeline Caras, PT, DPT Acute Rehabilitation Services  Pager [phone] Office [place] 09/05/2020, 3:32 PM

## 2020-09-06 DIAGNOSIS — M79642 Pain in left hand: Secondary | ICD-10-CM | POA: Diagnosis not present

## 2020-09-06 LAB — GLUCOSE, CAPILLARY
Glucose-Capillary: 103 mg/dL — ABNORMAL HIGH (ref 70–99)
Glucose-Capillary: 114 mg/dL — ABNORMAL HIGH (ref 70–99)
Glucose-Capillary: 120 mg/dL — ABNORMAL HIGH (ref 70–99)
Glucose-Capillary: 152 mg/dL — ABNORMAL HIGH (ref 70–99)
Glucose-Capillary: 81 mg/dL (ref 70–99)
Glucose-Capillary: 94 mg/dL (ref 70–99)

## 2020-09-06 LAB — CBC WITH DIFFERENTIAL/PLATELET
Abs Immature Granulocytes: 0.04 10*3/uL (ref 0.00–0.07)
Basophils Absolute: 0 10*3/uL (ref 0.0–0.1)
Basophils Relative: 0 %
Eosinophils Absolute: 0.2 10*3/uL (ref 0.0–0.5)
Eosinophils Relative: 2 %
HCT: 38.6 % (ref 36.0–46.0)
Hemoglobin: 12.7 g/dL (ref 12.0–15.0)
Immature Granulocytes: 1 %
Lymphocytes Relative: 22 %
Lymphs Abs: 1.5 10*3/uL (ref 0.7–4.0)
MCH: 29.7 pg (ref 26.0–34.0)
MCHC: 32.9 g/dL (ref 30.0–36.0)
MCV: 90.4 fL (ref 80.0–100.0)
Monocytes Absolute: 0.8 10*3/uL (ref 0.1–1.0)
Monocytes Relative: 12 %
Neutro Abs: 4.2 10*3/uL (ref 1.7–7.7)
Neutrophils Relative %: 63 %
Platelets: 245 10*3/uL (ref 150–400)
RBC: 4.27 MIL/uL (ref 3.87–5.11)
RDW: 15.6 % — ABNORMAL HIGH (ref 11.5–15.5)
WBC: 6.8 10*3/uL (ref 4.0–10.5)
nRBC: 0 % (ref 0.0–0.2)

## 2020-09-06 LAB — HEMOGLOBIN A1C
Hgb A1c MFr Bld: 6.5 % — ABNORMAL HIGH (ref 4.8–5.6)
Mean Plasma Glucose: 140 mg/dL

## 2020-09-06 LAB — BASIC METABOLIC PANEL
Anion gap: 7 (ref 5–15)
BUN: 22 mg/dL (ref 8–23)
CO2: 32 mmol/L (ref 22–32)
Calcium: 9 mg/dL (ref 8.9–10.3)
Chloride: 99 mmol/L (ref 98–111)
Creatinine, Ser: 0.91 mg/dL (ref 0.44–1.00)
GFR, Estimated: >60 mL/min (ref >60)
Glucose, Bld: 101 mg/dL — ABNORMAL HIGH (ref 70–99)
Potassium: 3.7 mmol/L (ref 3.5–5.1)
Sodium: 138 mmol/L (ref 135–145)

## 2020-09-06 MED ORDER — NYSTATIN 100000 UNIT/GM EX POWD
Freq: Three times a day (TID) | CUTANEOUS | Status: DC
  Filled 2020-09-06: qty 15

## 2020-09-06 MED ORDER — FUROSEMIDE 80 MG PO TABS
80.0000 mg | ORAL_TABLET | Freq: Two times a day (BID) | ORAL | Status: DC
  Administered 2020-09-06 – 2020-09-09 (×8): 80 mg via ORAL
  Filled 2020-09-06 (×8): qty 1

## 2020-09-06 NOTE — Progress Notes (Addendum)
PROGRESS NOTE    Katherine Cervantes  VFI:[id number] DOB: 1929-04-01 DOA: 09/04/2020 PCP: Sunnie Nielsen, DO   Brief Narrative: 87 year old with past medical history significant for CAD, permanent A. fib, pacemaker secondary to sinus node dysfunction, presented to the ED with left hand cellulitis and chest pain. Neurology consulted for chest pain, no further recommendation, chest pain likely due to recent shingles outbreak.  Troponin unremarkable.    Assessment & Plan:   Active Problems:   Essential hypertension   CAD (coronary artery disease)   Permanent atrial fibrillation (HCC)   Sleep apnea   Pacemaker   Type 2 diabetes mellitus (HCC)   Hyperlipidemia   Morbid obesity with BMI of 40.0-44.9, adult (HCC)   Chronic anticoagulation   Venous insufficiency of both lower extremities   Chest pain   Chronic diastolic CHF (congestive heart failure) (HCC)   Cellulitis of hand   Cellulitis  1-Left upper extremity cellulitis: Doppler negative for DVT X-ray show soft tissue swelling Plan to continue with vancomycin Redness and Edema has significantly improved Will plan to treat for 7 days. Will plan to discharge on clindamycin.   2-Chest pain: Atypical: Cardiology consulted no further cardiac work-up planned at 3-Sleep apnea: Patient not on CPAP Permanent A. fib: Continue with Xarelto and Toprol Status post pacemaker for sick sinus syndrome Hypertension: CAD: Continue with beta-blocker Chronic diastolic heart failure: Incision from IV Lasix to oral Lasix today Continue with the spironolactone DM ; A1 6.5.  Continue with Diet out patient.   Estimated body mass index is 40.03 kg/m as calculated from the following:   Height as of this encounter: 5\' 1"  (1.549 m).   Weight as of this encounter: 96.1 kg.   DVT prophylaxis: Xarelto Code Status: DNR Family Communication: Daughter over phone.  Disposition Plan:  Status is: Inpatient  Remains inpatient appropriate  because:Unsafe d/c plan  Dispo: The patient is from:  Assisting living facility              Anticipated d/c is to: SNF              Patient currently is medically stable to d/c.   Difficult to place patient No        Consultants:  Cardiology   Procedures:  None  Antimicrobials:  Vancomycin   Subjective: See report for left hand redness and swelling has significantly improved  Objective: Vitals:   09/05/20 1154 09/05/20 1649 09/05/20 2012 09/06/20 0425  BP: (!) 158/59 (!) 151/49 140/90 134/68  Pulse: (!) 59 60 61 60  Resp: 19 19 17 19   Temp: 97.7 F (36.5 C) 98.5 F (36.9 C) 98.4 F (36.9 C) 97.6 F (36.4 C)  TempSrc: Oral Oral Oral Oral  SpO2: 97% 98% 98% 93%  Weight:    96.1 kg  Height:        Intake/Output Summary (Last 24 hours) at 09/06/2020 0732 Last data filed at 09/05/2020 2300 Gross per 24 hour  Intake 474 ml  Output 950 ml  Net -476 ml   Filed Weights   09/04/20 1238 09/05/20 0500 09/06/20 0425  Weight: 95.6 kg 95.5 kg 96.1 kg    Examination:  General exam: Appears calm and comfortable  Respiratory system: Clear to auscultation Cardiovascular system: S1 & S2 heard, RRR.  Gastrointestinal system: Abdomen is nondistended, soft and nontender. No organomegaly or masses felt. Normal bowel sounds heard. Central nervous system: Alert and oriented. Extremities: Symmetric 5 x 5 power. Skin: Right hand with less redness and swelling  Data Reviewed: I have personally reviewed following labs and imaging studies  CBC: Recent Labs  Lab 09/04/20 1600 09/05/20 0315 09/06/20 0218  WBC 10.1 8.3 6.8  NEUTROABS 7.3 6.0 4.2  HGB 12.4 12.9 12.7  HCT 37.6 38.7 38.6  MCV 89.3 89.8 90.4  PLT 232 230 245   Basic Metabolic Panel: Recent Labs  Lab 09/04/20 1600 09/05/20 0315 09/06/20 0218  NA 136 137 138  K 3.2* 3.0* 3.7  CL 95* 98 99  CO2 31 29 32  GLUCOSE 127* 122* 101*  BUN 25* 21 22  CREATININE 0.89 0.78 0.91  CALCIUM 8.7* 8.8* 9.0  MG  --   1.9  --   PHOS  --  2.9  --    GFR: Estimated Creatinine Clearance: 44.4 mL/min (by C-G formula based on SCr of 0.91 mg/dL). Liver Function Tests: Recent Labs  Lab 09/04/20 1600 09/05/20 0315  AST 16 15  ALT 10 11  ALKPHOS 47 39  BILITOT 0.9 1.5*  PROT 7.8 7.2  ALBUMIN 3.1* 2.8*   Recent Labs  Lab 09/04/20 1600  LIPASE 31   No results for input(s): AMMONIA in the last 168 hours. Coagulation Profile: No results for input(s): INR, PROTIME in the last 168 hours. Cardiac Enzymes: No results for input(s): CKTOTAL, CKMB, CKMBINDEX, TROPONINI in the last 168 hours. BNP (last 3 results) No results for input(s): PROBNP in the last 8760 hours. HbA1C: Recent Labs    09/05/20 0315  HGBA1C 6.5*   CBG: Recent Labs  Lab 09/05/20 1155 09/05/20 1651 09/05/20 2011 09/06/20 0022 09/06/20 0427  GLUCAP 132* 135* 135* 94 103*   Lipid Profile: No results for input(s): CHOL, HDL, LDLCALC, TRIG, CHOLHDL, LDLDIRECT in the last 72 hours. Thyroid Function Tests: Recent Labs    09/05/20 0315  TSH 3.189   Anemia Panel: No results for input(s): VITAMINB12, FOLATE, FERRITIN, TIBC, IRON, RETICCTPCT in the last 72 hours. Sepsis Labs: No results for input(s): PROCALCITON, LATICACIDVEN in the last 168 hours.  Recent Results (from the past 240 hour(s))  Resp Panel by RT-PCR (Flu A&B, Covid) Nasopharyngeal Swab     Status: None   Collection Time: 09/04/20  3:19 PM   Specimen: Nasopharyngeal Swab; Nasopharyngeal(NP) swabs in vial transport medium  Result Value Ref Range Status   SARS Coronavirus 2 by RT PCR NEGATIVE NEGATIVE Final    Comment: (NOTE) SARS-CoV-2 target nucleic acids are NOT DETECTED.  The SARS-CoV-2 RNA is generally detectable in upper respiratory specimens during the acute phase of infection. The lowest concentration of SARS-CoV-2 viral copies this assay can detect is 138 copies/mL. A negative result does not preclude SARS-Cov-2 infection and should not be used as the  sole basis for treatment or other patient management decisions. A negative result may occur with  improper specimen collection/handling, submission of specimen other than nasopharyngeal swab, presence of viral mutation(s) within the areas targeted by this assay, and inadequate number of viral copies(<138 copies/mL). A negative result must be combined with clinical observations, patient history, and epidemiological information. The expected result is Negative.  Fact Sheet for Patients:  BloggerCourse.com  Fact Sheet for Healthcare Providers:  SeriousBroker.it  This test is no t yet approved or cleared by the [place] FDA and  has been authorized for detection and/or diagnosis of SARS-CoV-2 by FDA under an Emergency Use Authorization (EUA). This EUA will remain  in effect (meaning this test can be used) for the duration of the COVID-19 declaration under Section 564(b)(1) of the Act,  21 U.S.C.section 360bbb-3(b)(1), unless the authorization is terminated  or revoked sooner.       Influenza A by PCR NEGATIVE NEGATIVE Final   Influenza B by PCR NEGATIVE NEGATIVE Final    Comment: (NOTE) The Xpert Xpress SARS-CoV-2/FLU/RSV plus assay is intended as an aid in the diagnosis of influenza from Nasopharyngeal swab specimens and should not be used as a sole basis for treatment. Nasal washings and aspirates are unacceptable for Xpert Xpress SARS-CoV-2/FLU/RSV testing.  Fact Sheet for Patients: BloggerCourse.com  Fact Sheet for Healthcare Providers: SeriousBroker.it  This test is not yet approved or cleared by the [place] FDA and has been authorized for detection and/or diagnosis of SARS-CoV-2 by FDA under an Emergency Use Authorization (EUA). This EUA will remain in effect (meaning this test can be used) for the duration of the COVID-19 declaration under Section 564(b)(1) of the  Act, 21 U.S.C. section 360bbb-3(b)(1), unless the authorization is terminated or revoked.  Performed at [facility], [address] [place], [place] [address]   MRSA Next Gen by PCR, Nasal     Status: None   Collection Time: 09/05/20  4:35 AM   Specimen: Nasal Mucosa; Nasal Swab  Result Value Ref Range Status   MRSA by PCR Next Gen NOT DETECTED NOT DETECTED Final    Comment: (NOTE) The GeneXpert MRSA Assay (FDA approved for NASAL specimens only), is one component of a comprehensive MRSA colonization surveillance program. It is not intended to diagnose MRSA infection nor to guide or monitor treatment for MRSA infections. Test performance is not FDA approved in patients less than 2 years old. Performed at [facility], [address], [place], [place] [address]          Radiology Studies: DG Chest 2 View  Result Date: 09/04/2020 CLINICAL DATA:  Chest pain. EXAM: CHEST - 2 VIEW COMPARISON:  May 23, 2020. FINDINGS: Stable cardiomegaly. Left-sided pacemaker is unchanged in position. No pneumothorax or pleural effusion is noted. Lungs are clear. Old right rib fractures are noted. IMPRESSION: No active cardiopulmonary disease. Aortic Atherosclerosis (ICD10-I70.0). Electronically Signed   By: [name] M.D.   On: 09/04/2020 14:50   US Venous Img Upper Uni Left  Result Date: 09/04/2020 CLINICAL DATA:  87 year old with left hand and arm swelling. EXAM: LEFT UPPER EXTREMITY VENOUS DOPPLER ULTRASOUND TECHNIQUE: Gray-scale sonography with graded compression, as well as color Doppler and duplex ultrasound were performed to evaluate the upper extremity deep venous system from the level of the subclavian vein and including the jugular, axillary, basilic, radial, ulnar and upper cephalic vein. Spectral Doppler was utilized to evaluate flow at rest and with distal augmentation maneuvers. COMPARISON:  None. FINDINGS: Contralateral Subclavian Vein: No evidence of thrombus.  Normal color Doppler flow. Internal Jugular Vein: No evidence of thrombus. Normal compressibility, color flow and phasicity. Subclavian Vein: No evidence of thrombus. Normal compressibility, color Doppler flow and phasicity. Axillary Vein: No evidence of thrombus. Normal compressibility, color Doppler flow and augmentation. Cephalic Vein: No evidence of thrombus. Normal compressibility and color Doppler flow. Basilic Vein: No evidence of thrombus. Normal compressibility and color Doppler flow. Brachial Veins: No evidence of thrombus. Normal compressibility and color Doppler flow. Radial Veins: No evidence of thrombus.  Normal compressibility. Ulnar Veins: No evidence of thrombus.  Normal compressibility. Other Findings:  None visualized. IMPRESSION: No evidence of DVT within the left upper extremity. Electronically Signed   By: Richarda Overlie M.D.   On: 09/04/2020 15:04  DG Hand Complete Left  Result Date: 09/04/2020 CLINICAL DATA:  Left hand pain and swelling. EXAM: LEFT HAND - COMPLETE 3+ VIEW COMPARISON:  May 09, 2009. FINDINGS: No acute fracture or dislocation is noted. Resorption of the proximal portion of the scaphoid bone is noted most consistent with avascular necrosis from old injury. Mild degenerative changes seen involving the first carpometacarpal joint. Soft tissue swelling is seen involving the second and third fingers. Moderate degenerative changes seen involving the third metacarpophalangeal joint. IMPRESSION: Degenerative joint disease is seen involving the first carpometacarpal joint as well as the third metacarpophalangeal joint. Soft tissue swelling is seen involving the second and third fingers. 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 No acute fracture or dislocation is noted. Electronically Signed   By: [name] M.D.   On: 09/04/2020 14:54        Scheduled Meds:  furosemide  40 mg Intravenous BID   gabapentin  400 mg Oral TID   insulin aspart  0-9  Units Subcutaneous Q4H   metoprolol succinate  50 mg Oral Daily   rivaroxaban  20 mg Oral Daily   sodium chloride flush  3 mL Intravenous Q12H   spironolactone  25 mg Oral Daily   Continuous Infusions:  sodium chloride     vancomycin 750 mg (09/06/20 0243)     LOS: 1 day    Time spent: 35 minutes    Taheerah Guldin A Teigan Sahli, MD [facility]   If 7PM-7AM, please contact night-coverage [email or web address]  09/06/2020, 7:32 AM

## 2020-09-06 NOTE — TOC Initial Note (Signed)
Transition of Care [facility]) - Initial/Assessment Note    Patient Details  Name: Katherine Cervantes MRN: [id number] Date of Birth: August 02, 1929  [facility] CM/SW Contact:    Lynett Grimes Phone Number: 09/06/2020, 5:11 PM  Clinical Narrative:                 CSW received SNF consult. CSW spoke with pt daughter. CSW introduced self and explained role at the hospital. Pt reports that PTA the facility where pt resides is [facility]. PT reports pt needs mod-max assist. PT reports that pt will need a bariatric equipment such as bariatric wheelchair/cushion and a bariatric bed.   CSW reviewed PT/OT recommendations for SNF. Pt reports being okay with rehab. Pt gave CSW permission to fax out to facilities in the area. Pt daughter would like [facility] in [place] although pt lives in [place] she could easily visit pt.  CSW contacted [facility] and left a message for pt bed availability.  CSW will continue to follow.    Expected Discharge Plan: Skilled Nursing Facility Barriers to Discharge: Continued Medical Work up   Patient Goals and CMS Choice Patient states their goals for this hospitalization and ongoing recovery are:: Rehab CMS Medicare.gov Compare Post Acute Care list provided to:: Patient Choice offered to / list presented to : Patient  Expected Discharge Plan and Services Expected Discharge Plan: Skilled Nursing Facility In-house Referral: Clinical Social Work Discharge Planning Services: NA Post Acute Care Choice: Skilled Nursing Facility Living arrangements for the past 2 months: Assisted Living Facility                                      Prior Living Arrangements/Services Living arrangements for the past 2 months: Assisted Living Facility Lives with:: Facility Resident Patient language and need for interpreter reviewed:: Yes Do you feel safe going back to the place where you live?: Yes      Need for Family Participation in Patient  Care: Yes (Comment) Care giver support system in place?: Yes (comment)   Criminal Activity/Legal Involvement Pertinent to Current Situation/Hospitalization: No - Comment as needed  Activities of Daily Living      Permission Sought/Granted Permission sought to share information with : Family Supports, Oceanographer granted to share information with : Yes, Verbal Permission Granted  Share Information with NAME: Maily, Debarge  Permission granted to share info w AGENCY: SNF and ALF [employer] granted to share info w Relationship: (Daughter)  Permission granted to share info w Contact Information: [phone]  Emotional Assessment Appearance:: Appears stated age Attitude/Demeanor/Rapport: Unable to Assess Affect (typically observed): Unable to Assess Orientation: : Oriented to  Time, Oriented to Place, Oriented to Self Alcohol / Substance Use: Not Applicable Psych Involvement: No (comment)  Admission diagnosis:  Crushing chest pain [R07.9] Left hand pain [M79.642] Cellulitis of left upper extremity [L03.114] Chest pain [R07.9] Cellulitis [L03.90] Patient Active Problem List   Diagnosis Date Noted   Chronic diastolic CHF (congestive heart failure) (HCC) 09/05/2020   Cellulitis of hand 09/05/2020   Cellulitis 09/05/2020   Chest pain 09/04/2020   SOB (shortness of breath) on exertion 12/24/2019   Depressed mood 12/22/2019   Sinus node dysfunction (HCC) 11/10/2018   Venous insufficiency of both lower extremities 02/05/2018   Physical deconditioning 02/05/2018   Acute on chronic diastolic CHF (congestive heart failure) (HCC) 01/30/2018   Carpal  tunnel syndrome, bilateral 12/09/2017   Rheumatoid arthritis (HCC) 12/09/2017   Non compliance w medication regimen 12/09/2017   Acute on chronic diastolic heart failure (HCC) 10/03/2016   HLD (hyperlipidemia) 10/03/2016   History of cardiac pacemaker 2/2 CHB 10/03/2016   Morbid obesity  with BMI of 40.0-44.9, adult (HCC) 10/03/2016   Short-term memory loss 10/03/2016   Pressure injury of skin 10/03/2016   Incontinence 10/15/2015   Bullous rash 08/28/2015   Chronic anticoagulation 08/28/2015   Cerebrovascular disease 08/24/2014   Vasculitis (HCC) 04/12/2014   Neck mass 02/08/2013   Hyperlipidemia 01/13/2013   Type 2 diabetes mellitus (HCC) 11/30/2012   Diverticulosis 11/04/2011   History of (heterozygous) Hemochromatosis 10/10/2011   Nephrolithiasis - 0.3cm Right Ureter August 2013 10/07/2011   Shingles outbreak 10/01/2011   Dyslipidemia 09/27/2011   Essential hypertension 09/27/2011   CAD (coronary artery disease) 09/27/2011   Permanent atrial fibrillation (HCC) 09/27/2011   Sleep apnea 09/27/2011   History of gout 09/27/2011   Pacemaker 09/27/2011   Rheumatoid arthritis(714.0) 09/27/2011   Osteoporosis 09/27/2011   PCP:  Sunnie Nielsen, DO Pharmacy:   [facility] - [place], [place] - [address] [place] [address] Phone: [phone] Fax: [phone]  [facility] - [place], [place] - [address] [place] [address] Phone: [phone] Fax: [phone]     Social Determinants of Health (SDOH) Interventions    Readmission Risk Interventions No flowsheet data found.

## 2020-09-06 NOTE — Progress Notes (Signed)
Daughter updated at the bedside questions answered.

## 2020-09-06 NOTE — Consult Note (Signed)
   [facility] Inpatient Consult   09/06/2020  FORRESTINE LECRONE 1929-10-29 [id number]  [facility] [[facility]]  Accountable Care Organization [ACO] Patient: [employer] Medicare  Primary Care Provider:  Sunnie Nielsen, DO [facility] [place], Embedded provider for Chronic Care Management [CCM] program    Review of patient's medical record reveals patient is from [facility] and recently declined offer for Chronic Care Management.  Patient being recommended for Skilled Nursing Facility as discussed in HF progression meeting.  Plan:  Continue to follow progress and disposition to assess for post hospital care management needs for finalized disposition.    For questions contact:   Charlesetta Shanks, RN BSN CCM [facility]  [phone] business mobile phone Toll free office [phone]  Fax number: [phone] [email or web address]

## 2020-09-06 NOTE — [place] FL2 (Signed)
[place] MEDICAID FL2 LEVEL OF CARE SCREENING TOOL     IDENTIFICATION  Patient Name: Katherine Cervantes Birthdate: 1929/05/27 Sex: female Admission Date (Current Location): 09/04/2020  [facility] and [place] Number:  Producer, television/film/video and Address:  [facility], [address] [place], [place] [address]      Provider Number: [id number]  Attending Physician Name and Address:  Alba Cory, MD  Relative Name and Phone Number:  Alahna Dunne [phone] (daughter)    Current Level of Care: Hospital Recommended Level of Care: Skilled Nursing Facility Prior Approval Number:    Date Approved/Denied:   PASRR Number: [id number]  Discharge Plan: SNF    Current Diagnoses: Patient Active Problem List   Diagnosis Date Noted   Chronic diastolic CHF (congestive heart failure) (HCC) 09/05/2020   Cellulitis of hand 09/05/2020   Cellulitis 09/05/2020   Chest pain 09/04/2020   SOB (shortness of breath) on exertion 12/24/2019   Depressed mood 12/22/2019   Sinus node dysfunction (HCC) 11/10/2018   Venous insufficiency of both lower extremities 02/05/2018   Physical deconditioning 02/05/2018   Acute on chronic diastolic CHF (congestive heart failure) (HCC) 01/30/2018   Carpal tunnel syndrome, bilateral 12/09/2017   Rheumatoid arthritis (HCC) 12/09/2017   Non compliance w medication regimen 12/09/2017   Acute on chronic diastolic heart failure (HCC) 10/03/2016   HLD (hyperlipidemia) 10/03/2016   History of cardiac pacemaker 2/2 CHB 10/03/2016   Morbid obesity with BMI of 40.0-44.9, adult (HCC) 10/03/2016   Short-term memory loss 10/03/2016   Pressure injury of skin 10/03/2016   Incontinence 10/15/2015   Bullous rash 08/28/2015   Chronic anticoagulation 08/28/2015   Cerebrovascular disease 08/24/2014   Vasculitis (HCC) 04/12/2014   Neck mass 02/08/2013   Hyperlipidemia 01/13/2013   Type 2 diabetes mellitus (HCC) 11/30/2012   Diverticulosis 11/04/2011    History of (heterozygous) Hemochromatosis 10/10/2011   Nephrolithiasis - 0.3cm Right Ureter August 2013 10/07/2011   Shingles outbreak 10/01/2011   Dyslipidemia 09/27/2011   Essential hypertension 09/27/2011   CAD (coronary artery disease) 09/27/2011   Permanent atrial fibrillation (HCC) 09/27/2011   Sleep apnea 09/27/2011   History of gout 09/27/2011   Pacemaker 09/27/2011   Rheumatoid arthritis(714.0) 09/27/2011   Osteoporosis 09/27/2011    Orientation RESPIRATION BLADDER Height & Weight     Self  Normal Incontinent, External catheter Weight: 211 lb 13.8 oz (96.1 kg) Height:  5\' 1"  (154.9 cm)  BEHAVIORAL SYMPTOMS/MOOD NEUROLOGICAL BOWEL NUTRITION STATUS      Continent Diet (see dc summary)  AMBULATORY STATUS COMMUNICATION OF NEEDS Skin   Total Care Verbally Normal                       Personal Care Assistance Level of Assistance  Bathing, Feeding, Dressing Bathing Assistance: Limited assistance (can bath her upper parts but not lower) Feeding assistance: Limited assistance (need set up , and open up packages) Dressing Assistance: Limited assistance (can dress her upper body)     Functional Limitations Info  Sight, Hearing, Speech Sight Info: Adequate Hearing Info: Adequate Speech Info: Adequate    SPECIAL CARE FACTORS FREQUENCY  PT (By licensed PT), OT (By licensed OT)     PT Frequency: 5x/week OT Frequency: 5/week            Contractures Contractures Info: Not present    Additional Factors Info  Code Status, Allergies Code Status Info: DNR Allergies Info: Aspirin, Other, Penicillins, Codeine, Levofloxacin In  D5w, Morphine And Related, Sulfa Antibiotics, Ether, Metoprolol, Prednisone, Procaine           Current Medications (09/06/2020):  This is the current hospital active medication list Current Facility-Administered Medications  Medication Dose Route Frequency Provider Last Rate Last Admin   0.9 %  sodium chloride infusion  250 mL Intravenous  PRN Doutova, Anastassia, MD       acetaminophen (TYLENOL) tablet 650 mg  650 mg Oral Q6H PRN Doutova, Anastassia, MD       Or   acetaminophen (TYLENOL) suppository 650 mg  650 mg Rectal Q6H PRN Doutova, Anastassia, MD       albuterol (PROVENTIL) (2.5 MG/3ML) 0.083% nebulizer solution 2.5 mg  2.5 mg Inhalation Q4H PRN Doutova, Anastassia, MD       fentaNYL (SUBLIMAZE) injection 50 mcg  50 mcg Intravenous Q2H PRN Doutova, Anastassia, MD   50 mcg at 09/06/20 1152   furosemide (LASIX) tablet 80 mg  80 mg Oral BID Regalado, Belkys A, MD   80 mg at 09/06/20 1657   gabapentin (NEURONTIN) capsule 400 mg  400 mg Oral TID Therisa Doyne, MD   400 mg at 09/06/20 1656   insulin aspart (novoLOG) injection 0-9 Units  0-9 Units Subcutaneous Q4H Doutova, Anastassia, MD   2 Units at 09/06/20 1152   metoprolol succinate (TOPROL-XL) 24 hr tablet 50 mg  50 mg Oral Daily Doutova, Anastassia, MD   50 mg at 09/06/20 6546   nystatin (MYCOSTATIN/NYSTOP) topical powder   Topical TID Regalado, Belkys A, MD   Given at 09/06/20 1656   rivaroxaban (XARELTO) tablet 20 mg  20 mg Oral Daily Doutova, Anastassia, MD   20 mg at 09/06/20 0959   sodium chloride flush (NS) 0.9 % injection 3 mL  3 mL Intravenous Q12H Doutova, Anastassia, MD   3 mL at 09/06/20 0959   sodium chloride flush (NS) 0.9 % injection 3 mL  3 mL Intravenous PRN Doutova, Jonny Ruiz, MD       spironolactone (ALDACTONE) tablet 25 mg  25 mg Oral Daily Doutova, Anastassia, MD   25 mg at 09/06/20 0959   traMADol (ULTRAM) tablet 50 mg  50 mg Oral Q6H PRN Therisa Doyne, MD   50 mg at 09/06/20 0959   vancomycin (VANCOREADY) IVPB 750 mg/150 mL  750 mg Intravenous Q24H Juliette Mangle, RPH 150 mL/hr at 09/06/20 0243 750 mg at 09/06/20 0243     Discharge Medications: Please see discharge summary for a list of discharge medications.  Relevant Imaging Results:  Relevant Lab Results:   Additional Information [id number], [place]

## 2020-09-06 NOTE — Progress Notes (Signed)
Occupational Therapy Treatment Patient Details Name: Katherine Cervantes MRN: [id number] DOB: Feb 03, 1930 Today's Date: 09/06/2020    History of present illness Pt is an 87 y.o. female admitted from [facility] on 09/04/20 with severe chest pain and L hand swelling, pain, redness. Workup for L hand cellulitis. Per cardiology, suspect recurrent chest pain related to shingles. PMH includes recent shingles (R shoulder/chest), CHF, afib, pacemaker, HTN, DM2, obesity, chronic LE edema, asthma, anxiety, depression.   OT comments  Patient with 3/4 dyspnea with bed mobility and functional transfer to chair. Distracted due to thinking her daughter is "downstairs" and that she can hear her needing max redirection at times to participate in mobility. Patient mod A for scooting hip to edge of bed and max A x2 to pivot to recliner with rolling walker. Patient with posterior lean and unable to try and take steps towards chair. Informed RN try sara stedy back to bed from chair as patient also appears somewhat fearful of falling. Acute OT to follow.    Follow Up Recommendations  SNF;Supervision/Assistance - 24 hour (return to SNF)    Equipment Recommendations  None recommended by OT       Precautions / Restrictions Precautions Precautions: Fall;Other (comment) Precaution Comments: Bladder incontinence; orthopnea       Mobility Bed Mobility Overal bed mobility: Needs Assistance Bed Mobility: Supine to Sit     Supine to sit: Mod assist;HOB elevated     General bed mobility comments: with increased time and encouragement patient able to mobilize legs to edge of bed and turn trunk/assist with sitting upright using bed rail and HOB elevated. mod A with use of pad to pivot L hip to edge of bed    Transfers Overall transfer level: Needs assistance Equipment used: Rolling walker (2 wheeled) Transfers: Stand Pivot Transfers   Stand pivot transfers: Max assist;+2 physical assistance;+2 safety/equipment        General transfer comment: please see toilet transfer in ADL section; high fall risk    Balance Overall balance assessment: Needs assistance Sitting-balance support: Feet supported Sitting balance-Leahy Scale: Fair   Postural control: Posterior lean Standing balance support: Bilateral upper extremity supported Standing balance-Leahy Scale: Zero Standing balance comment: max A x2, posterior lean and UE support on walker                           ADL either performed or assessed with clinical judgement   ADL Overall ADL's : Needs assistance/impaired                         Toilet Transfer: Maximal assistance;+2 for physical assistance;+2 for safety/equipment;Cueing for safety;Cueing for sequencing;Stand-pivot;RW Toilet Transfer Details (indicate cue type and reason): to recliner; atient needing max A x2 to power up to standing, stabilize and cues to weight shift forward due to posterior lean. poor eccentric control into chair         Functional mobility during ADLs: Maximal assistance;+2 for physical assistance;+2 for safety/equipment;Cueing for safety;Cueing for sequencing;Rolling walker General ADL Comments: spoke with RN recommend using stedy to get back to bed as patient with posterior lean and difficulty turning feet      Cognition Arousal/Alertness: Awake/alert Behavior During Therapy: WFL for tasks assessed/performed Overall Cognitive Status: History of cognitive impairments - at baseline  General Comments: patient repeatedly stating her daughter is "downstairs" try to orient to patient being in hospital and hearing nurses in hallway patient states "bullsh*t I can hear her" needing multiple cues to redirect to ask                   Pertinent Vitals/ Pain       Pain Assessment: Faces Faces Pain Scale: No hurt         Frequency  Min 2X/week        Progress Toward Goals  OT Goals(current  goals can now be found in the care plan section)  Progress towards OT goals: Progressing toward goals  Acute Rehab OT Goals Patient Stated Goal: "where's my daughter" OT Goal Formulation: With patient Time For Goal Achievement: 09/19/20 Potential to Achieve Goals: Fair ADL Goals Pt Will Perform Grooming: with set-up;sitting Pt Will Transfer to Toilet: with min assist;ambulating Pt/caregiver will Perform Home Exercise Program: Increased ROM;Increased strength;Left upper extremity;With Supervision;With written HEP provided  Plan Discharge plan remains appropriate       AM-PAC OT "6 Clicks" Daily Activity     Outcome Measure   Help from another person eating meals?: A Little Help from another person taking care of personal grooming?: A Little Help from another person toileting, which includes using toliet, bedpan, or urinal?: Total Help from another person bathing (including washing, rinsing, drying)?: A Lot Help from another person to put on and taking off regular upper body clothing?: A Little Help from another person to put on and taking off regular lower body clothing?: Total 6 Click Score: 13    End of Session Equipment Utilized During Treatment: Gait belt;Rolling walker  OT Visit Diagnosis: Unsteadiness on feet (R26.81);Other abnormalities of gait and mobility (R26.89);Muscle weakness (generalized) (M62.81);Pain Pain - Right/Left: Left Pain - part of body: Hand;Arm   Activity Tolerance Patient limited by fatigue   Patient Left in chair;with call bell/phone within reach;with chair alarm set   Nurse Communication Mobility status;Other (comment) (use sara stedy back to bed)        Time: 9417-4081 OT Time Calculation (min): 25 min  Charges: OT General Charges $OT Visit: 1 Visit OT Treatments $Self Care/Home Management : 23-37 mins  Marlyce Huge OT OT pager: [phone]   Carmelia Roller 09/06/2020, 9:10 AM

## 2020-09-07 ENCOUNTER — Encounter (HOSPITAL_COMMUNITY): Payer: Self-pay | Admitting: Internal Medicine

## 2020-09-07 LAB — SARS CORONAVIRUS 2 (TAT 6-24 HRS): SARS Coronavirus 2: NEGATIVE

## 2020-09-07 LAB — BASIC METABOLIC PANEL
Anion gap: 10 (ref 5–15)
BUN: 25 mg/dL — ABNORMAL HIGH (ref 8–23)
CO2: 27 mmol/L (ref 22–32)
Calcium: 9 mg/dL (ref 8.9–10.3)
Chloride: 95 mmol/L — ABNORMAL LOW (ref 98–111)
Creatinine, Ser: 0.97 mg/dL (ref 0.44–1.00)
GFR, Estimated: 56 mL/min — ABNORMAL LOW (ref >60)
Glucose, Bld: 119 mg/dL — ABNORMAL HIGH (ref 70–99)
Potassium: 3.5 mmol/L (ref 3.5–5.1)
Sodium: 132 mmol/L — ABNORMAL LOW (ref 135–145)

## 2020-09-07 LAB — CBC WITH DIFFERENTIAL/PLATELET
Abs Immature Granulocytes: 0.08 10*3/uL — ABNORMAL HIGH (ref 0.00–0.07)
Basophils Absolute: 0 10*3/uL (ref 0.0–0.1)
Basophils Relative: 0 %
Eosinophils Absolute: 0.1 10*3/uL (ref 0.0–0.5)
Eosinophils Relative: 1 %
HCT: 37.8 % (ref 36.0–46.0)
Hemoglobin: 12.2 g/dL (ref 12.0–15.0)
Immature Granulocytes: 1 %
Lymphocytes Relative: 12 %
Lymphs Abs: 1.1 10*3/uL (ref 0.7–4.0)
MCH: 29 pg (ref 26.0–34.0)
MCHC: 32.3 g/dL (ref 30.0–36.0)
MCV: 89.8 fL (ref 80.0–100.0)
Monocytes Absolute: 1.2 10*3/uL — ABNORMAL HIGH (ref 0.1–1.0)
Monocytes Relative: 13 %
Neutro Abs: 7 10*3/uL (ref 1.7–7.7)
Neutrophils Relative %: 73 %
Platelets: 270 10*3/uL (ref 150–400)
RBC: 4.21 MIL/uL (ref 3.87–5.11)
RDW: 15.4 % (ref 11.5–15.5)
WBC: 9.6 10*3/uL (ref 4.0–10.5)
nRBC: 0 % (ref 0.0–0.2)

## 2020-09-07 LAB — GLUCOSE, CAPILLARY
Glucose-Capillary: 105 mg/dL — ABNORMAL HIGH (ref 70–99)
Glucose-Capillary: 122 mg/dL — ABNORMAL HIGH (ref 70–99)
Glucose-Capillary: 126 mg/dL — ABNORMAL HIGH (ref 70–99)
Glucose-Capillary: 129 mg/dL — ABNORMAL HIGH (ref 70–99)
Glucose-Capillary: 129 mg/dL — ABNORMAL HIGH (ref 70–99)
Glucose-Capillary: 163 mg/dL — ABNORMAL HIGH (ref 70–99)

## 2020-09-07 MED ORDER — CLINDAMYCIN HCL 150 MG PO CAPS: 300.0000 mg | ORAL_CAPSULE | Freq: Four times a day (QID) | ORAL | 0 refills | Status: DC

## 2020-09-07 MED ORDER — TRAMADOL HCL 50 MG PO TABS: 50.0000 mg | ORAL_TABLET | Freq: Four times a day (QID) | ORAL | 0 refills | Status: AC | PRN

## 2020-09-07 NOTE — Telephone Encounter (Signed)
This encounter was created in error - please disregard.

## 2020-09-07 NOTE — TOC Progression Note (Addendum)
Transition of Care [facility]) - Progression Note    Patient Details  Name: Katherine Cervantes MRN: [id number] Date of Birth: 1929/05/13  Transition of Care [facility]) CM/SW Contact  Ivette Loyal, [place] Phone Number: 09/07/2020, 11:34 AM  Clinical Narrative:    CSW contacted Misty at [facility] in [place] for bed availability, Misty states that she will look at pt in the hub and follow up on bed placement.   CSW followed up with [facility] with no answer, CSW left a VM for Misty about pt DC. CSW will follow up tomorrow.  Expected Discharge Plan: Skilled Nursing Facility Barriers to Discharge: Continued Medical Work up  Expected Discharge Plan and Services Expected Discharge Plan: Skilled Nursing Facility In-house Referral: Clinical Social Work Discharge Planning Services: NA Post Acute Care Choice: Skilled Nursing Facility Living arrangements for the past 2 months: Assisted Living Facility Expected Discharge Date: 09/07/20                                     Social Determinants of Health (SDOH) Interventions    Readmission Risk Interventions No flowsheet data found.

## 2020-09-07 NOTE — Progress Notes (Signed)
Pt refusing to return to get out of chair.

## 2020-09-07 NOTE — Discharge Summary (Addendum)
Physician Discharge Summary  Katherine Cervantes ZOX:[id number] DOB: 1930/02/10 DOA: 09/04/2020  PCP: Sunnie Nielsen, DO  Admit date: 09/04/2020 Discharge date: 09/07/2020  Admitted From: ALF Disposition: SNF  Recommendations for Outpatient Follow-up:  Follow up with PCP in 1-2 weeks Please obtain BMP/CBC in one week Follow up on resolution of cellulitis.  Repeat  A1c in 3 months.  Follow carb modified diet.   Discharge Condition: Stable. CODE STATUS: DNR Diet recommendation: Heart Healthy / Carb Modified /   Brief/Interim Summary: 87 year old with past medical history significant for CAD, permanent A. fib, pacemaker secondary to sinus node dysfunction, presented to the ED with left hand cellulitis and chest pain. Neurology consulted for chest pain, no further recommendation, chest pain likely due to recent shingles outbreak.  Troponin unremarkable.    1-Left upper extremity cellulitis: Doppler negative for DVT X-ray show soft tissue swelling Plan to continue with vancomycin Redness and Edema has significantly improved Will plan to treat for 7 days. Will plan to discharge on clindamycin for 5 more  days.    2-Chest pain: Atypical: Cardiology consulted no further cardiac work-up planned at 3-Sleep apnea: Patient not on CPAP Permanent A. fib: Continue with Xarelto and Toprol Status post pacemaker for sick sinus syndrome Hypertension: CAD: Continue with beta-blocker Chronic diastolic heart failure: She was transition  from IV Lasix to oral Lasix 7/27. Continue with the spironolactone  DM ; A1 6.5. Continue with Diet out patient.  Hyponatremia; Mild monitor.  Discharge to SNF when bed available.  Discharge Diagnoses:  Active Problems:   Essential hypertension   CAD (coronary artery disease)   Permanent atrial fibrillation (HCC)   Sleep apnea   Pacemaker   Type 2 diabetes mellitus (HCC)   Hyperlipidemia   Morbid obesity with BMI of 40.0-44.9, adult (HCC)   Chronic  anticoagulation   Venous insufficiency of both lower extremities   Chest pain   Chronic diastolic CHF (congestive heart failure) (HCC)   Cellulitis of hand   Cellulitis    Discharge Instructions  Discharge Instructions     Diet - low sodium heart healthy   Complete by: As directed    Increase activity slowly   Complete by: As directed       Allergies as of 09/07/2020       Reactions   Aspirin Anaphylaxis   Other Anaphylaxis   Penicillins Anaphylaxis   Anaphylaxis   Codeine Other (See Comments)   SOB Asthmatic issues   Levofloxacin In D5w Other (See Comments)   Disoriented, Hallucinations Other reaction(s): Other Disoriented, Hallucinations   Morphine And Related Other (See Comments)   Anaphylaxis   Sulfa Antibiotics Rash   Ether    Metoprolol    Possible Per MAR   Prednisone Other (See Comments)   Lethargic, rash   Procaine         Medication List     TAKE these medications    acetaminophen 325 MG tablet Commonly known as: TYLENOL Take 2 tablets (650 mg total) by mouth every 4 (four) hours as needed for headache or mild pain. What changed:  when to take this reasons to take this   albuterol 108 (90 Base) MCG/ACT inhaler Commonly known as: VENTOLIN HFA Inhale 1-2 puffs into the lungs every 4 (four) hours as needed for wheezing or shortness of breath. What changed:  how much to take when to take this reasons to take this   clindamycin 150 MG capsule Commonly known as: Cleocin Take 2 capsules (300 mg total)  by mouth 4 (four) times daily for 5 days.   fluconazole 150 MG tablet Commonly known as: DIFLUCAN Take 150 mg by mouth once a week.   furosemide 80 MG tablet Commonly known as: LASIX Take 1 tablet (80 mg total) by mouth 2 (two) times daily.   gabapentin 400 MG capsule Commonly known as: NEURONTIN Take 400 mg by mouth See admin instructions.  in the morning  twice daily   Lidocaine 4 % Ptch Apply 1 patch topically daily.  Apply to right chest topically one time a day for pain at 0800, remove at 2000   metoprolol succinate 50 MG 24 hr tablet Commonly known as: TOPROL-XL Take 1 tablet (50 mg total) by mouth daily.   nitroGLYCERIN 0.4 MG SL tablet Commonly known as: NITROSTAT Place 1 tablet (0.4 mg total) under the tongue every 5 (five) minutes as needed for chest pain (MAX 3 TABLETS).   nystatin powder Commonly known as: MYCOSTATIN/NYSTOP Apply 1 application topically 2 (two) times daily. Apply to breast folds   potassium chloride 10 MEQ tablet Commonly known as: KLOR-CON Take 10 mEq by mouth daily.   rivaroxaban 20 MG Tabs tablet Commonly known as: Xarelto Take 1 tablet (20 mg total) by mouth daily.   traMADol 50 MG tablet Commonly known as: ULTRAM Take 1 tablet (50 mg total) by mouth every 6 (six) hours as needed for severe pain.       ASK your doctor about these medications    spironolactone 25 MG tablet Commonly known as: ALDACTONE Take 1 tablet (25 mg total) by mouth daily.        Allergies  Allergen Reactions   Aspirin Anaphylaxis   Other Anaphylaxis   Penicillins Anaphylaxis    Anaphylaxis   Codeine Other (See Comments)    SOB Asthmatic issues   Levofloxacin In D5w Other (See Comments)    Disoriented, Hallucinations Other reaction(s): Other Disoriented, Hallucinations   Morphine And Related Other (See Comments)    Anaphylaxis   Sulfa Antibiotics Rash   Ether    Metoprolol     Possible  Per MAR   Prednisone Other (See Comments)    Lethargic, rash   Procaine     Consultations: Cardiology    Procedures/Studies: DG Chest 2 View  Result Date: 09/04/2020 CLINICAL DATA:  Chest pain. EXAM: CHEST - 2 VIEW COMPARISON:  May 23, 2020. FINDINGS: Stable cardiomegaly. Left-sided pacemaker is unchanged in position. No pneumothorax or pleural effusion is noted. Lungs are clear. Old right rib fractures are noted. IMPRESSION: No active cardiopulmonary disease. Aortic  Atherosclerosis (ICD10-I70.0). Electronically Signed   By: [name] M.D.   On: 09/04/2020 14:50   US Venous Img Upper Uni Left  Result Date: 09/04/2020 CLINICAL DATA:  87 year old with left hand and arm swelling. EXAM: LEFT UPPER EXTREMITY VENOUS DOPPLER ULTRASOUND TECHNIQUE: Gray-scale sonography with graded compression, as well as color Doppler and duplex ultrasound were performed to evaluate the upper extremity deep venous system from the level of the subclavian vein and including the jugular, axillary, basilic, radial, ulnar and upper cephalic vein. Spectral Doppler was utilized to evaluate flow at rest and with distal augmentation maneuvers. COMPARISON:  None. FINDINGS: Contralateral Subclavian Vein: No evidence of thrombus. Normal color Doppler flow. Internal Jugular Vein: No evidence of thrombus. Normal compressibility, color flow and phasicity. Subclavian Vein: No evidence of thrombus. Normal compressibility, color Doppler flow and phasicity. Axillary Vein: No evidence of thrombus. Normal compressibility, color Doppler flow and augmentation. Cephalic Vein: No  evidence of thrombus. Normal compressibility and color Doppler flow. Basilic Vein: No evidence of thrombus. Normal compressibility and color Doppler flow. Brachial Veins: No evidence of thrombus. Normal compressibility and color Doppler flow. Radial Veins: No evidence of thrombus.  Normal compressibility. Ulnar Veins: No evidence of thrombus.  Normal compressibility. Other Findings:  None visualized. IMPRESSION: No evidence of DVT within the left upper extremity. Electronically Signed   By: Richarda Overlie M.D.   On: 09/04/2020 15:04   DG Hand Complete Left  Result Date: 09/04/2020 CLINICAL DATA:  Left hand pain and swelling. EXAM: LEFT HAND - COMPLETE 3+ VIEW COMPARISON:  May 09, 2009. FINDINGS: No acute fracture or dislocation is noted. Resorption of the proximal portion of the scaphoid bone is noted most consistent with avascular necrosis  from old injury. Mild degenerative changes seen involving the first carpometacarpal joint. Soft tissue swelling is seen involving the second and third fingers. Moderate degenerative changes seen involving the third metacarpophalangeal joint. IMPRESSION: Degenerative joint disease is seen involving the first carpometacarpal joint as well as the third metacarpophalangeal joint. Soft tissue swelling is seen involving the second and third fingers. 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 No acute fracture or dislocation is noted. Electronically Signed   By: [name] M.D.   On: 09/04/2020 14:54   CUP PACEART INCLINIC DEVICE CHECK  Result Date: 08/10/2020 Pacemaker check in clinic. Normal device function. Thresholds, sensing, impedances consistent with previous measurements. Device programmed to maximize longevity no high ventricular rates noted. Device programmed at appropriate safety margins. Histogram distribution appropriate for patient activity level. Device programmed to optimize intrinsic conduction. Estimated longevity  10 years__. Patient enrolled in remote follow-up. Patient education completed. Pacemaker check in clinic. Normal device function. Thresholds, sensing, impedances consistent with previous measurements. Device programmed to maximize longevity no high ventricular rates noted. Device programmed at appropriate safety margins. Histogram distribution appropriate for patient activity level. Device programmed to optimize intrinsic conduction. Estimated longevity  10 years__. Patient enrolled in remote follow-up. Patient education completed.  CUP PACEART REMOTE DEVICE CHECK  Result Date: 08/19/2020 Scheduled remote reviewed. Normal device function.  Ventricular autocapture frequently out of range Next remote 91 days.    Subjective: Denies dyspnea. Left hand redness and swelling improved.    Discharge Exam: Vitals:   09/06/20 2034 09/07/20 0326  BP: (!) 154/58  (!) 147/72  Pulse: (!) 59 61  Resp: 17 17  Temp: 97.7 F (36.5 C) 98 F (36.7 C)  SpO2: 98% 93%     General: Pt is alert, awake, not in acute distress Cardiovascular: RRR, S1/S2 +, no rubs, no gallops Respiratory: CTA bilaterally, no wheezing, no rhonchi Abdominal: Soft, NT, ND, bowel sounds + Extremities: no edema, no cyanosis, left hand redness improved    The results of significant diagnostics from this hospitalization (including imaging, microbiology, ancillary and laboratory) are listed below for reference.     Microbiology: Recent Results (from the past 240 hour(s))  Resp Panel by RT-PCR (Flu A&B, Covid) Nasopharyngeal Swab     Status: None   Collection Time: 09/04/20  3:19 PM   Specimen: Nasopharyngeal Swab; Nasopharyngeal(NP) swabs in vial transport medium  Result Value Ref Range Status   SARS Coronavirus 2 by RT PCR NEGATIVE NEGATIVE Final    Comment: (NOTE) SARS-CoV-2 target nucleic acids are NOT DETECTED.  The SARS-CoV-2 RNA is generally detectable in upper respiratory specimens during the acute phase of infection. The lowest concentration of SARS-CoV-2 viral copies this assay  can detect is 138 copies/mL. A negative result does not preclude SARS-Cov-2 infection and should not be used as the sole basis for treatment or other patient management decisions. A negative result may occur with  improper specimen collection/handling, submission of specimen other than nasopharyngeal swab, presence of viral mutation(s) within the areas targeted by this assay, and inadequate number of viral copies(<138 copies/mL). A negative result must be combined with clinical observations, patient history, and epidemiological information. The expected result is Negative.  Fact Sheet for Patients:  BloggerCourse.com  Fact Sheet for Healthcare Providers:  SeriousBroker.it  This test is no t yet approved or cleared by the [place]  FDA and  has been authorized for detection and/or diagnosis of SARS-CoV-2 by FDA under an Emergency Use Authorization (EUA). This EUA will remain  in effect (meaning this test can be used) for the duration of the COVID-19 declaration under Section 564(b)(1) of the Act, 21 U.S.C.section 360bbb-3(b)(1), unless the authorization is terminated  or revoked sooner.       Influenza A by PCR NEGATIVE NEGATIVE Final   Influenza B by PCR NEGATIVE NEGATIVE Final    Comment: (NOTE) The Xpert Xpress SARS-CoV-2/FLU/RSV plus assay is intended as an aid in the diagnosis of influenza from Nasopharyngeal swab specimens and should not be used as a sole basis for treatment. Nasal washings and aspirates are unacceptable for Xpert Xpress SARS-CoV-2/FLU/RSV testing.  Fact Sheet for Patients: BloggerCourse.com  Fact Sheet for Healthcare Providers: SeriousBroker.it  This test is not yet approved or cleared by the [place] FDA and has been authorized for detection and/or diagnosis of SARS-CoV-2 by FDA under an Emergency Use Authorization (EUA). This EUA will remain in effect (meaning this test can be used) for the duration of the COVID-19 declaration under Section 564(b)(1) of the Act, 21 U.S.C. section 360bbb-3(b)(1), unless the authorization is terminated or revoked.  Performed at [facility], [address] [place], [place] [address]   MRSA Next Gen by PCR, Nasal     Status: None   Collection Time: 09/05/20  4:35 AM   Specimen: Nasal Mucosa; Nasal Swab  Result Value Ref Range Status   MRSA by PCR Next Gen NOT DETECTED NOT DETECTED Final    Comment: (NOTE) The GeneXpert MRSA Assay (FDA approved for NASAL specimens only), is one component of a comprehensive MRSA colonization surveillance program. It is not intended to diagnose MRSA infection nor to guide or monitor treatment for MRSA infections. Test performance is not FDA  approved in patients less than 12 years old. Performed at [facility], [address] [place], [place] [address]   SARS CORONAVIRUS 2 (TAT 6-24 HRS) Nasopharyngeal Nasopharyngeal Swab     Status: None   Collection Time: 09/07/20  1:39 AM   Specimen: Nasopharyngeal Swab  Result Value Ref Range Status   SARS Coronavirus 2 NEGATIVE NEGATIVE Final    Comment: (NOTE) SARS-CoV-2 target nucleic acids are NOT DETECTED.  The SARS-CoV-2 RNA is generally detectable in upper and lower respiratory specimens during the acute phase of infection. Negative results do not preclude SARS-CoV-2 infection, do not rule out co-infections with other pathogens, and should not be used as the sole basis for treatment or other patient management decisions. Negative results must be combined with clinical observations, patient history, and epidemiological information. The expected result is Negative.  Fact Sheet for Patients: HairSlick.no  Fact Sheet for Healthcare Providers: quierodirigir.com  This test is not yet approved or cleared by the  [place] [employer] and  has been authorized for detection and/or diagnosis of SARS-CoV-2 by FDA under an TEFL teacher (EUA). This EUA will remain  in effect (meaning this test can be used) for the duration of the COVID-19 declaration under Se ction 564(b)(1) of the Act, 21 U.S.C. section 360bbb-3(b)(1), unless the authorization is terminated or revoked sooner.  Performed at [facility], [address] [place], [place] [address]      Labs: BNP (last 3 results) Recent Labs    12/31/19 1340 02/23/20 1202 09/04/20 1600  BNP 257* 215* 386.8*   Basic Metabolic Panel: Recent Labs  Lab 09/04/20 1600 09/05/20 0315 09/06/20 0218 09/07/20 0258  NA 136 137 138 132*  K 3.2* 3.0* 3.7 3.5  CL 95* 98 99 95*  CO2 31 29 32 27  GLUCOSE 127* 122* 101* 119*  BUN 25* 21 22 25*  CREATININE  0.89 0.78 0.91 0.97  CALCIUM 8.7* 8.8* 9.0 9.0  MG  --  1.9  --   --   PHOS  --  2.9  --   --    Liver Function Tests: Recent Labs  Lab 09/04/20 1600 09/05/20 0315  AST 16 15  ALT 10 11  ALKPHOS 47 39  BILITOT 0.9 1.5*  PROT 7.8 7.2  ALBUMIN 3.1* 2.8*   Recent Labs  Lab 09/04/20 1600  LIPASE 31   No results for input(s): AMMONIA in the last 168 hours. CBC: Recent Labs  Lab 09/04/20 1600 09/05/20 0315 09/06/20 0218 09/07/20 0258  WBC 10.1 8.3 6.8 9.6  NEUTROABS 7.3 6.0 4.2 7.0  HGB 12.4 12.9 12.7 12.2  HCT 37.6 38.7 38.6 37.8  MCV 89.3 89.8 90.4 89.8  PLT 232 230 245 270   Cardiac Enzymes: No results for input(s): CKTOTAL, CKMB, CKMBINDEX, TROPONINI in the last 168 hours. BNP: Invalid input(s): POCBNP CBG: Recent Labs  Lab 09/06/20 1650 09/06/20 2029 09/07/20 0013 09/07/20 0326 09/07/20 0754  GLUCAP 81 120* 126* 122* 163*   D-Dimer No results for input(s): DDIMER in the last 72 hours. Hgb A1c Recent Labs    09/05/20 0315  HGBA1C 6.5*   Lipid Profile No results for input(s): CHOL, HDL, LDLCALC, TRIG, CHOLHDL, LDLDIRECT in the last 72 hours. Thyroid function studies Recent Labs    09/05/20 0315  TSH 3.189   Anemia work up No results for input(s): VITAMINB12, FOLATE, FERRITIN, TIBC, IRON, RETICCTPCT in the last 72 hours. Urinalysis    Component Value Date/Time   COLORURINE YELLOW 05/23/2020 2000   APPEARANCEUR CLEAR 05/23/2020 2000   LABSPEC 1.010 05/23/2020 2000   PHURINE 7.0 05/23/2020 2000   GLUCOSEU NEGATIVE 05/23/2020 2000   HGBUR NEGATIVE 05/23/2020 2000   BILIRUBINUR NEGATIVE 05/23/2020 2000   BILIRUBINUR negative 10/28/2017 0939   BILIRUBINUR NEGATIVE 07/04/2016 0845   KETONESUR NEGATIVE 05/23/2020 2000   PROTEINUR NEGATIVE 05/23/2020 2000   UROBILINOGEN 1.0 10/28/2017 0939   NITRITE NEGATIVE 05/23/2020 2000   LEUKOCYTESUR NEGATIVE 05/23/2020 2000   Sepsis Labs Invalid input(s): PROCALCITONIN,  WBC,   LACTICIDVEN Microbiology Recent Results (from the past 240 hour(s))  Resp Panel by RT-PCR (Flu A&B, Covid) Nasopharyngeal Swab     Status: None   Collection Time: 09/04/20  3:19 PM   Specimen: Nasopharyngeal Swab; Nasopharyngeal(NP) swabs in vial transport medium  Result Value Ref Range Status   SARS Coronavirus 2 by RT PCR NEGATIVE NEGATIVE Final    Comment: (NOTE) SARS-CoV-2 target nucleic acids are NOT DETECTED.  The SARS-CoV-2 RNA is  generally detectable in upper respiratory specimens during the acute phase of infection. The lowest concentration of SARS-CoV-2 viral copies this assay can detect is 138 copies/mL. A negative result does not preclude SARS-Cov-2 infection and should not be used as the sole basis for treatment or other patient management decisions. A negative result may occur with  improper specimen collection/handling, submission of specimen other than nasopharyngeal swab, presence of viral mutation(s) within the areas targeted by this assay, and inadequate number of viral copies(<138 copies/mL). A negative result must be combined with clinical observations, patient history, and epidemiological information. The expected result is Negative.  Fact Sheet for Patients:  BloggerCourse.com  Fact Sheet for Healthcare Providers:  SeriousBroker.it  This test is no t yet approved or cleared by the [place] FDA and  has been authorized for detection and/or diagnosis of SARS-CoV-2 by FDA under an Emergency Use Authorization (EUA). This EUA will remain  in effect (meaning this test can be used) for the duration of the COVID-19 declaration under Section 564(b)(1) of the Act, 21 U.S.C.section 360bbb-3(b)(1), unless the authorization is terminated  or revoked sooner.       Influenza A by PCR NEGATIVE NEGATIVE Final   Influenza B by PCR NEGATIVE NEGATIVE Final    Comment: (NOTE) The Xpert Xpress SARS-CoV-2/FLU/RSV plus  assay is intended as an aid in the diagnosis of influenza from Nasopharyngeal swab specimens and should not be used as a sole basis for treatment. Nasal washings and aspirates are unacceptable for Xpert Xpress SARS-CoV-2/FLU/RSV testing.  Fact Sheet for Patients: BloggerCourse.com  Fact Sheet for Healthcare Providers: SeriousBroker.it  This test is not yet approved or cleared by the [place] FDA and has been authorized for detection and/or diagnosis of SARS-CoV-2 by FDA under an Emergency Use Authorization (EUA). This EUA will remain in effect (meaning this test can be used) for the duration of the COVID-19 declaration under Section 564(b)(1) of the Act, 21 U.S.C. section 360bbb-3(b)(1), unless the authorization is terminated or revoked.  Performed at [facility], [address] [place], [place] [address]   MRSA Next Gen by PCR, Nasal     Status: None   Collection Time: 09/05/20  4:35 AM   Specimen: Nasal Mucosa; Nasal Swab  Result Value Ref Range Status   MRSA by PCR Next Gen NOT DETECTED NOT DETECTED Final    Comment: (NOTE) The GeneXpert MRSA Assay (FDA approved for NASAL specimens only), is one component of a comprehensive MRSA colonization surveillance program. It is not intended to diagnose MRSA infection nor to guide or monitor treatment for MRSA infections. Test performance is not FDA approved in patients less than 10 years old. Performed at [facility], [address] [place], [place] [address]   SARS CORONAVIRUS 2 (TAT 6-24 HRS) Nasopharyngeal Nasopharyngeal Swab     Status: None   Collection Time: 09/07/20  1:39 AM   Specimen: Nasopharyngeal Swab  Result Value Ref Range Status   SARS Coronavirus 2 NEGATIVE NEGATIVE Final    Comment: (NOTE) SARS-CoV-2 target nucleic acids are NOT DETECTED.  The SARS-CoV-2 RNA is generally detectable in upper and lower respiratory specimens during the  acute phase of infection. Negative results do not preclude SARS-CoV-2 infection, do not rule out co-infections with other pathogens, and should not be used as the sole basis for treatment or other patient management decisions. Negative results must be combined with clinical observations, patient history, and epidemiological information. The expected result is Negative.  Fact Sheet  for Patients: HairSlick.no  Fact Sheet for Healthcare Providers: quierodirigir.com  This test is not yet approved or cleared by the [place] FDA and  has been authorized for detection and/or diagnosis of SARS-CoV-2 by FDA under an Emergency Use Authorization (EUA). This EUA will remain  in effect (meaning this test can be used) for the duration of the COVID-19 declaration under Se ction 564(b)(1) of the Act, 21 U.S.C. section 360bbb-3(b)(1), unless the authorization is terminated or revoked sooner.  Performed at [facility], [address], [place], [place] [address]      Time coordinating discharge: 40 minutes  SIGNED:   Alba Cory, MD  [facility]

## 2020-09-08 DIAGNOSIS — M79642 Pain in left hand: Secondary | ICD-10-CM | POA: Diagnosis not present

## 2020-09-08 LAB — CBC WITH DIFFERENTIAL/PLATELET
Abs Immature Granulocytes: 0.03 10*3/uL (ref 0.00–0.07)
Basophils Absolute: 0 10*3/uL (ref 0.0–0.1)
Basophils Relative: 0 %
Eosinophils Absolute: 0.2 10*3/uL (ref 0.0–0.5)
Eosinophils Relative: 4 %
HCT: 36.3 % (ref 36.0–46.0)
Hemoglobin: 11.7 g/dL — ABNORMAL LOW (ref 12.0–15.0)
Immature Granulocytes: 0 %
Lymphocytes Relative: 19 %
Lymphs Abs: 1.3 10*3/uL (ref 0.7–4.0)
MCH: 28.9 pg (ref 26.0–34.0)
MCHC: 32.2 g/dL (ref 30.0–36.0)
MCV: 89.6 fL (ref 80.0–100.0)
Monocytes Absolute: 0.9 10*3/uL (ref 0.1–1.0)
Monocytes Relative: 14 %
Neutro Abs: 4.2 10*3/uL (ref 1.7–7.7)
Neutrophils Relative %: 63 %
Platelets: 276 10*3/uL (ref 150–400)
RBC: 4.05 MIL/uL (ref 3.87–5.11)
RDW: 15.3 % (ref 11.5–15.5)
WBC: 6.7 10*3/uL (ref 4.0–10.5)
nRBC: 0 % (ref 0.0–0.2)

## 2020-09-08 LAB — GLUCOSE, CAPILLARY
Glucose-Capillary: 107 mg/dL — ABNORMAL HIGH (ref 70–99)
Glucose-Capillary: 111 mg/dL — ABNORMAL HIGH (ref 70–99)
Glucose-Capillary: 121 mg/dL — ABNORMAL HIGH (ref 70–99)
Glucose-Capillary: 128 mg/dL — ABNORMAL HIGH (ref 70–99)
Glucose-Capillary: 129 mg/dL — ABNORMAL HIGH (ref 70–99)
Glucose-Capillary: 156 mg/dL — ABNORMAL HIGH (ref 70–99)
Glucose-Capillary: 164 mg/dL — ABNORMAL HIGH (ref 70–99)

## 2020-09-08 LAB — BASIC METABOLIC PANEL
Anion gap: 9 (ref 5–15)
BUN: 32 mg/dL — ABNORMAL HIGH (ref 8–23)
CO2: 28 mmol/L (ref 22–32)
Calcium: 8.8 mg/dL — ABNORMAL LOW (ref 8.9–10.3)
Chloride: 97 mmol/L — ABNORMAL LOW (ref 98–111)
Creatinine, Ser: 0.94 mg/dL (ref 0.44–1.00)
GFR, Estimated: 58 mL/min — ABNORMAL LOW (ref >60)
Glucose, Bld: 129 mg/dL — ABNORMAL HIGH (ref 70–99)
Potassium: 3.3 mmol/L — ABNORMAL LOW (ref 3.5–5.1)
Sodium: 134 mmol/L — ABNORMAL LOW (ref 135–145)

## 2020-09-08 MED ORDER — POTASSIUM CHLORIDE CRYS ER 20 MEQ PO TBCR
40.0000 meq | EXTENDED_RELEASE_TABLET | Freq: Once | ORAL | Status: AC
  Administered 2020-09-08: 40 meq via ORAL
  Filled 2020-09-08: qty 2

## 2020-09-08 NOTE — Progress Notes (Signed)
Physical Therapy Treatment Patient Details Name: Katherine Cervantes MRN: [id number] DOB: [birth year] Today's Date: 09/08/2020    History of Present Illness Pt is an 87 y.o. female admitted from [facility] on 09/04/20 with severe chest pain and L hand swelling, pain, redness. Workup for L hand cellulitis. Per cardiology, suspect recurrent chest pain related to shingles. PMH includes recent shingles (R shoulder/chest), CHF, afib, pacemaker, HTN, DM2, obesity, chronic LE edema, asthma, anxiety, depression.    PT Comments    Pt limited by fatigue during session, also self-limiting at times. Pt remains weak, requiring physical assistance to power up into standing. Pt is anxious with mobility attempts and declines attempts at weight shifting or further transfers. Pt will benefit from continued aggressive mobilization to improve strength and reduce falls risk.   Follow Up Recommendations  SNF;Supervision/Assistance - 24 hour     Equipment Recommendations  Wheelchair (measurements PT);Wheelchair cushion (measurements PT)    Recommendations for Other Services       Precautions / Restrictions Precautions Precautions: Fall;Other (comment) Precaution Comments: Bladder incontinence; orthopnea Restrictions Weight Bearing Restrictions: No    Mobility  Bed Mobility Overal bed mobility: Needs Assistance Bed Mobility: Rolling Rolling: Mod assist         General bed mobility comments: pt rolls in recliner, needing assist for pelvic rotation and LE management    Transfers Overall transfer level: Needs assistance Equipment used: Rolling walker (2 wheeled) Transfers: Sit to/from Stand Sit to Stand: Mod assist;+2 physical assistance         General transfer comment: PT and daughter assist laterally, PT also providing L knee block for safety. Cues for trunk and hip extension  Ambulation/Gait Ambulation/Gait assistance:  (pt declines attempts at initiating gait)                Stairs             Wheelchair Mobility    Modified Rankin (Stroke Patients Only)       Balance Overall balance assessment: Needs assistance Sitting-balance support: No upper extremity supported;Feet supported Sitting balance-Leahy Scale: Fair     Standing balance support: Bilateral upper extremity supported Standing balance-Leahy Scale: Poor Standing balance comment: minA x2 with UE support of RW                            Cognition Arousal/Alertness: Awake/alert Behavior During Therapy: WFL for tasks assessed/performed Overall Cognitive Status: History of cognitive impairments - at baseline                                 General Comments: pt with impaired memory during session, forgetful of PT direction at times      Exercises Other Exercises Other Exercises: PT provides exercise handout and verbal education on ankle pumps, heel slides, SLRs, and glute sets    General Comments General comments (skin integrity, edema, etc.): VSS on RA, pt appears anxious at times with standing attempts and when placed flat to roll      Pertinent Vitals/Pain Pain Assessment: Faces Faces Pain Scale: Hurts little more Pain Location: RUQ and RLE Pain Descriptors / Indicators: Grimacing Pain Intervention(s): Monitored during session    Home Living                      Prior Function  PT Goals (current goals can now be found in the care plan section) Acute Rehab PT Goals Patient Stated Goal: to rest Progress towards PT goals: Progressing toward goals (slowly)    Frequency    Min 2X/week      PT Plan Current plan remains appropriate    Co-evaluation              AM-PAC PT "6 Clicks" Mobility   Outcome Measure  Help needed turning from your back to your side while in a flat bed without using bedrails?: A Lot Help needed moving from lying on your back to sitting on the side of a flat bed without using  bedrails?: A Lot Help needed moving to and from a bed to a chair (including a wheelchair)?: Total Help needed standing up from a chair using your arms (e.g., wheelchair or bedside chair)?: Total Help needed to walk in hospital room?: Total Help needed climbing 3-5 steps with a railing? : Total 6 Click Score: 8    End of Session   Activity Tolerance: Patient limited by fatigue Patient left: in chair;with call bell/phone within reach;with chair alarm set;with family/visitor present Nurse Communication: Mobility status PT Visit Diagnosis: Other abnormalities of gait and mobility (R26.89);Muscle weakness (generalized) (M62.81);Pain Pain - Right/Left: Right Pain - part of body: Leg     Time: 1610-9604 PT Time Calculation (min) (ACUTE ONLY): 33 min  Charges:  $Therapeutic Activity: 23-37 mins                     Arlyss Gandy, PT, DPT Acute Rehabilitation Pager: [phone]    Arlyss Gandy 09/08/2020, 2:22 PM

## 2020-09-08 NOTE — Discharge Summary (Signed)
Physician Discharge Summary  Katherine Cervantes BJY:[id number] DOB: 1930-01-09 DOA: 09/04/2020  PCP: Katherine Nielsen, DO  Admit date: 09/04/2020 Discharge date: 09/08/2020  Admitted From: ALF Disposition: SNF  Recommendations for Outpatient Follow-up:  Follow up with PCP in 1-2 weeks Please obtain BMP/CBC in one week Follow up on resolution of cellulitis.  Repeat  A1c in 3 months.  Follow carb modified diet.   Discharge Condition: Stable. CODE STATUS: DNR Diet recommendation: Heart Healthy / Carb Modified /   Brief/Interim Summary: 87 year old with past medical history significant for CAD, permanent A. fib, pacemaker secondary to sinus node dysfunction, presented to the ED with left hand cellulitis and chest pain. Neurology consulted for chest pain, no further recommendation, chest pain likely due to recent shingles outbreak.  Troponin unremarkable.    1-Left upper extremity cellulitis: Doppler negative for DVT X-ray show soft tissue swelling Plan to continue with vancomycin Redness and Edema has significantly improved Will plan to treat for 7 days. Will plan to discharge on clindamycin for 5 more  days.  Redness improving.   2-Chest pain: Atypical: Cardiology consulted no further cardiac work-up planned at 3-Sleep apnea: Patient not on CPAP Permanent A. fib: Continue with Xarelto and Toprol Status post pacemaker for sick sinus syndrome Hypertension: CAD: Continue with beta-blocker Chronic diastolic heart failure: She was transition  from IV Lasix to oral Lasix 7/27. Continue with the spironolactone  DM ; A1 6.5. Continue with Diet out patient.  Hyponatremia; Mild monitor.  Discharge to SNF when bed available.   Patient is medical stable for Rehab, awaiting SNF  Discharge Diagnoses:  Active Problems:   Essential hypertension   CAD (coronary artery disease)   Permanent atrial fibrillation (HCC)   Sleep apnea   Pacemaker   Type 2 diabetes mellitus (HCC)    Hyperlipidemia   Morbid obesity with BMI of 40.0-44.9, adult (HCC)   Chronic anticoagulation   Venous insufficiency of both lower extremities   Chest pain   Chronic diastolic CHF (congestive heart failure) (HCC)   Cellulitis of hand   Cellulitis    Discharge Instructions  Discharge Instructions     Diet - low sodium heart healthy   Complete by: As directed    Increase activity slowly   Complete by: As directed       Allergies as of 09/08/2020       Reactions   Aspirin Anaphylaxis   Other Anaphylaxis   Penicillins Anaphylaxis   Anaphylaxis   Codeine Other (See Comments)   SOB Asthmatic issues   Levofloxacin In D5w Other (See Comments)   Disoriented, Hallucinations Other reaction(s): Other Disoriented, Hallucinations   Morphine And Related Other (See Comments)   Anaphylaxis   Sulfa Antibiotics Rash   Ether    Metoprolol    Possible Per MAR   Prednisone Other (See Comments)   Lethargic, rash   Procaine         Medication List     TAKE these medications    acetaminophen 325 MG tablet Commonly known as: TYLENOL Take 2 tablets (650 mg total) by mouth every 4 (four) hours as needed for headache or mild pain. What changed:  when to take this reasons to take this   albuterol 108 (90 Base) MCG/ACT inhaler Commonly known as: VENTOLIN HFA Inhale 1-2 puffs into the lungs every 4 (four) hours as needed for wheezing or shortness of breath. What changed:  how much to take when to take this reasons to take this   clindamycin 150  MG capsule Commonly known as: Cleocin Take 2 capsules (300 mg total) by mouth 4 (four) times daily for 5 days.   fluconazole 150 MG tablet Commonly known as: DIFLUCAN Take 150 mg by mouth once a week.   furosemide 80 MG tablet Commonly known as: LASIX Take 1 tablet (80 mg total) by mouth 2 (two) times daily.   gabapentin 400 MG capsule Commonly known as: NEURONTIN Take 400 mg by mouth See admin instructions. 800mg  in the  morning 400mg  twice daily   Lidocaine 4 % Ptch Apply 1 patch topically daily. Apply to right chest topically one time a day for pain at 0800, remove at 2000   metoprolol succinate 50 MG 24 hr tablet Commonly known as: TOPROL-XL Take 1 tablet (50 mg total) by mouth daily.   nitroGLYCERIN 0.4 MG SL tablet Commonly known as: NITROSTAT Place 1 tablet (0.4 mg total) under the tongue every 5 (five) minutes as needed for chest pain (MAX 3 TABLETS).   nystatin powder Commonly known as: MYCOSTATIN/NYSTOP Apply 1 application topically 2 (two) times daily. Apply to breast folds   potassium chloride 10 MEQ tablet Commonly known as: KLOR-CON Take 10 mEq by mouth daily.   rivaroxaban 20 MG Tabs tablet Commonly known as: Xarelto Take 1 tablet (20 mg total) by mouth daily.   traMADol 50 MG tablet Commonly known as: ULTRAM Take 1 tablet (50 mg total) by mouth every 6 (six) hours as needed for severe pain.       ASK your doctor about these medications    spironolactone 25 MG tablet Commonly known as: ALDACTONE Take 1 tablet (25 mg total) by mouth daily.        Allergies  Allergen Reactions   Aspirin Anaphylaxis   Other Anaphylaxis   Penicillins Anaphylaxis    Anaphylaxis   Codeine Other (See Comments)    SOB Asthmatic issues   Levofloxacin In D5w Other (See Comments)    Disoriented, Hallucinations Other reaction(s): Other Disoriented, Hallucinations   Morphine And Related Other (See Comments)    Anaphylaxis   Sulfa Antibiotics Rash   Ether    Metoprolol     Possible  Per MAR   Prednisone Other (See Comments)    Lethargic, rash   Procaine     Consultations: Cardiology    Procedures/Studies: DG Chest 2 View  Result Date: 09/04/2020 CLINICAL DATA:  Chest pain. EXAM: CHEST - 2 VIEW COMPARISON:  May 23, 2020. FINDINGS: Stable cardiomegaly. Left-sided pacemaker is unchanged in position. No pneumothorax or pleural effusion is noted. Lungs are clear. Old right rib  fractures are noted. IMPRESSION: No active cardiopulmonary disease. Aortic Atherosclerosis (ICD10-I70.0). Electronically Signed   By: 09/06/2020 M.D.   On: 09/04/2020 14:50   Katherine Cervantes Venous Img Upper Uni Left  Result Date: 09/04/2020 CLINICAL DATA:  87 year old with left hand and arm swelling. EXAM: LEFT UPPER EXTREMITY VENOUS DOPPLER ULTRASOUND TECHNIQUE: Gray-scale sonography with graded compression, as well as color Doppler and duplex ultrasound were performed to evaluate the upper extremity deep venous system from the level of the subclavian vein and including the jugular, axillary, basilic, radial, ulnar and upper cephalic vein. Spectral Doppler was utilized to evaluate flow at rest and with distal augmentation maneuvers. COMPARISON:  None. FINDINGS: Contralateral Subclavian Vein: No evidence of thrombus. Normal color Doppler flow. Internal Jugular Vein: No evidence of thrombus. Normal compressibility, color flow and phasicity. Subclavian Vein: No evidence of thrombus. Normal compressibility, color Doppler flow and phasicity. Axillary Vein: No evidence  of thrombus. Normal compressibility, color Doppler flow and augmentation. Cephalic Vein: No evidence of thrombus. Normal compressibility and color Doppler flow. Basilic Vein: No evidence of thrombus. Normal compressibility and color Doppler flow. Brachial Veins: No evidence of thrombus. Normal compressibility and color Doppler flow. Radial Veins: No evidence of thrombus.  Normal compressibility. Ulnar Veins: No evidence of thrombus.  Normal compressibility. Other Findings:  None visualized. IMPRESSION: No evidence of DVT within the left upper extremity. Electronically Signed   By: Richarda Overlie M.D.   On: 09/04/2020 15:04   DG Hand Complete Left  Result Date: 09/04/2020 CLINICAL DATA:  Left hand pain and swelling. EXAM: LEFT HAND - COMPLETE 3+ VIEW COMPARISON:  May 09, 2009. FINDINGS: No acute fracture or dislocation is noted. Resorption of the proximal  portion of the scaphoid bone is noted most consistent with avascular necrosis from old injury. Mild degenerative changes seen involving the first carpometacarpal joint. Soft tissue swelling is seen involving the second and third fingers. Moderate degenerative changes seen involving the third metacarpophalangeal joint. IMPRESSION: Degenerative joint disease is seen involving the first carpometacarpal joint as well as the third metacarpophalangeal joint. Soft tissue swelling is seen involving the second and third fingers. 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 No acute fracture or dislocation is noted. Electronically Signed   By: Katherine Cervantes M.D.   On: 09/04/2020 14:54   CUP PACEART INCLINIC DEVICE CHECK  Result Date: 08/10/2020 Pacemaker check in clinic. Normal device function. Thresholds, sensing, impedances consistent with previous measurements. Device programmed to maximize longevity no high ventricular rates noted. Device programmed at appropriate safety margins. Histogram distribution appropriate for patient activity level. Device programmed to optimize intrinsic conduction. Estimated longevity  10 years__. Patient enrolled in remote follow-up. Patient education completed. Pacemaker check in clinic. Normal device function. Thresholds, sensing, impedances consistent with previous measurements. Device programmed to maximize longevity no high ventricular rates noted. Device programmed at appropriate safety margins. Histogram distribution appropriate for patient activity level. Device programmed to optimize intrinsic conduction. Estimated longevity  10 years__. Patient enrolled in remote follow-up. Patient education completed.  CUP PACEART REMOTE DEVICE CHECK  Result Date: 08/19/2020 Scheduled remote reviewed. Normal device function.  Ventricular autocapture frequently out of range Next remote 91 days.    Subjective: No new complain edema and redness improved.   Discharge  Exam: Vitals:   09/08/20 0739 09/08/20 1153  BP: (!) 147/63 93/80  Pulse: 60 (!) 59  Resp: 17 17  Temp: 97.7 F (36.5 C) 97.8 F (36.6 C)  SpO2: 95% 96%     General: Pt is alert, awake, not in acute distress Cardiovascular: RRR, S1/S2 +, no rubs, no gallops Respiratory: CTA bilaterally, no wheezing, no rhonchi Abdominal: Soft, NT, ND, bowel sounds + Extremities: no edema, no cyanosis, left hand redness improved    The results of significant diagnostics from this hospitalization (including imaging, microbiology, ancillary and laboratory) are listed below for reference.     Microbiology: Recent Results (from the past 240 hour(s))  Resp Panel by RT-PCR (Flu A&B, Covid) Nasopharyngeal Swab     Status: None   Collection Time: 09/04/20  3:19 PM   Specimen: Nasopharyngeal Swab; Nasopharyngeal(NP) swabs in vial transport medium  Result Value Ref Range Status   SARS Coronavirus 2 by RT PCR NEGATIVE NEGATIVE Final    Comment: (NOTE) SARS-CoV-2 target nucleic acids are NOT DETECTED.  The SARS-CoV-2 RNA is generally detectable in upper respiratory specimens during the acute phase of infection.  The lowest concentration of SARS-CoV-2 viral copies this assay can detect is 138 copies/mL. A negative result does not preclude SARS-Cov-2 infection and should not be used as the sole basis for treatment or other patient management decisions. A negative result may occur with  improper specimen collection/handling, submission of specimen other than nasopharyngeal swab, presence of viral mutation(s) within the areas targeted by this assay, and inadequate number of viral copies(<138 copies/mL). A negative result must be combined with clinical observations, patient history, and epidemiological information. The expected result is Negative.  Fact Sheet for Patients:  BloggerCourse.com  Fact Sheet for Healthcare Providers:   SeriousBroker.it  This test is no t yet approved or cleared by the [place] FDA and  has been authorized for detection and/or diagnosis of SARS-CoV-2 by FDA under an Emergency Use Authorization (EUA). This EUA will remain  in effect (meaning this test can be used) for the duration of the COVID-19 declaration under Section 564(b)(1) of the Act, 21 U.S.C.section 360bbb-3(b)(1), unless the authorization is terminated  or revoked sooner.       Influenza A by PCR NEGATIVE NEGATIVE Final   Influenza B by PCR NEGATIVE NEGATIVE Final    Comment: (NOTE) The Xpert Xpress SARS-CoV-2/FLU/RSV plus assay is intended as an aid in the diagnosis of influenza from Nasopharyngeal swab specimens and should not be used as a sole basis for treatment. Nasal washings and aspirates are unacceptable for Xpert Xpress SARS-CoV-2/FLU/RSV testing.  Fact Sheet for Patients: BloggerCourse.com  Fact Sheet for Healthcare Providers: SeriousBroker.it  This test is not yet approved or cleared by the [place] FDA and has been authorized for detection and/or diagnosis of SARS-CoV-2 by FDA under an Emergency Use Authorization (EUA). This EUA will remain in effect (meaning this test can be used) for the duration of the COVID-19 declaration under Section 564(b)(1) of the Act, 21 U.S.C. section 360bbb-3(b)(1), unless the authorization is terminated or revoked.  Performed at [facility], [address] [place], [place] [address]   MRSA Next Gen by PCR, Nasal     Status: None   Collection Time: 09/05/20  4:35 AM   Specimen: Nasal Mucosa; Nasal Swab  Result Value Ref Range Status   MRSA by PCR Next Gen NOT DETECTED NOT DETECTED Final    Comment: (NOTE) The GeneXpert MRSA Assay (FDA approved for NASAL specimens only), is one component of a comprehensive MRSA colonization surveillance program. It is not intended to  diagnose MRSA infection nor to guide or monitor treatment for MRSA infections. Test performance is not FDA approved in patients less than 41 years old. Performed at [facility], [address], [place], [place] [address]   SARS CORONAVIRUS 2 (TAT 6-24 HRS) Nasopharyngeal Nasopharyngeal Swab     Status: None   Collection Time: 09/07/20  1:39 AM   Specimen: Nasopharyngeal Swab  Result Value Ref Range Status   SARS Coronavirus 2 NEGATIVE NEGATIVE Final    Comment: (NOTE) SARS-CoV-2 target nucleic acids are NOT DETECTED.  The SARS-CoV-2 RNA is generally detectable in upper and lower respiratory specimens during the acute phase of infection. Negative results do not preclude SARS-CoV-2 infection, do not rule out co-infections with other pathogens, and should not be used as the sole basis for treatment or other patient management decisions. Negative results must be combined with clinical observations, patient history, and epidemiological information. The expected result is Negative.  Fact Sheet for Patients: HairSlick.no  Fact Sheet for Healthcare Providers: quierodirigir.com  This  test is not yet approved or cleared by the [place] and  has been authorized for detection and/or diagnosis of SARS-CoV-2 by FDA under an Emergency Use Authorization (EUA). This EUA will remain  in effect (meaning this test can be used) for the duration of the COVID-19 declaration under Se ction 564(b)(1) of the Act, 21 U.S.C. section 360bbb-3(b)(1), unless the authorization is terminated or revoked sooner.  Performed at [facility], [address] [place], [place] [address]      Labs: BNP (last 3 results) Recent Labs    12/31/19 1340 02/23/20 1202 09/04/20 1600  BNP 257* 215* 386.8*    Basic Metabolic Panel: Recent Labs  Lab 09/04/20 1600 09/05/20 0315 09/06/20 0218 09/07/20 0258 09/08/20 0151  NA 136 137 138  132* 134*  K 3.2* 3.0* 3.7 3.5 3.3*  CL 95* 98 99 95* 97*  CO2 31 29 32 27 28  GLUCOSE 127* 122* 101* 119* 129*  BUN 25* 21 22 25* 32*  CREATININE 0.89 0.78 0.91 0.97 0.94  CALCIUM 8.7* 8.8* 9.0 9.0 8.8*  MG  --  1.9  --   --   --   PHOS  --  2.9  --   --   --     Liver Function Tests: Recent Labs  Lab 09/04/20 1600 09/05/20 0315  AST 16 15  ALT 10 11  ALKPHOS 47 39  BILITOT 0.9 1.5*  PROT 7.8 7.2  ALBUMIN 3.1* 2.8*    Recent Labs  Lab 09/04/20 1600  LIPASE 31    No results for input(s): AMMONIA in the last 168 hours. CBC: Recent Labs  Lab 09/04/20 1600 09/05/20 0315 09/06/20 0218 09/07/20 0258 09/08/20 0151  WBC 10.1 8.3 6.8 9.6 6.7  NEUTROABS 7.3 6.0 4.2 7.0 4.2  HGB 12.4 12.9 12.7 12.2 11.7*  HCT 37.6 38.7 38.6 37.8 36.3  MCV 89.3 89.8 90.4 89.8 89.6  PLT 232 230 245 270 276    Cardiac Enzymes: No results for input(s): CKTOTAL, CKMB, CKMBINDEX, TROPONINI in the last 168 hours. BNP: Invalid input(s): POCBNP CBG: Recent Labs  Lab 09/07/20 1953 09/08/20 0017 09/08/20 0424 09/08/20 0742 09/08/20 1150  GLUCAP 129* 107* 156* 111* 128*    D-Dimer No results for input(s): DDIMER in the last 72 hours. Hgb A1c No results for input(s): HGBA1C in the last 72 hours.  Lipid Profile No results for input(s): CHOL, HDL, LDLCALC, TRIG, CHOLHDL, LDLDIRECT in the last 72 hours. Thyroid function studies No results for input(s): TSH, T4TOTAL, T3FREE, THYROIDAB in the last 72 hours.  Invalid input(s): FREET3  Anemia work up No results for input(s): VITAMINB12, FOLATE, FERRITIN, TIBC, IRON, RETICCTPCT in the last 72 hours. Urinalysis    Component Value Date/Time   COLORURINE YELLOW 05/23/2020 2000   APPEARANCEUR CLEAR 05/23/2020 2000   LABSPEC 1.010 05/23/2020 2000   PHURINE 7.0 05/23/2020 2000   GLUCOSEU NEGATIVE 05/23/2020 2000   HGBUR NEGATIVE 05/23/2020 2000   BILIRUBINUR NEGATIVE 05/23/2020 2000   BILIRUBINUR negative 10/28/2017 0939    BILIRUBINUR NEGATIVE 07/04/2016 0845   KETONESUR NEGATIVE 05/23/2020 2000   PROTEINUR NEGATIVE 05/23/2020 2000   UROBILINOGEN 1.0 10/28/2017 0939   NITRITE NEGATIVE 05/23/2020 2000   LEUKOCYTESUR NEGATIVE 05/23/2020 2000   Sepsis Labs Invalid input(s): PROCALCITONIN,  WBC,  LACTICIDVEN Microbiology Recent Results (from the past 240 hour(s))  Resp Panel by RT-PCR (Flu A&B, Covid) Nasopharyngeal Swab     Status: None   Collection Time: 09/04/20  3:19 PM  Specimen: Nasopharyngeal Swab; Nasopharyngeal(NP) swabs in vial transport medium  Result Value Ref Range Status   SARS Coronavirus 2 by RT PCR NEGATIVE NEGATIVE Final    Comment: (NOTE) SARS-CoV-2 target nucleic acids are NOT DETECTED.  The SARS-CoV-2 RNA is generally detectable in upper respiratory specimens during the acute phase of infection. The lowest concentration of SARS-CoV-2 viral copies this assay can detect is 138 copies/mL. A negative result does not preclude SARS-Cov-2 infection and should not be used as the sole basis for treatment or other patient management decisions. A negative result may occur with  improper specimen collection/handling, submission of specimen other than nasopharyngeal swab, presence of viral mutation(s) within the areas targeted by this assay, and inadequate number of viral copies(<138 copies/mL). A negative result must be combined with clinical observations, patient history, and epidemiological information. The expected result is Negative.  Fact Sheet for Patients:  BloggerCourse.com  Fact Sheet for Healthcare Providers:  SeriousBroker.it  This test is no t yet approved or cleared by the [place] FDA and  has been authorized for detection and/or diagnosis of SARS-CoV-2 by FDA under an Emergency Use Authorization (EUA). This EUA will remain  in effect (meaning this test can be used) for the duration of the COVID-19 declaration under  Section 564(b)(1) of the Act, 21 U.S.C.section 360bbb-3(b)(1), unless the authorization is terminated  or revoked sooner.       Influenza A by PCR NEGATIVE NEGATIVE Final   Influenza B by PCR NEGATIVE NEGATIVE Final    Comment: (NOTE) The Xpert Xpress SARS-CoV-2/FLU/RSV plus assay is intended as an aid in the diagnosis of influenza from Nasopharyngeal swab specimens and should not be used as a sole basis for treatment. Nasal washings and aspirates are unacceptable for Xpert Xpress SARS-CoV-2/FLU/RSV testing.  Fact Sheet for Patients: BloggerCourse.com  Fact Sheet for Healthcare Providers: SeriousBroker.it  This test is not yet approved or cleared by the [place] FDA and has been authorized for detection and/or diagnosis of SARS-CoV-2 by FDA under an Emergency Use Authorization (EUA). This EUA will remain in effect (meaning this test can be used) for the duration of the COVID-19 declaration under Section 564(b)(1) of the Act, 21 U.S.C. section 360bbb-3(b)(1), unless the authorization is terminated or revoked.  Performed at [facility], [address] [place], [place] [address]   MRSA Next Gen by PCR, Nasal     Status: None   Collection Time: 09/05/20  4:35 AM   Specimen: Nasal Mucosa; Nasal Swab  Result Value Ref Range Status   MRSA by PCR Next Gen NOT DETECTED NOT DETECTED Final    Comment: (NOTE) The GeneXpert MRSA Assay (FDA approved for NASAL specimens only), is one component of a comprehensive MRSA colonization surveillance program. It is not intended to diagnose MRSA infection nor to guide or monitor treatment for MRSA infections. Test performance is not FDA approved in patients less than 24 years old. Performed at [facility], [address], [place], [place] [address]   SARS CORONAVIRUS 2 (TAT 6-24 HRS) Nasopharyngeal Nasopharyngeal Swab     Status: None   Collection Time: 09/07/20  1:39 AM    Specimen: Nasopharyngeal Swab  Result Value Ref Range Status   SARS Coronavirus 2 NEGATIVE NEGATIVE Final    Comment: (NOTE) SARS-CoV-2 target nucleic acids are NOT DETECTED.  The SARS-CoV-2 RNA is generally detectable in upper and lower respiratory specimens during the acute phase of infection. Negative results do not preclude SARS-CoV-2 infection, do not  rule out co-infections with other pathogens, and should not be used as the sole basis for treatment or other patient management decisions. Negative results must be combined with clinical observations, patient history, and epidemiological information. The expected result is Negative.  Fact Sheet for Patients: HairSlick.no  Fact Sheet for Healthcare Providers: quierodirigir.com  This test is not yet approved or cleared by the [place] FDA and  has been authorized for detection and/or diagnosis of SARS-CoV-2 by FDA under an Emergency Use Authorization (EUA). This EUA will remain  in effect (meaning this test can be used) for the duration of the COVID-19 declaration under Se ction 564(b)(1) of the Act, 21 U.S.C. section 360bbb-3(b)(1), unless the authorization is terminated or revoked sooner.  Performed at [facility], [address] [place], [place] [address]      Time coordinating discharge: 40 minutes  SIGNED:   Alba Cory, MD  [facility]

## 2020-09-08 NOTE — Progress Notes (Signed)
Remote pacemaker transmission.   

## 2020-09-08 NOTE — TOC Progression Note (Addendum)
Transition of Care [facility]) - Progression Note    Patient Details  Name: Katherine Cervantes MRN: [id number] Date of Birth: January 20, 1930  [facility] CM/SW Contact  Ivette Loyal, [place] Phone Number: 09/08/2020, 3:15 PM  Clinical Narrative:    CSW spoke to pt daughter who states she did initially want [facility] but if they are not available she would like any facility near [place] or [address] CSW let pt daughter know that [facility] could accept pt but would not have abed until Monday. CSW contacted [address], [place], [place], and [place] for bed availability. There were no one to answer form any of these facilities but CSW left VM for facilities to follow up with CSW. Pt still pending SNF availability.  Pt has bed offer at [facility], admission is gone for the day, CSW left a message with Marchelle Folks at [phone]. CSW will follow up tomorrow for further DC instructions. CSW contacted [name] pt daughter to update her on pt bed offer. [name] is agreeable and wants CSW to follow up on DC tomorrow.  Expected Discharge Plan: Skilled Nursing Facility Barriers to Discharge: Continued Medical Work up  Expected Discharge Plan and Services Expected Discharge Plan: Skilled Nursing Facility In-house Referral: Clinical Social Work Discharge Planning Services: NA Post Acute Care Choice: Skilled Nursing Facility Living arrangements for the past 2 months: Assisted Living Facility Expected Discharge Date: 09/07/20                                     Social Determinants of Health (SDOH) Interventions    Readmission Risk Interventions No flowsheet data found.

## 2020-09-09 DIAGNOSIS — I10 Essential (primary) hypertension: Secondary | ICD-10-CM | POA: Diagnosis not present

## 2020-09-09 DIAGNOSIS — Z7901 Long term (current) use of anticoagulants: Secondary | ICD-10-CM | POA: Diagnosis not present

## 2020-09-09 DIAGNOSIS — Z79899 Other long term (current) drug therapy: Secondary | ICD-10-CM | POA: Diagnosis not present

## 2020-09-09 DIAGNOSIS — M79642 Pain in left hand: Secondary | ICD-10-CM | POA: Diagnosis not present

## 2020-09-09 DIAGNOSIS — G629 Polyneuropathy, unspecified: Secondary | ICD-10-CM | POA: Diagnosis not present

## 2020-09-09 DIAGNOSIS — Z7401 Bed confinement status: Secondary | ICD-10-CM | POA: Diagnosis not present

## 2020-09-09 DIAGNOSIS — N390 Urinary tract infection, site not specified: Secondary | ICD-10-CM | POA: Diagnosis not present

## 2020-09-09 DIAGNOSIS — L03818 Cellulitis of other sites: Secondary | ICD-10-CM | POA: Diagnosis not present

## 2020-09-09 DIAGNOSIS — E785 Hyperlipidemia, unspecified: Secondary | ICD-10-CM | POA: Diagnosis not present

## 2020-09-09 DIAGNOSIS — I2510 Atherosclerotic heart disease of native coronary artery without angina pectoris: Secondary | ICD-10-CM | POA: Diagnosis not present

## 2020-09-09 DIAGNOSIS — L03114 Cellulitis of left upper limb: Secondary | ICD-10-CM | POA: Diagnosis not present

## 2020-09-09 DIAGNOSIS — R6889 Other general symptoms and signs: Secondary | ICD-10-CM | POA: Diagnosis not present

## 2020-09-09 DIAGNOSIS — M6281 Muscle weakness (generalized): Secondary | ICD-10-CM | POA: Diagnosis not present

## 2020-09-09 DIAGNOSIS — I4821 Permanent atrial fibrillation: Secondary | ICD-10-CM | POA: Diagnosis not present

## 2020-09-09 DIAGNOSIS — R899 Unspecified abnormal finding in specimens from other organs, systems and tissues: Secondary | ICD-10-CM | POA: Diagnosis not present

## 2020-09-09 DIAGNOSIS — E119 Type 2 diabetes mellitus without complications: Secondary | ICD-10-CM | POA: Diagnosis not present

## 2020-09-09 DIAGNOSIS — I5032 Chronic diastolic (congestive) heart failure: Secondary | ICD-10-CM | POA: Diagnosis not present

## 2020-09-09 DIAGNOSIS — I872 Venous insufficiency (chronic) (peripheral): Secondary | ICD-10-CM | POA: Diagnosis not present

## 2020-09-09 DIAGNOSIS — E1022 Type 1 diabetes mellitus with diabetic chronic kidney disease: Secondary | ICD-10-CM | POA: Diagnosis not present

## 2020-09-09 DIAGNOSIS — R5383 Other fatigue: Secondary | ICD-10-CM | POA: Diagnosis not present

## 2020-09-09 DIAGNOSIS — G4730 Sleep apnea, unspecified: Secondary | ICD-10-CM | POA: Diagnosis not present

## 2020-09-09 DIAGNOSIS — Z743 Need for continuous supervision: Secondary | ICD-10-CM | POA: Diagnosis not present

## 2020-09-09 DIAGNOSIS — D519 Vitamin B12 deficiency anemia, unspecified: Secondary | ICD-10-CM | POA: Diagnosis not present

## 2020-09-09 DIAGNOSIS — N183 Chronic kidney disease, stage 3 unspecified: Secondary | ICD-10-CM | POA: Diagnosis not present

## 2020-09-09 DIAGNOSIS — R1312 Dysphagia, oropharyngeal phase: Secondary | ICD-10-CM | POA: Diagnosis not present

## 2020-09-09 DIAGNOSIS — E559 Vitamin D deficiency, unspecified: Secondary | ICD-10-CM | POA: Diagnosis not present

## 2020-09-09 DIAGNOSIS — R2689 Other abnormalities of gait and mobility: Secondary | ICD-10-CM | POA: Diagnosis not present

## 2020-09-09 LAB — CBC WITH DIFFERENTIAL/PLATELET
Abs Immature Granulocytes: 0.03 10*3/uL (ref 0.00–0.07)
Basophils Absolute: 0 10*3/uL (ref 0.0–0.1)
Basophils Relative: 1 %
Eosinophils Absolute: 0.2 10*3/uL (ref 0.0–0.5)
Eosinophils Relative: 3 %
HCT: 38.2 % (ref 36.0–46.0)
Hemoglobin: 12.3 g/dL (ref 12.0–15.0)
Immature Granulocytes: 1 %
Lymphocytes Relative: 22 %
Lymphs Abs: 1.4 10*3/uL (ref 0.7–4.0)
MCH: 29 pg (ref 26.0–34.0)
MCHC: 32.2 g/dL (ref 30.0–36.0)
MCV: 90.1 fL (ref 80.0–100.0)
Monocytes Absolute: 0.8 10*3/uL (ref 0.1–1.0)
Monocytes Relative: 13 %
Neutro Abs: 3.9 10*3/uL (ref 1.7–7.7)
Neutrophils Relative %: 60 %
Platelets: 297 10*3/uL (ref 150–400)
RBC: 4.24 MIL/uL (ref 3.87–5.11)
RDW: 15.4 % (ref 11.5–15.5)
WBC: 6.4 10*3/uL (ref 4.0–10.5)
nRBC: 0 % (ref 0.0–0.2)

## 2020-09-09 LAB — GLUCOSE, CAPILLARY
Glucose-Capillary: 110 mg/dL — ABNORMAL HIGH (ref 70–99)
Glucose-Capillary: 121 mg/dL — ABNORMAL HIGH (ref 70–99)
Glucose-Capillary: 120 mg/dL — ABNORMAL HIGH (ref 70–99)
Glucose-Capillary: 195 mg/dL — ABNORMAL HIGH (ref 70–99)

## 2020-09-09 LAB — BASIC METABOLIC PANEL
Anion gap: 10 (ref 5–15)
BUN: 30 mg/dL — ABNORMAL HIGH (ref 8–23)
CO2: 24 mmol/L (ref 22–32)
Calcium: 8.8 mg/dL — ABNORMAL LOW (ref 8.9–10.3)
Chloride: 98 mmol/L (ref 98–111)
Creatinine, Ser: 0.83 mg/dL (ref 0.44–1.00)
GFR, Estimated: >60 mL/min (ref >60)
Glucose, Bld: 98 mg/dL (ref 70–99)
Potassium: 3.9 mmol/L (ref 3.5–5.1)
Sodium: 132 mmol/L — ABNORMAL LOW (ref 135–145)

## 2020-09-09 MED ORDER — GABAPENTIN 400 MG PO CAPS: 400.0000 mg | ORAL_CAPSULE | Freq: Two times a day (BID) | ORAL | Status: DC

## 2020-09-09 MED ORDER — GABAPENTIN 400 MG PO CAPS: 400.0000 mg | ORAL_CAPSULE | Freq: Two times a day (BID) | ORAL | 0 refills | Status: AC

## 2020-09-09 MED ORDER — CLINDAMYCIN HCL 150 MG PO CAPS: 300.0000 mg | ORAL_CAPSULE | Freq: Four times a day (QID) | ORAL | 0 refills | Status: AC

## 2020-09-09 NOTE — Discharge Summary (Addendum)
Physician Discharge Summary  JACKILYN UMPHLETT RKY:[id number] DOB: 1929-08-30 DOA: 09/04/2020  PCP: Sunnie Nielsen, DO  Admit date: 09/04/2020 Discharge date: 09/09/2020  Admitted From: ALF Disposition: SNF  Recommendations for Outpatient Follow-up:  Follow up with PCP in 1-2 weeks Please obtain BMP/CBC in one week Follow up on resolution of cellulitis.  Repeat  A1c in 3 months.  Follow carb modified diet.   Discharge Condition: Stable. CODE STATUS: DNR Diet recommendation: Heart Healthy / Carb Modified /   Brief/Interim Summary: 87 year old with past medical history significant for CAD, permanent A. fib, pacemaker secondary to sinus node dysfunction, presented to the ED with left hand cellulitis and chest pain. Neurology consulted for chest pain, no further recommendation, chest pain likely due to recent shingles outbreak.  Troponin unremarkable.    1-Left upper extremity cellulitis: Doppler negative for DVT X-ray show soft tissue swelling Plan to continue with vancomycin Redness and Edema has significantly improved Day 5 vancomycin. Will plan to discharge on clindamycin for 3 more  days.  Redness improving.  Keep arm elevated.   2-Chest pain: Atypical: Cardiology consulted no further cardiac work-up planned at 3-Sleep apnea: Patient not on CPAP Permanent A. fib: Continue with Xarelto and Toprol Status post pacemaker for sick sinus syndrome Hypertension; lasix and spironolactone CAD: Continue with beta-blocker Chronic diastolic heart failure: She was transition  from IV Lasix to oral Lasix 7/27. Continue with the spironolactone  DM ; A1 6.5. Continue with Diet out patient.  Hyponatremia; Mild monitor.  Discharge to SNF when bed available.   Patient is medical stable for Rehab, awaiting SNF  Discharge Diagnoses:  Active Problems:   Essential hypertension   CAD (coronary artery disease)   Permanent atrial fibrillation (HCC)   Sleep apnea   Pacemaker   Type 2  diabetes mellitus (HCC)   Hyperlipidemia   Morbid obesity with BMI of 40.0-44.9, adult (HCC)   Chronic anticoagulation   Venous insufficiency of both lower extremities   Chest pain   Chronic diastolic CHF (congestive heart failure) (HCC)   Cellulitis of hand   Cellulitis   Left hand pain    Discharge Instructions  Discharge Instructions     Diet - low sodium heart healthy   Complete by: As directed    Diet - low sodium heart healthy   Complete by: As directed    Increase activity slowly   Complete by: As directed    Increase activity slowly   Complete by: As directed       Allergies as of 09/09/2020       Reactions   Aspirin Anaphylaxis   Other Anaphylaxis   Penicillins Anaphylaxis   Anaphylaxis   Codeine Other (See Comments)   SOB Asthmatic issues   Levofloxacin In D5w Other (See Comments)   Disoriented, Hallucinations Other reaction(s): Other Disoriented, Hallucinations   Morphine And Related Other (See Comments)   Anaphylaxis   Sulfa Antibiotics Rash   Ether    Metoprolol    Possible Per MAR   Prednisone Other (See Comments)   Lethargic, rash   Procaine         Medication List     TAKE these medications    acetaminophen 325 MG tablet Commonly known as: TYLENOL Take 2 tablets (650 mg total) by mouth every 4 (four) hours as needed for headache or mild pain. What changed:  when to take this reasons to take this   albuterol 108 (90 Base) MCG/ACT inhaler Commonly known as: VENTOLIN HFA Inhale 1-2  puffs into the lungs every 4 (four) hours as needed for wheezing or shortness of breath. What changed:  how much to take when to take this reasons to take this   clindamycin 150 MG capsule Commonly known as: Cleocin Take 2 capsules (300 mg total) by mouth 4 (four) times daily for 3 days.   fluconazole 150 MG tablet Commonly known as: DIFLUCAN Take 150 mg by mouth once a week.   furosemide 80 MG tablet Commonly known as: LASIX Take 1 tablet (80  mg total) by mouth 2 (two) times daily.   gabapentin 400 MG capsule Commonly known as: NEURONTIN Take 1 capsule (400 mg total) by mouth 2 (two) times daily. What changed:  when to take this additional instructions   Lidocaine 4 % Ptch Apply 1 patch topically daily. Apply to right chest topically one time a day for pain at 0800, remove at 2000   metoprolol succinate 50 MG 24 hr tablet Commonly known as: TOPROL-XL Take 1 tablet (50 mg total) by mouth daily.   nitroGLYCERIN 0.4 MG SL tablet Commonly known as: NITROSTAT Place 1 tablet (0.4 mg total) under the tongue every 5 (five) minutes as needed for chest pain (MAX 3 TABLETS).   nystatin powder Commonly known as: MYCOSTATIN/NYSTOP Apply 1 application topically 2 (two) times daily. Apply to breast folds   potassium chloride 10 MEQ tablet Commonly known as: KLOR-CON Take 10 mEq by mouth daily.   rivaroxaban 20 MG Tabs tablet Commonly known as: Xarelto Take 1 tablet (20 mg total) by mouth daily.   traMADol 50 MG tablet Commonly known as: ULTRAM Take 1 tablet (50 mg total) by mouth every 6 (six) hours as needed for severe pain.       ASK your doctor about these medications    spironolactone 25 MG tablet Commonly known as: ALDACTONE Take 1 tablet (25 mg total) by mouth daily.        Allergies  Allergen Reactions   Aspirin Anaphylaxis   Other Anaphylaxis   Penicillins Anaphylaxis    Anaphylaxis   Codeine Other (See Comments)    SOB Asthmatic issues   Levofloxacin In D5w Other (See Comments)    Disoriented, Hallucinations Other reaction(s): Other Disoriented, Hallucinations   Morphine And Related Other (See Comments)    Anaphylaxis   Sulfa Antibiotics Rash   Ether    Metoprolol     Possible  Per MAR   Prednisone Other (See Comments)    Lethargic, rash   Procaine     Consultations: Cardiology    Procedures/Studies: DG Chest 2 View  Result Date: 09/04/2020 CLINICAL DATA:  Chest pain. EXAM: CHEST  - 2 VIEW COMPARISON:  May 23, 2020. FINDINGS: Stable cardiomegaly. Left-sided pacemaker is unchanged in position. No pneumothorax or pleural effusion is noted. Lungs are clear. Old right rib fractures are noted. IMPRESSION: No active cardiopulmonary disease. Aortic Atherosclerosis (ICD10-I70.0). Electronically Signed   By: [name] M.D.   On: 09/04/2020 14:50   US Venous Img Upper Uni Left  Result Date: 09/04/2020 CLINICAL DATA:  87 year old with left hand and arm swelling. EXAM: LEFT UPPER EXTREMITY VENOUS DOPPLER ULTRASOUND TECHNIQUE: Gray-scale sonography with graded compression, as well as color Doppler and duplex ultrasound were performed to evaluate the upper extremity deep venous system from the level of the subclavian vein and including the jugular, axillary, basilic, radial, ulnar and upper cephalic vein. Spectral Doppler was utilized to evaluate flow at rest and with distal augmentation maneuvers. COMPARISON:  None. FINDINGS:  Contralateral Subclavian Vein: No evidence of thrombus. Normal color Doppler flow. Internal Jugular Vein: No evidence of thrombus. Normal compressibility, color flow and phasicity. Subclavian Vein: No evidence of thrombus. Normal compressibility, color Doppler flow and phasicity. Axillary Vein: No evidence of thrombus. Normal compressibility, color Doppler flow and augmentation. Cephalic Vein: No evidence of thrombus. Normal compressibility and color Doppler flow. Basilic Vein: No evidence of thrombus. Normal compressibility and color Doppler flow. Brachial Veins: No evidence of thrombus. Normal compressibility and color Doppler flow. Radial Veins: No evidence of thrombus.  Normal compressibility. Ulnar Veins: No evidence of thrombus.  Normal compressibility. Other Findings:  None visualized. IMPRESSION: No evidence of DVT within the left upper extremity. Electronically Signed   By: Richarda Overlie M.D.   On: 09/04/2020 15:04   DG Hand Complete Left  Result Date:  09/04/2020 CLINICAL DATA:  Left hand pain and swelling. EXAM: LEFT HAND - COMPLETE 3+ VIEW COMPARISON:  May 09, 2009. FINDINGS: No acute fracture or dislocation is noted. Resorption of the proximal portion of the scaphoid bone is noted most consistent with avascular necrosis from old injury. Mild degenerative changes seen involving the first carpometacarpal joint. Soft tissue swelling is seen involving the second and third fingers. Moderate degenerative changes seen involving the third metacarpophalangeal joint. IMPRESSION: Degenerative joint disease is seen involving the first carpometacarpal joint as well as the third metacarpophalangeal joint. Soft tissue swelling is seen involving the second and third fingers. 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 No acute fracture or dislocation is noted. Electronically Signed   By: [name] M.D.   On: 09/04/2020 14:54   CUP PACEART REMOTE DEVICE CHECK  Result Date: 08/19/2020 Scheduled remote reviewed. Normal device function.  Ventricular autocapture frequently out of range Next remote 91 days.    Subjective: Redness improved.   Discharge Exam: Vitals:   09/09/20 0723 09/09/20 1134  BP: (!) 124/55 (!) 133/59  Pulse: (!) 59 60  Resp: 17 17  Temp: 98 F (36.7 C) 98.3 F (36.8 C)  SpO2: 96% 99%     General: Pt is alert, awake, not in acute distress Cardiovascular: RRR, S1/S2 +, no rubs, no gallops Respiratory: CTA bilaterally, no wheezing, no rhonchi Abdominal: Soft, NT, ND, bowel sounds + Extremities: no edema, no cyanosis, left hand redness improved    The results of significant diagnostics from this hospitalization (including imaging, microbiology, ancillary and laboratory) are listed below for reference.     Microbiology: Recent Results (from the past 240 hour(s))  Resp Panel by RT-PCR (Flu A&B, Covid) Nasopharyngeal Swab     Status: None   Collection Time: 09/04/20  3:19 PM   Specimen: Nasopharyngeal  Swab; Nasopharyngeal(NP) swabs in vial transport medium  Result Value Ref Range Status   SARS Coronavirus 2 by RT PCR NEGATIVE NEGATIVE Final    Comment: (NOTE) SARS-CoV-2 target nucleic acids are NOT DETECTED.  The SARS-CoV-2 RNA is generally detectable in upper respiratory specimens during the acute phase of infection. The lowest concentration of SARS-CoV-2 viral copies this assay can detect is 138 copies/mL. A negative result does not preclude SARS-Cov-2 infection and should not be used as the sole basis for treatment or other patient management decisions. A negative result may occur with  improper specimen collection/handling, submission of specimen other than nasopharyngeal swab, presence of viral mutation(s) within the areas targeted by this assay, and inadequate number of viral copies(<138 copies/mL). A negative result must be combined with clinical observations, patient history, and  epidemiological information. The expected result is Negative.  Fact Sheet for Patients:  BloggerCourse.com  Fact Sheet for Healthcare Providers:  SeriousBroker.it  This test is no t yet approved or cleared by the [place] FDA and  has been authorized for detection and/or diagnosis of SARS-CoV-2 by FDA under an Emergency Use Authorization (EUA). This EUA will remain  in effect (meaning this test can be used) for the duration of the COVID-19 declaration under Section 564(b)(1) of the Act, 21 U.S.C.section 360bbb-3(b)(1), unless the authorization is terminated  or revoked sooner.       Influenza A by PCR NEGATIVE NEGATIVE Final   Influenza B by PCR NEGATIVE NEGATIVE Final    Comment: (NOTE) The Xpert Xpress SARS-CoV-2/FLU/RSV plus assay is intended as an aid in the diagnosis of influenza from Nasopharyngeal swab specimens and should not be used as a sole basis for treatment. Nasal washings and aspirates are unacceptable for Xpert Xpress  SARS-CoV-2/FLU/RSV testing.  Fact Sheet for Patients: BloggerCourse.com  Fact Sheet for Healthcare Providers: SeriousBroker.it  This test is not yet approved or cleared by the [place] FDA and has been authorized for detection and/or diagnosis of SARS-CoV-2 by FDA under an Emergency Use Authorization (EUA). This EUA will remain in effect (meaning this test can be used) for the duration of the COVID-19 declaration under Section 564(b)(1) of the Act, 21 U.S.C. section 360bbb-3(b)(1), unless the authorization is terminated or revoked.  Performed at [facility], [address] [place], [place] [address]   MRSA Next Gen by PCR, Nasal     Status: None   Collection Time: 09/05/20  4:35 AM   Specimen: Nasal Mucosa; Nasal Swab  Result Value Ref Range Status   MRSA by PCR Next Gen NOT DETECTED NOT DETECTED Final    Comment: (NOTE) The GeneXpert MRSA Assay (FDA approved for NASAL specimens only), is one component of a comprehensive MRSA colonization surveillance program. It is not intended to diagnose MRSA infection nor to guide or monitor treatment for MRSA infections. Test performance is not FDA approved in patients less than 65 years old. Performed at [facility], [address], [place], [place] [address]   SARS CORONAVIRUS 2 (TAT 6-24 HRS) Nasopharyngeal Nasopharyngeal Swab     Status: None   Collection Time: 09/07/20  1:39 AM   Specimen: Nasopharyngeal Swab  Result Value Ref Range Status   SARS Coronavirus 2 NEGATIVE NEGATIVE Final    Comment: (NOTE) SARS-CoV-2 target nucleic acids are NOT DETECTED.  The SARS-CoV-2 RNA is generally detectable in upper and lower respiratory specimens during the acute phase of infection. Negative results do not preclude SARS-CoV-2 infection, do not rule out co-infections with other pathogens, and should not be used as the sole basis for treatment or other patient  management decisions. Negative results must be combined with clinical observations, patient history, and epidemiological information. The expected result is Negative.  Fact Sheet for Patients: HairSlick.no  Fact Sheet for Healthcare Providers: quierodirigir.com  This test is not yet approved or cleared by the [place] FDA and  has been authorized for detection and/or diagnosis of SARS-CoV-2 by FDA under an Emergency Use Authorization (EUA). This EUA will remain  in effect (meaning this test can be used) for the duration of the COVID-19 declaration under Se ction 564(b)(1) of the Act, 21 U.S.C. section 360bbb-3(b)(1), unless the authorization is terminated or revoked sooner.  Performed at [facility], [address] [place], [place] [address]  Labs: BNP (last 3 results) Recent Labs    12/31/19 1340 02/23/20 1202 09/04/20 1600  BNP 257* 215* 386.8*   Basic Metabolic Panel: Recent Labs  Lab 09/05/20 0315 09/06/20 0218 09/07/20 0258 09/08/20 0151 09/09/20 0207  NA 137 138 132* 134* 132*  K 3.0* 3.7 3.5 3.3* 3.9  CL 98 99 95* 97* 98  CO2 29 32 GLUCOSE 122* 101* 119* 129* 98  BUN 21 22 25* 32* 30*  CREATININE 0.78 0.91 0.97 0.94 0.83  CALCIUM 8.8* 9.0 9.0 8.8* 8.8*  MG 1.9  --   --   --   --   PHOS 2.9  --   --   --   --    Liver Function Tests: Recent Labs  Lab 09/04/20 1600 09/05/20 0315  AST 16 15  ALT 10 11  ALKPHOS 47 39  BILITOT 0.9 1.5*  PROT 7.8 7.2  ALBUMIN 3.1* 2.8*   Recent Labs  Lab 09/04/20 1600  LIPASE 31   No results for input(s): AMMONIA in the last 168 hours. CBC: Recent Labs  Lab 09/05/20 0315 09/06/20 0218 09/07/20 0258 09/08/20 0151 09/09/20 0207  WBC 8.3 6.8 9.6 6.7 6.4  NEUTROABS 6.0 4.2 7.0 4.2 3.9  HGB 12.9 12.7 12.2 11.7* 12.3  HCT 38.7 38.6 37.8 36.3 38.2  MCV 89.8 90.4 89.8 89.6 90.1  PLT 230 245 270 276 297   Cardiac Enzymes: No  results for input(s): CKTOTAL, CKMB, CKMBINDEX, TROPONINI in the last 168 hours. BNP: Invalid input(s): POCBNP CBG: Recent Labs  Lab 09/08/20 1953 09/08/20 2350 09/09/20 0416 09/09/20 0721 09/09/20 1136  GLUCAP 164* 121* 110* 121* 120*   D-Dimer No results for input(s): DDIMER in the last 72 hours. Hgb A1c No results for input(s): HGBA1C in the last 72 hours.  Lipid Profile No results for input(s): CHOL, HDL, LDLCALC, TRIG, CHOLHDL, LDLDIRECT in the last 72 hours. Thyroid function studies No results for input(s): TSH, T4TOTAL, T3FREE, THYROIDAB in the last 72 hours.  Invalid input(s): FREET3  Anemia work up No results for input(s): VITAMINB12, FOLATE, FERRITIN, TIBC, IRON, RETICCTPCT in the last 72 hours. Urinalysis    Component Value Date/Time   COLORURINE YELLOW 05/23/2020 2000   APPEARANCEUR CLEAR 05/23/2020 2000   LABSPEC 1.010 05/23/2020 2000   PHURINE 7.0 05/23/2020 2000   GLUCOSEU NEGATIVE 05/23/2020 2000   HGBUR NEGATIVE 05/23/2020 2000   BILIRUBINUR NEGATIVE 05/23/2020 2000   BILIRUBINUR negative 10/28/2017 0939   BILIRUBINUR NEGATIVE 07/04/2016 0845   KETONESUR NEGATIVE 05/23/2020 2000   PROTEINUR NEGATIVE 05/23/2020 2000   UROBILINOGEN 1.0 10/28/2017 0939   NITRITE NEGATIVE 05/23/2020 2000   LEUKOCYTESUR NEGATIVE 05/23/2020 2000   Sepsis Labs Invalid input(s): PROCALCITONIN,  WBC,  LACTICIDVEN Microbiology Recent Results (from the past 240 hour(s))  Resp Panel by RT-PCR (Flu A&B, Covid) Nasopharyngeal Swab     Status: None   Collection Time: 09/04/20  3:19 PM   Specimen: Nasopharyngeal Swab; Nasopharyngeal(NP) swabs in vial transport medium  Result Value Ref Range Status   SARS Coronavirus 2 by RT PCR NEGATIVE NEGATIVE Final    Comment: (NOTE) SARS-CoV-2 target nucleic acids are NOT DETECTED.  The SARS-CoV-2 RNA is generally detectable in upper respiratory specimens during the acute phase of infection. The lowest concentration of SARS-CoV-2 viral  copies this assay can detect is 138 copies/mL. A negative result does not preclude SARS-Cov-2 infection and should not be used as the sole basis for treatment or other patient management decisions. A  negative result may occur with  improper specimen collection/handling, submission of specimen other than nasopharyngeal swab, presence of viral mutation(s) within the areas targeted by this assay, and inadequate number of viral copies(<138 copies/mL). A negative result must be combined with clinical observations, patient history, and epidemiological information. The expected result is Negative.  Fact Sheet for Patients:  BloggerCourse.com  Fact Sheet for Healthcare Providers:  SeriousBroker.it  This test is no t yet approved or cleared by the [place] FDA and  has been authorized for detection and/or diagnosis of SARS-CoV-2 by FDA under an Emergency Use Authorization (EUA). This EUA will remain  in effect (meaning this test can be used) for the duration of the COVID-19 declaration under Section 564(b)(1) of the Act, 21 U.S.C.section 360bbb-3(b)(1), unless the authorization is terminated  or revoked sooner.       Influenza A by PCR NEGATIVE NEGATIVE Final   Influenza B by PCR NEGATIVE NEGATIVE Final    Comment: (NOTE) The Xpert Xpress SARS-CoV-2/FLU/RSV plus assay is intended as an aid in the diagnosis of influenza from Nasopharyngeal swab specimens and should not be used as a sole basis for treatment. Nasal washings and aspirates are unacceptable for Xpert Xpress SARS-CoV-2/FLU/RSV testing.  Fact Sheet for Patients: BloggerCourse.com  Fact Sheet for Healthcare Providers: SeriousBroker.it  This test is not yet approved or cleared by the [place] FDA and has been authorized for detection and/or diagnosis of SARS-CoV-2 by FDA under an Emergency Use Authorization (EUA). This  EUA will remain in effect (meaning this test can be used) for the duration of the COVID-19 declaration under Section 564(b)(1) of the Act, 21 U.S.C. section 360bbb-3(b)(1), unless the authorization is terminated or revoked.  Performed at [facility], [address] [place], [place] [address]   MRSA Next Gen by PCR, Nasal     Status: None   Collection Time: 09/05/20  4:35 AM   Specimen: Nasal Mucosa; Nasal Swab  Result Value Ref Range Status   MRSA by PCR Next Gen NOT DETECTED NOT DETECTED Final    Comment: (NOTE) The GeneXpert MRSA Assay (FDA approved for NASAL specimens only), is one component of a comprehensive MRSA colonization surveillance program. It is not intended to diagnose MRSA infection nor to guide or monitor treatment for MRSA infections. Test performance is not FDA approved in patients less than 3 years old. Performed at [facility], [address] [place], [place] [address]   SARS CORONAVIRUS 2 (TAT 6-24 HRS) Nasopharyngeal Nasopharyngeal Swab     Status: None   Collection Time: 09/07/20  1:39 AM   Specimen: Nasopharyngeal Swab  Result Value Ref Range Status   SARS Coronavirus 2 NEGATIVE NEGATIVE Final    Comment: (NOTE) SARS-CoV-2 target nucleic acids are NOT DETECTED.  The SARS-CoV-2 RNA is generally detectable in upper and lower respiratory specimens during the acute phase of infection. Negative results do not preclude SARS-CoV-2 infection, do not rule out co-infections with other pathogens, and should not be used as the sole basis for treatment or other patient management decisions. Negative results must be combined with clinical observations, patient history, and epidemiological information. The expected result is Negative.  Fact Sheet for Patients: HairSlick.no  Fact Sheet for Healthcare Providers: quierodirigir.com  This test is not yet approved or cleared by the [place] FDA and  has been authorized for detection and/or diagnosis of SARS-CoV-2 by FDA under an Emergency Use Authorization (EUA). This EUA will remain  in effect (meaning  this test can be used) for the duration of the COVID-19 declaration under Se ction 564(b)(1) of the Act, 21 U.S.C. section 360bbb-3(b)(1), unless the authorization is terminated or revoked sooner.  Performed at [facility], [address], [place], [place] [address]      Time coordinating discharge: 40 minutes  SIGNED:   Alba Cory, MD  [facility]

## 2020-09-09 NOTE — Progress Notes (Signed)
PROGRESS NOTE    Katherine Cervantes  OMV:[id number] DOB: 13-Jan-1930 DOA: 09/04/2020 PCP: Sunnie Nielsen, DO   Brief Narrative: 87 year old with past medical history significant for CAD, permanent A. fib, pacemaker secondary to sinus node dysfunction, presented to the ED with left hand cellulitis and chest pain. Neurology consulted for chest pain, no further recommendation, chest pain likely due to recent shingles outbreak.  Troponin unremarkable.    Assessment & Plan:   Active Problems:   Essential hypertension   CAD (coronary artery disease)   Permanent atrial fibrillation (HCC)   Sleep apnea   Pacemaker   Type 2 diabetes mellitus (HCC)   Hyperlipidemia   Morbid obesity with BMI of 40.0-44.9, adult (HCC)   Chronic anticoagulation   Venous insufficiency of both lower extremities   Chest pain   Chronic diastolic CHF (congestive heart failure) (HCC)   Cellulitis of hand   Cellulitis  1-Left upper extremity cellulitis: Doppler negative for DVT. X-ray show soft tissue swelling Plan to continue with vancomycin Redness and Edema has significantly improved Will plan to treat for 7 days. Will plan to discharge on clindamycin.  Day 5 vancomycin.  Improving, keep arm elevated.   2-Chest pain: Atypical: Cardiology consulted no further cardiac work-up planned at 3-Sleep apnea: Patient not on CPAP Permanent A. fib: Continue with Xarelto and Toprol Status post pacemaker for sick sinus syndrome Hypertension: CAD: Continue with beta-blocker Chronic diastolic heart failure: Received IV Lasix. Currently on oral lasix.  Continue with the spironolactone  DM ; A1 6.5.  Continue with Diet out patient.   Estimated body mass index is 39.16 kg/m as calculated from the following:   Height as of this encounter: 5\' 1"  (1.549 m).   Weight as of this encounter: 94 kg.   DVT prophylaxis: Xarelto Code Status: DNR Family Communication: Daughter who was at bedside.  Disposition Plan:   Status is: Inpatient  Remains inpatient appropriate because:Unsafe d/c plan  Dispo: The patient is from:  Assisting living facility              Anticipated d/c is to: SNF              Patient currently is medically stable to d/c.   Difficult to place patient No        Consultants:  Cardiology   Procedures:  None  Antimicrobials:  Vancomycin   Subjective: Redness and edema improving.    Objective: Vitals:   09/08/20 1937 09/09/20 0418 09/09/20 0723 09/09/20 1134  BP: 124/74 131/77 (!) 124/55 (!) 133/59  Pulse: 60 (!) 59 (!) 59 60  Resp: 17 17 17 17   Temp: 98.2 F (36.8 C) 97.8 F (36.6 C) 98 F (36.7 C) 98.3 F (36.8 C)  TempSrc: Oral Oral Oral Oral  SpO2: 95% 97% 96% 99%  Weight:  94 kg    Height:        Intake/Output Summary (Last 24 hours) at 09/09/2020 1401 Last data filed at 09/09/2020 6283 Gross per 24 hour  Intake 125 ml  Output 1250 ml  Net -1125 ml    Filed Weights   09/07/20 0113 09/08/20 0526 09/09/20 0418  Weight: 94.2 kg 94.2 kg 94 kg    Examination:  General exam: NAD Respiratory system: CTA Cardiovascular system: S 1, S 2 RRR Gastrointestinal system: BS present, soft,  nt Central nervous system: Alert Extremities: Symmetric power Skin: Right hand with less redness and swelling  Data Reviewed: I have personally reviewed following labs and imaging studies  CBC: Recent Labs  Lab 09/05/20 0315 09/06/20 0218 09/07/20 0258 09/08/20 0151 09/09/20 0207  WBC 8.3 6.8 9.6 6.7 6.4  NEUTROABS 6.0 4.2 7.0 4.2 3.9  HGB 12.9 12.7 12.2 11.7* 12.3  HCT 38.7 38.6 37.8 36.3 38.2  MCV 89.8 90.4 89.8 89.6 90.1  PLT 230 245 270 276 297    Basic Metabolic Panel: Recent Labs  Lab 09/05/20 0315 09/06/20 0218 09/07/20 0258 09/08/20 0151 09/09/20 0207  NA 137 138 132* 134* 132*  K 3.0* 3.7 3.5 3.3* 3.9  CL 98 99 95* 97* 98  CO2 29 32 27 28 24   GLUCOSE 122* 101* 119* 129* 98  BUN 21 22 25* 32* 30*  CREATININE 0.78 0.91 0.97 0.94  0.83  CALCIUM 8.8* 9.0 9.0 8.8* 8.8*  MG 1.9  --   --   --   --   PHOS 2.9  --   --   --   --     GFR: Estimated Creatinine Clearance: 48.1 mL/min (by C-G formula based on SCr of 0.83 mg/dL). Liver Function Tests: Recent Labs  Lab 09/04/20 1600 09/05/20 0315  AST 16 15  ALT 10 11  ALKPHOS 47 39  BILITOT 0.9 1.5*  PROT 7.8 7.2  ALBUMIN 3.1* 2.8*    Recent Labs  Lab 09/04/20 1600  LIPASE 31    No results for input(s): AMMONIA in the last 168 hours. Coagulation Profile: No results for input(s): INR, PROTIME in the last 168 hours. Cardiac Enzymes: No results for input(s): CKTOTAL, CKMB, CKMBINDEX, TROPONINI in the last 168 hours. BNP (last 3 results) No results for input(s): PROBNP in the last 8760 hours. HbA1C: No results for input(s): HGBA1C in the last 72 hours.  CBG: Recent Labs  Lab 09/08/20 1953 09/08/20 2350 09/09/20 0416 09/09/20 0721 09/09/20 1136  GLUCAP 164* 121* 110* 121* 120*    Lipid Profile: No results for input(s): CHOL, HDL, LDLCALC, TRIG, CHOLHDL, LDLDIRECT in the last 72 hours. Thyroid Function Tests: No results for input(s): TSH, T4TOTAL, FREET4, T3FREE, THYROIDAB in the last 72 hours.  Anemia Panel: No results for input(s): VITAMINB12, FOLATE, FERRITIN, TIBC, IRON, RETICCTPCT in the last 72 hours. Sepsis Labs: No results for input(s): PROCALCITON, LATICACIDVEN in the last 168 hours.  Recent Results (from the past 240 hour(s))  Resp Panel by RT-PCR (Flu A&B, Covid) Nasopharyngeal Swab     Status: None   Collection Time: 09/04/20  3:19 PM   Specimen: Nasopharyngeal Swab; Nasopharyngeal(NP) swabs in vial transport medium  Result Value Ref Range Status   SARS Coronavirus 2 by RT PCR NEGATIVE NEGATIVE Final    Comment: (NOTE) SARS-CoV-2 target nucleic acids are NOT DETECTED.  The SARS-CoV-2 RNA is generally detectable in upper respiratory specimens during the acute phase of infection. The lowest concentration of SARS-CoV-2 viral copies  this assay can detect is 138 copies/mL. A negative result does not preclude SARS-Cov-2 infection and should not be used as the sole basis for treatment or other patient management decisions. A negative result may occur with  improper specimen collection/handling, submission of specimen other than nasopharyngeal swab, presence of viral mutation(s) within the areas targeted by this assay, and inadequate number of viral copies(<138 copies/mL). A negative result must be combined with clinical observations, patient history, and epidemiological information. The expected result is Negative.  Fact Sheet for Patients:  09/06/20  Fact Sheet for Healthcare Providers:  BloggerCourse.com  This test is no t yet approved or cleared by the [employer]iousBroker.it and  has been authorized for detection and/or diagnosis of SARS-CoV-2 by FDA under an Emergency Use Authorization (EUA). This EUA will remain  in effect (meaning this test can be used) for the duration of the COVID-19 declaration under Section 564(b)(1) of the Act, 21 U.S.C.section 360bbb-3(b)(1), unless the authorization is terminated  or revoked sooner.       Influenza A by PCR NEGATIVE NEGATIVE Final   Influenza B by PCR NEGATIVE NEGATIVE Final    Comment: (NOTE) The Xpert Xpress SARS-CoV-2/FLU/RSV plus assay is intended as an aid in the diagnosis of influenza from Nasopharyngeal swab specimens and should not be used as a sole basis for treatment. Nasal washings and aspirates are unacceptable for Xpert Xpress SARS-CoV-2/FLU/RSV testing.  Fact Sheet for Patients: BloggerCourse.com  Fact Sheet for Healthcare Providers: SeriousBroker.it  This test is not yet approved or cleared by the [place] FDA and has been authorized for detection and/or diagnosis of SARS-CoV-2 by FDA under an Emergency Use Authorization (EUA). This EUA  will remain in effect (meaning this test can be used) for the duration of the COVID-19 declaration under Section 564(b)(1) of the Act, 21 U.S.C. section 360bbb-3(b)(1), unless the authorization is terminated or revoked.  Performed at [facility], [address] [place], [place] [address]   MRSA Next Gen by PCR, Nasal     Status: None   Collection Time: 09/05/20  4:35 AM   Specimen: Nasal Mucosa; Nasal Swab  Result Value Ref Range Status   MRSA by PCR Next Gen NOT DETECTED NOT DETECTED Final    Comment: (NOTE) The GeneXpert MRSA Assay (FDA approved for NASAL specimens only), is one component of a comprehensive MRSA colonization surveillance program. It is not intended to diagnose MRSA infection nor to guide or monitor treatment for MRSA infections. Test performance is not FDA approved in patients less than 24 years old. Performed at [facility], [address], [place], [place] [address]   SARS CORONAVIRUS 2 (TAT 6-24 HRS) Nasopharyngeal Nasopharyngeal Swab     Status: None   Collection Time: 09/07/20  1:39 AM   Specimen: Nasopharyngeal Swab  Result Value Ref Range Status   SARS Coronavirus 2 NEGATIVE NEGATIVE Final    Comment: (NOTE) SARS-CoV-2 target nucleic acids are NOT DETECTED.  The SARS-CoV-2 RNA is generally detectable in upper and lower respiratory specimens during the acute phase of infection. Negative results do not preclude SARS-CoV-2 infection, do not rule out co-infections with other pathogens, and should not be used as the sole basis for treatment or other patient management decisions. Negative results must be combined with clinical observations, patient history, and epidemiological information. The expected result is Negative.  Fact Sheet for Patients: HairSlick.no  Fact Sheet for Healthcare Providers: quierodirigir.com  This test is not yet approved or cleared by the [place]  FDA and  has been authorized for detection and/or diagnosis of SARS-CoV-2 by FDA under an Emergency Use Authorization (EUA). This EUA will remain  in effect (meaning this test can be used) for the duration of the COVID-19 declaration under Se ction 564(b)(1) of the Act, 21 U.S.C. section 360bbb-3(b)(1), unless the authorization is terminated or revoked sooner.  Performed at [facility], [address] [place], [place] [address]           Radiology Studies: No results found.      Scheduled Meds:  furosemide  80 mg Oral BID   gabapentin  400 mg Oral BID   insulin  aspart  0-9 Units Subcutaneous Q4H   metoprolol succinate  50 mg Oral Daily   nystatin   Topical TID   rivaroxaban  20 mg Oral Daily   sodium chloride flush  3 mL Intravenous Q12H   spironolactone  25 mg Oral Daily   Continuous Infusions:  sodium chloride Stopped (09/06/20 2356)   vancomycin 750 mg (09/09/20 0101)     LOS: 4 days    Time spent: 35 minutes    Tron Flythe A Monchel Pollitt, MD [facility]   If 7PM-7AM, please contact night-coverage [email or web address]  09/09/2020, 2:01 PM

## 2020-09-09 NOTE — Progress Notes (Signed)
Report called to April at [facility], pt awaiting ambulance for transport to facility

## 2020-09-09 NOTE — Progress Notes (Signed)
Pharmacy Antibiotic Note  Katherine Cervantes is a 87 y.o. female admitted on 09/04/2020 with hand cellulitis.  Pharmacy has been consulted for vancomycin dosing.  Plan: Vancomycin 750mg  IV Q24H until discharge When discharged, start clindamycin 300 mg QID x5 days  Height: 5\' 1"  (154.9 cm) Weight: 94 kg (207 lb 3.7 oz) IBW/kg (Calculated) : 47.8  Temp (24hrs), Avg:98.1 F (36.7 C), Min:97.8 F (36.6 C), Max:98.3 F (36.8 C)  Recent Labs  Lab 09/05/20 0315 09/06/20 0218 09/07/20 0258 09/08/20 0151 09/09/20 0207  WBC 8.3 6.8 9.6 6.7 6.4  CREATININE 0.78 0.91 0.97 0.94 0.83     Estimated Creatinine Clearance: 48.1 mL/min (by C-G formula based on SCr of 0.83 mg/dL).    Allergies  Allergen Reactions   Aspirin Anaphylaxis   Other Anaphylaxis   Penicillins Anaphylaxis    Anaphylaxis   Codeine Other (See Comments)    SOB Asthmatic issues   Levofloxacin In D5w Other (See Comments)    Disoriented, Hallucinations Other reaction(s): Other Disoriented, Hallucinations   Morphine And Related Other (See Comments)    Anaphylaxis   Sulfa Antibiotics Rash   Ether    Metoprolol     Possible  Per MAR   Prednisone Other (See Comments)    Lethargic, rash   Procaine     Thank you for allowing pharmacy to be a part of this patient's care.  09/10/20, PharmD PGY1 Pharmacy Resident 09/09/2020  12:43 PM  Please check [email or web address] for unit-specific pharmacy phone numbers.

## 2020-09-09 NOTE — TOC Progression Note (Addendum)
Transition of Care [facility]) - Progression Note    Patient Details  Name: Katherine Cervantes MRN: [id number] Date of Birth: 04/29/1929  Transition of Care [facility]) CM/SW Contact  Ivette Loyal, [place] Phone Number: 09/09/2020, 10:17 AM  Clinical Narrative:    CSW contacted [facility] for follow up on pt bed, receptionist informed CSW that there are no weekend admission bc they do not have the staff.  CSW followed up with pt daughter and provided some [place] locations that may accept weekend DC. Pt daughter would like to look at the distance of these facilities to her home and follow back up with CSW.  CSW contacted [facility] who does take weekend admits and will look at pt needs and follow up with CSW. CSW aslo contacted [place] with no response yet, CSW left VM.  Alinda Money from [facility] informed CSW that there were no bed availability but [place] could take pt if the facility on the auth can be changed for today. CSW will follow up with navi for facility change.  Facility changed with [facility], CSW waiting for final auth. New ref# [id number]  Final authorization has been approved pt will DC to [place] pt daughter has been updated. CSW will follow up with MD and RN on DC.   Expected Discharge Plan: Skilled Nursing Facility Barriers to Discharge: Continued Medical Work up  Expected Discharge Plan and Services Expected Discharge Plan: Skilled Nursing Facility In-house Referral: Clinical Social Work Discharge Planning Services: NA Post Acute Care Choice: Skilled Nursing Facility Living arrangements for the past 2 months: Assisted Living Facility Expected Discharge Date: 09/07/20                                     Social Determinants of Health (SDOH) Interventions    Readmission Risk Interventions No flowsheet data found.

## 2020-09-09 NOTE — TOC Transition Note (Signed)
Transition of Care [facility]) - CM/SW Discharge Note   Patient Details  Name: Katherine Cervantes MRN: [id number] Date of Birth: April 18, 1929  Transition of Care [facility]) CM/SW Contact:  Lynett Grimes Phone Number: 09/09/2020, 2:52 PM   Clinical Narrative:    Patient will DC to: [place] Anticipated DC date: 09/09/2020 Family notified: Pt daughter Transport by: Sharin Mons   Per MD patient ready for DC to [facility]. RN to call report prior to discharge [phone]). RN, patient, patient's family, and facility notified of DC. Discharge Summary and FL2 sent to facility. DC packet on chart. Ambulance transport requested for patient.   CSW will sign off for now as social work intervention is no longer needed. Please consult [place] again if new needs arise.     Final next level of care: Skilled Nursing Facility Barriers to Discharge: Continued Medical Work up   Patient Goals and CMS Choice Patient states their goals for this hospitalization and ongoing recovery are:: Rehab CMS Medicare.gov Compare Post Acute Care list provided to:: Patient Choice offered to / list presented to : Patient  Discharge Placement                       Discharge Plan and Services In-house Referral: Clinical Social Work Discharge Planning Services: NA Post Acute Care Choice: Skilled Nursing Facility                               Social Determinants of Health (SDOH) Interventions     Readmission Risk Interventions No flowsheet data found.

## 2020-09-12 DIAGNOSIS — G4730 Sleep apnea, unspecified: Secondary | ICD-10-CM | POA: Diagnosis not present

## 2020-09-12 DIAGNOSIS — I2510 Atherosclerotic heart disease of native coronary artery without angina pectoris: Secondary | ICD-10-CM | POA: Diagnosis not present

## 2020-09-12 DIAGNOSIS — I4821 Permanent atrial fibrillation: Secondary | ICD-10-CM | POA: Diagnosis not present

## 2020-09-12 DIAGNOSIS — E785 Hyperlipidemia, unspecified: Secondary | ICD-10-CM | POA: Diagnosis not present

## 2020-09-12 DIAGNOSIS — L03114 Cellulitis of left upper limb: Secondary | ICD-10-CM | POA: Diagnosis not present

## 2020-09-12 DIAGNOSIS — I5032 Chronic diastolic (congestive) heart failure: Secondary | ICD-10-CM | POA: Diagnosis not present

## 2020-09-12 DIAGNOSIS — E119 Type 2 diabetes mellitus without complications: Secondary | ICD-10-CM | POA: Diagnosis not present

## 2020-09-12 DIAGNOSIS — I10 Essential (primary) hypertension: Secondary | ICD-10-CM | POA: Diagnosis not present

## 2020-09-14 DIAGNOSIS — E119 Type 2 diabetes mellitus without complications: Secondary | ICD-10-CM | POA: Diagnosis not present

## 2020-09-14 DIAGNOSIS — L03114 Cellulitis of left upper limb: Secondary | ICD-10-CM | POA: Diagnosis not present

## 2020-09-14 DIAGNOSIS — I2510 Atherosclerotic heart disease of native coronary artery without angina pectoris: Secondary | ICD-10-CM | POA: Diagnosis not present

## 2020-09-14 DIAGNOSIS — I10 Essential (primary) hypertension: Secondary | ICD-10-CM | POA: Diagnosis not present

## 2020-09-14 DIAGNOSIS — E785 Hyperlipidemia, unspecified: Secondary | ICD-10-CM | POA: Diagnosis not present

## 2020-09-14 DIAGNOSIS — I5032 Chronic diastolic (congestive) heart failure: Secondary | ICD-10-CM | POA: Diagnosis not present

## 2020-09-20 DIAGNOSIS — I10 Essential (primary) hypertension: Secondary | ICD-10-CM | POA: Diagnosis not present

## 2020-09-20 DIAGNOSIS — L03114 Cellulitis of left upper limb: Secondary | ICD-10-CM | POA: Diagnosis not present

## 2020-09-20 DIAGNOSIS — I5032 Chronic diastolic (congestive) heart failure: Secondary | ICD-10-CM | POA: Diagnosis not present

## 2020-09-20 DIAGNOSIS — E785 Hyperlipidemia, unspecified: Secondary | ICD-10-CM | POA: Diagnosis not present

## 2020-09-20 DIAGNOSIS — I2510 Atherosclerotic heart disease of native coronary artery without angina pectoris: Secondary | ICD-10-CM | POA: Diagnosis not present

## 2020-09-20 DIAGNOSIS — E119 Type 2 diabetes mellitus without complications: Secondary | ICD-10-CM | POA: Diagnosis not present

## 2020-09-26 DIAGNOSIS — I10 Essential (primary) hypertension: Secondary | ICD-10-CM | POA: Diagnosis not present

## 2020-09-26 DIAGNOSIS — D519 Vitamin B12 deficiency anemia, unspecified: Secondary | ICD-10-CM | POA: Diagnosis not present

## 2020-09-26 DIAGNOSIS — Z79899 Other long term (current) drug therapy: Secondary | ICD-10-CM | POA: Diagnosis not present

## 2020-09-26 DIAGNOSIS — E559 Vitamin D deficiency, unspecified: Secondary | ICD-10-CM | POA: Diagnosis not present

## 2020-09-29 DIAGNOSIS — I5032 Chronic diastolic (congestive) heart failure: Secondary | ICD-10-CM | POA: Diagnosis not present

## 2020-09-29 DIAGNOSIS — I2510 Atherosclerotic heart disease of native coronary artery without angina pectoris: Secondary | ICD-10-CM | POA: Diagnosis not present

## 2020-09-29 DIAGNOSIS — I10 Essential (primary) hypertension: Secondary | ICD-10-CM | POA: Diagnosis not present

## 2020-09-29 DIAGNOSIS — L03114 Cellulitis of left upper limb: Secondary | ICD-10-CM | POA: Diagnosis not present

## 2020-09-29 DIAGNOSIS — E119 Type 2 diabetes mellitus without complications: Secondary | ICD-10-CM | POA: Diagnosis not present

## 2020-09-29 DIAGNOSIS — I872 Venous insufficiency (chronic) (peripheral): Secondary | ICD-10-CM | POA: Diagnosis not present

## 2020-09-29 DIAGNOSIS — E785 Hyperlipidemia, unspecified: Secondary | ICD-10-CM | POA: Diagnosis not present

## 2020-10-04 DIAGNOSIS — R899 Unspecified abnormal finding in specimens from other organs, systems and tissues: Secondary | ICD-10-CM | POA: Diagnosis not present

## 2020-10-04 DIAGNOSIS — N183 Chronic kidney disease, stage 3 unspecified: Secondary | ICD-10-CM | POA: Diagnosis not present

## 2020-10-06 DIAGNOSIS — N183 Chronic kidney disease, stage 3 unspecified: Secondary | ICD-10-CM | POA: Diagnosis not present

## 2020-10-06 DIAGNOSIS — I10 Essential (primary) hypertension: Secondary | ICD-10-CM | POA: Diagnosis not present

## 2020-10-06 DIAGNOSIS — I872 Venous insufficiency (chronic) (peripheral): Secondary | ICD-10-CM | POA: Diagnosis not present

## 2020-10-06 DIAGNOSIS — I4821 Permanent atrial fibrillation: Secondary | ICD-10-CM | POA: Diagnosis not present

## 2020-10-06 DIAGNOSIS — Z7901 Long term (current) use of anticoagulants: Secondary | ICD-10-CM | POA: Diagnosis not present

## 2020-10-06 DIAGNOSIS — E119 Type 2 diabetes mellitus without complications: Secondary | ICD-10-CM | POA: Diagnosis not present

## 2020-10-06 DIAGNOSIS — I2510 Atherosclerotic heart disease of native coronary artery without angina pectoris: Secondary | ICD-10-CM | POA: Diagnosis not present

## 2020-10-12 DIAGNOSIS — N390 Urinary tract infection, site not specified: Secondary | ICD-10-CM | POA: Diagnosis not present

## 2020-10-13 DIAGNOSIS — I10 Essential (primary) hypertension: Secondary | ICD-10-CM | POA: Diagnosis not present

## 2020-10-13 DIAGNOSIS — G4730 Sleep apnea, unspecified: Secondary | ICD-10-CM | POA: Diagnosis not present

## 2020-10-13 DIAGNOSIS — E119 Type 2 diabetes mellitus without complications: Secondary | ICD-10-CM | POA: Diagnosis not present

## 2020-10-13 DIAGNOSIS — I2510 Atherosclerotic heart disease of native coronary artery without angina pectoris: Secondary | ICD-10-CM | POA: Diagnosis not present

## 2020-10-13 DIAGNOSIS — I5032 Chronic diastolic (congestive) heart failure: Secondary | ICD-10-CM | POA: Diagnosis not present

## 2020-10-13 DIAGNOSIS — I4821 Permanent atrial fibrillation: Secondary | ICD-10-CM | POA: Diagnosis not present

## 2020-10-13 DIAGNOSIS — N183 Chronic kidney disease, stage 3 unspecified: Secondary | ICD-10-CM | POA: Diagnosis not present

## 2020-10-13 DIAGNOSIS — N390 Urinary tract infection, site not specified: Secondary | ICD-10-CM | POA: Diagnosis not present

## 2020-10-19 DIAGNOSIS — E1022 Type 1 diabetes mellitus with diabetic chronic kidney disease: Secondary | ICD-10-CM | POA: Diagnosis not present

## 2020-10-19 DIAGNOSIS — I2510 Atherosclerotic heart disease of native coronary artery without angina pectoris: Secondary | ICD-10-CM | POA: Diagnosis not present

## 2020-10-19 DIAGNOSIS — E785 Hyperlipidemia, unspecified: Secondary | ICD-10-CM | POA: Diagnosis not present

## 2020-10-19 DIAGNOSIS — G4730 Sleep apnea, unspecified: Secondary | ICD-10-CM | POA: Diagnosis not present

## 2020-10-19 DIAGNOSIS — I5032 Chronic diastolic (congestive) heart failure: Secondary | ICD-10-CM | POA: Diagnosis not present

## 2020-10-19 DIAGNOSIS — N183 Chronic kidney disease, stage 3 unspecified: Secondary | ICD-10-CM | POA: Diagnosis not present

## 2020-10-19 DIAGNOSIS — I10 Essential (primary) hypertension: Secondary | ICD-10-CM | POA: Diagnosis not present

## 2020-10-19 DIAGNOSIS — N390 Urinary tract infection, site not specified: Secondary | ICD-10-CM | POA: Diagnosis not present

## 2020-10-19 DIAGNOSIS — I4821 Permanent atrial fibrillation: Secondary | ICD-10-CM | POA: Diagnosis not present

## 2020-10-19 DIAGNOSIS — E119 Type 2 diabetes mellitus without complications: Secondary | ICD-10-CM | POA: Diagnosis not present

## 2020-11-10 DIAGNOSIS — I10 Essential (primary) hypertension: Secondary | ICD-10-CM | POA: Diagnosis not present

## 2020-11-10 DIAGNOSIS — E785 Hyperlipidemia, unspecified: Secondary | ICD-10-CM | POA: Diagnosis not present

## 2020-11-10 DIAGNOSIS — N183 Chronic kidney disease, stage 3 unspecified: Secondary | ICD-10-CM | POA: Diagnosis not present

## 2020-11-10 DIAGNOSIS — E119 Type 2 diabetes mellitus without complications: Secondary | ICD-10-CM | POA: Diagnosis not present

## 2020-11-10 DIAGNOSIS — G629 Polyneuropathy, unspecified: Secondary | ICD-10-CM | POA: Diagnosis not present

## 2020-11-10 DIAGNOSIS — I5032 Chronic diastolic (congestive) heart failure: Secondary | ICD-10-CM | POA: Diagnosis not present

## 2020-11-10 DIAGNOSIS — I2510 Atherosclerotic heart disease of native coronary artery without angina pectoris: Secondary | ICD-10-CM | POA: Diagnosis not present

## 2020-11-10 DIAGNOSIS — G4730 Sleep apnea, unspecified: Secondary | ICD-10-CM | POA: Diagnosis not present

## 2020-11-16 DIAGNOSIS — N183 Chronic kidney disease, stage 3 unspecified: Secondary | ICD-10-CM | POA: Diagnosis not present

## 2020-11-16 DIAGNOSIS — E119 Type 2 diabetes mellitus without complications: Secondary | ICD-10-CM | POA: Diagnosis not present

## 2020-11-16 DIAGNOSIS — I5032 Chronic diastolic (congestive) heart failure: Secondary | ICD-10-CM | POA: Diagnosis not present

## 2020-11-16 DIAGNOSIS — G4730 Sleep apnea, unspecified: Secondary | ICD-10-CM | POA: Diagnosis not present

## 2020-11-16 DIAGNOSIS — I4821 Permanent atrial fibrillation: Secondary | ICD-10-CM | POA: Diagnosis not present

## 2020-11-16 DIAGNOSIS — I10 Essential (primary) hypertension: Secondary | ICD-10-CM | POA: Diagnosis not present

## 2020-11-16 DIAGNOSIS — I2510 Atherosclerotic heart disease of native coronary artery without angina pectoris: Secondary | ICD-10-CM | POA: Diagnosis not present

## 2020-11-16 DIAGNOSIS — E785 Hyperlipidemia, unspecified: Secondary | ICD-10-CM | POA: Diagnosis not present

## 2020-11-17 ENCOUNTER — Ambulatory Visit (INDEPENDENT_AMBULATORY_CARE_PROVIDER_SITE_OTHER): Payer: Medicare Other

## 2020-11-17 DIAGNOSIS — I495 Sick sinus syndrome: Secondary | ICD-10-CM | POA: Diagnosis not present

## 2020-11-19 LAB — CUP PACEART REMOTE DEVICE CHECK
Battery Remaining Longevity: 100 mo
Battery Remaining Percentage: 84 %
Battery Voltage: 3.02 V
Brady Statistic RV Percent Paced: 98 %
Date Time Interrogation Session: 20221007020017
Implantable Lead Implant Date: 20090521
Implantable Lead Implant Date: 20090521
Implantable Lead Location: 753859
Implantable Lead Location: 753860
Implantable Pulse Generator Implant Date: 20201002
Lead Channel Impedance Value: 430 Ohm
Lead Channel Pacing Threshold Amplitude: 1.25 V
Lead Channel Pacing Threshold Pulse Width: 0.7 ms
Lead Channel Sensing Intrinsic Amplitude: 10 mV
Lead Channel Setting Pacing Amplitude: 1.5 V
Lead Channel Setting Pacing Pulse Width: 0.7 ms
Lead Channel Setting Sensing Sensitivity: 2 mV
Pulse Gen Model: 2272
Pulse Gen Serial Number: 9159208

## 2020-11-27 NOTE — Progress Notes (Signed)
Remote pacemaker transmission.   

## 2020-12-08 DIAGNOSIS — E119 Type 2 diabetes mellitus without complications: Secondary | ICD-10-CM | POA: Diagnosis not present

## 2020-12-08 DIAGNOSIS — I10 Essential (primary) hypertension: Secondary | ICD-10-CM | POA: Diagnosis not present

## 2020-12-08 DIAGNOSIS — I5032 Chronic diastolic (congestive) heart failure: Secondary | ICD-10-CM | POA: Diagnosis not present

## 2020-12-08 DIAGNOSIS — E785 Hyperlipidemia, unspecified: Secondary | ICD-10-CM | POA: Diagnosis not present

## 2020-12-08 DIAGNOSIS — I4821 Permanent atrial fibrillation: Secondary | ICD-10-CM | POA: Diagnosis not present

## 2021-02-12 DIAGNOSIS — R278 Other lack of coordination: Secondary | ICD-10-CM | POA: Diagnosis not present

## 2021-02-12 DIAGNOSIS — I5032 Chronic diastolic (congestive) heart failure: Secondary | ICD-10-CM | POA: Diagnosis not present

## 2021-02-12 DIAGNOSIS — M6281 Muscle weakness (generalized): Secondary | ICD-10-CM | POA: Diagnosis not present

## 2021-02-14 DIAGNOSIS — I5032 Chronic diastolic (congestive) heart failure: Secondary | ICD-10-CM | POA: Diagnosis not present

## 2021-02-14 DIAGNOSIS — M6281 Muscle weakness (generalized): Secondary | ICD-10-CM | POA: Diagnosis not present

## 2021-02-14 DIAGNOSIS — R278 Other lack of coordination: Secondary | ICD-10-CM | POA: Diagnosis not present

## 2021-02-15 DIAGNOSIS — M6281 Muscle weakness (generalized): Secondary | ICD-10-CM | POA: Diagnosis not present

## 2021-02-15 DIAGNOSIS — I5032 Chronic diastolic (congestive) heart failure: Secondary | ICD-10-CM | POA: Diagnosis not present

## 2021-02-15 DIAGNOSIS — R278 Other lack of coordination: Secondary | ICD-10-CM | POA: Diagnosis not present

## 2021-02-16 ENCOUNTER — Ambulatory Visit (INDEPENDENT_AMBULATORY_CARE_PROVIDER_SITE_OTHER): Payer: Medicare Other

## 2021-02-16 DIAGNOSIS — R278 Other lack of coordination: Secondary | ICD-10-CM | POA: Diagnosis not present

## 2021-02-16 DIAGNOSIS — I5032 Chronic diastolic (congestive) heart failure: Secondary | ICD-10-CM | POA: Diagnosis not present

## 2021-02-16 DIAGNOSIS — I495 Sick sinus syndrome: Secondary | ICD-10-CM | POA: Diagnosis not present

## 2021-02-16 DIAGNOSIS — M6281 Muscle weakness (generalized): Secondary | ICD-10-CM | POA: Diagnosis not present

## 2021-02-16 LAB — CUP PACEART REMOTE DEVICE CHECK
Battery Remaining Longevity: 96 mo
Battery Remaining Percentage: 82 %
Battery Voltage: 3.02 V
Brady Statistic RV Percent Paced: 96 %
Date Time Interrogation Session: 20230106020013
Implantable Lead Implant Date: 20090521
Implantable Lead Implant Date: 20090521
Implantable Lead Location: 753859
Implantable Lead Location: 753860
Implantable Pulse Generator Implant Date: 20201002
Lead Channel Impedance Value: 380 Ohm
Lead Channel Pacing Threshold Amplitude: 1.375 V
Lead Channel Pacing Threshold Pulse Width: 0.7 ms
Lead Channel Sensing Intrinsic Amplitude: 12 mV
Lead Channel Setting Pacing Amplitude: 1.625
Lead Channel Setting Pacing Pulse Width: 0.7 ms
Lead Channel Setting Sensing Sensitivity: 2 mV
Pulse Gen Model: 2272
Pulse Gen Serial Number: 9159208

## 2021-02-18 DIAGNOSIS — R278 Other lack of coordination: Secondary | ICD-10-CM | POA: Diagnosis not present

## 2021-02-18 DIAGNOSIS — M6281 Muscle weakness (generalized): Secondary | ICD-10-CM | POA: Diagnosis not present

## 2021-02-18 DIAGNOSIS — I5032 Chronic diastolic (congestive) heart failure: Secondary | ICD-10-CM | POA: Diagnosis not present

## 2021-02-19 DIAGNOSIS — M6281 Muscle weakness (generalized): Secondary | ICD-10-CM | POA: Diagnosis not present

## 2021-02-19 DIAGNOSIS — R278 Other lack of coordination: Secondary | ICD-10-CM | POA: Diagnosis not present

## 2021-02-19 DIAGNOSIS — I5032 Chronic diastolic (congestive) heart failure: Secondary | ICD-10-CM | POA: Diagnosis not present

## 2021-02-21 DIAGNOSIS — M6281 Muscle weakness (generalized): Secondary | ICD-10-CM | POA: Diagnosis not present

## 2021-02-21 DIAGNOSIS — I5032 Chronic diastolic (congestive) heart failure: Secondary | ICD-10-CM | POA: Diagnosis not present

## 2021-02-21 DIAGNOSIS — R278 Other lack of coordination: Secondary | ICD-10-CM | POA: Diagnosis not present

## 2021-02-23 DIAGNOSIS — I5032 Chronic diastolic (congestive) heart failure: Secondary | ICD-10-CM | POA: Diagnosis not present

## 2021-02-23 DIAGNOSIS — N390 Urinary tract infection, site not specified: Secondary | ICD-10-CM | POA: Diagnosis not present

## 2021-02-23 DIAGNOSIS — M6281 Muscle weakness (generalized): Secondary | ICD-10-CM | POA: Diagnosis not present

## 2021-02-23 DIAGNOSIS — R278 Other lack of coordination: Secondary | ICD-10-CM | POA: Diagnosis not present

## 2021-02-25 DIAGNOSIS — R278 Other lack of coordination: Secondary | ICD-10-CM | POA: Diagnosis not present

## 2021-02-25 DIAGNOSIS — M6281 Muscle weakness (generalized): Secondary | ICD-10-CM | POA: Diagnosis not present

## 2021-02-25 DIAGNOSIS — I5032 Chronic diastolic (congestive) heart failure: Secondary | ICD-10-CM | POA: Diagnosis not present

## 2021-02-26 NOTE — Progress Notes (Signed)
Remote pacemaker transmission.   

## 2021-02-27 DIAGNOSIS — M6281 Muscle weakness (generalized): Secondary | ICD-10-CM | POA: Diagnosis not present

## 2021-02-27 DIAGNOSIS — I5032 Chronic diastolic (congestive) heart failure: Secondary | ICD-10-CM | POA: Diagnosis not present

## 2021-02-27 DIAGNOSIS — R278 Other lack of coordination: Secondary | ICD-10-CM | POA: Diagnosis not present

## 2021-02-28 DIAGNOSIS — R278 Other lack of coordination: Secondary | ICD-10-CM | POA: Diagnosis not present

## 2021-02-28 DIAGNOSIS — I5032 Chronic diastolic (congestive) heart failure: Secondary | ICD-10-CM | POA: Diagnosis not present

## 2021-02-28 DIAGNOSIS — M6281 Muscle weakness (generalized): Secondary | ICD-10-CM | POA: Diagnosis not present

## 2021-03-02 DIAGNOSIS — K219 Gastro-esophageal reflux disease without esophagitis: Secondary | ICD-10-CM | POA: Diagnosis not present

## 2021-03-02 DIAGNOSIS — N390 Urinary tract infection, site not specified: Secondary | ICD-10-CM | POA: Diagnosis not present

## 2021-03-02 DIAGNOSIS — I872 Venous insufficiency (chronic) (peripheral): Secondary | ICD-10-CM | POA: Diagnosis not present

## 2021-03-02 DIAGNOSIS — I2510 Atherosclerotic heart disease of native coronary artery without angina pectoris: Secondary | ICD-10-CM | POA: Diagnosis not present

## 2021-03-02 DIAGNOSIS — E119 Type 2 diabetes mellitus without complications: Secondary | ICD-10-CM | POA: Diagnosis not present

## 2021-03-02 DIAGNOSIS — E785 Hyperlipidemia, unspecified: Secondary | ICD-10-CM | POA: Diagnosis not present

## 2021-03-02 DIAGNOSIS — Z950 Presence of cardiac pacemaker: Secondary | ICD-10-CM | POA: Diagnosis not present

## 2021-03-02 DIAGNOSIS — G4730 Sleep apnea, unspecified: Secondary | ICD-10-CM | POA: Diagnosis not present

## 2021-03-02 DIAGNOSIS — N183 Chronic kidney disease, stage 3 unspecified: Secondary | ICD-10-CM | POA: Diagnosis not present

## 2021-03-02 DIAGNOSIS — G629 Polyneuropathy, unspecified: Secondary | ICD-10-CM | POA: Diagnosis not present

## 2021-03-02 DIAGNOSIS — I4821 Permanent atrial fibrillation: Secondary | ICD-10-CM | POA: Diagnosis not present

## 2021-03-02 DIAGNOSIS — I5032 Chronic diastolic (congestive) heart failure: Secondary | ICD-10-CM | POA: Diagnosis not present

## 2021-03-08 DIAGNOSIS — I5032 Chronic diastolic (congestive) heart failure: Secondary | ICD-10-CM | POA: Diagnosis not present

## 2021-03-08 DIAGNOSIS — G629 Polyneuropathy, unspecified: Secondary | ICD-10-CM | POA: Diagnosis not present

## 2021-03-08 DIAGNOSIS — I2510 Atherosclerotic heart disease of native coronary artery without angina pectoris: Secondary | ICD-10-CM | POA: Diagnosis not present

## 2021-03-08 DIAGNOSIS — I129 Hypertensive chronic kidney disease with stage 1 through stage 4 chronic kidney disease, or unspecified chronic kidney disease: Secondary | ICD-10-CM | POA: Diagnosis not present

## 2021-03-08 DIAGNOSIS — I872 Venous insufficiency (chronic) (peripheral): Secondary | ICD-10-CM | POA: Diagnosis not present

## 2021-03-08 DIAGNOSIS — E119 Type 2 diabetes mellitus without complications: Secondary | ICD-10-CM | POA: Diagnosis not present

## 2021-03-08 DIAGNOSIS — G4730 Sleep apnea, unspecified: Secondary | ICD-10-CM | POA: Diagnosis not present

## 2021-03-08 DIAGNOSIS — K219 Gastro-esophageal reflux disease without esophagitis: Secondary | ICD-10-CM | POA: Diagnosis not present

## 2021-03-08 DIAGNOSIS — I4821 Permanent atrial fibrillation: Secondary | ICD-10-CM | POA: Diagnosis not present

## 2021-03-08 DIAGNOSIS — N183 Chronic kidney disease, stage 3 unspecified: Secondary | ICD-10-CM | POA: Diagnosis not present

## 2021-03-08 DIAGNOSIS — E785 Hyperlipidemia, unspecified: Secondary | ICD-10-CM | POA: Diagnosis not present

## 2021-03-08 DIAGNOSIS — R0789 Other chest pain: Secondary | ICD-10-CM | POA: Diagnosis not present

## 2021-03-12 DIAGNOSIS — E1022 Type 1 diabetes mellitus with diabetic chronic kidney disease: Secondary | ICD-10-CM | POA: Diagnosis not present

## 2021-03-12 DIAGNOSIS — I129 Hypertensive chronic kidney disease with stage 1 through stage 4 chronic kidney disease, or unspecified chronic kidney disease: Secondary | ICD-10-CM | POA: Diagnosis not present

## 2021-03-12 DIAGNOSIS — I5032 Chronic diastolic (congestive) heart failure: Secondary | ICD-10-CM | POA: Diagnosis not present

## 2021-03-12 DIAGNOSIS — I10 Essential (primary) hypertension: Secondary | ICD-10-CM | POA: Diagnosis not present

## 2021-03-12 DIAGNOSIS — E785 Hyperlipidemia, unspecified: Secondary | ICD-10-CM | POA: Diagnosis not present

## 2021-03-12 DIAGNOSIS — N183 Chronic kidney disease, stage 3 unspecified: Secondary | ICD-10-CM | POA: Diagnosis not present

## 2021-03-12 DIAGNOSIS — I2510 Atherosclerotic heart disease of native coronary artery without angina pectoris: Secondary | ICD-10-CM | POA: Diagnosis not present

## 2021-03-12 DIAGNOSIS — E119 Type 2 diabetes mellitus without complications: Secondary | ICD-10-CM | POA: Diagnosis not present

## 2021-03-18 DIAGNOSIS — R0902 Hypoxemia: Secondary | ICD-10-CM | POA: Diagnosis not present

## 2021-03-30 DIAGNOSIS — I10 Essential (primary) hypertension: Secondary | ICD-10-CM | POA: Diagnosis not present

## 2021-03-30 DIAGNOSIS — N183 Chronic kidney disease, stage 3 unspecified: Secondary | ICD-10-CM | POA: Diagnosis not present

## 2021-03-30 DIAGNOSIS — I5032 Chronic diastolic (congestive) heart failure: Secondary | ICD-10-CM | POA: Diagnosis not present

## 2021-03-30 DIAGNOSIS — E119 Type 2 diabetes mellitus without complications: Secondary | ICD-10-CM | POA: Diagnosis not present

## 2021-03-30 DIAGNOSIS — E785 Hyperlipidemia, unspecified: Secondary | ICD-10-CM | POA: Diagnosis not present

## 2021-03-30 DIAGNOSIS — I2510 Atherosclerotic heart disease of native coronary artery without angina pectoris: Secondary | ICD-10-CM | POA: Diagnosis not present

## 2021-03-30 DIAGNOSIS — E1022 Type 1 diabetes mellitus with diabetic chronic kidney disease: Secondary | ICD-10-CM | POA: Diagnosis not present

## 2021-03-30 DIAGNOSIS — I129 Hypertensive chronic kidney disease with stage 1 through stage 4 chronic kidney disease, or unspecified chronic kidney disease: Secondary | ICD-10-CM | POA: Diagnosis not present

## 2021-04-04 DIAGNOSIS — E1159 Type 2 diabetes mellitus with other circulatory complications: Secondary | ICD-10-CM | POA: Diagnosis not present

## 2021-04-04 DIAGNOSIS — I2510 Atherosclerotic heart disease of native coronary artery without angina pectoris: Secondary | ICD-10-CM | POA: Diagnosis not present

## 2021-04-04 DIAGNOSIS — I480 Paroxysmal atrial fibrillation: Secondary | ICD-10-CM | POA: Diagnosis not present

## 2021-04-04 DIAGNOSIS — I129 Hypertensive chronic kidney disease with stage 1 through stage 4 chronic kidney disease, or unspecified chronic kidney disease: Secondary | ICD-10-CM | POA: Diagnosis not present

## 2021-04-04 DIAGNOSIS — Z950 Presence of cardiac pacemaker: Secondary | ICD-10-CM | POA: Diagnosis not present

## 2021-04-04 DIAGNOSIS — I10 Essential (primary) hypertension: Secondary | ICD-10-CM | POA: Diagnosis not present

## 2021-04-04 DIAGNOSIS — R54 Age-related physical debility: Secondary | ICD-10-CM | POA: Diagnosis not present

## 2021-04-04 DIAGNOSIS — I739 Peripheral vascular disease, unspecified: Secondary | ICD-10-CM | POA: Diagnosis not present

## 2021-04-04 DIAGNOSIS — Z7901 Long term (current) use of anticoagulants: Secondary | ICD-10-CM | POA: Diagnosis not present

## 2021-04-04 DIAGNOSIS — M6281 Muscle weakness (generalized): Secondary | ICD-10-CM | POA: Diagnosis not present

## 2021-04-04 DIAGNOSIS — I5032 Chronic diastolic (congestive) heart failure: Secondary | ICD-10-CM | POA: Diagnosis not present

## 2021-04-04 DIAGNOSIS — I872 Venous insufficiency (chronic) (peripheral): Secondary | ICD-10-CM | POA: Diagnosis not present

## 2021-04-05 DIAGNOSIS — M6259 Muscle wasting and atrophy, not elsewhere classified, multiple sites: Secondary | ICD-10-CM | POA: Diagnosis not present

## 2021-04-06 DIAGNOSIS — R54 Age-related physical debility: Secondary | ICD-10-CM | POA: Diagnosis not present

## 2021-04-06 DIAGNOSIS — I480 Paroxysmal atrial fibrillation: Secondary | ICD-10-CM | POA: Diagnosis not present

## 2021-04-06 DIAGNOSIS — I872 Venous insufficiency (chronic) (peripheral): Secondary | ICD-10-CM | POA: Diagnosis not present

## 2021-04-06 DIAGNOSIS — I10 Essential (primary) hypertension: Secondary | ICD-10-CM | POA: Diagnosis not present

## 2021-04-06 DIAGNOSIS — I739 Peripheral vascular disease, unspecified: Secondary | ICD-10-CM | POA: Diagnosis not present

## 2021-04-06 DIAGNOSIS — I2510 Atherosclerotic heart disease of native coronary artery without angina pectoris: Secondary | ICD-10-CM | POA: Diagnosis not present

## 2021-04-06 DIAGNOSIS — I5032 Chronic diastolic (congestive) heart failure: Secondary | ICD-10-CM | POA: Diagnosis not present

## 2021-04-06 DIAGNOSIS — I129 Hypertensive chronic kidney disease with stage 1 through stage 4 chronic kidney disease, or unspecified chronic kidney disease: Secondary | ICD-10-CM | POA: Diagnosis not present

## 2021-04-06 DIAGNOSIS — M6259 Muscle wasting and atrophy, not elsewhere classified, multiple sites: Secondary | ICD-10-CM | POA: Diagnosis not present

## 2021-04-06 DIAGNOSIS — E1159 Type 2 diabetes mellitus with other circulatory complications: Secondary | ICD-10-CM | POA: Diagnosis not present

## 2021-04-06 DIAGNOSIS — Z950 Presence of cardiac pacemaker: Secondary | ICD-10-CM | POA: Diagnosis not present

## 2021-04-06 DIAGNOSIS — M6281 Muscle weakness (generalized): Secondary | ICD-10-CM | POA: Diagnosis not present

## 2021-04-06 DIAGNOSIS — Z7901 Long term (current) use of anticoagulants: Secondary | ICD-10-CM | POA: Diagnosis not present

## 2021-04-09 DIAGNOSIS — M6259 Muscle wasting and atrophy, not elsewhere classified, multiple sites: Secondary | ICD-10-CM | POA: Diagnosis not present

## 2021-04-09 DIAGNOSIS — I10 Essential (primary) hypertension: Secondary | ICD-10-CM | POA: Diagnosis not present

## 2021-04-10 DIAGNOSIS — M6259 Muscle wasting and atrophy, not elsewhere classified, multiple sites: Secondary | ICD-10-CM | POA: Diagnosis not present

## 2021-04-10 DIAGNOSIS — M792 Neuralgia and neuritis, unspecified: Secondary | ICD-10-CM | POA: Diagnosis not present

## 2021-04-11 DIAGNOSIS — E1159 Type 2 diabetes mellitus with other circulatory complications: Secondary | ICD-10-CM | POA: Diagnosis not present

## 2021-04-11 DIAGNOSIS — I129 Hypertensive chronic kidney disease with stage 1 through stage 4 chronic kidney disease, or unspecified chronic kidney disease: Secondary | ICD-10-CM | POA: Diagnosis not present

## 2021-04-11 DIAGNOSIS — I5032 Chronic diastolic (congestive) heart failure: Secondary | ICD-10-CM | POA: Diagnosis not present

## 2021-04-11 DIAGNOSIS — R54 Age-related physical debility: Secondary | ICD-10-CM | POA: Diagnosis not present

## 2021-04-11 DIAGNOSIS — M6281 Muscle weakness (generalized): Secondary | ICD-10-CM | POA: Diagnosis not present

## 2021-04-11 DIAGNOSIS — I110 Hypertensive heart disease with heart failure: Secondary | ICD-10-CM | POA: Diagnosis not present

## 2021-04-11 DIAGNOSIS — I739 Peripheral vascular disease, unspecified: Secondary | ICD-10-CM | POA: Diagnosis not present

## 2021-04-11 DIAGNOSIS — I480 Paroxysmal atrial fibrillation: Secondary | ICD-10-CM | POA: Diagnosis not present

## 2021-04-11 DIAGNOSIS — I872 Venous insufficiency (chronic) (peripheral): Secondary | ICD-10-CM | POA: Diagnosis not present

## 2021-04-11 DIAGNOSIS — I2510 Atherosclerotic heart disease of native coronary artery without angina pectoris: Secondary | ICD-10-CM | POA: Diagnosis not present

## 2021-04-11 DIAGNOSIS — Z7901 Long term (current) use of anticoagulants: Secondary | ICD-10-CM | POA: Diagnosis not present

## 2021-04-11 DIAGNOSIS — M6259 Muscle wasting and atrophy, not elsewhere classified, multiple sites: Secondary | ICD-10-CM | POA: Diagnosis not present

## 2021-04-11 DIAGNOSIS — Z950 Presence of cardiac pacemaker: Secondary | ICD-10-CM | POA: Diagnosis not present

## 2021-04-12 DIAGNOSIS — M6259 Muscle wasting and atrophy, not elsewhere classified, multiple sites: Secondary | ICD-10-CM | POA: Diagnosis not present

## 2021-04-13 DIAGNOSIS — I872 Venous insufficiency (chronic) (peripheral): Secondary | ICD-10-CM | POA: Diagnosis not present

## 2021-04-13 DIAGNOSIS — Z950 Presence of cardiac pacemaker: Secondary | ICD-10-CM | POA: Diagnosis not present

## 2021-04-13 DIAGNOSIS — I2510 Atherosclerotic heart disease of native coronary artery without angina pectoris: Secondary | ICD-10-CM | POA: Diagnosis not present

## 2021-04-13 DIAGNOSIS — I739 Peripheral vascular disease, unspecified: Secondary | ICD-10-CM | POA: Diagnosis not present

## 2021-04-13 DIAGNOSIS — I110 Hypertensive heart disease with heart failure: Secondary | ICD-10-CM | POA: Diagnosis not present

## 2021-04-13 DIAGNOSIS — I5032 Chronic diastolic (congestive) heart failure: Secondary | ICD-10-CM | POA: Diagnosis not present

## 2021-04-13 DIAGNOSIS — E1159 Type 2 diabetes mellitus with other circulatory complications: Secondary | ICD-10-CM | POA: Diagnosis not present

## 2021-04-13 DIAGNOSIS — R54 Age-related physical debility: Secondary | ICD-10-CM | POA: Diagnosis not present

## 2021-04-13 DIAGNOSIS — I129 Hypertensive chronic kidney disease with stage 1 through stage 4 chronic kidney disease, or unspecified chronic kidney disease: Secondary | ICD-10-CM | POA: Diagnosis not present

## 2021-04-13 DIAGNOSIS — I480 Paroxysmal atrial fibrillation: Secondary | ICD-10-CM | POA: Diagnosis not present

## 2021-04-13 DIAGNOSIS — Z7901 Long term (current) use of anticoagulants: Secondary | ICD-10-CM | POA: Diagnosis not present

## 2021-04-13 DIAGNOSIS — M6281 Muscle weakness (generalized): Secondary | ICD-10-CM | POA: Diagnosis not present

## 2021-04-13 DIAGNOSIS — G3184 Mild cognitive impairment, so stated: Secondary | ICD-10-CM | POA: Diagnosis not present

## 2021-04-13 DIAGNOSIS — M6259 Muscle wasting and atrophy, not elsewhere classified, multiple sites: Secondary | ICD-10-CM | POA: Diagnosis not present

## 2021-04-14 DIAGNOSIS — I872 Venous insufficiency (chronic) (peripheral): Secondary | ICD-10-CM | POA: Diagnosis not present

## 2021-04-14 DIAGNOSIS — I110 Hypertensive heart disease with heart failure: Secondary | ICD-10-CM | POA: Diagnosis not present

## 2021-04-14 DIAGNOSIS — I3139 Other pericardial effusion (noninflammatory): Secondary | ICD-10-CM | POA: Diagnosis not present

## 2021-04-14 DIAGNOSIS — G4730 Sleep apnea, unspecified: Secondary | ICD-10-CM | POA: Diagnosis not present

## 2021-04-14 DIAGNOSIS — I5032 Chronic diastolic (congestive) heart failure: Secondary | ICD-10-CM | POA: Diagnosis not present

## 2021-04-14 DIAGNOSIS — I482 Chronic atrial fibrillation, unspecified: Secondary | ICD-10-CM | POA: Diagnosis not present

## 2021-04-14 DIAGNOSIS — E119 Type 2 diabetes mellitus without complications: Secondary | ICD-10-CM | POA: Diagnosis not present

## 2021-04-14 DIAGNOSIS — R072 Precordial pain: Secondary | ICD-10-CM | POA: Diagnosis not present

## 2021-04-14 DIAGNOSIS — Z950 Presence of cardiac pacemaker: Secondary | ICD-10-CM | POA: Diagnosis not present

## 2021-04-14 DIAGNOSIS — B0229 Other postherpetic nervous system involvement: Secondary | ICD-10-CM | POA: Diagnosis not present

## 2021-04-14 DIAGNOSIS — Z7901 Long term (current) use of anticoagulants: Secondary | ICD-10-CM | POA: Diagnosis not present

## 2021-04-14 DIAGNOSIS — Z96659 Presence of unspecified artificial knee joint: Secondary | ICD-10-CM | POA: Diagnosis not present

## 2021-04-14 DIAGNOSIS — R0789 Other chest pain: Secondary | ICD-10-CM | POA: Diagnosis not present

## 2021-04-14 DIAGNOSIS — K6389 Other specified diseases of intestine: Secondary | ICD-10-CM | POA: Diagnosis not present

## 2021-04-14 DIAGNOSIS — M810 Age-related osteoporosis without current pathological fracture: Secondary | ICD-10-CM | POA: Diagnosis not present

## 2021-04-14 DIAGNOSIS — E785 Hyperlipidemia, unspecified: Secondary | ICD-10-CM | POA: Diagnosis not present

## 2021-04-14 DIAGNOSIS — Z8619 Personal history of other infectious and parasitic diseases: Secondary | ICD-10-CM | POA: Diagnosis not present

## 2021-04-14 DIAGNOSIS — R5383 Other fatigue: Secondary | ICD-10-CM | POA: Diagnosis not present

## 2021-04-14 DIAGNOSIS — E46 Unspecified protein-calorie malnutrition: Secondary | ICD-10-CM | POA: Diagnosis not present

## 2021-04-14 DIAGNOSIS — I2510 Atherosclerotic heart disease of native coronary artery without angina pectoris: Secondary | ICD-10-CM | POA: Diagnosis not present

## 2021-04-14 DIAGNOSIS — Z886 Allergy status to analgesic agent status: Secondary | ICD-10-CM | POA: Diagnosis not present

## 2021-04-14 DIAGNOSIS — E876 Hypokalemia: Secondary | ICD-10-CM | POA: Diagnosis not present

## 2021-04-14 DIAGNOSIS — K626 Ulcer of anus and rectum: Secondary | ICD-10-CM | POA: Diagnosis not present

## 2021-04-14 DIAGNOSIS — E878 Other disorders of electrolyte and fluid balance, not elsewhere classified: Secondary | ICD-10-CM | POA: Diagnosis not present

## 2021-04-14 DIAGNOSIS — K5900 Constipation, unspecified: Secondary | ICD-10-CM | POA: Diagnosis not present

## 2021-04-14 DIAGNOSIS — R413 Other amnesia: Secondary | ICD-10-CM | POA: Diagnosis not present

## 2021-04-14 DIAGNOSIS — R079 Chest pain, unspecified: Secondary | ICD-10-CM | POA: Diagnosis not present

## 2021-04-14 DIAGNOSIS — K5289 Other specified noninfective gastroenteritis and colitis: Secondary | ICD-10-CM | POA: Diagnosis not present

## 2021-04-14 DIAGNOSIS — R1011 Right upper quadrant pain: Secondary | ICD-10-CM | POA: Diagnosis not present

## 2021-04-14 DIAGNOSIS — M069 Rheumatoid arthritis, unspecified: Secondary | ICD-10-CM | POA: Diagnosis not present

## 2021-04-15 DIAGNOSIS — I5032 Chronic diastolic (congestive) heart failure: Secondary | ICD-10-CM | POA: Diagnosis not present

## 2021-04-15 DIAGNOSIS — Z7901 Long term (current) use of anticoagulants: Secondary | ICD-10-CM | POA: Diagnosis not present

## 2021-04-15 DIAGNOSIS — E878 Other disorders of electrolyte and fluid balance, not elsewhere classified: Secondary | ICD-10-CM | POA: Diagnosis not present

## 2021-04-15 DIAGNOSIS — I482 Chronic atrial fibrillation, unspecified: Secondary | ICD-10-CM | POA: Diagnosis not present

## 2021-04-15 DIAGNOSIS — K5289 Other specified noninfective gastroenteritis and colitis: Secondary | ICD-10-CM | POA: Diagnosis not present

## 2021-04-15 DIAGNOSIS — Z8619 Personal history of other infectious and parasitic diseases: Secondary | ICD-10-CM | POA: Diagnosis not present

## 2021-04-16 DIAGNOSIS — E878 Other disorders of electrolyte and fluid balance, not elsewhere classified: Secondary | ICD-10-CM | POA: Diagnosis not present

## 2021-04-16 DIAGNOSIS — Z515 Encounter for palliative care: Secondary | ICD-10-CM | POA: Diagnosis not present

## 2021-04-16 DIAGNOSIS — I5032 Chronic diastolic (congestive) heart failure: Secondary | ICD-10-CM | POA: Diagnosis not present

## 2021-04-16 DIAGNOSIS — R413 Other amnesia: Secondary | ICD-10-CM | POA: Diagnosis not present

## 2021-04-16 DIAGNOSIS — Z8619 Personal history of other infectious and parasitic diseases: Secondary | ICD-10-CM | POA: Diagnosis not present

## 2021-04-16 DIAGNOSIS — K5289 Other specified noninfective gastroenteritis and colitis: Secondary | ICD-10-CM | POA: Diagnosis not present

## 2021-04-16 DIAGNOSIS — Z7901 Long term (current) use of anticoagulants: Secondary | ICD-10-CM | POA: Diagnosis not present

## 2021-04-16 DIAGNOSIS — I482 Chronic atrial fibrillation, unspecified: Secondary | ICD-10-CM | POA: Diagnosis not present

## 2021-04-16 DIAGNOSIS — Z7189 Other specified counseling: Secondary | ICD-10-CM | POA: Diagnosis not present

## 2021-04-17 DIAGNOSIS — M6259 Muscle wasting and atrophy, not elsewhere classified, multiple sites: Secondary | ICD-10-CM | POA: Diagnosis not present

## 2021-04-18 DIAGNOSIS — M6259 Muscle wasting and atrophy, not elsewhere classified, multiple sites: Secondary | ICD-10-CM | POA: Diagnosis not present

## 2021-04-18 DIAGNOSIS — R079 Chest pain, unspecified: Secondary | ICD-10-CM | POA: Diagnosis not present

## 2021-04-18 DIAGNOSIS — E876 Hypokalemia: Secondary | ICD-10-CM | POA: Diagnosis not present

## 2021-04-18 DIAGNOSIS — K5904 Chronic idiopathic constipation: Secondary | ICD-10-CM | POA: Diagnosis not present

## 2021-04-18 DIAGNOSIS — Z950 Presence of cardiac pacemaker: Secondary | ICD-10-CM | POA: Diagnosis not present

## 2021-04-18 DIAGNOSIS — M792 Neuralgia and neuritis, unspecified: Secondary | ICD-10-CM | POA: Diagnosis not present

## 2021-04-18 DIAGNOSIS — R54 Age-related physical debility: Secondary | ICD-10-CM | POA: Diagnosis not present

## 2021-04-18 DIAGNOSIS — K529 Noninfective gastroenteritis and colitis, unspecified: Secondary | ICD-10-CM | POA: Diagnosis not present

## 2021-04-18 DIAGNOSIS — E1140 Type 2 diabetes mellitus with diabetic neuropathy, unspecified: Secondary | ICD-10-CM | POA: Diagnosis not present

## 2021-04-18 DIAGNOSIS — M6281 Muscle weakness (generalized): Secondary | ICD-10-CM | POA: Diagnosis not present

## 2021-04-18 DIAGNOSIS — I480 Paroxysmal atrial fibrillation: Secondary | ICD-10-CM | POA: Diagnosis not present

## 2021-04-18 DIAGNOSIS — B0229 Other postherpetic nervous system involvement: Secondary | ICD-10-CM | POA: Diagnosis not present

## 2021-04-19 DIAGNOSIS — M6259 Muscle wasting and atrophy, not elsewhere classified, multiple sites: Secondary | ICD-10-CM | POA: Diagnosis not present

## 2021-04-20 DIAGNOSIS — R54 Age-related physical debility: Secondary | ICD-10-CM | POA: Diagnosis not present

## 2021-04-20 DIAGNOSIS — M6259 Muscle wasting and atrophy, not elsewhere classified, multiple sites: Secondary | ICD-10-CM | POA: Diagnosis not present

## 2021-04-20 DIAGNOSIS — E1140 Type 2 diabetes mellitus with diabetic neuropathy, unspecified: Secondary | ICD-10-CM | POA: Diagnosis not present

## 2021-04-20 DIAGNOSIS — M6281 Muscle weakness (generalized): Secondary | ICD-10-CM | POA: Diagnosis not present

## 2021-04-20 DIAGNOSIS — M792 Neuralgia and neuritis, unspecified: Secondary | ICD-10-CM | POA: Diagnosis not present

## 2021-04-20 DIAGNOSIS — K529 Noninfective gastroenteritis and colitis, unspecified: Secondary | ICD-10-CM | POA: Diagnosis not present

## 2021-04-20 DIAGNOSIS — R079 Chest pain, unspecified: Secondary | ICD-10-CM | POA: Diagnosis not present

## 2021-04-20 DIAGNOSIS — K5904 Chronic idiopathic constipation: Secondary | ICD-10-CM | POA: Diagnosis not present

## 2021-04-20 DIAGNOSIS — Z950 Presence of cardiac pacemaker: Secondary | ICD-10-CM | POA: Diagnosis not present

## 2021-04-20 DIAGNOSIS — E876 Hypokalemia: Secondary | ICD-10-CM | POA: Diagnosis not present

## 2021-04-20 DIAGNOSIS — B0229 Other postherpetic nervous system involvement: Secondary | ICD-10-CM | POA: Diagnosis not present

## 2021-04-20 DIAGNOSIS — I480 Paroxysmal atrial fibrillation: Secondary | ICD-10-CM | POA: Diagnosis not present

## 2021-04-21 DIAGNOSIS — M6259 Muscle wasting and atrophy, not elsewhere classified, multiple sites: Secondary | ICD-10-CM | POA: Diagnosis not present

## 2021-04-23 DIAGNOSIS — M6281 Muscle weakness (generalized): Secondary | ICD-10-CM | POA: Diagnosis not present

## 2021-04-23 DIAGNOSIS — I10 Essential (primary) hypertension: Secondary | ICD-10-CM | POA: Diagnosis not present

## 2021-04-23 DIAGNOSIS — R079 Chest pain, unspecified: Secondary | ICD-10-CM | POA: Diagnosis not present

## 2021-04-23 DIAGNOSIS — E876 Hypokalemia: Secondary | ICD-10-CM | POA: Diagnosis not present

## 2021-04-23 DIAGNOSIS — Z950 Presence of cardiac pacemaker: Secondary | ICD-10-CM | POA: Diagnosis not present

## 2021-04-23 DIAGNOSIS — R54 Age-related physical debility: Secondary | ICD-10-CM | POA: Diagnosis not present

## 2021-04-23 DIAGNOSIS — K529 Noninfective gastroenteritis and colitis, unspecified: Secondary | ICD-10-CM | POA: Diagnosis not present

## 2021-04-23 DIAGNOSIS — E1140 Type 2 diabetes mellitus with diabetic neuropathy, unspecified: Secondary | ICD-10-CM | POA: Diagnosis not present

## 2021-04-23 DIAGNOSIS — B0229 Other postherpetic nervous system involvement: Secondary | ICD-10-CM | POA: Diagnosis not present

## 2021-04-23 DIAGNOSIS — K5904 Chronic idiopathic constipation: Secondary | ICD-10-CM | POA: Diagnosis not present

## 2021-04-23 DIAGNOSIS — I480 Paroxysmal atrial fibrillation: Secondary | ICD-10-CM | POA: Diagnosis not present

## 2021-04-23 DIAGNOSIS — M792 Neuralgia and neuritis, unspecified: Secondary | ICD-10-CM | POA: Diagnosis not present

## 2021-04-24 DIAGNOSIS — M6259 Muscle wasting and atrophy, not elsewhere classified, multiple sites: Secondary | ICD-10-CM | POA: Diagnosis not present

## 2021-04-25 DIAGNOSIS — E876 Hypokalemia: Secondary | ICD-10-CM | POA: Diagnosis not present

## 2021-04-25 DIAGNOSIS — K529 Noninfective gastroenteritis and colitis, unspecified: Secondary | ICD-10-CM | POA: Diagnosis not present

## 2021-04-25 DIAGNOSIS — I480 Paroxysmal atrial fibrillation: Secondary | ICD-10-CM | POA: Diagnosis not present

## 2021-04-25 DIAGNOSIS — B0229 Other postherpetic nervous system involvement: Secondary | ICD-10-CM | POA: Diagnosis not present

## 2021-04-25 DIAGNOSIS — K5904 Chronic idiopathic constipation: Secondary | ICD-10-CM | POA: Diagnosis not present

## 2021-04-25 DIAGNOSIS — E1140 Type 2 diabetes mellitus with diabetic neuropathy, unspecified: Secondary | ICD-10-CM | POA: Diagnosis not present

## 2021-04-25 DIAGNOSIS — M6281 Muscle weakness (generalized): Secondary | ICD-10-CM | POA: Diagnosis not present

## 2021-04-25 DIAGNOSIS — Z950 Presence of cardiac pacemaker: Secondary | ICD-10-CM | POA: Diagnosis not present

## 2021-04-25 DIAGNOSIS — M792 Neuralgia and neuritis, unspecified: Secondary | ICD-10-CM | POA: Diagnosis not present

## 2021-04-25 DIAGNOSIS — R079 Chest pain, unspecified: Secondary | ICD-10-CM | POA: Diagnosis not present

## 2021-04-25 DIAGNOSIS — R54 Age-related physical debility: Secondary | ICD-10-CM | POA: Diagnosis not present

## 2021-04-25 DIAGNOSIS — M6259 Muscle wasting and atrophy, not elsewhere classified, multiple sites: Secondary | ICD-10-CM | POA: Diagnosis not present

## 2021-04-26 DIAGNOSIS — M6259 Muscle wasting and atrophy, not elsewhere classified, multiple sites: Secondary | ICD-10-CM | POA: Diagnosis not present

## 2021-04-27 DIAGNOSIS — M6259 Muscle wasting and atrophy, not elsewhere classified, multiple sites: Secondary | ICD-10-CM | POA: Diagnosis not present

## 2021-04-27 DIAGNOSIS — K5904 Chronic idiopathic constipation: Secondary | ICD-10-CM | POA: Diagnosis not present

## 2021-04-27 DIAGNOSIS — M6281 Muscle weakness (generalized): Secondary | ICD-10-CM | POA: Diagnosis not present

## 2021-04-27 DIAGNOSIS — K529 Noninfective gastroenteritis and colitis, unspecified: Secondary | ICD-10-CM | POA: Diagnosis not present

## 2021-04-27 DIAGNOSIS — R54 Age-related physical debility: Secondary | ICD-10-CM | POA: Diagnosis not present

## 2021-04-27 DIAGNOSIS — E876 Hypokalemia: Secondary | ICD-10-CM | POA: Diagnosis not present

## 2021-04-27 DIAGNOSIS — Z950 Presence of cardiac pacemaker: Secondary | ICD-10-CM | POA: Diagnosis not present

## 2021-04-27 DIAGNOSIS — Z7901 Long term (current) use of anticoagulants: Secondary | ICD-10-CM | POA: Diagnosis not present

## 2021-04-27 DIAGNOSIS — E1140 Type 2 diabetes mellitus with diabetic neuropathy, unspecified: Secondary | ICD-10-CM | POA: Diagnosis not present

## 2021-04-27 DIAGNOSIS — I480 Paroxysmal atrial fibrillation: Secondary | ICD-10-CM | POA: Diagnosis not present

## 2021-04-27 DIAGNOSIS — I5032 Chronic diastolic (congestive) heart failure: Secondary | ICD-10-CM | POA: Diagnosis not present

## 2021-04-27 DIAGNOSIS — B0229 Other postherpetic nervous system involvement: Secondary | ICD-10-CM | POA: Diagnosis not present

## 2021-04-27 DIAGNOSIS — M792 Neuralgia and neuritis, unspecified: Secondary | ICD-10-CM | POA: Diagnosis not present

## 2021-04-30 DIAGNOSIS — R54 Age-related physical debility: Secondary | ICD-10-CM | POA: Diagnosis not present

## 2021-04-30 DIAGNOSIS — B0229 Other postherpetic nervous system involvement: Secondary | ICD-10-CM | POA: Diagnosis not present

## 2021-04-30 DIAGNOSIS — I5032 Chronic diastolic (congestive) heart failure: Secondary | ICD-10-CM | POA: Diagnosis not present

## 2021-04-30 DIAGNOSIS — E876 Hypokalemia: Secondary | ICD-10-CM | POA: Diagnosis not present

## 2021-04-30 DIAGNOSIS — K529 Noninfective gastroenteritis and colitis, unspecified: Secondary | ICD-10-CM | POA: Diagnosis not present

## 2021-04-30 DIAGNOSIS — M6281 Muscle weakness (generalized): Secondary | ICD-10-CM | POA: Diagnosis not present

## 2021-04-30 DIAGNOSIS — K5904 Chronic idiopathic constipation: Secondary | ICD-10-CM | POA: Diagnosis not present

## 2021-04-30 DIAGNOSIS — I480 Paroxysmal atrial fibrillation: Secondary | ICD-10-CM | POA: Diagnosis not present

## 2021-04-30 DIAGNOSIS — Z950 Presence of cardiac pacemaker: Secondary | ICD-10-CM | POA: Diagnosis not present

## 2021-04-30 DIAGNOSIS — M792 Neuralgia and neuritis, unspecified: Secondary | ICD-10-CM | POA: Diagnosis not present

## 2021-04-30 DIAGNOSIS — Z7901 Long term (current) use of anticoagulants: Secondary | ICD-10-CM | POA: Diagnosis not present

## 2021-04-30 DIAGNOSIS — E1140 Type 2 diabetes mellitus with diabetic neuropathy, unspecified: Secondary | ICD-10-CM | POA: Diagnosis not present

## 2021-04-30 DIAGNOSIS — M6259 Muscle wasting and atrophy, not elsewhere classified, multiple sites: Secondary | ICD-10-CM | POA: Diagnosis not present

## 2021-05-01 DIAGNOSIS — M6259 Muscle wasting and atrophy, not elsewhere classified, multiple sites: Secondary | ICD-10-CM | POA: Diagnosis not present

## 2021-05-02 DIAGNOSIS — M6259 Muscle wasting and atrophy, not elsewhere classified, multiple sites: Secondary | ICD-10-CM | POA: Diagnosis not present

## 2021-05-02 DIAGNOSIS — I10 Essential (primary) hypertension: Secondary | ICD-10-CM | POA: Diagnosis not present

## 2021-05-02 DIAGNOSIS — E1022 Type 1 diabetes mellitus with diabetic chronic kidney disease: Secondary | ICD-10-CM | POA: Diagnosis not present

## 2021-05-02 DIAGNOSIS — I5032 Chronic diastolic (congestive) heart failure: Secondary | ICD-10-CM | POA: Diagnosis not present

## 2021-05-02 DIAGNOSIS — E785 Hyperlipidemia, unspecified: Secondary | ICD-10-CM | POA: Diagnosis not present

## 2021-05-03 DIAGNOSIS — M6259 Muscle wasting and atrophy, not elsewhere classified, multiple sites: Secondary | ICD-10-CM | POA: Diagnosis not present

## 2021-05-04 ENCOUNTER — Telehealth: Payer: Self-pay | Admitting: Internal Medicine

## 2021-05-04 DIAGNOSIS — M6259 Muscle wasting and atrophy, not elsewhere classified, multiple sites: Secondary | ICD-10-CM | POA: Diagnosis not present

## 2021-05-04 NOTE — Telephone Encounter (Signed)
Patient's daughter called saying her mother is now in a skilled nursing home in [place]. She is wondering if Dr. Ladona Ridgel can recommend her to another EP provider in [place].  ?

## 2021-05-06 NOTE — Telephone Encounter (Signed)
Dr. Bufford ButtnerMehta or one of his partners.  ?

## 2021-05-07 ENCOUNTER — Telehealth: Payer: Self-pay

## 2021-05-07 DIAGNOSIS — M6259 Muscle wasting and atrophy, not elsewhere classified, multiple sites: Secondary | ICD-10-CM | POA: Diagnosis not present

## 2021-05-07 NOTE — Telephone Encounter (Signed)
Called patient's daughter back and informed her of Dr. Lubertha Basque advisement. She verbalized understanding. ?

## 2021-05-07 NOTE — Telephone Encounter (Signed)
error 

## 2021-05-07 NOTE — Telephone Encounter (Signed)
The patient daughter states the patient is in a nursing home in [place]. She is going to call them to get them to transmit with the monitor to make sure it is working. She will call back before 4:30 pm to make sure we received the transmission. ?

## 2021-05-08 DIAGNOSIS — M6259 Muscle wasting and atrophy, not elsewhere classified, multiple sites: Secondary | ICD-10-CM | POA: Diagnosis not present

## 2021-05-09 DIAGNOSIS — M6259 Muscle wasting and atrophy, not elsewhere classified, multiple sites: Secondary | ICD-10-CM | POA: Diagnosis not present

## 2021-05-09 DIAGNOSIS — G4730 Sleep apnea, unspecified: Secondary | ICD-10-CM | POA: Diagnosis not present

## 2021-05-10 DIAGNOSIS — R079 Chest pain, unspecified: Secondary | ICD-10-CM | POA: Diagnosis not present

## 2021-05-10 DIAGNOSIS — G894 Chronic pain syndrome: Secondary | ICD-10-CM | POA: Diagnosis not present

## 2021-05-10 DIAGNOSIS — R54 Age-related physical debility: Secondary | ICD-10-CM | POA: Diagnosis not present

## 2021-05-10 DIAGNOSIS — G603 Idiopathic progressive neuropathy: Secondary | ICD-10-CM | POA: Diagnosis not present

## 2021-05-10 DIAGNOSIS — M792 Neuralgia and neuritis, unspecified: Secondary | ICD-10-CM | POA: Diagnosis not present

## 2021-05-10 DIAGNOSIS — I480 Paroxysmal atrial fibrillation: Secondary | ICD-10-CM | POA: Diagnosis not present

## 2021-05-10 DIAGNOSIS — Z515 Encounter for palliative care: Secondary | ICD-10-CM | POA: Diagnosis not present

## 2021-05-10 DIAGNOSIS — M6259 Muscle wasting and atrophy, not elsewhere classified, multiple sites: Secondary | ICD-10-CM | POA: Diagnosis not present

## 2021-05-11 DIAGNOSIS — G3184 Mild cognitive impairment, so stated: Secondary | ICD-10-CM | POA: Diagnosis not present

## 2021-05-11 DIAGNOSIS — M6259 Muscle wasting and atrophy, not elsewhere classified, multiple sites: Secondary | ICD-10-CM | POA: Diagnosis not present

## 2021-05-14 DIAGNOSIS — M6259 Muscle wasting and atrophy, not elsewhere classified, multiple sites: Secondary | ICD-10-CM | POA: Diagnosis not present

## 2021-05-15 DIAGNOSIS — M6259 Muscle wasting and atrophy, not elsewhere classified, multiple sites: Secondary | ICD-10-CM | POA: Diagnosis not present

## 2021-05-16 DIAGNOSIS — M6259 Muscle wasting and atrophy, not elsewhere classified, multiple sites: Secondary | ICD-10-CM | POA: Diagnosis not present

## 2021-05-17 DIAGNOSIS — M6259 Muscle wasting and atrophy, not elsewhere classified, multiple sites: Secondary | ICD-10-CM | POA: Diagnosis not present

## 2021-05-18 ENCOUNTER — Ambulatory Visit: Payer: Medicare Other

## 2021-05-18 DIAGNOSIS — M6259 Muscle wasting and atrophy, not elsewhere classified, multiple sites: Secondary | ICD-10-CM | POA: Diagnosis not present

## 2021-05-21 DIAGNOSIS — M6259 Muscle wasting and atrophy, not elsewhere classified, multiple sites: Secondary | ICD-10-CM | POA: Diagnosis not present

## 2021-05-22 DIAGNOSIS — M6259 Muscle wasting and atrophy, not elsewhere classified, multiple sites: Secondary | ICD-10-CM | POA: Diagnosis not present

## 2021-05-23 DIAGNOSIS — M6259 Muscle wasting and atrophy, not elsewhere classified, multiple sites: Secondary | ICD-10-CM | POA: Diagnosis not present

## 2021-05-23 DIAGNOSIS — G603 Idiopathic progressive neuropathy: Secondary | ICD-10-CM | POA: Diagnosis not present

## 2021-05-23 DIAGNOSIS — K529 Noninfective gastroenteritis and colitis, unspecified: Secondary | ICD-10-CM | POA: Diagnosis not present

## 2021-05-23 DIAGNOSIS — M792 Neuralgia and neuritis, unspecified: Secondary | ICD-10-CM | POA: Diagnosis not present

## 2021-05-23 DIAGNOSIS — E876 Hypokalemia: Secondary | ICD-10-CM | POA: Diagnosis not present

## 2021-05-24 DIAGNOSIS — M6259 Muscle wasting and atrophy, not elsewhere classified, multiple sites: Secondary | ICD-10-CM | POA: Diagnosis not present

## 2021-05-25 DIAGNOSIS — M6259 Muscle wasting and atrophy, not elsewhere classified, multiple sites: Secondary | ICD-10-CM | POA: Diagnosis not present

## 2021-05-28 DIAGNOSIS — M6259 Muscle wasting and atrophy, not elsewhere classified, multiple sites: Secondary | ICD-10-CM | POA: Diagnosis not present

## 2021-05-29 DIAGNOSIS — M6259 Muscle wasting and atrophy, not elsewhere classified, multiple sites: Secondary | ICD-10-CM | POA: Diagnosis not present

## 2021-05-30 DIAGNOSIS — L853 Xerosis cutis: Secondary | ICD-10-CM | POA: Diagnosis not present

## 2021-05-30 DIAGNOSIS — I739 Peripheral vascular disease, unspecified: Secondary | ICD-10-CM | POA: Diagnosis not present

## 2021-05-30 DIAGNOSIS — L602 Onychogryphosis: Secondary | ICD-10-CM | POA: Diagnosis not present

## 2021-05-30 DIAGNOSIS — E1159 Type 2 diabetes mellitus with other circulatory complications: Secondary | ICD-10-CM | POA: Diagnosis not present

## 2021-05-30 DIAGNOSIS — B351 Tinea unguium: Secondary | ICD-10-CM | POA: Diagnosis not present

## 2021-05-30 DIAGNOSIS — M6259 Muscle wasting and atrophy, not elsewhere classified, multiple sites: Secondary | ICD-10-CM | POA: Diagnosis not present

## 2021-05-31 DIAGNOSIS — M6259 Muscle wasting and atrophy, not elsewhere classified, multiple sites: Secondary | ICD-10-CM | POA: Diagnosis not present

## 2021-05-31 DIAGNOSIS — B0229 Other postherpetic nervous system involvement: Secondary | ICD-10-CM | POA: Diagnosis not present

## 2021-05-31 DIAGNOSIS — G894 Chronic pain syndrome: Secondary | ICD-10-CM | POA: Diagnosis not present

## 2021-05-31 DIAGNOSIS — I10 Essential (primary) hypertension: Secondary | ICD-10-CM | POA: Diagnosis not present

## 2021-06-06 DIAGNOSIS — G894 Chronic pain syndrome: Secondary | ICD-10-CM | POA: Diagnosis not present

## 2021-06-06 DIAGNOSIS — B0229 Other postherpetic nervous system involvement: Secondary | ICD-10-CM | POA: Diagnosis not present

## 2021-06-06 DIAGNOSIS — K5909 Other constipation: Secondary | ICD-10-CM | POA: Diagnosis not present

## 2021-06-18 DIAGNOSIS — I5032 Chronic diastolic (congestive) heart failure: Secondary | ICD-10-CM | POA: Diagnosis not present

## 2021-06-18 DIAGNOSIS — M6281 Muscle weakness (generalized): Secondary | ICD-10-CM | POA: Diagnosis not present

## 2021-06-18 DIAGNOSIS — I2510 Atherosclerotic heart disease of native coronary artery without angina pectoris: Secondary | ICD-10-CM | POA: Diagnosis not present

## 2021-06-18 DIAGNOSIS — R54 Age-related physical debility: Secondary | ICD-10-CM | POA: Diagnosis not present

## 2021-06-20 DIAGNOSIS — N1831 Chronic kidney disease, stage 3a: Secondary | ICD-10-CM | POA: Diagnosis not present

## 2021-06-20 DIAGNOSIS — I739 Peripheral vascular disease, unspecified: Secondary | ICD-10-CM | POA: Diagnosis not present

## 2021-06-20 DIAGNOSIS — I129 Hypertensive chronic kidney disease with stage 1 through stage 4 chronic kidney disease, or unspecified chronic kidney disease: Secondary | ICD-10-CM | POA: Diagnosis not present

## 2021-06-27 DIAGNOSIS — G894 Chronic pain syndrome: Secondary | ICD-10-CM | POA: Diagnosis not present

## 2021-06-27 DIAGNOSIS — M792 Neuralgia and neuritis, unspecified: Secondary | ICD-10-CM | POA: Diagnosis not present

## 2021-06-27 DIAGNOSIS — K5909 Other constipation: Secondary | ICD-10-CM | POA: Diagnosis not present

## 2021-06-28 DIAGNOSIS — I4891 Unspecified atrial fibrillation: Secondary | ICD-10-CM | POA: Diagnosis not present

## 2021-06-28 DIAGNOSIS — I442 Atrioventricular block, complete: Secondary | ICD-10-CM | POA: Diagnosis not present

## 2021-07-02 DIAGNOSIS — M6259 Muscle wasting and atrophy, not elsewhere classified, multiple sites: Secondary | ICD-10-CM | POA: Diagnosis not present

## 2021-07-11 DIAGNOSIS — I442 Atrioventricular block, complete: Secondary | ICD-10-CM | POA: Diagnosis not present

## 2021-07-17 DIAGNOSIS — I5032 Chronic diastolic (congestive) heart failure: Secondary | ICD-10-CM | POA: Diagnosis not present

## 2021-07-17 DIAGNOSIS — M6281 Muscle weakness (generalized): Secondary | ICD-10-CM | POA: Diagnosis not present

## 2021-07-17 DIAGNOSIS — I739 Peripheral vascular disease, unspecified: Secondary | ICD-10-CM | POA: Diagnosis not present

## 2021-07-19 DIAGNOSIS — I480 Paroxysmal atrial fibrillation: Secondary | ICD-10-CM | POA: Diagnosis not present

## 2021-07-19 DIAGNOSIS — Z515 Encounter for palliative care: Secondary | ICD-10-CM | POA: Diagnosis not present

## 2021-07-19 DIAGNOSIS — R54 Age-related physical debility: Secondary | ICD-10-CM | POA: Diagnosis not present

## 2021-07-19 DIAGNOSIS — R079 Chest pain, unspecified: Secondary | ICD-10-CM | POA: Diagnosis not present

## 2021-07-23 DIAGNOSIS — I480 Paroxysmal atrial fibrillation: Secondary | ICD-10-CM | POA: Diagnosis not present

## 2021-07-23 DIAGNOSIS — M792 Neuralgia and neuritis, unspecified: Secondary | ICD-10-CM | POA: Diagnosis not present

## 2021-07-23 DIAGNOSIS — G894 Chronic pain syndrome: Secondary | ICD-10-CM | POA: Diagnosis not present

## 2021-07-25 DIAGNOSIS — G894 Chronic pain syndrome: Secondary | ICD-10-CM | POA: Diagnosis not present

## 2021-07-25 DIAGNOSIS — M792 Neuralgia and neuritis, unspecified: Secondary | ICD-10-CM | POA: Diagnosis not present

## 2021-07-25 DIAGNOSIS — K5909 Other constipation: Secondary | ICD-10-CM | POA: Diagnosis not present

## 2021-08-01 DIAGNOSIS — B351 Tinea unguium: Secondary | ICD-10-CM | POA: Diagnosis not present

## 2021-08-01 DIAGNOSIS — I739 Peripheral vascular disease, unspecified: Secondary | ICD-10-CM | POA: Diagnosis not present

## 2021-08-01 DIAGNOSIS — E1159 Type 2 diabetes mellitus with other circulatory complications: Secondary | ICD-10-CM | POA: Diagnosis not present

## 2021-08-01 DIAGNOSIS — L602 Onychogryphosis: Secondary | ICD-10-CM | POA: Diagnosis not present

## 2021-08-20 DIAGNOSIS — F02B0 Dementia in other diseases classified elsewhere, moderate, without behavioral disturbance, psychotic disturbance, mood disturbance, and anxiety: Secondary | ICD-10-CM | POA: Diagnosis not present

## 2021-08-20 DIAGNOSIS — B0229 Other postherpetic nervous system involvement: Secondary | ICD-10-CM | POA: Diagnosis not present

## 2021-08-20 DIAGNOSIS — Z8619 Personal history of other infectious and parasitic diseases: Secondary | ICD-10-CM | POA: Diagnosis not present

## 2021-08-21 DIAGNOSIS — K5909 Other constipation: Secondary | ICD-10-CM | POA: Diagnosis not present

## 2021-08-21 DIAGNOSIS — I480 Paroxysmal atrial fibrillation: Secondary | ICD-10-CM | POA: Diagnosis not present

## 2021-08-21 DIAGNOSIS — M792 Neuralgia and neuritis, unspecified: Secondary | ICD-10-CM | POA: Diagnosis not present

## 2021-08-21 DIAGNOSIS — G894 Chronic pain syndrome: Secondary | ICD-10-CM | POA: Diagnosis not present

## 2021-08-23 DIAGNOSIS — Z515 Encounter for palliative care: Secondary | ICD-10-CM | POA: Diagnosis not present

## 2021-08-23 DIAGNOSIS — R54 Age-related physical debility: Secondary | ICD-10-CM | POA: Diagnosis not present

## 2021-08-23 DIAGNOSIS — I480 Paroxysmal atrial fibrillation: Secondary | ICD-10-CM | POA: Diagnosis not present

## 2021-08-31 DIAGNOSIS — M792 Neuralgia and neuritis, unspecified: Secondary | ICD-10-CM | POA: Diagnosis not present

## 2021-08-31 DIAGNOSIS — G894 Chronic pain syndrome: Secondary | ICD-10-CM | POA: Diagnosis not present

## 2021-08-31 DIAGNOSIS — K5909 Other constipation: Secondary | ICD-10-CM | POA: Diagnosis not present

## 2021-09-06 LAB — CUP PACEART REMOTE DEVICE CHECK
Battery Remaining Longevity: 94 mo
Battery Remaining Percentage: 79 %
Battery Voltage: 3.02 V
Brady Statistic RV Percent Paced: 97 %
Date Time Interrogation Session: 20230407020014
Implantable Lead Implant Date: 20090521
Implantable Lead Implant Date: 20090521
Implantable Lead Location: 753859
Implantable Lead Location: 753860
Implantable Pulse Generator Implant Date: 20201002
Lead Channel Impedance Value: 380 Ohm
Lead Channel Pacing Threshold Amplitude: 1.375 V
Lead Channel Pacing Threshold Pulse Width: 0.7 ms
Lead Channel Sensing Intrinsic Amplitude: 9.4 mV
Lead Channel Setting Pacing Amplitude: 1.625
Lead Channel Setting Pacing Pulse Width: 0.7 ms
Lead Channel Setting Sensing Sensitivity: 2 mV
Pulse Gen Model: 2272
Pulse Gen Serial Number: 9159208

## 2021-09-18 DIAGNOSIS — I2510 Atherosclerotic heart disease of native coronary artery without angina pectoris: Secondary | ICD-10-CM | POA: Diagnosis not present

## 2021-09-18 DIAGNOSIS — K219 Gastro-esophageal reflux disease without esophagitis: Secondary | ICD-10-CM | POA: Diagnosis not present

## 2021-09-18 DIAGNOSIS — I10 Essential (primary) hypertension: Secondary | ICD-10-CM | POA: Diagnosis not present

## 2021-09-26 DIAGNOSIS — K5909 Other constipation: Secondary | ICD-10-CM | POA: Diagnosis not present

## 2021-09-26 DIAGNOSIS — G894 Chronic pain syndrome: Secondary | ICD-10-CM | POA: Diagnosis not present

## 2021-09-26 DIAGNOSIS — M792 Neuralgia and neuritis, unspecified: Secondary | ICD-10-CM | POA: Diagnosis not present

## 2021-10-02 DIAGNOSIS — K219 Gastro-esophageal reflux disease without esophagitis: Secondary | ICD-10-CM | POA: Diagnosis not present

## 2021-10-02 DIAGNOSIS — I10 Essential (primary) hypertension: Secondary | ICD-10-CM | POA: Diagnosis not present

## 2021-10-02 DIAGNOSIS — R0789 Other chest pain: Secondary | ICD-10-CM | POA: Diagnosis not present

## 2021-10-03 DIAGNOSIS — M792 Neuralgia and neuritis, unspecified: Secondary | ICD-10-CM | POA: Diagnosis not present

## 2021-10-03 DIAGNOSIS — G894 Chronic pain syndrome: Secondary | ICD-10-CM | POA: Diagnosis not present

## 2021-10-03 DIAGNOSIS — K5909 Other constipation: Secondary | ICD-10-CM | POA: Diagnosis not present

## 2021-10-04 DIAGNOSIS — B351 Tinea unguium: Secondary | ICD-10-CM | POA: Diagnosis not present

## 2021-10-04 DIAGNOSIS — E1159 Type 2 diabetes mellitus with other circulatory complications: Secondary | ICD-10-CM | POA: Diagnosis not present

## 2021-10-04 DIAGNOSIS — I739 Peripheral vascular disease, unspecified: Secondary | ICD-10-CM | POA: Diagnosis not present

## 2021-10-04 DIAGNOSIS — L602 Onychogryphosis: Secondary | ICD-10-CM | POA: Diagnosis not present

## 2021-10-04 DIAGNOSIS — L988 Other specified disorders of the skin and subcutaneous tissue: Secondary | ICD-10-CM | POA: Diagnosis not present

## 2021-10-17 DIAGNOSIS — J4520 Mild intermittent asthma, uncomplicated: Secondary | ICD-10-CM | POA: Diagnosis not present

## 2021-10-17 DIAGNOSIS — I5032 Chronic diastolic (congestive) heart failure: Secondary | ICD-10-CM | POA: Diagnosis not present

## 2021-10-17 DIAGNOSIS — K219 Gastro-esophageal reflux disease without esophagitis: Secondary | ICD-10-CM | POA: Diagnosis not present

## 2021-10-29 DIAGNOSIS — G603 Idiopathic progressive neuropathy: Secondary | ICD-10-CM | POA: Diagnosis not present

## 2021-10-29 DIAGNOSIS — K5909 Other constipation: Secondary | ICD-10-CM | POA: Diagnosis not present

## 2021-10-29 DIAGNOSIS — G894 Chronic pain syndrome: Secondary | ICD-10-CM | POA: Diagnosis not present

## 2021-11-04 DIAGNOSIS — M6259 Muscle wasting and atrophy, not elsewhere classified, multiple sites: Secondary | ICD-10-CM | POA: Diagnosis not present

## 2021-11-05 DIAGNOSIS — M6259 Muscle wasting and atrophy, not elsewhere classified, multiple sites: Secondary | ICD-10-CM | POA: Diagnosis not present

## 2021-11-06 DIAGNOSIS — M6259 Muscle wasting and atrophy, not elsewhere classified, multiple sites: Secondary | ICD-10-CM | POA: Diagnosis not present

## 2021-11-07 DIAGNOSIS — M6259 Muscle wasting and atrophy, not elsewhere classified, multiple sites: Secondary | ICD-10-CM | POA: Diagnosis not present

## 2021-11-08 DIAGNOSIS — M6259 Muscle wasting and atrophy, not elsewhere classified, multiple sites: Secondary | ICD-10-CM | POA: Diagnosis not present

## 2021-11-09 DIAGNOSIS — I480 Paroxysmal atrial fibrillation: Secondary | ICD-10-CM | POA: Diagnosis not present

## 2021-11-09 DIAGNOSIS — N1831 Chronic kidney disease, stage 3a: Secondary | ICD-10-CM | POA: Diagnosis not present

## 2021-11-09 DIAGNOSIS — L304 Erythema intertrigo: Secondary | ICD-10-CM | POA: Diagnosis not present

## 2021-11-11 DIAGNOSIS — M6259 Muscle wasting and atrophy, not elsewhere classified, multiple sites: Secondary | ICD-10-CM | POA: Diagnosis not present

## 2021-11-12 DIAGNOSIS — Z950 Presence of cardiac pacemaker: Secondary | ICD-10-CM | POA: Diagnosis not present

## 2021-11-16 ENCOUNTER — Ambulatory Visit (INDEPENDENT_AMBULATORY_CARE_PROVIDER_SITE_OTHER): Payer: Medicare Other

## 2021-11-16 DIAGNOSIS — I495 Sick sinus syndrome: Secondary | ICD-10-CM | POA: Diagnosis not present

## 2021-11-16 DIAGNOSIS — M6259 Muscle wasting and atrophy, not elsewhere classified, multiple sites: Secondary | ICD-10-CM | POA: Diagnosis not present

## 2021-11-16 LAB — CUP PACEART REMOTE DEVICE CHECK
Battery Remaining Longevity: 89 mo
Battery Remaining Percentage: 75 %
Battery Voltage: 3.01 V
Brady Statistic RV Percent Paced: 98 %
Date Time Interrogation Session: 20231006020012
Implantable Lead Implant Date: 20090521
Implantable Lead Implant Date: 20090521
Implantable Lead Location: 753859
Implantable Lead Location: 753860
Implantable Pulse Generator Implant Date: 20201002
Lead Channel Impedance Value: 380 Ohm
Lead Channel Pacing Threshold Amplitude: 1.125 V
Lead Channel Pacing Threshold Pulse Width: 0.7 ms
Lead Channel Sensing Intrinsic Amplitude: 9.1 mV
Lead Channel Setting Pacing Amplitude: 1.375
Lead Channel Setting Pacing Pulse Width: 0.7 ms
Lead Channel Setting Sensing Sensitivity: 2 mV
Pulse Gen Model: 2272
Pulse Gen Serial Number: 9159208

## 2021-11-19 DIAGNOSIS — M6259 Muscle wasting and atrophy, not elsewhere classified, multiple sites: Secondary | ICD-10-CM | POA: Diagnosis not present

## 2021-11-20 DIAGNOSIS — K5909 Other constipation: Secondary | ICD-10-CM | POA: Diagnosis not present

## 2021-11-20 DIAGNOSIS — M6259 Muscle wasting and atrophy, not elsewhere classified, multiple sites: Secondary | ICD-10-CM | POA: Diagnosis not present

## 2021-11-20 DIAGNOSIS — G603 Idiopathic progressive neuropathy: Secondary | ICD-10-CM | POA: Diagnosis not present

## 2021-11-20 DIAGNOSIS — G894 Chronic pain syndrome: Secondary | ICD-10-CM | POA: Diagnosis not present

## 2021-11-20 NOTE — Progress Notes (Signed)
Remote pacemaker transmission.   

## 2021-11-21 DIAGNOSIS — M6259 Muscle wasting and atrophy, not elsewhere classified, multiple sites: Secondary | ICD-10-CM | POA: Diagnosis not present

## 2021-11-22 DIAGNOSIS — M6259 Muscle wasting and atrophy, not elsewhere classified, multiple sites: Secondary | ICD-10-CM | POA: Diagnosis not present

## 2021-11-23 DIAGNOSIS — M6259 Muscle wasting and atrophy, not elsewhere classified, multiple sites: Secondary | ICD-10-CM | POA: Diagnosis not present

## 2021-11-26 DIAGNOSIS — I5032 Chronic diastolic (congestive) heart failure: Secondary | ICD-10-CM | POA: Diagnosis not present

## 2021-11-26 DIAGNOSIS — I2510 Atherosclerotic heart disease of native coronary artery without angina pectoris: Secondary | ICD-10-CM | POA: Diagnosis not present

## 2021-11-26 DIAGNOSIS — K219 Gastro-esophageal reflux disease without esophagitis: Secondary | ICD-10-CM | POA: Diagnosis not present

## 2021-11-26 DIAGNOSIS — M6259 Muscle wasting and atrophy, not elsewhere classified, multiple sites: Secondary | ICD-10-CM | POA: Diagnosis not present

## 2021-11-27 DIAGNOSIS — M6259 Muscle wasting and atrophy, not elsewhere classified, multiple sites: Secondary | ICD-10-CM | POA: Diagnosis not present

## 2021-11-28 DIAGNOSIS — M6259 Muscle wasting and atrophy, not elsewhere classified, multiple sites: Secondary | ICD-10-CM | POA: Diagnosis not present

## 2021-11-29 DIAGNOSIS — M6259 Muscle wasting and atrophy, not elsewhere classified, multiple sites: Secondary | ICD-10-CM | POA: Diagnosis not present

## 2021-11-30 DIAGNOSIS — M6259 Muscle wasting and atrophy, not elsewhere classified, multiple sites: Secondary | ICD-10-CM | POA: Diagnosis not present

## 2021-12-03 DIAGNOSIS — M6259 Muscle wasting and atrophy, not elsewhere classified, multiple sites: Secondary | ICD-10-CM | POA: Diagnosis not present

## 2021-12-03 DIAGNOSIS — E1159 Type 2 diabetes mellitus with other circulatory complications: Secondary | ICD-10-CM | POA: Diagnosis not present

## 2021-12-03 DIAGNOSIS — K5909 Other constipation: Secondary | ICD-10-CM | POA: Diagnosis not present

## 2021-12-03 DIAGNOSIS — I739 Peripheral vascular disease, unspecified: Secondary | ICD-10-CM | POA: Diagnosis not present

## 2021-12-04 DIAGNOSIS — M6259 Muscle wasting and atrophy, not elsewhere classified, multiple sites: Secondary | ICD-10-CM | POA: Diagnosis not present

## 2021-12-05 DIAGNOSIS — I10 Essential (primary) hypertension: Secondary | ICD-10-CM | POA: Diagnosis not present

## 2021-12-05 DIAGNOSIS — M6259 Muscle wasting and atrophy, not elsewhere classified, multiple sites: Secondary | ICD-10-CM | POA: Diagnosis not present

## 2021-12-06 DIAGNOSIS — M6259 Muscle wasting and atrophy, not elsewhere classified, multiple sites: Secondary | ICD-10-CM | POA: Diagnosis not present

## 2021-12-07 DIAGNOSIS — M6259 Muscle wasting and atrophy, not elsewhere classified, multiple sites: Secondary | ICD-10-CM | POA: Diagnosis not present

## 2021-12-10 DIAGNOSIS — M6259 Muscle wasting and atrophy, not elsewhere classified, multiple sites: Secondary | ICD-10-CM | POA: Diagnosis not present

## 2021-12-11 DIAGNOSIS — M6259 Muscle wasting and atrophy, not elsewhere classified, multiple sites: Secondary | ICD-10-CM | POA: Diagnosis not present

## 2021-12-12 DIAGNOSIS — M6259 Muscle wasting and atrophy, not elsewhere classified, multiple sites: Secondary | ICD-10-CM | POA: Diagnosis not present

## 2021-12-13 DIAGNOSIS — M6259 Muscle wasting and atrophy, not elsewhere classified, multiple sites: Secondary | ICD-10-CM | POA: Diagnosis not present

## 2021-12-14 DIAGNOSIS — M6259 Muscle wasting and atrophy, not elsewhere classified, multiple sites: Secondary | ICD-10-CM | POA: Diagnosis not present

## 2021-12-20 DIAGNOSIS — G894 Chronic pain syndrome: Secondary | ICD-10-CM | POA: Diagnosis not present

## 2021-12-20 DIAGNOSIS — K5909 Other constipation: Secondary | ICD-10-CM | POA: Diagnosis not present

## 2022-01-01 DIAGNOSIS — I2510 Atherosclerotic heart disease of native coronary artery without angina pectoris: Secondary | ICD-10-CM | POA: Diagnosis not present

## 2022-01-01 DIAGNOSIS — E876 Hypokalemia: Secondary | ICD-10-CM | POA: Diagnosis not present

## 2022-01-01 DIAGNOSIS — I110 Hypertensive heart disease with heart failure: Secondary | ICD-10-CM | POA: Diagnosis not present

## 2022-01-04 DIAGNOSIS — J4520 Mild intermittent asthma, uncomplicated: Secondary | ICD-10-CM | POA: Diagnosis not present

## 2022-02-01 ENCOUNTER — Telehealth (HOSPITAL_COMMUNITY): Payer: Self-pay | Admitting: Cardiology

## 2022-02-05 NOTE — Telephone Encounter (Signed)
LMOVM that I have released the patient in [place].

## 2022-02-05 NOTE — Telephone Encounter (Signed)
Dana with [facility] called to get pt transferred for device monitoring ([place]) Please call [phone] with questions

## 2022-03-14 DEATH — deceased

## 2023-04-05 IMAGING — CT CT HEAD W/O CM
3 series · 15 of 47 positions shown, 18 images · non-contrast
Comparison: None.

CLINICAL DATA: Mental status change.

EXAM:
CT HEAD WITHOUT CONTRAST
TECHNIQUE: Contiguous axial images were obtained from the base of the skull
through the vertex without intravenous contrast.

[Series 2: head wo · axial · 0.41mm/px · z∈[-527,-392]mm · 9 of 33 slices shown, 12 images]
[im 3/33  brain]
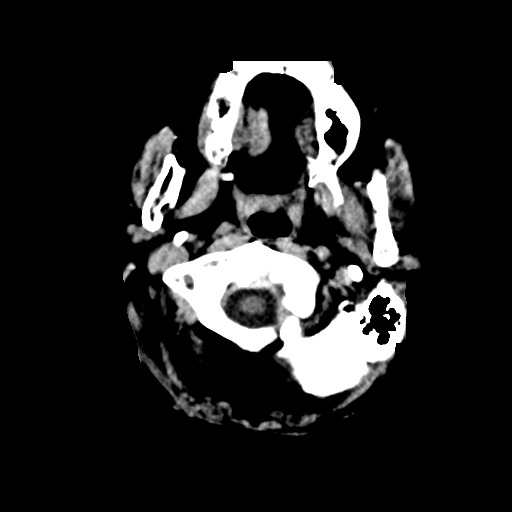
[im 3/33  bone]
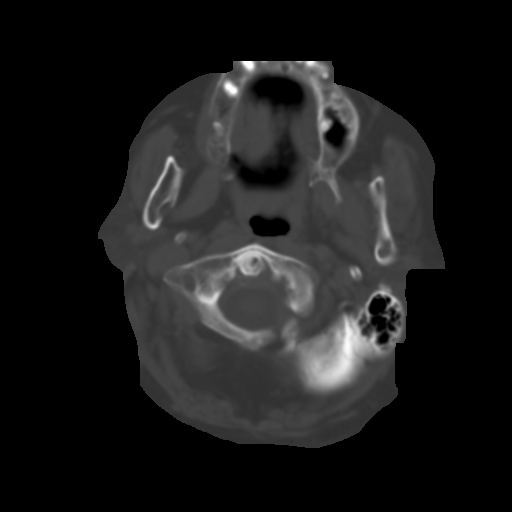
[im 6/33  brain]
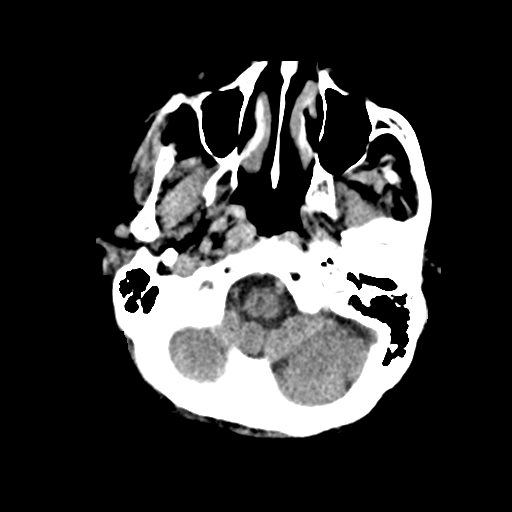
[im 9/33  brain]
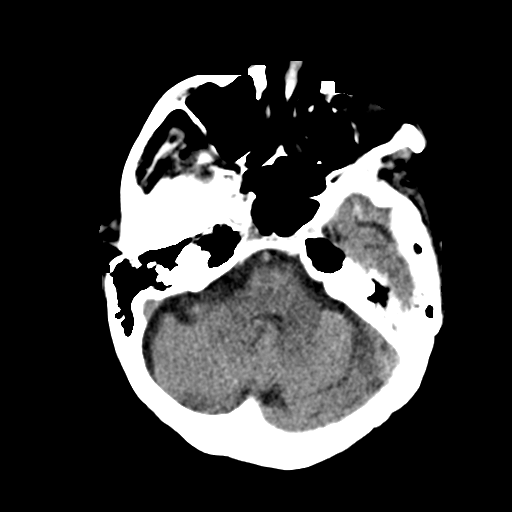
[im 13/33  brain]
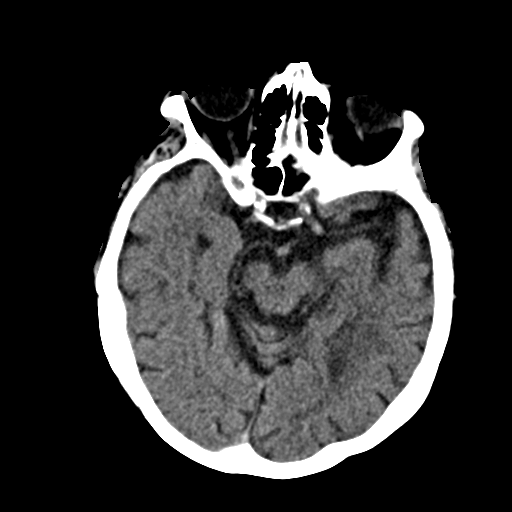
[im 17/33  brain]
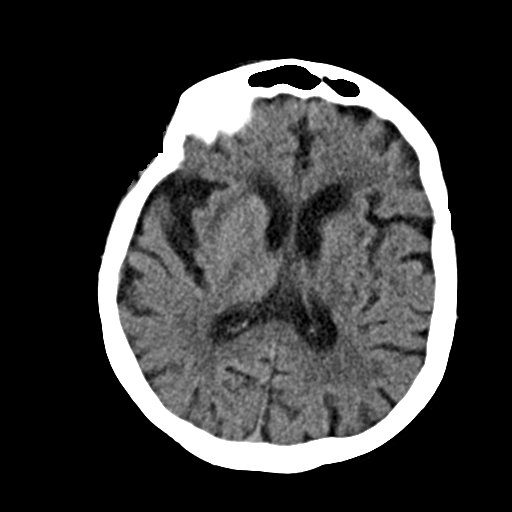
[im 17/33  bone]
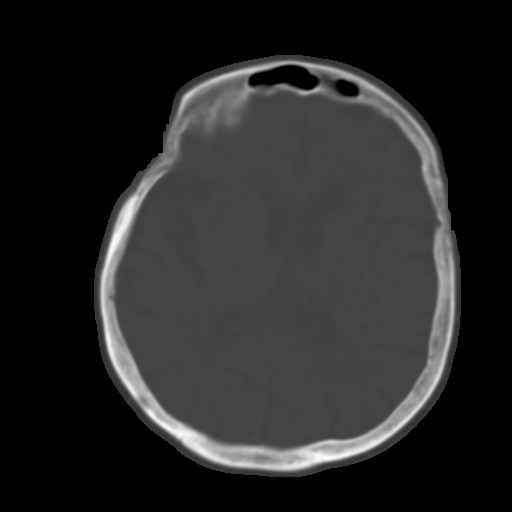
[im 20/33  brain]
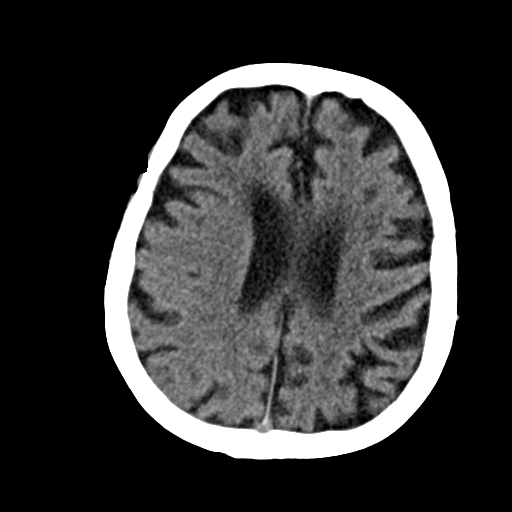
[im 24/33  brain]
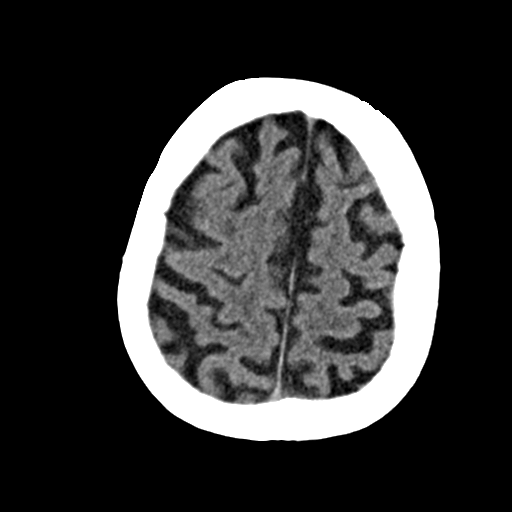
[im 27/33  brain]
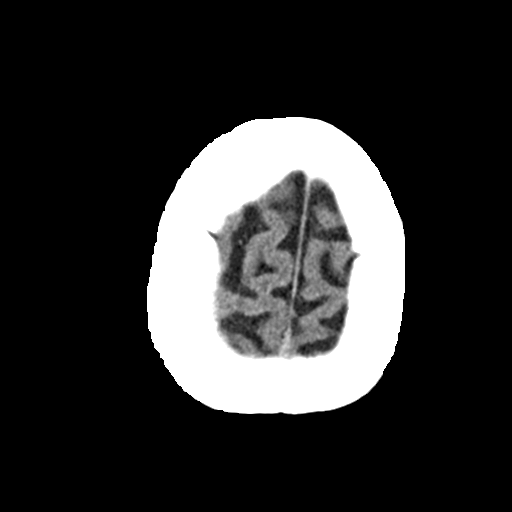
[im 30/33  brain]
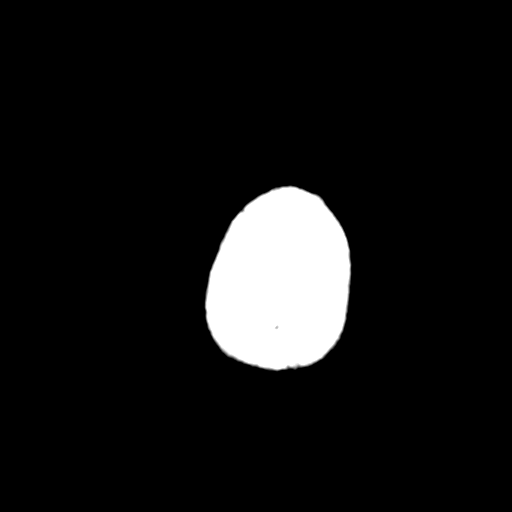
[im 30/33  bone]
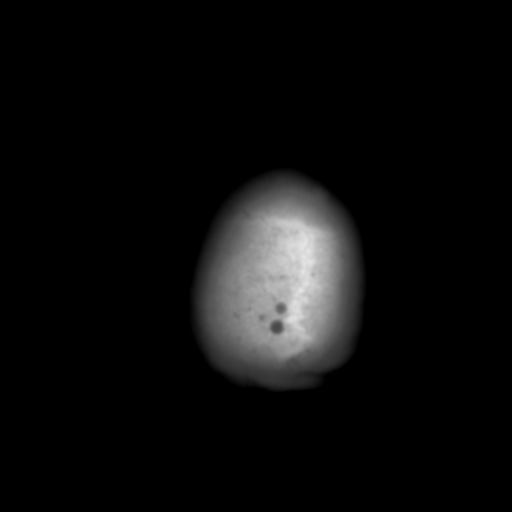

[Series 4: coronal soft · coronal · 0.31mm/px · 3 of 69 slices shown]
[im 23/69  brain]
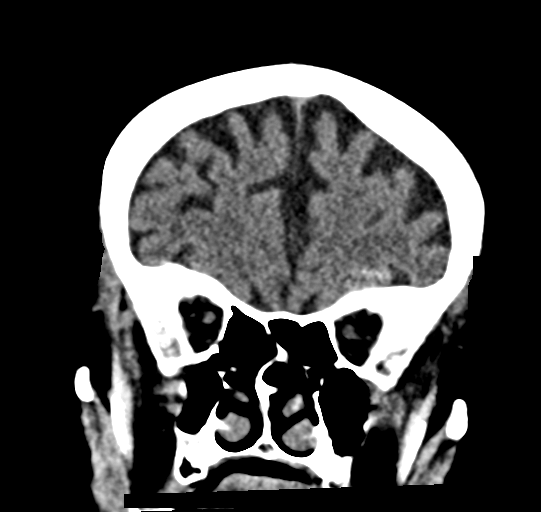
[im 31/69  brain]
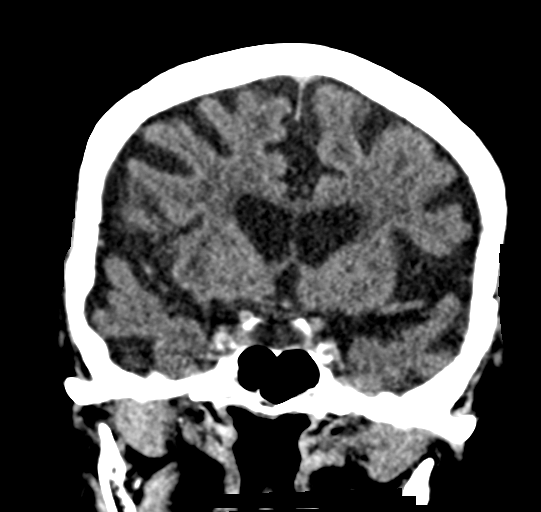
[im 38/69  brain]
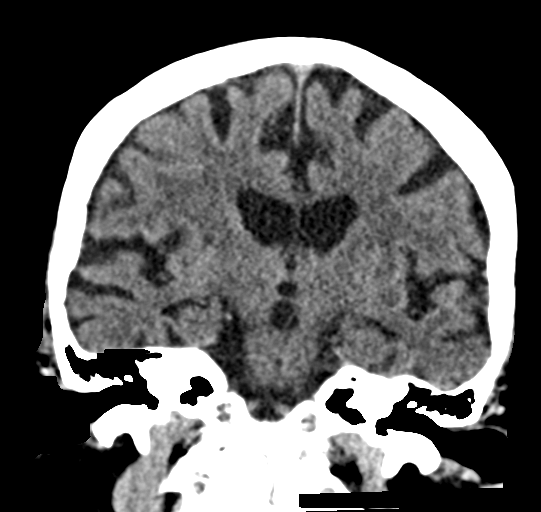

[Series 5: sag soft · sagittal · 0.31mm/px · 3 of 59 slices shown]
[im 20/59  brain]
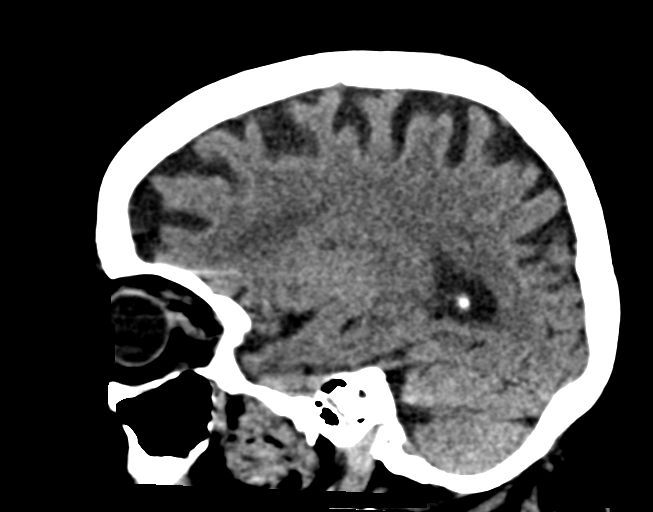
[im 30/59  brain]
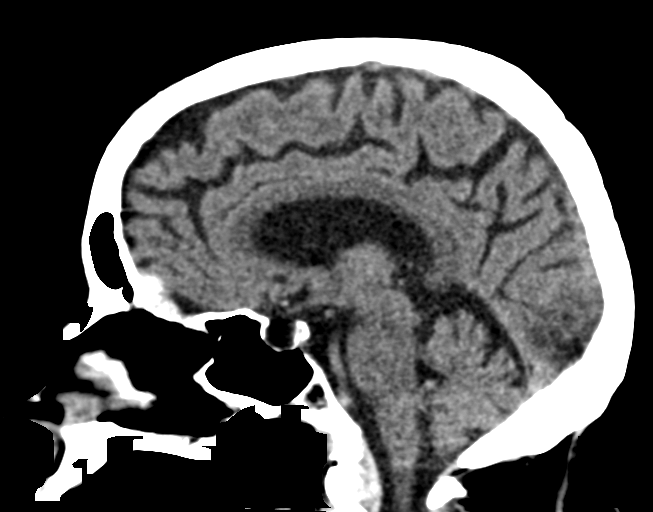
[im 39/59  brain]
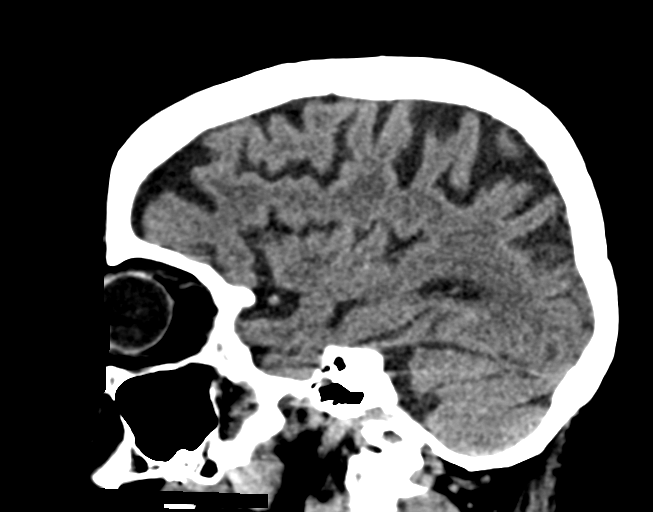

[15 of 47 positions shown; findings below may reference images not displayed]

FINDINGS: Brain: There is no evidence of an acute cortically based infarct,
intracranial hemorrhage, mass, midline shift, or extra-axial fluid
collection. A lacunar infarct in the right caudate nucleus is
chronic in appearance. Hypodensities in the cerebral white matter
bilaterally are nonspecific but compatible with mild chronic small
vessel ischemic disease. There is mild generalized cerebral atrophy.
A nonenlarged partially empty sella is noted.

Vascular: Calcified atherosclerosis at the skull base. No hyperdense
vessel.

Skull: No fracture.  Scattered subcentimeter lucent skull lesions.

Sinuses/Orbits: Paranasal sinuses and mastoid air cells are clear.
Bilateral cataract extraction.

Other: None.
IMPRESSION: 1. No evidence of acute intracranial abnormality.
2. Mild chronic small vessel ischemic disease.
3. Scattered subcentimeter lucent skull lesions, nonspecific.
Consider evaluation for multiple myeloma if clinically desired

## 2023-04-05 IMAGING — DX DG CHEST 1V PORT
1 series · 1 of 1 positions shown · non-contrast
Comparison: 10/27/2017

CLINICAL DATA: Altered mental status.  Lethargy.

EXAM:
PORTABLE CHEST 1 VIEW

[chest ap]
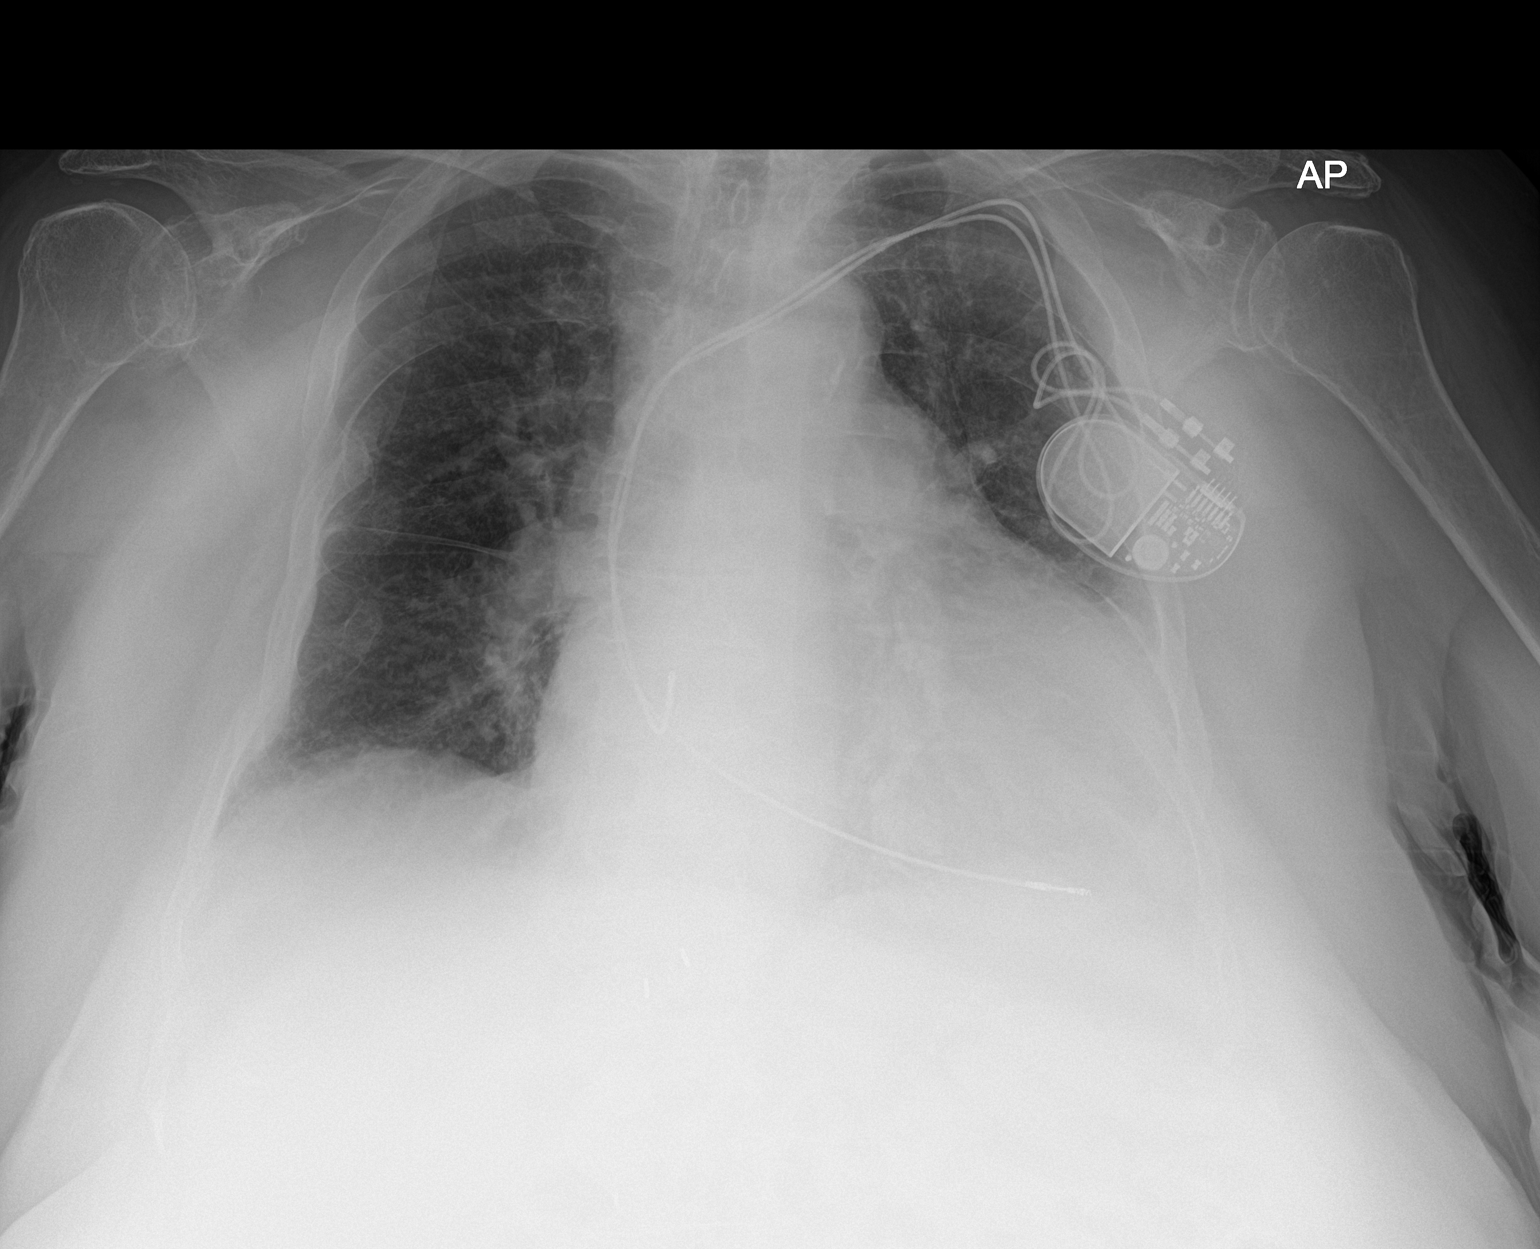

[1 of 1 positions shown; findings below may reference images not displayed]

FINDINGS: Remote right rib fractures. Pacer with leads at right atrium and
right ventricle. No lead discontinuity. Midline trachea.
Cardiomegaly accentuated by AP portable technique. Atherosclerosis
in the transverse aorta. No right-sided pleural effusion. No
pneumothorax. Inferior left hemithorax and left pleural space
suboptimally evaluated secondary to patient's size and AP portable
technique. No congestive failure. Mild right base atelectasis or
scarring.
IMPRESSION: Suboptimal evaluation of the inferior left hemithorax, secondary to
patient body habitus and AP portable technique.

Otherwise, no acute process or explanation for altered mental
status.

Cardiomegaly without congestive failure.

Aortic Atherosclerosis ([id number]).
